# Patient Record
Sex: Female | Born: 1965
Health system: Southern US, Community
[De-identification: ages and names within clinical notes are randomized; demographics above are authoritative.]

## PROBLEM LIST (undated history)

## (undated) DIAGNOSIS — N823 Fistula of vagina to large intestine: Secondary | ICD-10-CM

## (undated) DIAGNOSIS — M1990 Unspecified osteoarthritis, unspecified site: Secondary | ICD-10-CM

## (undated) DIAGNOSIS — N189 Chronic kidney disease, unspecified: Secondary | ICD-10-CM

## (undated) DIAGNOSIS — D649 Anemia, unspecified: Secondary | ICD-10-CM

## (undated) DIAGNOSIS — R569 Unspecified convulsions: Secondary | ICD-10-CM

## (undated) DIAGNOSIS — J45909 Unspecified asthma, uncomplicated: Secondary | ICD-10-CM

## (undated) DIAGNOSIS — F419 Anxiety disorder, unspecified: Secondary | ICD-10-CM

## (undated) DIAGNOSIS — I639 Cerebral infarction, unspecified: Secondary | ICD-10-CM

## (undated) DIAGNOSIS — E119 Type 2 diabetes mellitus without complications: Secondary | ICD-10-CM

## (undated) DIAGNOSIS — R0600 Dyspnea, unspecified: Secondary | ICD-10-CM

## (undated) DIAGNOSIS — R519 Headache, unspecified: Secondary | ICD-10-CM

## (undated) DIAGNOSIS — C539 Malignant neoplasm of cervix uteri, unspecified: Secondary | ICD-10-CM

## (undated) DIAGNOSIS — I10 Essential (primary) hypertension: Secondary | ICD-10-CM

## (undated) DIAGNOSIS — R51 Headache: Secondary | ICD-10-CM

## (undated) DIAGNOSIS — F32A Depression, unspecified: Secondary | ICD-10-CM

## (undated) DIAGNOSIS — F329 Major depressive disorder, single episode, unspecified: Secondary | ICD-10-CM

## (undated) DIAGNOSIS — Z86010 Personal history of colonic polyps: Secondary | ICD-10-CM

## (undated) DIAGNOSIS — I2510 Atherosclerotic heart disease of native coronary artery without angina pectoris: Secondary | ICD-10-CM

## (undated) DIAGNOSIS — R011 Cardiac murmur, unspecified: Secondary | ICD-10-CM

## (undated) HISTORY — PX: TONSILLECTOMY: SUR1361

## (undated) HISTORY — PX: CARDIAC CATHETERIZATION: SHX172

---

## 2005-03-28 ENCOUNTER — Ambulatory Visit: Payer: Self-pay | Admitting: Psychiatry

## 2005-03-28 ENCOUNTER — Inpatient Hospital Stay (HOSPITAL_COMMUNITY): Admission: RE | Admit: 2005-03-28 | Discharge: 2005-03-31 | Payer: Self-pay | Admitting: Psychiatry

## 2012-01-11 ENCOUNTER — Encounter: Payer: Self-pay | Admitting: Internal Medicine

## 2016-12-13 DIAGNOSIS — J9601 Acute respiratory failure with hypoxia: Secondary | ICD-10-CM

## 2016-12-13 DIAGNOSIS — T50901A Poisoning by unspecified drugs, medicaments and biological substances, accidental (unintentional), initial encounter: Secondary | ICD-10-CM

## 2016-12-13 DIAGNOSIS — J690 Pneumonitis due to inhalation of food and vomit: Secondary | ICD-10-CM

## 2017-02-19 ENCOUNTER — Inpatient Hospital Stay
Admission: AD | Admit: 2017-02-19 | Discharge: 2017-02-25 | DRG: 881 | Disposition: A | Discharge status: Home / Self Care | Payer: Federal, State, Local not specified - Other | Source: Other Acute Inpatient Hospital | Attending: Psychiatry | Admitting: Psychiatry

## 2017-02-19 DIAGNOSIS — E1122 Type 2 diabetes mellitus with diabetic chronic kidney disease: Secondary | ICD-10-CM | POA: Diagnosis present

## 2017-02-19 DIAGNOSIS — I10 Essential (primary) hypertension: Secondary | ICD-10-CM

## 2017-02-19 DIAGNOSIS — X838XXA Intentional self-harm by other specified means, initial encounter: Secondary | ICD-10-CM | POA: Diagnosis not present

## 2017-02-19 DIAGNOSIS — F17200 Nicotine dependence, unspecified, uncomplicated: Secondary | ICD-10-CM

## 2017-02-19 DIAGNOSIS — Z811 Family history of alcohol abuse and dependence: Secondary | ICD-10-CM | POA: Diagnosis not present

## 2017-02-19 DIAGNOSIS — I679 Cerebrovascular disease, unspecified: Secondary | ICD-10-CM | POA: Diagnosis present

## 2017-02-19 DIAGNOSIS — F329 Major depressive disorder, single episode, unspecified: Secondary | ICD-10-CM | POA: Diagnosis present

## 2017-02-19 DIAGNOSIS — Z7982 Long term (current) use of aspirin: Secondary | ICD-10-CM | POA: Diagnosis not present

## 2017-02-19 DIAGNOSIS — Z818 Family history of other mental and behavioral disorders: Secondary | ICD-10-CM

## 2017-02-19 DIAGNOSIS — F419 Anxiety disorder, unspecified: Secondary | ICD-10-CM | POA: Diagnosis present

## 2017-02-19 DIAGNOSIS — F1120 Opioid dependence, uncomplicated: Secondary | ICD-10-CM | POA: Diagnosis present

## 2017-02-19 DIAGNOSIS — F1520 Other stimulant dependence, uncomplicated: Secondary | ICD-10-CM | POA: Diagnosis present

## 2017-02-19 DIAGNOSIS — I2510 Atherosclerotic heart disease of native coronary artery without angina pectoris: Secondary | ICD-10-CM | POA: Diagnosis present

## 2017-02-19 DIAGNOSIS — T1491XA Suicide attempt, initial encounter: Secondary | ICD-10-CM | POA: Diagnosis present

## 2017-02-19 DIAGNOSIS — N189 Chronic kidney disease, unspecified: Secondary | ICD-10-CM | POA: Diagnosis present

## 2017-02-19 DIAGNOSIS — Z915 Personal history of self-harm: Secondary | ICD-10-CM

## 2017-02-19 DIAGNOSIS — F1320 Sedative, hypnotic or anxiolytic dependence, uncomplicated: Secondary | ICD-10-CM

## 2017-02-19 DIAGNOSIS — M1990 Unspecified osteoarthritis, unspecified site: Secondary | ICD-10-CM | POA: Diagnosis present

## 2017-02-19 DIAGNOSIS — Z8673 Personal history of transient ischemic attack (TIA), and cerebral infarction without residual deficits: Secondary | ICD-10-CM | POA: Diagnosis not present

## 2017-02-19 DIAGNOSIS — F4310 Post-traumatic stress disorder, unspecified: Secondary | ICD-10-CM | POA: Diagnosis present

## 2017-02-19 DIAGNOSIS — G4700 Insomnia, unspecified: Secondary | ICD-10-CM | POA: Diagnosis present

## 2017-02-19 DIAGNOSIS — Z79899 Other long term (current) drug therapy: Secondary | ICD-10-CM

## 2017-02-19 DIAGNOSIS — J45909 Unspecified asthma, uncomplicated: Secondary | ICD-10-CM | POA: Diagnosis present

## 2017-02-19 DIAGNOSIS — F17210 Nicotine dependence, cigarettes, uncomplicated: Secondary | ICD-10-CM | POA: Diagnosis present

## 2017-02-19 DIAGNOSIS — F1020 Alcohol dependence, uncomplicated: Secondary | ICD-10-CM | POA: Diagnosis present

## 2017-02-19 DIAGNOSIS — F13239 Sedative, hypnotic or anxiolytic dependence with withdrawal, unspecified: Secondary | ICD-10-CM | POA: Diagnosis present

## 2017-02-19 DIAGNOSIS — F13939 Sedative, hypnotic or anxiolytic use, unspecified with withdrawal, unspecified: Secondary | ICD-10-CM

## 2017-02-19 DIAGNOSIS — Z8669 Personal history of other diseases of the nervous system and sense organs: Secondary | ICD-10-CM

## 2017-02-19 DIAGNOSIS — Z888 Allergy status to other drugs, medicaments and biological substances status: Secondary | ICD-10-CM

## 2017-02-19 DIAGNOSIS — I129 Hypertensive chronic kidney disease with stage 1 through stage 4 chronic kidney disease, or unspecified chronic kidney disease: Secondary | ICD-10-CM | POA: Diagnosis present

## 2017-02-19 DIAGNOSIS — R51 Headache: Secondary | ICD-10-CM | POA: Diagnosis present

## 2017-02-19 DIAGNOSIS — Z882 Allergy status to sulfonamides status: Secondary | ICD-10-CM

## 2017-02-19 DIAGNOSIS — E119 Type 2 diabetes mellitus without complications: Secondary | ICD-10-CM

## 2017-02-19 HISTORY — DX: Headache: R51

## 2017-02-19 HISTORY — DX: Essential (primary) hypertension: I10

## 2017-02-19 HISTORY — DX: Cardiac murmur, unspecified: R01.1

## 2017-02-19 HISTORY — DX: Headache, unspecified: R51.9

## 2017-02-19 HISTORY — DX: Atherosclerotic heart disease of native coronary artery without angina pectoris: I25.10

## 2017-02-19 HISTORY — DX: Unspecified convulsions: R56.9

## 2017-02-19 HISTORY — DX: Cerebral infarction, unspecified: I63.9

## 2017-02-19 HISTORY — DX: Unspecified osteoarthritis, unspecified site: M19.90

## 2017-02-19 HISTORY — DX: Dyspnea, unspecified: R06.00

## 2017-02-19 HISTORY — DX: Depression, unspecified: F32.A

## 2017-02-19 HISTORY — DX: Chronic kidney disease, unspecified: N18.9

## 2017-02-19 HISTORY — DX: Major depressive disorder, single episode, unspecified: F32.9

## 2017-02-19 HISTORY — DX: Anxiety disorder, unspecified: F41.9

## 2017-02-19 HISTORY — DX: Type 2 diabetes mellitus without complications: E11.9

## 2017-02-19 HISTORY — DX: Unspecified asthma, uncomplicated: J45.909

## 2017-02-19 HISTORY — DX: Anemia, unspecified: D64.9

## 2017-02-19 LAB — GLUCOSE, CAPILLARY: Glucose-Capillary: 121 mg/dL — ABNORMAL HIGH (ref 65–99)

## 2017-02-19 MED ORDER — AMLODIPINE BESYLATE 5 MG PO TABS
2.5000 mg | ORAL_TABLET | Freq: Every day | ORAL | Status: DC
  Administered 2017-02-20: 2.5 mg via ORAL
  Filled 2017-02-19: qty 1

## 2017-02-19 MED ORDER — ALUM & MAG HYDROXIDE-SIMETH 200-200-20 MG/5ML PO SUSP
30.0000 mL | ORAL | Status: DC | PRN
  Administered 2017-02-20 – 2017-02-24 (×4): 30 mL via ORAL
  Filled 2017-02-19 (×4): qty 30

## 2017-02-19 MED ORDER — CHLORDIAZEPOXIDE HCL 25 MG PO CAPS
50.0000 mg | ORAL_CAPSULE | Freq: Three times a day (TID) | ORAL | Status: DC
  Administered 2017-02-20 – 2017-02-21 (×6): 50 mg via ORAL
  Filled 2017-02-19 (×6): qty 2

## 2017-02-19 MED ORDER — LORAZEPAM 2 MG PO TABS: 2.0000 mg | ORAL_TABLET | Freq: Three times a day (TID) | ORAL | Status: DC | PRN

## 2017-02-19 MED ORDER — TRAZODONE HCL 100 MG PO TABS
100.0000 mg | ORAL_TABLET | Freq: Every evening | ORAL | Status: DC | PRN
  Administered 2017-02-19: 100 mg via ORAL
  Filled 2017-02-19: qty 1

## 2017-02-19 MED ORDER — ASPIRIN EC 81 MG PO TBEC
81.0000 mg | DELAYED_RELEASE_TABLET | Freq: Every day | ORAL | Status: DC
  Administered 2017-02-20 – 2017-02-25 (×6): 81 mg via ORAL
  Filled 2017-02-19 (×7): qty 1

## 2017-02-19 MED ORDER — MAGNESIUM HYDROXIDE 400 MG/5ML PO SUSP
30.0000 mL | Freq: Every day | ORAL | Status: DC | PRN
  Administered 2017-02-19: 30 mL via ORAL
  Filled 2017-02-19: qty 30

## 2017-02-19 MED ORDER — GABAPENTIN 300 MG PO CAPS
300.0000 mg | ORAL_CAPSULE | Freq: Three times a day (TID) | ORAL | Status: DC
  Administered 2017-02-20 – 2017-02-21 (×5): 300 mg via ORAL
  Filled 2017-02-19 (×5): qty 1

## 2017-02-19 MED ORDER — METOPROLOL TARTRATE 25 MG PO TABS
50.0000 mg | ORAL_TABLET | Freq: Two times a day (BID) | ORAL | Status: DC
  Administered 2017-02-19 – 2017-02-25 (×12): 50 mg via ORAL
  Filled 2017-02-19 (×12): qty 2

## 2017-02-19 MED ORDER — ACETAMINOPHEN 325 MG PO TABS
650.0000 mg | ORAL_TABLET | Freq: Four times a day (QID) | ORAL | Status: DC | PRN
  Administered 2017-02-19 – 2017-02-21 (×4): 650 mg via ORAL
  Filled 2017-02-19 (×4): qty 2

## 2017-02-19 NOTE — BH Assessment (Signed)
Per Dr. Dwyane Dee, patient meets inpatient criteria and will be transferred to [facility]. This was discussed during the morning Bridge Call. Patient's information was forwarded to [facility] Attending Physician (Dr. Jerilee Hoh) and [facility] Charge Nurse [place] F.).
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
## 2017-02-19 NOTE — BH Assessment (Signed)
Patient has been accepted to [facility].  Accepting physician is Dr. Dwyane Dee.  Attending Physician will be Dr. Jerilee Hoh.  Patient has been assigned to room 323, by [place].  Call report to [phone].  Representative/Transfer Coordinator is Virgina Organ Patient pre-admitted by [facility] Patient Access Larena Glassman)   [facility] Staff Romie Minus, TTS) made aware of acceptance.

## 2017-02-20 ENCOUNTER — Encounter: Payer: Self-pay | Admitting: Psychiatry

## 2017-02-20 DIAGNOSIS — F1320 Sedative, hypnotic or anxiolytic dependence, uncomplicated: Secondary | ICD-10-CM

## 2017-02-20 DIAGNOSIS — F13239 Sedative, hypnotic or anxiolytic dependence with withdrawal, unspecified: Secondary | ICD-10-CM

## 2017-02-20 DIAGNOSIS — E119 Type 2 diabetes mellitus without complications: Secondary | ICD-10-CM

## 2017-02-20 DIAGNOSIS — F1520 Other stimulant dependence, uncomplicated: Secondary | ICD-10-CM

## 2017-02-20 DIAGNOSIS — F332 Major depressive disorder, recurrent severe without psychotic features: Secondary | ICD-10-CM

## 2017-02-20 DIAGNOSIS — F17200 Nicotine dependence, unspecified, uncomplicated: Secondary | ICD-10-CM

## 2017-02-20 DIAGNOSIS — F1020 Alcohol dependence, uncomplicated: Secondary | ICD-10-CM

## 2017-02-20 DIAGNOSIS — I10 Essential (primary) hypertension: Secondary | ICD-10-CM

## 2017-02-20 DIAGNOSIS — I679 Cerebrovascular disease, unspecified: Secondary | ICD-10-CM

## 2017-02-20 DIAGNOSIS — F1120 Opioid dependence, uncomplicated: Secondary | ICD-10-CM

## 2017-02-20 DIAGNOSIS — F13939 Sedative, hypnotic or anxiolytic use, unspecified with withdrawal, unspecified: Secondary | ICD-10-CM

## 2017-02-20 LAB — URINALYSIS, COMPLETE (UACMP) WITH MICROSCOPIC
Bacteria, UA: NONE SEEN
Bilirubin Urine: NEGATIVE
GLUCOSE, UA: NEGATIVE mg/dL
HGB URINE DIPSTICK: NEGATIVE
KETONES UR: NEGATIVE mg/dL
LEUKOCYTES UA: NEGATIVE
Nitrite: NEGATIVE
PH: 7 (ref 5.0–8.0)
PROTEIN: NEGATIVE mg/dL
RBC / HPF: NONE SEEN RBC/hpf (ref 0–5)
Specific Gravity, Urine: 1.008 (ref 1.005–1.030)

## 2017-02-20 LAB — BASIC METABOLIC PANEL
Anion gap: 10 (ref 5–15)
BUN: 14 mg/dL (ref 6–20)
CHLORIDE: 103 mmol/L (ref 101–111)
CO2: 27 mmol/L (ref 22–32)
Calcium: 9.5 mg/dL (ref 8.9–10.3)
Creatinine, Ser: 0.79 mg/dL (ref 0.44–1.00)
Glucose, Bld: 163 mg/dL — ABNORMAL HIGH (ref 65–99)
POTASSIUM: 4.1 mmol/L (ref 3.5–5.1)
Sodium: 140 mmol/L (ref 135–145)

## 2017-02-20 LAB — CBC WITH DIFFERENTIAL/PLATELET
Basophils Absolute: 0.1 10*3/uL (ref 0–0.1)
Basophils Relative: 1 %
EOS ABS: 0.1 10*3/uL (ref 0–0.7)
EOS PCT: 1 %
HCT: 43.6 % (ref 35.0–47.0)
HEMOGLOBIN: 14.7 g/dL (ref 12.0–16.0)
Lymphocytes Relative: 26 %
Lymphs Abs: 2.5 10*3/uL (ref 1.0–3.6)
MCH: 30.7 pg (ref 26.0–34.0)
MCHC: 33.8 g/dL (ref 32.0–36.0)
MCV: 90.8 fL (ref 80.0–100.0)
Monocytes Absolute: 0.5 10*3/uL (ref 0.2–0.9)
Monocytes Relative: 6 %
NEUTROS PCT: 66 %
Neutro Abs: 6.3 10*3/uL (ref 1.4–6.5)
PLATELETS: 293 10*3/uL (ref 150–440)
RBC: 4.8 MIL/uL (ref 3.80–5.20)
RDW: 13.9 % (ref 11.5–14.5)
WBC: 9.5 10*3/uL (ref 3.6–11.0)

## 2017-02-20 LAB — TSH: TSH: 0.209 u[IU]/mL — AB (ref 0.350–4.500)

## 2017-02-20 MED ORDER — AMLODIPINE BESYLATE 5 MG PO TABS
10.0000 mg | ORAL_TABLET | Freq: Every day | ORAL | Status: DC
  Administered 2017-02-21 – 2017-02-25 (×5): 10 mg via ORAL
  Filled 2017-02-20 (×5): qty 2

## 2017-02-20 MED ORDER — AMLODIPINE BESYLATE 5 MG PO TABS
7.5000 mg | ORAL_TABLET | Freq: Once | ORAL | Status: AC
  Administered 2017-02-20: 7.5 mg via ORAL
  Filled 2017-02-20: qty 2

## 2017-02-20 MED ORDER — TRAZODONE HCL 50 MG PO TABS
150.0000 mg | ORAL_TABLET | Freq: Every evening | ORAL | Status: DC | PRN
  Administered 2017-02-20 – 2017-02-21 (×2): 150 mg via ORAL
  Filled 2017-02-20 (×2): qty 1

## 2017-02-20 MED ORDER — NICOTINE 21 MG/24HR TD PT24
21.0000 mg | MEDICATED_PATCH | Freq: Every day | TRANSDERMAL | Status: DC
  Administered 2017-02-20 – 2017-02-25 (×6): 21 mg via TRANSDERMAL
  Filled 2017-02-20 (×6): qty 1

## 2017-02-20 MED ORDER — CLONIDINE HCL 0.1 MG PO TABS
0.1000 mg | ORAL_TABLET | Freq: Three times a day (TID) | ORAL | Status: DC | PRN
  Administered 2017-02-20 – 2017-02-21 (×2): 0.1 mg via ORAL
  Filled 2017-02-20 (×2): qty 1

## 2017-02-20 MED ORDER — FLUOXETINE HCL 20 MG PO CAPS
20.0000 mg | ORAL_CAPSULE | Freq: Every day | ORAL | Status: DC
  Administered 2017-02-20 – 2017-02-25 (×6): 20 mg via ORAL
  Filled 2017-02-20 (×6): qty 1

## 2017-02-20 NOTE — Plan of Care (Signed)
Problem: Safety: Goal: Ability to remain free from injury will improve Outcome: Progressing Pt injury free during hospital stay.

## 2017-02-20 NOTE — Plan of Care (Signed)
Problem: Activity: Goal: Sleeping patterns will improve Outcome: Progressing Patient slept for Estimated Hours of 7.15; every 15 minutes safety round maintained, no injury or falls during this shift.
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
## 2017-02-20 NOTE — Progress Notes (Signed)
Urine cup handed to pt .Marland Kitchen Pt stated she will urinate in cup when she can. Stated she will bring sample up to nurses station when finished.

## 2017-02-20 NOTE — Tx Team (Signed)
Initial Treatment Plan 02/20/2017 1:23 AM Kristen Murphy ACZ:[id number]    PATIENT STRESSORS: Financial difficulties Health problems Medication change or noncompliance Substance abuse   PATIENT STRENGTHS: Ability for insight Average or above average intelligence Capable of independent living Motivation for treatment/growth Supportive family/friends   PATIENT IDENTIFIED PROBLEMS: Suicidal Thoughts  Mood Instability & Bereavements  PolySubstance & Etoh Abuse  Relationship Conflicts               DISCHARGE CRITERIA:  Adequate post-discharge living arrangements Improved stabilization in mood, thinking, and/or behavior Reduction of life-threatening or endangering symptoms to within safe limits Verbal commitment to aftercare and medication compliance  PRELIMINARY DISCHARGE PLAN: Attend aftercare/continuing care group Outpatient therapy Return to previous living arrangement  PATIENT/FAMILY INVOLVEMENT: This treatment plan has been presented to and reviewed with the patient, Kristen Murphy.  The patient and family have been given the opportunity to ask questions and make suggestions.  Electa Sniff, RN 02/20/2017, 1:23 AM

## 2017-02-20 NOTE — BHH Group Notes (Signed)
[place] LCSW Group Therapy   02/20/2017  9:30 am   Type of Therapy: Group Therapy   Participation Level: Patient invited but did not attend.    Glorious Peach, MSW, LCSWA 02/20/2017, 2:13PM
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
## 2017-02-20 NOTE — Progress Notes (Signed)
Patient ID: Kristen Murphy, female   DOB: 04/18/1966, 51 y.o.   MRN: [id number] Admitted from [facility] IVC for increasing suicidal thoughts, heavy polysubstance abuse, OD on opiates recently requiring resuscitation, patient abuses Benzo and other  medications and stealing from others; still going through grief &  bereavements over the dead of 3 close relatives in the last 6 months. Skin assessment and Contraband Search completed, gross skin intact, no contrabands found on patient and in his belongings. Receptive, pleasant, no impairment in thoughts and processes, some moments of crying spells telling her story, denied SI now but have reasons, means, plans to carry out suicidal plans; patient contracted not to kill herself during this hospitalization. Unit orientation and room completed after snacks and medications.

## 2017-02-20 NOTE — Tx Team (Signed)
Interdisciplinary Treatment and Diagnostic Plan Update  02/20/2017 Time of Session: 1140 Kristen Murphy MRN: [id number]  Principal Diagnosis: MDD (major depressive disorder)  Secondary Diagnoses: Principal Problem:   MDD (major depressive disorder) Active Problems:   HTN (hypertension)   DM (diabetes mellitus) ([place])   Cerebrovascular disease   Tobacco use disorder   Opioid use disorder, severe, dependence (HCC)   Sedative, hypnotic or anxiolytic use disorder, severe, dependence (HCC)   Sedative hypnotic withdrawal ([place])   Methamphetamine use disorder, moderate (HCC)   Alcohol use disorder, moderate, dependence ([place])   Current Medications:  Current Facility-Administered Medications  Medication Dose Route Frequency Provider Last Rate Last Dose  . acetaminophen (TYLENOL) tablet 650 mg  650 mg Oral Q6H PRN Hildred Priest, MD   650 mg at 02/19/17 2126  . alum & mag hydroxide-simeth (MAALOX/MYLANTA) 200-200-20 MG/5ML suspension 30 mL  30 mL Oral Q4H PRN Hildred Priest, MD   30 mL at 02/20/17 1511  . [START ON 02/21/2017] amLODipine (NORVASC) tablet 10 mg  10 mg Oral Daily Hildred Priest, MD      . aspirin EC tablet 81 mg  81 mg Oral Daily Hildred Priest, MD   81 mg at 02/20/17 0829  . chlordiazePOXIDE (LIBRIUM) capsule 50 mg  50 mg Oral TID Hildred Priest, MD   50 mg at 02/20/17 1314  . cloNIDine (CATAPRES) tablet 0.1 mg  0.1 mg Oral TID PRN Hildred Priest, MD   0.1 mg at 02/20/17 1511  . FLUoxetine (PROZAC) capsule 20 mg  20 mg Oral Daily Hildred Priest, MD   20 mg at 02/20/17 1317  . gabapentin (NEURONTIN) capsule 300 mg  300 mg Oral TID Hildred Priest, MD   300 mg at 02/20/17 1314  . magnesium hydroxide (MILK OF MAGNESIA) suspension 30 mL  30 mL Oral Daily PRN Hildred Priest, MD   30 mL at 02/19/17 2125  . metoprolol tartrate (LOPRESSOR) tablet 50 mg  50 mg Oral BID  Hildred Priest, MD   50 mg at 02/20/17 0830  . nicotine (NICODERM CQ - dosed in mg/24 hours) patch 21 mg  21 mg Transdermal Daily Hildred Priest, MD   21 mg at 02/20/17 0907  . traZODone (DESYREL) tablet 150 mg  150 mg Oral QHS PRN Hildred Priest, MD       PTA Medications: Prescriptions Prior to Admission  Medication Sig Dispense Refill Last Dose  . amLODipine (NORVASC) 10 MG tablet Take 10 mg by mouth daily.   02/19/2017 at 1000  . aspirin EC 81 MG tablet Take 81 mg by mouth daily.   02/19/2017 at 1000  . gabapentin (NEURONTIN) 300 MG capsule Take 900 mg by mouth 3 (three) times daily.   02/19/2017 at 1200  . metoprolol tartrate (LOPRESSOR) 100 MG tablet Take 100 mg by mouth 2 (two) times daily.   02/19/2017 at 1000    Patient Stressors: Financial difficulties Health problems Medication change or noncompliance Substance abuse  Patient Strengths: Ability for insight Average or above average intelligence Capable of independent living Motivation for treatment/growth Supportive family/friends  Treatment Modalities: Medication Management, Group therapy, Case management,  1 to 1 session with clinician, Psychoeducation, Recreational therapy.   Physician Treatment Plan for Primary Diagnosis: MDD (major depressive disorder) Long Term Goal(s): Improvement in symptoms so as ready for discharge Improvement in symptoms so as ready for discharge   Short Term Goals: Ability to identify changes in lifestyle to reduce recurrence of condition will improve Ability to verbalize feelings will improve Ability  to demonstrate self-control will improve Ability to identify and develop effective coping behaviors will improve Ability to identify triggers associated with substance abuse/mental health issues will improve Compliance with prescribed medications will improve  Medication Management: Evaluate patient's response, side effects, and tolerance of medication  regimen.  Therapeutic Interventions: 1 to 1 sessions, Unit Group sessions and Medication administration.  Evaluation of Outcomes: Not Met  Physician Treatment Plan for Secondary Diagnosis: Principal Problem:   MDD (major depressive disorder) Active Problems:   HTN (hypertension)   DM (diabetes mellitus) ([place])   Cerebrovascular disease   Tobacco use disorder   Opioid use disorder, severe, dependence (HCC)   Sedative, hypnotic or anxiolytic use disorder, severe, dependence ([place])   Sedative hypnotic withdrawal ([place])   Methamphetamine use disorder, moderate (HCC)   Alcohol use disorder, moderate, dependence ([place])  Long Term Goal(s): Improvement in symptoms so as ready for discharge Improvement in symptoms so as ready for discharge   Short Term Goals: Ability to identify changes in lifestyle to reduce recurrence of condition will improve Ability to verbalize feelings will improve Ability to demonstrate self-control will improve Ability to identify and develop effective coping behaviors will improve Ability to identify triggers associated with substance abuse/mental health issues will improve Compliance with prescribed medications will improve     Medication Management: Evaluate patient's response, side effects, and tolerance of medication regimen.  Therapeutic Interventions: 1 to 1 sessions, Unit Group sessions and Medication administration.  Evaluation of Outcomes: Not Met   RN Treatment Plan for Primary Diagnosis: MDD (major depressive disorder) Long Term Goal(s): Knowledge of disease and therapeutic regimen to maintain health will improve  Short Term Goals: Ability to participate in decision making will improve, Ability to identify and develop effective coping behaviors will improve and Compliance with prescribed medications will improve  Medication Management: RN will administer medications as ordered by provider, will assess and evaluate patient's response and provide  education to patient for prescribed medication. RN will report any adverse and/or side effects to prescribing provider.  Therapeutic Interventions: 1 on 1 counseling sessions, Psychoeducation, Medication administration, Evaluate responses to treatment, Monitor vital signs and CBGs as ordered, Perform/monitor CIWA, COWS, AIMS and Fall Risk screenings as ordered, Perform wound care treatments as ordered.  Evaluation of Outcomes: Not Met   LCSW Treatment Plan for Primary Diagnosis: MDD (major depressive disorder) Long Term Goal(s): Safe transition to appropriate next level of care at discharge, Engage patient in therapeutic group addressing interpersonal concerns.  Short Term Goals: Engage patient in aftercare planning with referrals and resources and Increase skills for wellness and recovery  Therapeutic Interventions: Assess for all discharge needs, 1 to 1 time with Social worker, Explore available resources and support systems, Assess for adequacy in community support network, Educate family and significant other(s) on suicide prevention, Complete Psychosocial Assessment, Interpersonal group therapy.  Evaluation of Outcomes: Not Met   Progress in Treatment: Attending groups: No. Participating in groups: No. Taking medication as prescribed: Yes. Toleration medication: Yes. Family/Significant other contact made: No, will contact:  sister Patient understands diagnosis: Yes. Discussing patient identified problems/goals with staff: Yes. Medical problems stabilized or resolved: Yes. Denies suicidal/homicidal ideation: Yes. Issues/concerns per patient self-inventory: No. Other: none  New problem(s) identified: No, Describe:  none  New Short Term/Long Term Goal(s): Pt goal is "to feel better about myself"  Discharge Plan or Barriers: Pt will be referred to outpatient services at [facility].  Reason for Continuation of Hospitalization: Depression Medication  stabilization  Estimated Length of Stay:  3-5 days.  Attendees: Patient:Kristen Murphy 02/20/2017   Physician: Dr. Jerilee Hoh, MD 02/20/2017   Nursing: Polly Cobia, RN 02/20/2017   RN Care Manager: 02/20/2017   Social Worker: Lurline Idol, LCSW 02/20/2017   Recreational Therapist:  02/20/2017   Other:  02/20/2017   Other:  02/20/2017   Other: 02/20/2017        Scribe for Treatment Team: Joanne Chars, [place] 02/20/2017 3:31 PM

## 2017-02-20 NOTE — Progress Notes (Signed)
This morning pt was tearful pt stated, " I was just thinking about my child's father who recently passed away. After he left this world I lost it, I keep asking God why he didn't take me too".Pt reports having depression feeling hopeless because her loved ones has passed away. She reports that made her relapse on drugs. Pt denies current SI, HI, a/v hallucinations. Commits to safety on unit. Pt is being monitored for withdrawal symptoms and hypertension. Pt compliant with all scheduled meds given prn clonidine BP 165/74 75 pulse. Will continue to monitor.

## 2017-02-20 NOTE — BHH Suicide Risk Assessment (Signed)
[facility] Admission Suicide Risk Assessment   Nursing information obtained from:  Patient, Review of record Demographic factors:  Divorced or widowed, Caucasian, Low socioeconomic status, Unemployed Current Mental Status:  Suicide plan, Intention to act on suicide plan, Belief that plan would result in death Loss Factors:  Loss of significant relationship, Financial problems / change in socioeconomic status Historical Factors:  Prior suicide attempts, Family history of mental illness or substance abuse Risk Reduction Factors:  Positive therapeutic relationship  Total Time spent with patient: 1 hour Principal Problem: MDD (major depressive disorder) Diagnosis:   Patient Active Problem List   Diagnosis Date Noted  . HTN (hypertension) [I10] 02/20/2017  . DM (diabetes mellitus) ([place]) [E11.9] 02/20/2017  . Cerebrovascular disease [I67.9] 02/20/2017  . Tobacco use disorder [F17.200] 02/20/2017  . Opioid use disorder, severe, dependence ([place]) [F11.20] 02/20/2017  . Sedative, hypnotic or anxiolytic use disorder, severe, dependence ([place]) [F13.20] 02/20/2017  . Sedative hypnotic withdrawal ([place]) [F13.239] 02/20/2017  . Methamphetamine use disorder, moderate ([place]) [F15.20] 02/20/2017  . Alcohol use disorder, moderate, dependence ([place]) [F10.20] 02/20/2017  . MDD (major depressive disorder) [F32.9] 02/19/2017   Subjective Data:   Continued Clinical Symptoms:  Alcohol Use Disorder Identification Test Final Score (AUDIT): 23 The "Alcohol Use Disorders Identification Test", Guidelines for Use in Primary Care, Second Edition.  World Pharmacologist [facility]). Score between 0-7:  no or low risk or alcohol related problems. Score between 8-15:  moderate risk of alcohol related problems. Score between 16-19:  high risk of alcohol related problems. Score 20 or above:  warrants further diagnostic evaluation for alcohol dependence and treatment.   CLINICAL FACTORS:   Severe Anxiety and/or  Agitation Alcohol/Substance Abuse/Dependencies More than one psychiatric diagnosis Previous Psychiatric Diagnoses and Treatments      Psychiatric Specialty Exam: Physical Exam  ROS  Blood pressure (!) 173/94, pulse 71, temperature 97.9 F (36.6 C), temperature source Oral, resp. rate 16, height 5\' 2"  (1.575 m), weight 82.6 kg (182 lb), SpO2 98 %.Body mass index is 33.29 kg/m.                                                    Sleep:  Number of Hours: 7.15      COGNITIVE FEATURES THAT CONTRIBUTE TO RISK:  Closed-mindedness    SUICIDE RISK:   Moderate:  Frequent suicidal ideation with limited intensity, and duration, some specificity in terms of plans, no associated intent, good self-control, limited dysphoria/symptomatology, some risk factors present, and identifiable protective factors, including available and accessible social support.  PLAN OF CARE: admit to [facility]  I certify that inpatient services furnished can reasonably be expected to improve the patient's condition.   Hildred Priest, MD 02/20/2017, 12:38 PM

## 2017-02-21 MED ORDER — GABAPENTIN 400 MG PO CAPS
400.0000 mg | ORAL_CAPSULE | Freq: Three times a day (TID) | ORAL | Status: DC
  Administered 2017-02-21 – 2017-02-25 (×11): 400 mg via ORAL
  Filled 2017-02-21 (×11): qty 1

## 2017-02-21 MED ORDER — CLONIDINE HCL 0.1 MG PO TABS
0.1000 mg | ORAL_TABLET | Freq: Two times a day (BID) | ORAL | Status: DC
  Administered 2017-02-21 – 2017-02-25 (×8): 0.1 mg via ORAL
  Filled 2017-02-21 (×8): qty 1

## 2017-02-21 NOTE — BHH Suicide Risk Assessment (Addendum)
[place] INPATIENT:  Family/Significant Other Suicide Prevention Education  Suicide Prevention Education:  Contact Attempts: Johny Blamer, daughter, [phone], has been identified by the patient as the family member/significant other with whom the patient will be residing, and identified as the person(s) who will aid the patient in the event of a mental health crisis.  With written consent from the patient, two attempts were made to provide suicide prevention education, prior to and/or following the patient's discharge.  We were unsuccessful in providing suicide prevention education.  A suicide education pamphlet was given to the patient to share with family/significant other.  Date and time of first attempt:02/21/17, 59 Date and time of second attempt:02/22/17, 1052  Joanne Chars, LCSW 02/21/2017, 2:33 PM

## 2017-02-21 NOTE — Plan of Care (Signed)
Problem: Coping: Goal: Ability to verbalize frustrations and anger appropriately will improve Outcome: Progressing Patient is able to verbalize feelings to staff.

## 2017-02-21 NOTE — Progress Notes (Signed)
Pt appears bright, happy this morning. Voices optimism regarding the day stating that she feels much better, less depressed today. Reports good sleep last night with the help of sleep medication, good appetite, normal energy, good concentration. Rates depression 2/10, hopelessness 1/10, anxiety 7.5/10 (low 0-10 high). Denies SI/HI/AVH. Complained of headache with relief from taking tylenol and clonidine PRN as ordered. Goal today is "to have an even better day today than yesterday, focus on coping skills" by "go to groups, talk with counselor and doctor." Attends group. Appropriately interacts with staff/peers. Medication compliant. Support and encouragement provided. Medications administered as ordered with education. Safety maintained with every 15 minute checks. Will continue to monitor.

## 2017-02-21 NOTE — Progress Notes (Signed)
D: Pt denies SI/HI/AVH. Pt is pleasant and cooperative with treatment plan, affect is bright, thoughts are organized no distress noted. Patient  is interacting with peers and staff appropriately.  A: Pt was offered support and encouragement. Pt was given scheduled medications. Pt was encouraged to attend groups. Q 15 minute checks were done for safety.  R:Pt attends groups and interacts well with peers and staff. Pt is taking medication. Pt has no complaints.Pt receptive to treatment and safety maintained on unit.

## 2017-02-21 NOTE — BHH Group Notes (Signed)
[place] Group Notes:  (Nursing/MHT/Case Management/Adjunct)  Date:  02/21/2017  Time:  4:01 AM  Type of Therapy:  Psychoeducational Skills  Participation Level:  Active  Participation Quality:  Appropriate, Attentive, Sharing and Supportive  Affect:  Anxious and Excited  Cognitive:  Alert, Appropriate and Oriented  Insight:  Good  Engagement in Group:  Engaged and Supportive  Modes of Intervention:  Discussion and Exploration  Summary of Progress/Problems:  Kathi Ludwig 02/21/2017, 4:01 AM

## 2017-02-21 NOTE — BHH Group Notes (Signed)
[place] Group Notes:  (Nursing/MHT/Case Management/Adjunct)  Date:  02/21/2017  Time:  10:40 PM  Type of Therapy:  Group Therapy  Participation Level:  Active  Participation Quality:  Appropriate  Affect:  Appropriate  Cognitive:  Appropriate  Insight:  Appropriate  Engagement in Group:  Engaged  Modes of Intervention:  Discussion  Summary of Progress/Problems:  Kandis Fantasia 02/21/2017, 10:40 PM

## 2017-02-21 NOTE — Plan of Care (Signed)
Problem: Physical Regulation: Goal: Complications related to the disease process, condition or treatment will be avoided or minimized Outcome: Progressing No withdrawal symptoms reported or observed today. Librium given as ordered. Education provided.

## 2017-02-21 NOTE — BHH Group Notes (Signed)
[facility] [place] LCSW Group Therapy  02/21/2017 2:51 PM  Type of Therapy:  Group Therapy  Participation Level:  Active  Participation Quality:  Appropriate and Sharing  Affect:  Appropriate  Cognitive:  Appropriate  Insight:  Developing/Improving  Engagement in Therapy:  Developing/Improving  Modes of Intervention:  Activity, Discussion, Education, Problem-solving, Reality Testing, Socialization and Support  Summary of Progress/Problems: Balance in life: Patients will discuss the concept of balance and how it looks and feels to be unbalanced. Pt will identify areas in their life that is unbalanced and ways to become more balanced. They discussed what aspects in their lives has influenced their self care. Patients also discussed self care in the areas of self regulation/control, hygiene/appearance, sleep/relaxation, healthy leisure, healthy eating habits, exercise, inner peace/spirituality, self improvement, sobriety, and health management. They were challenged to identify changes that are needed in order to improve self care.  Kemba Hoppes G. [place], [place] 02/21/2017, 2:51 PM

## 2017-02-21 NOTE — Progress Notes (Addendum)
[facility] MD Progress Note  02/21/2017 12:58 PM Bronda LOELLA HICKLE  MRN:  [id number] Subjective:   Patient is a 51 year old Caucasian female with history of major depressive disorder and substance abuse. She presented voluntarily to [facility] emergency department last week due to suicidality.  Patient reports that she has severe addiction to benzodiazepines, opiates alcohol and methamphetamines for more than 30 years. Over the last 4 years she managed to stay sober. She unfortunately relapsed in November 2017. At that time she broke up with her boyfriend because her boyfriend was smoking methamphetamines. They have been together for 6 years and he asked her to move out of the house. The patient had to move in with her aunt who apparently is prescribed with alprazolam. The patient said that she's been through a lot since last year. She had multiple deaths in her family and with friends who overdosed on opiates. She says her best friend die in February of an overdose. The father of her older children passed away recently as well. She says that he was the love of her life. Her mother had multiple strokes in February. She says that since then she's been feeling more depressed, she relapsed on drugs and now she is feeling very guilty about the relapse, she feels hopeless and helpless, she does not eat much or sleep well. She said the last week she started having thoughts about suicide and was planning on overdosing on benzodiazepines, opiates and alcohol. She says she consumed large amounts of for this drugs but nothing happened to her.  5/31 patient says she feels much better. She likes taking clonidine for anxiety. She is requesting to have the clonidine as an scheduled medication for her blood pressure and to manage her anxiety. She says she has been is sleeping well, her mood is much improved, she denies suicidality, homicidality. She denies having major problems with sleep, appetite, energy or concentration. She  feels that the withdrawals are well-controlled with Librium.  Per nursing: D: Pt denies SI/HI/AVH. Pt is pleasant and cooperative with treatment plan, affect is bright, thoughts are organized no distress noted. Patient  is interacting with peers and staff appropriately.  A: Pt was offered support and encouragement. Pt was given scheduled medications. Pt was encouraged to attend groups. Q 15 minute checks were done for safety.  R:Pt attends groups and interacts well with peers and staff. Pt is taking medication. Pt has no complaints.Pt receptive to treatment and safety maintained on unit.  Principal Problem: MDD (major depressive disorder) Diagnosis:   Patient Active Problem List   Diagnosis Date Noted  . HTN (hypertension) [I10] 02/20/2017  . DM (diabetes mellitus) ([place]) [E11.9] 02/20/2017  . Cerebrovascular disease [I67.9] 02/20/2017  . Tobacco use disorder [F17.200] 02/20/2017  . Opioid use disorder, severe, dependence ([place]) [F11.20] 02/20/2017  . Sedative, hypnotic or anxiolytic use disorder, severe, dependence ([place]) [F13.20] 02/20/2017  . Sedative hypnotic withdrawal ([place]) [F13.239] 02/20/2017  . Methamphetamine use disorder, moderate ([place]) [F15.20] 02/20/2017  . Alcohol use disorder, moderate, dependence ([place]) [F10.20] 02/20/2017  . MDD (major depressive disorder) [F32.9] 02/19/2017   Total Time spent with patient: 30 minutes  Past Psychiatric History: She has been hospitalized twice before. One time in the 90s at behavioral health in [place] and in 2014 at [facility] she denies any suicidal attempts  Past Medical History:  Past Medical History:  Diagnosis Date  . Anemia   . Anxiety   . Arthritis   . Asthma   . Chronic  kidney disease   . Coronary artery disease   . Depression   . Diabetes mellitus without complication ([place])   . Dyspnea   . Headache   . Heart murmur   . Hypertension   . Seizures ([place])   . Stroke [facility])     Past Surgical History:  Procedure  Laterality Date  . TONSILLECTOMY     Family History: History reviewed. No pertinent family history.  Family Psychiatric  History: Reports that her father mother were alcoholics. Says that her father had manic depression. Apparently her maternal grandfather committed suicide  Social History: Patient has 3 adult children and 2 grandchildren. She is close to her children. She used to work as a Engineer, structural. She is now applying for disability. Denies having any legal trouble in the past. She is single never married History  Alcohol use Not on file     History  Drug Use  . Types: Cocaine, Benzodiazepines, Amphetamines, "Crack" cocaine, Marijuana, Opium, Methylphenidate, Heroin    Social History   Social History  . Marital status: Married    Spouse name: N/A  . Number of children: N/A  . Years of education: N/A   Social History Main Topics  . Smoking status: Current Every Day Smoker    Packs/day: 3.00    Years: 38.00  . Smokeless tobacco: Never Used  . Alcohol use None  . Drug use: Yes    Types: Cocaine, Benzodiazepines, Amphetamines, "Crack" cocaine, Marijuana, Opium, Methylphenidate, Heroin  . Sexual activity: No   Other Topics Concern  . None   Social History Narrative  . None   Additional Social History:        Current Medications: Current Facility-Administered Medications  Medication Dose Route Frequency Provider Last Rate Last Dose  . acetaminophen (TYLENOL) tablet 650 mg  650 mg Oral Q6H PRN Hildred Priest, MD   650 mg at 02/21/17 0957  . alum & mag hydroxide-simeth (MAALOX/MYLANTA) 200-200-20 MG/5ML suspension 30 mL  30 mL Oral Q4H PRN Hildred Priest, MD   30 mL at 02/20/17 1511  . amLODipine (NORVASC) tablet 10 mg  10 mg Oral Daily Hildred Priest, MD   10 mg at 02/21/17 2202  . aspirin EC tablet 81 mg  81 mg Oral Daily Hildred Priest, MD   81 mg at 02/21/17 5427  . chlordiazePOXIDE (LIBRIUM) capsule 50 mg  50 mg  Oral TID Hildred Priest, MD   50 mg at 02/21/17 1130  . cloNIDine (CATAPRES) tablet 0.1 mg  0.1 mg Oral BID Hildred Priest, MD      . FLUoxetine (PROZAC) capsule 20 mg  20 mg Oral Daily Hildred Priest, MD   20 mg at 02/21/17 0623  . gabapentin (NEURONTIN) capsule 400 mg  400 mg Oral TID Hildred Priest, MD      . magnesium hydroxide (MILK OF MAGNESIA) suspension 30 mL  30 mL Oral Daily PRN Hildred Priest, MD   30 mL at 02/19/17 2125  . metoprolol tartrate (LOPRESSOR) tablet 50 mg  50 mg Oral BID Hildred Priest, MD   50 mg at 02/21/17 7628  . nicotine (NICODERM CQ - dosed in mg/24 hours) patch 21 mg  21 mg Transdermal Daily Hildred Priest, MD   21 mg at 02/21/17 0814  . traZODone (DESYREL) tablet 150 mg  150 mg Oral QHS PRN Hildred Priest, MD   150 mg at 02/20/17 2209    Lab Results:  Results for orders placed or performed during the hospital encounter of 02/19/17 (from  the past 48 hour(s))  Glucose, capillary     Status: Abnormal   Collection Time: 02/19/17  8:42 PM  Result Value Ref Range   Glucose-Capillary 121 (H) 65 - 99 mg/dL  CBC with Differential/Platelet     Status: None   Collection Time: 02/20/17 12:44 PM  Result Value Ref Range   WBC 9.5 3.6 - 11.0 K/uL   RBC 4.80 3.80 - 5.20 MIL/uL   Hemoglobin 14.7 12.0 - 16.0 g/dL   HCT 43.6 35.0 - 47.0 %   MCV 90.8 80.0 - 100.0 fL   MCH 30.7 26.0 - 34.0 pg   MCHC 33.8 32.0 - 36.0 g/dL   RDW 13.9 11.5 - 14.5 %   Platelets 293 150 - 440 K/uL   Neutrophils Relative % 66 %   Neutro Abs 6.3 1.4 - 6.5 K/uL   Lymphocytes Relative 26 %   Lymphs Abs 2.5 1.0 - 3.6 K/uL   Monocytes Relative 6 %   Monocytes Absolute 0.5 0.2 - 0.9 K/uL   Eosinophils Relative 1 %   Eosinophils Absolute 0.1 0 - 0.7 K/uL   Basophils Relative 1 %   Basophils Absolute 0.1 0 - 0.1 K/uL  Basic metabolic panel     Status: Abnormal   Collection Time: 02/20/17 12:44 PM   Result Value Ref Range   Sodium 140 135 - 145 mmol/L   Potassium 4.1 3.5 - 5.1 mmol/L   Chloride 103 101 - 111 mmol/L   CO2 27 22 - 32 mmol/L   Glucose, Bld 163 (H) 65 - 99 mg/dL   BUN 14 6 - 20 mg/dL   Creatinine, Ser 0.79 0.44 - 1.00 mg/dL   Calcium 9.5 8.9 - 10.3 mg/dL   GFR calc non Af Amer >60 >60 mL/min   GFR calc Af Amer >60 >60 mL/min    Comment: (NOTE) The eGFR has been calculated using the CKD EPI equation. This calculation has not been validated in all clinical situations. eGFR's persistently <60 mL/min signify possible Chronic Kidney Disease.    Anion gap 10 5 - 15  TSH     Status: Abnormal   Collection Time: 02/20/17 12:44 PM  Result Value Ref Range   TSH 0.209 (L) 0.350 - 4.500 uIU/mL    Comment: Performed by a 3rd Generation assay with a functional sensitivity of <=0.01 uIU/mL.  Urinalysis, Complete w Microscopic     Status: Abnormal   Collection Time: 02/20/17  9:10 PM  Result Value Ref Range   Color, Urine STRAW (A) YELLOW   APPearance CLEAR (A) CLEAR   Specific Gravity, Urine 1.008 1.005 - 1.030   pH 7.0 5.0 - 8.0   Glucose, UA NEGATIVE NEGATIVE mg/dL   Hgb urine dipstick NEGATIVE NEGATIVE   Bilirubin Urine NEGATIVE NEGATIVE   Ketones, ur NEGATIVE NEGATIVE mg/dL   Protein, ur NEGATIVE NEGATIVE mg/dL   Nitrite NEGATIVE NEGATIVE   Leukocytes, UA NEGATIVE NEGATIVE   RBC / HPF NONE SEEN 0 - 5 RBC/hpf   WBC, UA 0-5 0 - 5 WBC/hpf   Bacteria, UA NONE SEEN NONE SEEN   Squamous Epithelial / LPF 0-5 (A) NONE SEEN    Blood Alcohol level:  No results found for: [facility]  Metabolic Disorder Labs: No results found for: HGBA1C, MPG No results found for: PROLACTIN No results found for: CHOL, TRIG, HDL, CHOLHDL, VLDL, LDLCALC  Physical Findings: AIMS:  , ,  ,  ,    CIWA:  CIWA-Ar Total: 0 COWS:  Musculoskeletal: Strength & Muscle Tone: within normal limits Gait & Station: normal Patient leans: N/A  Psychiatric Specialty Exam: Physical Exam   Constitutional: She is oriented to person, place, and time. She appears well-developed and well-nourished.  HENT:  Head: Normocephalic and atraumatic.  Eyes: EOM are normal.  Neck: Normal range of motion.  Respiratory: Effort normal.  Musculoskeletal: Normal range of motion.  Neurological: She is alert and oriented to person, place, and time.    Review of Systems  Constitutional: Negative.   HENT: Negative.   Eyes: Negative.   Respiratory: Negative.   Cardiovascular: Negative.   Gastrointestinal: Negative.   Genitourinary: Negative.   Musculoskeletal: Negative.   Skin: Negative.   Neurological: Negative.   Endo/Heme/Allergies: Negative.   Psychiatric/Behavioral: Positive for depression and substance abuse.    Blood pressure (!) 148/71, pulse 66, temperature 97.6 F (36.4 C), temperature source Oral, resp. rate 18, height _0  (1.575 m), weight 82.6 kg (182 lb), SpO2 98 %.Body mass index is 33.29 kg/m.  General Appearance: Well Groomed  Eye Contact:  Good  Speech:  Clear and Coherent  Volume:  Normal  Mood:  Euthymic  Affect:  Appropriate and Congruent  Thought Process:  Linear and Descriptions of Associations: Intact  Orientation:  Full (Time, Place, and Person)  Thought Content:  Hallucinations: None  Suicidal Thoughts:  No  Homicidal Thoughts:  No  Memory:  Immediate;   Good Recent;   Good Remote;   Good  Judgement:  Fair  Insight:  Fair  Psychomotor Activity:  Normal  Concentration:  Concentration: Good and Attention Span: Good  Recall:  Good  Fund of Knowledge:  Good  Language:  Good  Akathisia:  No  Handed:    AIMS (if indicated):     Assets:  Housing Physical Health Social Support  ADL's:  Intact  Cognition:  WNL  Sleep:  Number of Hours: 6.5     Treatment Plan Summary:  Patient is a 51 year old Caucasian female with major depressive disorder, PTSD and addiction. She presents to [place] after a suicidal attempt in the setting of relapsing have multiple  severe psychosocial stressors in her life  Major depressive disorder: continue  fluoxetine 20 mg a day  Anxiety patient is on gabapentin which will be increase to 429m 3 times a day  For insomnia: continue trazodone 150 mg by mouth daily at bedtime  Benzodiazepine withdrawal patient is currently on a Librium taper  Benzodiazepine, alcohol, methamphetamines, opiates use disorder severe: Patient states that she is not able to go to inpatient substance abuse treatment at this time as she needs to take care of her mother who suffer multiple strokes in February. She was to be discharged back to her mother's house and plans to [facility]  Hypertension the patient will be continued on Norvasc and metoprolol. Will add clonidine 0.1 mg po bid  Diabetes will check hemoglobin A1c  Cerebrovascular disease: continue managing BP  Tobacco use disorder: continue  nicotine patch 21 mg a day  Labs: will recheck TSH, T3 and T4 as TSH was low. Pending HbA1c  Disposition back to [facility]  Follow-up [facility]  Will likely d/c early next week  HHildred Priest MD 02/21/2017, 12:58 PM

## 2017-02-21 NOTE — BHH Group Notes (Signed)
[place] LCSW Group Therapy Note  Type of Therapy and Topic:  Group Therapy:  Goals Group: SMART Goals  Participation Level:  Patient attended group on this date. Patient participated in goal setting and was able to share openly with the group.   Description of Group:   The purpose of a daily goals group is to assist and guide patients in setting recovery/wellness-related goals.  The objective is to set goals as they relate to the crisis in which they were admitted. Patients will be using SMART goal modalities to set measurable goals.  Characteristics of realistic goals will be discussed and patients will be assisted in setting and processing how one will reach their goal. Facilitator will also assist patients in applying interventions and coping skills learned in psycho-education groups to the SMART goal and process how one will achieve defined goal.  Therapeutic Goals: -Patients will develop and document one goal related to or their crisis in which brought them into treatment. -Patients will be guided by LCSW using SMART goal setting modality in how to set a measurable, attainable, realistic and time sensitive goal.  -Patients will process barriers in reaching goal. -Patients will process interventions in how to overcome and successful in reaching goal.   Summary of Patient Progress:  Patient Goal: "I want to develop to new coping skills by the end of the day".    Therapeutic Modalities:   Motivational Interviewing Public relations account executive Therapy Crisis Intervention Model SMART goals setting  Jerman Tinnon G. [place], [facility] 02/21/2017 10:32 AM

## 2017-02-22 LAB — HEMOGLOBIN A1C
Hgb A1c MFr Bld: 6.8 % — ABNORMAL HIGH (ref 4.8–5.6)
MEAN PLASMA GLUCOSE: 148 mg/dL

## 2017-02-22 MED ORDER — AMLODIPINE BESYLATE 10 MG PO TABS: 10.0000 mg | ORAL_TABLET | Freq: Every day | ORAL | 0 refills | Status: DC

## 2017-02-22 MED ORDER — FLUOXETINE HCL 20 MG PO CAPS: 20.0000 mg | ORAL_CAPSULE | Freq: Every day | ORAL | 0 refills | Status: DC

## 2017-02-22 MED ORDER — CLONIDINE HCL 0.1 MG PO TABS: 0.1000 mg | ORAL_TABLET | Freq: Two times a day (BID) | ORAL | 0 refills | Status: DC

## 2017-02-22 MED ORDER — METFORMIN HCL 500 MG PO TABS
500.0000 mg | ORAL_TABLET | Freq: Two times a day (BID) | ORAL | Status: DC
  Administered 2017-02-22 – 2017-02-25 (×7): 500 mg via ORAL
  Filled 2017-02-22 (×7): qty 1

## 2017-02-22 MED ORDER — ASPIRIN 81 MG PO TBEC: 81.0000 mg | DELAYED_RELEASE_TABLET | Freq: Every day | ORAL | 0 refills | Status: DC

## 2017-02-22 MED ORDER — METFORMIN HCL 500 MG PO TABS: 500.0000 mg | ORAL_TABLET | Freq: Two times a day (BID) | ORAL | 0 refills | Status: DC

## 2017-02-22 MED ORDER — ACETAMINOPHEN 500 MG PO TABS: 1000.0000 mg | ORAL_TABLET | Freq: Four times a day (QID) | ORAL | Status: DC | PRN

## 2017-02-22 MED ORDER — GABAPENTIN 400 MG PO CAPS: 800.0000 mg | ORAL_CAPSULE | Freq: Two times a day (BID) | ORAL | 0 refills | Status: DC

## 2017-02-22 MED ORDER — METOPROLOL TARTRATE 50 MG PO TABS: 50.0000 mg | ORAL_TABLET | Freq: Two times a day (BID) | ORAL | 0 refills | Status: DC

## 2017-02-22 MED ORDER — IBUPROFEN 800 MG PO TABS
800.0000 mg | ORAL_TABLET | Freq: Three times a day (TID) | ORAL | Status: DC | PRN
Start: 2017-02-22 — End: 2017-02-25
  Administered 2017-02-22 – 2017-02-25 (×4): 800 mg via ORAL
  Filled 2017-02-22 (×4): qty 1

## 2017-02-22 MED ORDER — CHLORDIAZEPOXIDE HCL 25 MG PO CAPS
25.0000 mg | ORAL_CAPSULE | Freq: Four times a day (QID) | ORAL | Status: DC
  Administered 2017-02-22 – 2017-02-25 (×12): 25 mg via ORAL
  Filled 2017-02-22 (×13): qty 1

## 2017-02-22 MED ORDER — TRAZODONE HCL 150 MG PO TABS: 150.0000 mg | ORAL_TABLET | Freq: Every day | ORAL | 0 refills | Status: DC

## 2017-02-22 MED ORDER — TRAZODONE HCL 50 MG PO TABS
150.0000 mg | ORAL_TABLET | Freq: Every day | ORAL | Status: DC
  Administered 2017-02-22 – 2017-02-24 (×3): 150 mg via ORAL
  Filled 2017-02-22 (×3): qty 1

## 2017-02-22 NOTE — Progress Notes (Signed)
Recreation Therapy Notes  Date: 06.01.18 Time: 1:00 pm Location: Craft Room  Group Topic: Social Skills  Goal Area(s) Addresses:  Patient will effectively work with peers towards shared goal. Patient will identify skill used to make activity successful. Patient will identify how skills used during activity can be used to reach post d/c. goals.  Behavioral Response: Attentive, Interactive  Intervention: Life Boat  Activity: Patients were given a scenario that we were in [place] and we decided to get on a boat and explore. Patients were given a list of 16 people Jerline Pain, nurse, bus driver, etc.) who were on the boat exploring with [place]. While we were exploring, the boat sprung a leak and the patients were put in charge of where to put people. There are two life boats. One is bigger and faster that holds everyone in the room plus 8 people on the list and there is a raft that holds 8 people.  Education: LRT educated patients on healthy support systems.  Education Outcome: Acknowledges education/In group clarification offered  Clinical Observations/Feedback: Patient worked with peer towards shared goal. Patient used effective communication, problem solving, and Scientist, clinical (histocompatibility and immunogenetics). Patient contributed to group discussion by stating what skills she used in group, and why these skills are important.  Leonette Monarch, LRT/CTRS 02/22/2017 2:03 PM

## 2017-02-22 NOTE — Progress Notes (Signed)
[facility] MD Progress Note  02/22/2017 8:08 AM Kristen Murphy  MRN:  [id number] Subjective:   Patient is a 51 year old Caucasian female with history of major depressive disorder and substance abuse. She presented voluntarily to [facility] emergency department last week due to suicidality.  Patient reports that she has severe addiction to benzodiazepines, opiates alcohol and methamphetamines for more than 30 years. Over the last 4 years she managed to stay sober. She unfortunately relapsed in November 2017. At that time she broke up with her boyfriend because her boyfriend was smoking methamphetamines. They have been together for 6 years and he asked her to move out of the house. The patient had to move in with her aunt who apparently is prescribed with alprazolam. The patient said that she's been through a lot since last year. She had multiple deaths in her family and with friends who overdosed on opiates. She says her best friend die in February of an overdose. The father of her older children passed away recently as well. She says that he was the love of her life. Her mother had multiple strokes in February. She says that since then she's been feeling more depressed, she relapsed on drugs and now she is feeling very guilty about the relapse, she feels hopeless and helpless, she does not eat much or sleep well. She said the last week she started having thoughts about suicide and was planning on overdosing on benzodiazepines, opiates and alcohol. She says she consumed large amounts of for this drugs but nothing happened to her.  5/31 patient says she feels much better. She likes taking clonidine for anxiety. She is requesting to have the clonidine as an scheduled medication for her blood pressure and to manage her anxiety. She says she has been is sleeping well, her mood is much improved, she denies suicidality, homicidality. She denies having major problems with sleep, appetite, energy or concentration. She feels  that the withdrawals are well-controlled with Librium.  6/1 patient says she feels much better. She is very grateful for the treatment received. She is attending groups. She is interacting appropriately with peers. She is very tearful because she says she is going to miss some of the patients are going to be discharged today. She denies suicidality, homicidality or having auditory or visual hallucinations. She feels that the medications prescribed are very helpful. She denies any side effects. Her major concern is anxiety.  Per nursing: Pt appears bright, happy this morning. Voices optimism regarding the day stating that she feels much better, less depressed today. Reports good sleep last night with the help of sleep medication, good appetite, normal energy, good concentration. Rates depression 2/10, hopelessness 1/10, anxiety 7.5/10 (low 0-10 high). Denies SI/HI/AVH. Complained of headache with relief from taking tylenol and clonidine PRN as ordered. Goal today is "to have an even better day today than yesterday, focus on coping skills" by "go to groups, talk with counselor and doctor." Attends group. Appropriately interacts with staff/peers. Medication compliant. Support and encouragement provided. Medications administered as ordered with education. Safety maintained with every 15 minute checks. Will continue to monitor   Principal Problem: MDD (major depressive disorder) Diagnosis:   Patient Active Problem List   Diagnosis Date Noted  . HTN (hypertension) [I10] 02/20/2017  . DM (diabetes mellitus) ([place]) [E11.9] 02/20/2017  . Cerebrovascular disease [I67.9] 02/20/2017  . Tobacco use disorder [F17.200] 02/20/2017  . Opioid use disorder, severe, dependence ([place]) [F11.20] 02/20/2017  . Sedative, hypnotic or anxiolytic use disorder, severe, dependence (  [place]) [F13.20] 02/20/2017  . Sedative hypnotic withdrawal ([place]) [F13.239] 02/20/2017  . Methamphetamine use disorder, moderate ([place]) [F15.20]  02/20/2017  . Alcohol use disorder, moderate, dependence ([place]) [F10.20] 02/20/2017  . MDD (major depressive disorder) [F32.9] 02/19/2017   Total Time spent with patient: 30 minutes  Past Psychiatric History: She has been hospitalized twice before. One time in the 90s at behavioral health in [place] and in 2014 at [facility] she denies any suicidal attempts  Past Medical History:  Past Medical History:  Diagnosis Date  . Anemia   . Anxiety   . Arthritis   . Asthma   . Chronic kidney disease   . Coronary artery disease   . Depression   . Diabetes mellitus without complication ([place])   . Dyspnea   . Headache   . Heart murmur   . Hypertension   . Seizures ([place])   . Stroke [facility])     Past Surgical History:  Procedure Laterality Date  . TONSILLECTOMY     Family History: History reviewed. No pertinent family history.  Family Psychiatric  History: Reports that her father mother were alcoholics. Says that her father had manic depression. Apparently her maternal grandfather committed suicide  Social History: Patient has 3 adult children and 2 grandchildren. She is close to her children. She used to work as a Engineer, structural. She is now applying for disability. Denies having any legal trouble in the past. She is single never married History  Alcohol use Not on file     History  Drug Use  . Types: Cocaine, Benzodiazepines, Amphetamines, "Crack" cocaine, Marijuana, Opium, Methylphenidate, Heroin    Social History   Social History  . Marital status: Married    Spouse name: N/A  . Number of children: N/A  . Years of education: N/A   Social History Main Topics  . Smoking status: Current Every Day Smoker    Packs/day: 3.00    Years: 38.00  . Smokeless tobacco: Never Used  . Alcohol use None  . Drug use: Yes    Types: Cocaine, Benzodiazepines, Amphetamines, "Crack" cocaine, Marijuana, Opium, Methylphenidate, Heroin  . Sexual activity: No   Other Topics Concern  .  None   Social History Narrative  . None   Additional Social History:        Current Medications: Current Facility-Administered Medications  Medication Dose Route Frequency Provider Last Rate Last Dose  . acetaminophen (TYLENOL) tablet 650 mg  650 mg Oral Q6H PRN Hildred Priest, MD   650 mg at 02/21/17 1818  . alum & mag hydroxide-simeth (MAALOX/MYLANTA) 200-200-20 MG/5ML suspension 30 mL  30 mL Oral Q4H PRN Hildred Priest, MD   30 mL at 02/20/17 1511  . amLODipine (NORVASC) tablet 10 mg  10 mg Oral Daily Hildred Priest, MD   10 mg at 02/21/17 3710  . aspirin EC tablet 81 mg  81 mg Oral Daily Hildred Priest, MD   81 mg at 02/21/17 6269  . chlordiazePOXIDE (LIBRIUM) capsule 25 mg  25 mg Oral QID Hildred Priest, MD      . cloNIDine (CATAPRES) tablet 0.1 mg  0.1 mg Oral BID Hildred Priest, MD   0.1 mg at 02/21/17 1723  . FLUoxetine (PROZAC) capsule 20 mg  20 mg Oral Daily Hildred Priest, MD   20 mg at 02/21/17 4854  . gabapentin (NEURONTIN) capsule 400 mg  400 mg Oral TID Hildred Priest, MD   400 mg at 02/21/17 1723  . magnesium hydroxide (MILK OF  MAGNESIA) suspension 30 mL  30 mL Oral Daily PRN Hildred Priest, MD   30 mL at 02/19/17 2125  . metoprolol tartrate (LOPRESSOR) tablet 50 mg  50 mg Oral BID Hildred Priest, MD   50 mg at 02/21/17 1723  . nicotine (NICODERM CQ - dosed in mg/24 hours) patch 21 mg  21 mg Transdermal Daily Hildred Priest, MD   21 mg at 02/21/17 8756  . traZODone (DESYREL) tablet 150 mg  150 mg Oral QHS Hildred Priest, MD        Lab Results:  Results for orders placed or performed during the hospital encounter of 02/19/17 (from the past 48 hour(s))  CBC with Differential/Platelet     Status: None   Collection Time: 02/20/17 12:44 PM  Result Value Ref Range   WBC 9.5 3.6 - 11.0 K/uL   RBC 4.80 3.80 - 5.20 MIL/uL   Hemoglobin  14.7 12.0 - 16.0 g/dL   HCT 43.6 35.0 - 47.0 %   MCV 90.8 80.0 - 100.0 fL   MCH 30.7 26.0 - 34.0 pg   MCHC 33.8 32.0 - 36.0 g/dL   RDW 13.9 11.5 - 14.5 %   Platelets 293 150 - 440 K/uL   Neutrophils Relative % 66 %   Neutro Abs 6.3 1.4 - 6.5 K/uL   Lymphocytes Relative 26 %   Lymphs Abs 2.5 1.0 - 3.6 K/uL   Monocytes Relative 6 %   Monocytes Absolute 0.5 0.2 - 0.9 K/uL   Eosinophils Relative 1 %   Eosinophils Absolute 0.1 0 - 0.7 K/uL   Basophils Relative 1 %   Basophils Absolute 0.1 0 - 0.1 K/uL  Basic metabolic panel     Status: Abnormal   Collection Time: 02/20/17 12:44 PM  Result Value Ref Range   Sodium 140 135 - 145 mmol/L   Potassium 4.1 3.5 - 5.1 mmol/L   Chloride 103 101 - 111 mmol/L   CO2 27 22 - 32 mmol/L   Glucose, Bld 163 (H) 65 - 99 mg/dL   BUN 14 6 - 20 mg/dL   Creatinine, Ser 0.79 0.44 - 1.00 mg/dL   Calcium 9.5 8.9 - 10.3 mg/dL   GFR calc non Af Amer >60 >60 mL/min   GFR calc Af Amer >60 >60 mL/min    Comment: (NOTE) The eGFR has been calculated using the CKD EPI equation. This calculation has not been validated in all clinical situations. eGFR's persistently <60 mL/min signify possible Chronic Kidney Disease.    Anion gap 10 5 - 15  TSH     Status: Abnormal   Collection Time: 02/20/17 12:44 PM  Result Value Ref Range   TSH 0.209 (L) 0.350 - 4.500 uIU/mL    Comment: Performed by a 3rd Generation assay with a functional sensitivity of <=0.01 uIU/mL.  Hemoglobin A1c     Status: Abnormal   Collection Time: 02/20/17 12:44 PM  Result Value Ref Range   Hgb A1c MFr Bld 6.8 (H) 4.8 - 5.6 %    Comment: (NOTE)         Pre-diabetes: 5.7 - 6.4         Diabetes: >6.4         Glycemic control for adults with diabetes: <7.0    Mean Plasma Glucose 148 mg/dL    Comment: (NOTE) Performed At: [facility] [address] [place], [place] [id number] Lindon Romp MD CZ:[id number]   Urinalysis, Complete w Microscopic     Status: Abnormal   Collection  Time: 02/20/17  9:10 PM  Result Value Ref Range   Color, Urine STRAW (A) YELLOW   APPearance CLEAR (A) CLEAR   Specific Gravity, Urine 1.008 1.005 - 1.030   pH 7.0 5.0 - 8.0   Glucose, UA NEGATIVE NEGATIVE mg/dL   Hgb urine dipstick NEGATIVE NEGATIVE   Bilirubin Urine NEGATIVE NEGATIVE   Ketones, ur NEGATIVE NEGATIVE mg/dL   Protein, ur NEGATIVE NEGATIVE mg/dL   Nitrite NEGATIVE NEGATIVE   Leukocytes, UA NEGATIVE NEGATIVE   RBC / HPF NONE SEEN 0 - 5 RBC/hpf   WBC, UA 0-5 0 - 5 WBC/hpf   Bacteria, UA NONE SEEN NONE SEEN   Squamous Epithelial / LPF 0-5 (A) NONE SEEN    Blood Alcohol level:  No results found for: [facility]  Metabolic Disorder Labs: Lab Results  Component Value Date   HGBA1C 6.8 (H) 02/20/2017   MPG 148 02/20/2017   No results found for: PROLACTIN No results found for: CHOL, TRIG, HDL, CHOLHDL, VLDL, LDLCALC  Physical Findings: AIMS:  , ,  ,  ,    CIWA:  CIWA-Ar Total: 0 COWS:     Musculoskeletal: Strength & Muscle Tone: within normal limits Gait & Station: normal Patient leans: N/A  Psychiatric Specialty Exam: Physical Exam  Constitutional: She is oriented to person, place, and time. She appears well-developed and well-nourished.  HENT:  Head: Normocephalic and atraumatic.  Eyes: EOM are normal.  Neck: Normal range of motion.  Respiratory: Effort normal.  Musculoskeletal: Normal range of motion.  Neurological: She is alert and oriented to person, place, and time.    Review of Systems  Constitutional: Negative.   HENT: Negative.   Eyes: Negative.   Respiratory: Negative.   Cardiovascular: Negative.   Gastrointestinal: Negative.   Genitourinary: Negative.   Musculoskeletal: Negative.   Skin: Negative.   Neurological: Negative.   Endo/Heme/Allergies: Negative.   Psychiatric/Behavioral: Positive for depression and substance abuse.    Blood pressure 140/68, pulse 64, temperature 97.6 F (36.4 C), temperature source Oral, resp. rate 18, height 5'  2" (1.575 m), weight 82.6 kg (182 lb), SpO2 100 %.Body mass index is 33.29 kg/m.  General Appearance: Well Groomed  Eye Contact:  Good  Speech:  Clear and Coherent  Volume:  Normal  Mood:  Euthymic  Affect:  Appropriate and Congruent  Thought Process:  Linear and Descriptions of Associations: Intact  Orientation:  Full (Time, Place, and Person)  Thought Content:  Hallucinations: None  Suicidal Thoughts:  No  Homicidal Thoughts:  No  Memory:  Immediate;   Good Recent;   Good Remote;   Good  Judgement:  Fair  Insight:  Fair  Psychomotor Activity:  Normal  Concentration:  Concentration: Good and Attention Span: Good  Recall:  Good  Fund of Knowledge:  Good  Language:  Good  Akathisia:  No  Handed:    AIMS (if indicated):     Assets:  Housing Physical Health Social Support  ADL's:  Intact  Cognition:  WNL  Sleep:  Number of Hours: 7     Treatment Plan Summary:  Patient is a 51 year old Caucasian female with major depressive disorder, PTSD and addiction. She presents to [place] after a suicidal attempt in the setting of relapsing have multiple severe psychosocial stressors in her life  Major depressive disorder: continue  fluoxetine 20 mg a day  Anxiety: continue gabapentin 476m 3 times a day--will try to discharge on 600 mg 3 times a day as patient says she is very anxious  and has been taking Xanax and she was 51 years old  For insomnia: continue trazodone 150 mg by mouth daily at bedtime  Benzodiazepine withdrawal patient is currently on a Librium taper---Librium 25 mg qid  Benzodiazepine, alcohol, methamphetamines, opiates use disorder severe: Patient states that she is not able to go to inpatient substance abuse treatment at this time as she needs to take care of her mother who suffer multiple strokes in February. She was to be discharged back to her mother's house and plans to [facility]  Hypertension the patient will be continued on  Norvasc, metoprolol and clonidine 0.1 mg po bid  Diabetes: elevated HbA1c will start metformin 500 mg po bid  Cerebrovascular disease: continue managing BP  Tobacco use disorder: continue  nicotine patch 21 mg a day  Labs: will recheck TSH, T3 and T4 as TSH was low. Pending HbA1c  Disposition back to [place]  [facility]-up [facility]  Will likely d/c early next week  Hildred Priest, MD 02/22/2017, 8:08 AM

## 2017-02-22 NOTE — Progress Notes (Signed)
Presents with bright affect.  Denies SI/HI/AVH.  Rates depression as 1/10 and anxiety as 2/10.  Verbalizes that coming here is the best thing that she did.  Patient talked about all the stressors that she had going on in her life that caused her to relapse.    Medication and group compliant.  Good appetite.  Maintaining personal care chores.  Support and encouragement offered.  Safety maintained.

## 2017-02-22 NOTE — [facility] Group Notes (Signed)
[place] LCSW Group Therapy  02/22/2017 11:45 AM  Type of Therapy:  Group Therapy  Participation Level:  Active  Participation Quality:  Appropriate and Sharing  Affect:  Appropriate  Cognitive:  Alert  Insight:  Developing/Improving  Engagement in Therapy:  Developing/Improving  Modes of Intervention:  Activity, Discussion, Education, Problem-solving, Reality Testing, Socialization and Support  Summary of Progress/Problems: Stress management: Patients defined and discussed the topic of stress and the related symptoms and triggers for stress. Patients identified healthy coping skills they would like to try during hospitalization and after discharge to manage stress in a healthy way. CSW offered insight to varying stress management techniques.   Tzivia Oneil G. Hitchcock, [place] 02/22/2017, 11:45 AM
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
## 2017-02-23 LAB — THYROID PANEL WITH TSH
Free Thyroxine Index: 1.4 (ref 1.2–4.9)
T3 Uptake Ratio: 25 % (ref 24–39)
T4, Total: 5.4 ug/dL (ref 4.5–12.0)
TSH: 1.22 u[IU]/mL (ref 0.450–4.500)

## 2017-02-23 NOTE — Progress Notes (Signed)
[facility] MD Progress Note  02/23/2017 2:26 PM Kristen Murphy  MRN:  [id number] Subjective:   Patient is a 51 year old Caucasian female with history of major depressive disorder and substance abuse. She presented voluntarily to [facility] emergency department last week due to suicidality.  Patient reports that she has severe addiction to benzodiazepines, opiates alcohol and methamphetamines for more than 30 years. Over the last 4 years she managed to stay sober.   6/2 patient says she feels much better.   states she " snapped"  before hospitalization , denies  Suicidal intent, states she is a Engineer, manufacturing. She feels that the medications prescribed are very helpful. She denies any side effects. Anxiety better. BP low, pt asymptomatic, will monitor.  Per nursing: Presents with bright affect.  Denies SI/HI/AVH.  Rates depression as 1/10 and anxiety as 2/10.  Verbalizes that coming here is the best thing that she did.  Patient talked about all the stressors that she had going on in her life that caused her to relapse.    Medication and group compliant.  Good appetite.  Maintaining personal care chores.  Principal Problem: MDD (major depressive disorder) Diagnosis:   Patient Active Problem List   Diagnosis Date Noted  . HTN (hypertension) [I10] 02/20/2017  . DM (diabetes mellitus) ([place]) [E11.9] 02/20/2017  . Cerebrovascular disease [I67.9] 02/20/2017  . Tobacco use disorder [F17.200] 02/20/2017  . Opioid use disorder, severe, dependence ([place]) [F11.20] 02/20/2017  . Sedative, hypnotic or anxiolytic use disorder, severe, dependence ([place]) [F13.20] 02/20/2017  . Sedative hypnotic withdrawal ([place]) [F13.239] 02/20/2017  . Methamphetamine use disorder, moderate ([place]) [F15.20] 02/20/2017  . Alcohol use disorder, moderate, dependence ([place]) [F10.20] 02/20/2017  . MDD (major depressive disorder) [F32.9] 02/19/2017   Total Time spent with patient: 30 minutes  Past Psychiatric History: She has been hospitalized  twice before. One time in the 90s at behavioral health in [place] and in 2014 at [facility] use she denies any suicidal attempts  Past Medical History:  Past Medical History:  Diagnosis Date  . Anemia   . Anxiety   . Arthritis   . Asthma   . Chronic kidney disease   . Coronary artery disease   . Depression   . Diabetes mellitus without complication ([place])   . Dyspnea   . Headache   . Heart murmur   . Hypertension   . Seizures ([place])   . Stroke [facility])     Past Surgical History:  Procedure Laterality Date  . TONSILLECTOMY     Family History: History reviewed. No pertinent family history.  Family Psychiatric  History: Reports that her father mother were alcoholics. Says that her father had manic depression. Apparently her maternal grandfather committed suicide  Social History: Patient has 3 adult children and 2 grandchildren. She is close to her children. She used to work as a Engineer, structural. She is now applying for disability. Denies having any legal trouble in the past. She is single never married History  Alcohol use Not on file     History  Drug Use  . Types: Cocaine, Benzodiazepines, Amphetamines, "Crack" cocaine, Marijuana, Opium, Methylphenidate, Heroin    Social History   Social History  . Marital status: Married    Spouse name: N/A  . Number of children: N/A  . Years of education: N/A   Social History Main Topics  . Smoking status: Current Every Day Smoker    Packs/day: 3.00    Years: 38.00  . Smokeless tobacco: Never Used  .  Alcohol use None  . Drug use: Yes    Types: Cocaine, Benzodiazepines, Amphetamines, "Crack" cocaine, Marijuana, Opium, Methylphenidate, Heroin  . Sexual activity: No   Other Topics Concern  . None   Social History Narrative  . None   Additional Social History:        Current Medications: Current Facility-Administered Medications  Medication Dose Route Frequency Provider Last Rate Last Dose  . acetaminophen (TYLENOL)  tablet 1,000 mg  1,000 mg Oral Q6H PRN Hildred Priest, MD      . alum & mag hydroxide-simeth (MAALOX/MYLANTA) 200-200-20 MG/5ML suspension 30 mL  30 mL Oral Q4H PRN Hildred Priest, MD   30 mL at 02/20/17 1511  . amLODipine (NORVASC) tablet 10 mg  10 mg Oral Daily Hildred Priest, MD   10 mg at 02/23/17 0825  . aspirin EC tablet 81 mg  81 mg Oral Daily Hildred Priest, MD   81 mg at 02/23/17 0829  . chlordiazePOXIDE (LIBRIUM) capsule 25 mg  25 mg Oral QID Hildred Priest, MD   25 mg at 02/23/17 1139  . cloNIDine (CATAPRES) tablet 0.1 mg  0.1 mg Oral BID Hildred Priest, MD   0.1 mg at 02/23/17 0824  . FLUoxetine (PROZAC) capsule 20 mg  20 mg Oral Daily Hildred Priest, MD   20 mg at 02/23/17 0825  . gabapentin (NEURONTIN) capsule 400 mg  400 mg Oral TID Hildred Priest, MD   400 mg at 02/23/17 1139  . ibuprofen (ADVIL,MOTRIN) tablet 800 mg  800 mg Oral TID PRN Hildred Priest, MD   800 mg at 02/22/17 1713  . magnesium hydroxide (MILK OF MAGNESIA) suspension 30 mL  30 mL Oral Daily PRN Hildred Priest, MD   30 mL at 02/19/17 2125  . metFORMIN (GLUCOPHAGE) tablet 500 mg  500 mg Oral BID WC Hildred Priest, MD   500 mg at 02/23/17 0829  . metoprolol tartrate (LOPRESSOR) tablet 50 mg  50 mg Oral BID Hildred Priest, MD   50 mg at 02/23/17 0825  . nicotine (NICODERM CQ - dosed in mg/24 hours) patch 21 mg  21 mg Transdermal Daily Hildred Priest, MD   21 mg at 02/23/17 2993  . traZODone (DESYREL) tablet 150 mg  150 mg Oral QHS Hildred Priest, MD   150 mg at 02/22/17 2104    Lab Results:  Results for orders placed or performed during the hospital encounter of 02/19/17 (from the past 48 hour(s))  Thyroid Panel With TSH     Status: None   Collection Time: 02/22/17  7:34 AM  Result Value Ref Range   TSH 1.220 0.450 - 4.500 uIU/mL   T4, Total 5.4 4.5 -  12.0 ug/dL   T3 Uptake Ratio 25 24 - 39 %   Free Thyroxine Index 1.4 1.2 - 4.9    Comment: (NOTE) Performed At: [facility] [place], [place] [id number] Lindon Romp MD [id number]     Blood Alcohol level:  No results found for: [facility]  Metabolic Disorder Labs: Lab Results  Component Value Date   HGBA1C 6.8 (H) 02/20/2017   MPG 148 02/20/2017   No results found for: PROLACTIN No results found for: CHOL, TRIG, HDL, CHOLHDL, VLDL, LDLCALC  Physical Findings: AIMS:  , ,  ,  ,    CIWA:  CIWA-Ar Total: 0 COWS:     Musculoskeletal: Strength & Muscle Tone: within normal limits Gait & Station: normal Patient leans: N/A  Psychiatric Specialty Exam: Physical Exam  Nursing note and  vitals reviewed. Constitutional: She is oriented to person, place, and time. She appears well-developed and well-nourished.  HENT:  Head: Normocephalic and atraumatic.  Eyes: EOM are normal.  Neck: Normal range of motion.  Respiratory: Effort normal.  Musculoskeletal: Normal range of motion.  Neurological: She is alert and oriented to person, place, and time.    Review of Systems  Constitutional: Negative.   HENT: Negative.   Eyes: Negative.   Respiratory: Negative.   Cardiovascular: Negative.   Gastrointestinal: Negative.   Genitourinary: Negative.   Musculoskeletal: Negative.   Skin: Negative.   Neurological: Negative.   Endo/Heme/Allergies: Negative.   Psychiatric/Behavioral: Positive for depression and substance abuse.    Blood pressure 140/76, pulse (!) 101, temperature 98 F (36.7 C), temperature source Oral, resp. rate 18, height 5\' 2"  (1.575 m), weight 82.6 kg (182 lb), SpO2 100 %.Body mass index is 33.29 kg/m.  General Appearance: Well Groomed  Eye Contact:  Good  Speech:  Clear and Coherent  Volume:  Normal  Mood:  Euthymic  Affect:  Appropriate and Congruent  Thought Process:  Linear and Descriptions of Associations: Intact  Orientation:  Full  (Time, Place, and Person)  Thought Content:  Hallucinations: None  Suicidal Thoughts:  No  Homicidal Thoughts:  No  Memory:  Immediate;   Good Recent;   Good Remote;   Good  Judgement:  Fair  Insight:  Fair  Psychomotor Activity:  Normal  Concentration:  Concentration: Good and Attention Span: Good  Recall:  Good  Fund of Knowledge:  Good  Language:  Good  Akathisia:  No  Handed:    AIMS (if indicated):     Assets:  Housing Physical Health Social Support  ADL's:  Intact  Cognition:  WNL  Sleep:  Number of Hours: 7     Treatment Plan Summary:  Patient is a 51 year old Caucasian female with major depressive disorder, PTSD and addiction. She presents to [place] after a suicidal attempt in the setting of relapsing have multiple severe psychosocial stressors in her life  Major depressive disorder: continue  fluoxetine 20 mg a day  Anxiety: continue gabapentin 400mg  3 times a day--will try to discharge on 600 mg 3 times a day as patient says she is very anxious and has been taking Xanax and she was 51 years old  For insomnia: continue trazodone 150 mg by mouth daily at bedtime  Benzodiazepine withdrawal patient is currently on a Librium taper---Librium 25 mg qid  Benzodiazepine, alcohol, methamphetamines, opiates use disorder severe: Patient states that she is not able to go to inpatient substance abuse treatment at this time as she needs to take care of her mother who suffer multiple strokes in February. She was to be discharged back to her mother's house and plans [facility]  Hypertension the patient will be continued on Norvasc, metoprolol and clonidine 0.1 mg po bid  Diabetes: elevated HbA1c will start metformin 500 mg po bid  Cerebrovascular disease: continue managing BP  Tobacco use disorder: continue  nicotine patch 21 mg a day  Labs: will recheck TSH, T3 and T4 as TSH was low. Pending HbA1c  Disposition back to  [place]  [facility]  Will likely d/c early next week  Lenward Chancellor, MD 02/23/2017, 2:26 PMPatient ID: [name], female   DOB: 07/06/1966, 51 y.o.   MRN: [id number]

## 2017-02-23 NOTE — Plan of Care (Signed)
Problem: Safety: Goal: Ability to remain free from injury will improve Outcome: Progressing Patient was friendly on approach and asked appropriate questions to Probation officer.  She easily engaged with peers and staff.  She denies having thoughts of self harm and contracts for safety.

## 2017-02-23 NOTE — Progress Notes (Signed)
Denies SI/HI/AVH.  Pleasant and cooperative.  Visible in the milieu.  Support and encouragement offered.  Safety maintained.

## 2017-02-23 NOTE — BHH Group Notes (Signed)
[facility] Group Notes:  (Nursing/MHT/Case Management/Adjunct)  Date:  02/23/2017  Time:  11:29 PM  Type of Therapy:  Psychoeducational Skills  Participation Level:  Active  Participation Quality:  Appropriate, Attentive, Sharing and Supportive  Affect:  Appropriate  Cognitive:  Appropriate  Insight:  Appropriate and Good  Engagement in Group:  Engaged  Modes of Intervention:  Discussion, Socialization and Support  Summary of Progress/Problems:  Reece Agar 02/23/2017, 11:29 PM

## 2017-02-23 NOTE — BHH Group Notes (Signed)
[place] LCSW Group Therapy  02/23/2017 3:10 PM  Type of Therapy:  Group Therapy  Participation Level:  Active  Participation Quality:  Appropriate, Monopolizing, Redirectable and Sharing  Affect:  Appropriate  Cognitive:  Alert  Insight:  Developing/Improving  Engagement in Therapy:  Developing/Improving  Modes of Intervention:  Activity, Discussion, Education, Problem-solving, Reality Testing, Socialization and Support  Summary of Progress/Problems:Boundaries: Patients defined boundaries and discussed the importance having clear boundaries within their relationships. Patients identified their own boundaries. Patients established limits and rules within relationships both personally and professionally. Patients discussed ways to create and/ or improve their personal boundaries and utilizing assertive communications skills.   Chandi Nicklin G. [place], [place] 02/23/2017, 3:10 PM

## 2017-02-24 NOTE — Progress Notes (Signed)
[facility] MD Progress Note  02/24/2017 2:15 PM Clemence PRESTON WEILL  MRN:  [id number] Subjective:   Patient is a 51 year old Caucasian female with history of major depressive disorder and substance abuse. She presented voluntarily to [facility] emergency department last week due to suicidality.  Patient reports that she has severe addiction to benzodiazepines, opiates alcohol and methamphetamines for more than 30 years. Over the last 4 years she managed to stay sober.   6/3 patient says she feels better, denies depression, denies SI/HI. Denies side effects of meds.   Anxiety better. BP better today. Per nursing: Denies SI/HI/AVH.  Pleasant and cooperative.  Visible in the milieu. Patient was friendly on approach and asked appropriate questions to Probation officer.  She easily engaged with peers and staff.  She denies having thoughts of self harm and contracts for safety.  Principal Problem: MDD (major depressive disorder) Diagnosis:   Patient Active Problem List   Diagnosis Date Noted  . HTN (hypertension) [I10] 02/20/2017  . DM (diabetes mellitus) ([place]) [E11.9] 02/20/2017  . Cerebrovascular disease [I67.9] 02/20/2017  . Tobacco use disorder [F17.200] 02/20/2017  . Opioid use disorder, severe, dependence ([place]) [F11.20] 02/20/2017  . Sedative, hypnotic or anxiolytic use disorder, severe, dependence ([place]) [F13.20] 02/20/2017  . Sedative hypnotic withdrawal ([place]) [F13.239] 02/20/2017  . Methamphetamine use disorder, moderate ([place]) [F15.20] 02/20/2017  . Alcohol use disorder, moderate, dependence ([place]) [F10.20] 02/20/2017  . MDD (major depressive disorder) [F32.9] 02/19/2017   Total Time spent with patient: 30 minutes  Past Psychiatric History: She has been hospitalized twice before. One time in the 90s at behavioral health in [place] and in 2014 at [place] [facility] she denies any suicidal attempts  Past Medical History:  Past Medical History:  Diagnosis Date  . Anemia   . Anxiety   . Arthritis   .  Asthma   . Chronic kidney disease   . Coronary artery disease   . Depression   . Diabetes mellitus without complication ([place])   . Dyspnea   . Headache   . Heart murmur   . Hypertension   . Seizures ([place])   . Stroke [facility])     Past Surgical History:  Procedure Laterality Date  . TONSILLECTOMY     Family History: History reviewed. No pertinent family history.  Family Psychiatric  History: Reports that her father mother were alcoholics. Says that her father had manic depression. Apparently her maternal grandfather committed suicide  Social History: Patient has 3 adult children and 2 grandchildren. She is close to her children. She used to work as a Engineer, structural. She is now applying for disability. Denies having any legal trouble in the past. She is single never married History  Alcohol use Not on file     History  Drug Use  . Types: Cocaine, Benzodiazepines, Amphetamines, "Crack" cocaine, Marijuana, Opium, Methylphenidate, Heroin    Social History   Social History  . Marital status: Married    Spouse name: N/A  . Number of children: N/A  . Years of education: N/A   Social History Main Topics  . Smoking status: Current Every Day Smoker    Packs/day: 3.00    Years: 38.00  . Smokeless tobacco: Never Used  . Alcohol use None  . Drug use: Yes    Types: Cocaine, Benzodiazepines, Amphetamines, "Crack" cocaine, Marijuana, Opium, Methylphenidate, Heroin  . Sexual activity: No   Other Topics Concern  . None   Social History Narrative  . None   Additional Social History:  Current Medications: Current Facility-Administered Medications  Medication Dose Route Frequency Provider Last Rate Last Dose  . acetaminophen (TYLENOL) tablet 1,000 mg  1,000 mg Oral Q6H PRN Hildred Priest, MD      . alum & mag hydroxide-simeth (MAALOX/MYLANTA) 200-200-20 MG/5ML suspension 30 mL  30 mL Oral Q4H PRN Hildred Priest, MD   30 mL at 02/24/17 1239  .  amLODipine (NORVASC) tablet 10 mg  10 mg Oral Daily Hildred Priest, MD   10 mg at 02/24/17 0804  . aspirin EC tablet 81 mg  81 mg Oral Daily Hildred Priest, MD   81 mg at 02/24/17 0804  . chlordiazePOXIDE (LIBRIUM) capsule 25 mg  25 mg Oral QID Hildred Priest, MD   25 mg at 02/24/17 1155  . cloNIDine (CATAPRES) tablet 0.1 mg  0.1 mg Oral BID Hildred Priest, MD   0.1 mg at 02/24/17 0805  . FLUoxetine (PROZAC) capsule 20 mg  20 mg Oral Daily Hildred Priest, MD   20 mg at 02/24/17 0805  . gabapentin (NEURONTIN) capsule 400 mg  400 mg Oral TID Hildred Priest, MD   400 mg at 02/24/17 1155  . ibuprofen (ADVIL,MOTRIN) tablet 800 mg  800 mg Oral TID PRN Hildred Priest, MD   800 mg at 02/24/17 0826  . magnesium hydroxide (MILK OF MAGNESIA) suspension 30 mL  30 mL Oral Daily PRN Hildred Priest, MD   30 mL at 02/19/17 2125  . metFORMIN (GLUCOPHAGE) tablet 500 mg  500 mg Oral BID WC Hildred Priest, MD   500 mg at 02/24/17 0804  . metoprolol tartrate (LOPRESSOR) tablet 50 mg  50 mg Oral BID Hildred Priest, MD   50 mg at 02/24/17 0805  . nicotine (NICODERM CQ - dosed in mg/24 hours) patch 21 mg  21 mg Transdermal Daily Hildred Priest, MD   21 mg at 02/24/17 0805  . traZODone (DESYREL) tablet 150 mg  150 mg Oral QHS Hildred Priest, MD   150 mg at 02/23/17 2105    Lab Results:  No results found for this or any previous visit (from the past 48 hour(s)).  Blood Alcohol level:  No results found for: [facility]  Metabolic Disorder Labs: Lab Results  Component Value Date   HGBA1C 6.8 (H) 02/20/2017   MPG 148 02/20/2017   No results found for: PROLACTIN No results found for: CHOL, TRIG, HDL, CHOLHDL, VLDL, LDLCALC  Physical Findings: AIMS:  , ,  ,  ,    CIWA:  CIWA-Ar Total: 0 COWS:     Musculoskeletal: Strength & Muscle Tone: within normal limits Gait & Station:  normal Patient leans: N/A  Psychiatric Specialty Exam: Physical Exam  Nursing note and vitals reviewed. Constitutional: She is oriented to person, place, and time. She appears well-developed and well-nourished.  HENT:  Head: Normocephalic and atraumatic.  Eyes: EOM are normal.  Neck: Normal range of motion.  Respiratory: Effort normal.  Musculoskeletal: Normal range of motion.  Neurological: She is alert and oriented to person, place, and time.    Review of Systems  Constitutional: Negative.   HENT: Negative.   Eyes: Negative.   Respiratory: Negative.   Cardiovascular: Negative.   Gastrointestinal: Negative.   Genitourinary: Negative.   Musculoskeletal: Negative.   Skin: Negative.   Neurological: Negative.   Endo/Heme/Allergies: Negative.   Psychiatric/Behavioral: Positive for depression and substance abuse.    Blood pressure 138/73, pulse 71, temperature 98.4 F (36.9 C), temperature source Oral, resp. rate 18, height 5\' 2"  (1.575 m), weight 82.6 kg (  182 lb), SpO2 98 %.Body mass index is 33.29 kg/m.  General Appearance: Well Groomed  Eye Contact:  Good  Speech:  Clear and Coherent  Volume:  Normal  Mood:  Euthymic  Affect:  Appropriate and Congruent  Thought Process:  Linear and Descriptions of Associations: Intact  Orientation:  Full (Time, Place, and Person)  Thought Content:  Hallucinations: None  Suicidal Thoughts:  No  Homicidal Thoughts:  No  Memory:  Immediate;   Good Recent;   Good Remote;   Good  Judgement:  Fair  Insight:  Fair  Psychomotor Activity:  Normal  Concentration:  Concentration: Good and Attention Span: Good  Recall:  Good  Fund of Knowledge:  Good  Language:  Good  Akathisia:  No  Handed:    AIMS (if indicated):     Assets:  Housing Physical Health Social Support  ADL's:  Intact  Cognition:  WNL  Sleep:  Number of Hours: 7.3     Treatment Plan Summary:  Patient is a 51 year old Caucasian female with major depressive disorder,  PTSD and addiction. She presents to [place] after a suicidal attempt in the setting of relapsing have multiple severe psychosocial stressors in her life  Major depressive disorder: continue  fluoxetine 20 mg a day  Anxiety: continue gabapentin 400mg  3 times a day--will try to discharge on 600 mg 3 times a day as patient says she is very anxious and has been taking Xanax and she was 51 years old  For insomnia: continue trazodone 150 mg by mouth daily at bedtime  Benzodiazepine withdrawal patient is currently on a Librium taper---Librium 25 mg qid  Benzodiazepine, alcohol, methamphetamines, opiates use disorder severe: Patient states that she is not able to go to inpatient substance abuse treatment at this time as she needs to take care of her mother who suffer multiple strokes in February. She was to be discharged back to her mother's house and plans to [facility]  Hypertension the patient will be continued on Norvasc, metoprolol and clonidine 0.1 mg po bid  Diabetes: elevated HbA1c will start metformin 500 mg po bid  Cerebrovascular disease: continue managing BP  Tobacco use disorder: continue  nicotine patch 21 mg a day  Labs: will recheck TSH, T3 and T4 as TSH was low. Pending HbA1c  Disposition back to [place]  [facility]up [facility]  Will likely d/c early next week  Lenward Chancellor, MD 02/24/2017, 2:15 PMPatient ID: [name], female   DOB: Jan 06, 1966, 51 y.o.   MRN: [id number] Patient ID: MIRAI GREENWOOD, female   DOB: 1965/09/25, 51 y.o.   MRN: [id number]

## 2017-02-24 NOTE — Plan of Care (Signed)
Problem: Education: Goal: Utilization of techniques to improve thought processes will improve Outcome: Progressing Patient spent time with peers and attended wrap up group.  She discussed feeling positive about discharge.  She reported to Probation officer that "noone can pick her up tomorrow from her family group and became anxious. She agrees to discuss this in the morning and did a good job of processing her feelings.

## 2017-02-24 NOTE — Progress Notes (Signed)
Patient pleasant, interactive with peers and staff and remained medication compliant. She stated that her mood was fine, stated that her goal for the day was to "Go Outside." She met her goal. Patient denied any thoughts of self harm and contracted for safety. Denied HI/AVH. Patient encouraged to verbalize any thoughts and/or concerns to nursing staff. She verbalized understanding.

## 2017-02-24 NOTE — BHH Group Notes (Signed)
[facility] LCSW Group Therapy  02/24/2017 2:45 PM  Type of Therapy:  Group Therapy  Participation Level:  Active  Participation Quality:  Attentive and Sharing  Affect:  Appropriate  Cognitive:  Alert  Insight:  Developing/Improving  Engagement in Therapy:  Developing/Improving  Modes of Intervention:  Activity, Discussion, Education, Problem-solving, Reality Testing, Socialization and Support  Summary of Progress/Problems: Developing Gratitude/Positive Thoughts- Group facilitator defined what gratitude is and how it relates to building healthy relationships. Patients were asked to write two journal entries on the following topics: "Five things I am grateful for" and "Having a positive attitude can change my life by". Patients shared their responses with the group and discussed how this can impact mental wellbeing. Patients developed understanding about the impact of thinking positively and how it can improve happiness and self-esteem.   Kristen Murphy G. [place], [place] 02/24/2017, 2:45 PM

## 2017-02-24 NOTE — Plan of Care (Signed)
Problem: Health Behavior/Discharge Planning: Goal: Ability to manage health-related needs will improve Outcome: Progressing Medication compliant.

## 2017-02-25 NOTE — Progress Notes (Signed)
  [facility] Adult Case Management Discharge Plan :  Will you be returning to the same living situation after discharge:  Yes,  returning home. At discharge, do you have transportation home?: Yes,  [facility] bus. Do you have the ability to pay for your medications: No.  Release of information consent forms completed and in the chart;  Patient's signature needed at discharge.  Patient to Follow up at: [facility] [place]. Go on 02/26/2017.   Why:  Please attend your hospital discharge appointment on Tuesday, 02/26/17, at 2:15pm.  Please bring photo ID, insurance card, and a copy of your hospital discharge paperwork. Contact information: [place] [address] [phone]        Marguerita Merles, MD. Go on 03/04/2017.   Specialty:  Family Medicine Why:  Your appointment is at 11:00. Please bring a list of your current medications to this appointment. Contact information: [place] [address] [phone]           Next level of care provider has access to [place] and Suicide Prevention discussed: Yes,  SPE completed with patient.  Have you used any form of tobacco in the last 30 days? (Cigarettes, Smokeless Tobacco, Cigars, and/or Pipes): Yes  Has patient been referred to the Quitline?: Yes, faxed on 02/25/2017  Patient has been referred for addiction treatment: Yes  Emilie Rutter, MSW, LCSW-A 02/25/2017, 12:03 PM

## 2017-02-25 NOTE — BHH Group Notes (Signed)
[facility] Group Notes:  (Nursing/MHT/Case Management/Adjunct)  Date:  02/25/2017  Time:  4:53 AM  Type of Therapy:  Group Therapy  Participation Level:  Active  Participation Quality:  Appropriate  Affect:  Appropriate  Cognitive:  Appropriate  Insight:  Appropriate  Engagement in Group:  Engaged  Modes of Intervention:  n/a  Summary of Progress/Problems:  Kristen Murphy 02/25/2017, 4:53 AM
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
## 2017-02-25 NOTE — Progress Notes (Signed)
Affect bright.  Denies SI/HI/AVH.   Discharge instructions given, verbalized understanding. Prescriptions and seven day supply of medications given.  Personal belongings returned.  Escorted off unit by this Probation officer to to doctors on call to meet courtesy car to be transported to bus stop so that can travel home.

## 2017-02-25 NOTE — BHH Group Notes (Signed)
[place] LCSW Group Therapy Note  Date/Time: 02/25/17, 0930  Type of Therapy and Topic:  Group Therapy:  Overcoming Obstacles  Participation Level:  active  Description of Group:    In this group patients will be encouraged to explore what they see as obstacles to their own wellness and recovery. They will be guided to discuss their thoughts, feelings, and behaviors related to these obstacles. The group will process together ways to cope with barriers, with attention given to specific choices patients can make. Each patient will be challenged to identify changes they are motivated to make in order to overcome their obstacles. This group will be process-oriented, with patients participating in exploration of their own experiences as well as giving and receiving support and challenge from other group members.  Therapeutic Goals: 1. Patient will identify personal and current obstacles as they relate to admission. 2. Patient will identify barriers that currently interfere with their wellness or overcoming obstacles.  3. Patient will identify feelings, thought process and behaviors related to these barriers. 4. Patient will identify two changes they are willing to make to overcome these obstacles:    Summary of Patient Progress: Pt identified short term obstacles include 3 recent deaths of friends/family and a recent relapse.  Pt identified a longer term obstacle of maintaining sobriety and recovery long term.  Pt was active in the group discussion regarding steps she can take to overcome these obstacles.  Good participation.      Therapeutic Modalities:   Cognitive Behavioral Therapy Solution Focused Therapy Motivational Interviewing Relapse Prevention Therapy  Lurline Idol, LCSW

## 2019-02-18 ENCOUNTER — Encounter (HOSPITAL_COMMUNITY): Payer: Self-pay | Admitting: General Practice

## 2019-02-18 ENCOUNTER — Inpatient Hospital Stay (HOSPITAL_COMMUNITY)
Admission: AD | Admit: 2019-02-18 | Discharge: 2019-02-20 | DRG: 247 | Disposition: A | Discharge status: Home / Self Care | Payer: Medicaid Other | Source: Other Acute Inpatient Hospital | Attending: Cardiology | Admitting: Cardiology

## 2019-02-18 ENCOUNTER — Other Ambulatory Visit: Payer: Self-pay

## 2019-02-18 DIAGNOSIS — J449 Chronic obstructive pulmonary disease, unspecified: Secondary | ICD-10-CM | POA: Diagnosis not present

## 2019-02-18 DIAGNOSIS — I679 Cerebrovascular disease, unspecified: Secondary | ICD-10-CM | POA: Diagnosis not present

## 2019-02-18 DIAGNOSIS — Z1159 Encounter for screening for other viral diseases: Secondary | ICD-10-CM

## 2019-02-18 DIAGNOSIS — Z8249 Family history of ischemic heart disease and other diseases of the circulatory system: Secondary | ICD-10-CM | POA: Diagnosis not present

## 2019-02-18 DIAGNOSIS — I10 Essential (primary) hypertension: Secondary | ICD-10-CM | POA: Diagnosis not present

## 2019-02-18 DIAGNOSIS — R011 Cardiac murmur, unspecified: Secondary | ICD-10-CM | POA: Diagnosis present

## 2019-02-18 DIAGNOSIS — F17200 Nicotine dependence, unspecified, uncomplicated: Secondary | ICD-10-CM

## 2019-02-18 DIAGNOSIS — Z882 Allergy status to sulfonamides status: Secondary | ICD-10-CM

## 2019-02-18 DIAGNOSIS — I6522 Occlusion and stenosis of left carotid artery: Secondary | ICD-10-CM | POA: Diagnosis not present

## 2019-02-18 DIAGNOSIS — Z888 Allergy status to other drugs, medicaments and biological substances status: Secondary | ICD-10-CM

## 2019-02-18 DIAGNOSIS — Z8673 Personal history of transient ischemic attack (TIA), and cerebral infarction without residual deficits: Secondary | ICD-10-CM

## 2019-02-18 DIAGNOSIS — E785 Hyperlipidemia, unspecified: Secondary | ICD-10-CM | POA: Diagnosis not present

## 2019-02-18 DIAGNOSIS — E669 Obesity, unspecified: Secondary | ICD-10-CM | POA: Diagnosis not present

## 2019-02-18 DIAGNOSIS — E1151 Type 2 diabetes mellitus with diabetic peripheral angiopathy without gangrene: Secondary | ICD-10-CM | POA: Diagnosis present

## 2019-02-18 DIAGNOSIS — F419 Anxiety disorder, unspecified: Secondary | ICD-10-CM | POA: Diagnosis not present

## 2019-02-18 DIAGNOSIS — Z87892 Personal history of anaphylaxis: Secondary | ICD-10-CM

## 2019-02-18 DIAGNOSIS — I214 Non-ST elevation (NSTEMI) myocardial infarction: Secondary | ICD-10-CM | POA: Diagnosis present

## 2019-02-18 DIAGNOSIS — F319 Bipolar disorder, unspecified: Secondary | ICD-10-CM | POA: Diagnosis present

## 2019-02-18 DIAGNOSIS — Z955 Presence of coronary angioplasty implant and graft: Secondary | ICD-10-CM

## 2019-02-18 DIAGNOSIS — E119 Type 2 diabetes mellitus without complications: Secondary | ICD-10-CM

## 2019-02-18 DIAGNOSIS — I2510 Atherosclerotic heart disease of native coronary artery without angina pectoris: Secondary | ICD-10-CM | POA: Diagnosis present

## 2019-02-18 DIAGNOSIS — F17210 Nicotine dependence, cigarettes, uncomplicated: Secondary | ICD-10-CM | POA: Diagnosis not present

## 2019-02-18 LAB — CBC WITH DIFFERENTIAL/PLATELET
Abs Immature Granulocytes: 0.04 10*3/uL (ref 0.00–0.07)
Basophils Absolute: 0.1 10*3/uL (ref 0.0–0.1)
Basophils Relative: 1 %
Eosinophils Absolute: 0.1 10*3/uL (ref 0.0–0.5)
Eosinophils Relative: 2 %
HCT: 44.9 % (ref 36.0–46.0)
Hemoglobin: 15.1 g/dL — ABNORMAL HIGH (ref 12.0–15.0)
Immature Granulocytes: 0 %
Lymphocytes Relative: 37 %
Lymphs Abs: 3.6 10*3/uL (ref 0.7–4.0)
MCH: 31.1 pg (ref 26.0–34.0)
MCHC: 33.6 g/dL (ref 30.0–36.0)
MCV: 92.6 fL (ref 80.0–100.0)
Monocytes Absolute: 0.6 10*3/uL (ref 0.1–1.0)
Monocytes Relative: 7 %
Neutro Abs: 5.2 10*3/uL (ref 1.7–7.7)
Neutrophils Relative %: 53 %
Platelets: 269 10*3/uL (ref 150–400)
RBC: 4.85 MIL/uL (ref 3.87–5.11)
RDW: 12.8 % (ref 11.5–15.5)
WBC: 9.6 10*3/uL (ref 4.0–10.5)
nRBC: 0 % (ref 0.0–0.2)

## 2019-02-18 LAB — COMPREHENSIVE METABOLIC PANEL
ALT: 27 U/L (ref 0–44)
AST: 31 U/L (ref 15–41)
Albumin: 3.9 g/dL (ref 3.5–5.0)
Alkaline Phosphatase: 78 U/L (ref 38–126)
Anion gap: 16 — ABNORMAL HIGH (ref 5–15)
BUN: 14 mg/dL (ref 6–20)
CO2: 28 mmol/L (ref 22–32)
Calcium: 10 mg/dL (ref 8.9–10.3)
Chloride: 92 mmol/L — ABNORMAL LOW (ref 98–111)
Creatinine, Ser: 0.89 mg/dL (ref 0.44–1.00)
GFR calc Af Amer: >60 mL/min (ref >60)
GFR calc non Af Amer: >60 mL/min (ref >60)
Glucose, Bld: 251 mg/dL — ABNORMAL HIGH (ref 70–99)
Potassium: 3.9 mmol/L (ref 3.5–5.1)
Sodium: 136 mmol/L (ref 135–145)
Total Bilirubin: 0.8 mg/dL (ref 0.3–1.2)
Total Protein: 6.6 g/dL (ref 6.5–8.1)

## 2019-02-18 LAB — TROPONIN I
Troponin I: 1.14 ng/mL (ref <0.03)
Troponin I: 0.81 ng/mL (ref <0.03)

## 2019-02-18 LAB — GLUCOSE, CAPILLARY: Glucose-Capillary: 256 mg/dL — ABNORMAL HIGH (ref 70–99)

## 2019-02-18 LAB — SARS CORONAVIRUS 2 BY RT PCR (HOSPITAL ORDER, PERFORMED IN ~~LOC~~ HOSPITAL LAB): SARS Coronavirus 2: NEGATIVE

## 2019-02-18 LAB — HCG, SERUM, QUALITATIVE: Preg, Serum: POSITIVE — AB

## 2019-02-18 MED ORDER — SODIUM CHLORIDE 0.9 % WEIGHT BASED INFUSION
3.0000 mL/kg/h | INTRAVENOUS | Status: DC
  Administered 2019-02-19: 3 mL/kg/h via INTRAVENOUS

## 2019-02-18 MED ORDER — INSULIN ASPART 100 UNIT/ML ~~LOC~~ SOLN
0.0000 [IU] | Freq: Three times a day (TID) | SUBCUTANEOUS | Status: DC
  Administered 2019-02-19: 12:00:00 5 [IU] via SUBCUTANEOUS
  Administered 2019-02-19: 3 [IU] via SUBCUTANEOUS
  Administered 2019-02-19: 5 [IU] via SUBCUTANEOUS
  Administered 2019-02-19: 07:00:00 3 [IU] via SUBCUTANEOUS
  Administered 2019-02-20: 12:00:00 5 [IU] via SUBCUTANEOUS
  Administered 2019-02-20: 3 [IU] via SUBCUTANEOUS

## 2019-02-18 MED ORDER — SODIUM CHLORIDE 0.9 % WEIGHT BASED INFUSION: 1.0000 mL/kg/h | INTRAVENOUS | Status: DC

## 2019-02-18 MED ORDER — NICOTINE 14 MG/24HR TD PT24
14.0000 mg | MEDICATED_PATCH | Freq: Every day | TRANSDERMAL | Status: DC
  Administered 2019-02-18 – 2019-02-20 (×3): 14 mg via TRANSDERMAL
  Filled 2019-02-18 (×3): qty 1

## 2019-02-18 MED ORDER — ONDANSETRON HCL 4 MG/2ML IJ SOLN: 4.0000 mg | Freq: Four times a day (QID) | INTRAMUSCULAR | Status: DC | PRN

## 2019-02-18 MED ORDER — SODIUM CHLORIDE 0.9% FLUSH: 3.0000 mL | INTRAVENOUS | Status: DC | PRN

## 2019-02-18 MED ORDER — ACETAMINOPHEN 325 MG PO TABS: 650.0000 mg | ORAL_TABLET | ORAL | Status: DC | PRN

## 2019-02-18 MED ORDER — SODIUM CHLORIDE 0.9% FLUSH
3.0000 mL | Freq: Two times a day (BID) | INTRAVENOUS | Status: DC
  Administered 2019-02-18: 22:00:00 3 mL via INTRAVENOUS

## 2019-02-18 MED ORDER — HEPARIN BOLUS VIA INFUSION
4000.0000 [IU] | Freq: Once | INTRAVENOUS | Status: AC
  Administered 2019-02-18: 4000 [IU] via INTRAVENOUS
  Filled 2019-02-18: qty 4000

## 2019-02-18 MED ORDER — SODIUM CHLORIDE 0.9 % IV SOLN: 250.0000 mL | INTRAVENOUS | Status: DC | PRN

## 2019-02-18 MED ORDER — GABAPENTIN 400 MG PO CAPS
400.0000 mg | ORAL_CAPSULE | Freq: Three times a day (TID) | ORAL | Status: DC
  Administered 2019-02-18 – 2019-02-20 (×5): 400 mg via ORAL
  Filled 2019-02-18 (×5): qty 1

## 2019-02-18 MED ORDER — ASPIRIN EC 81 MG PO TBEC
81.0000 mg | DELAYED_RELEASE_TABLET | Freq: Every day | ORAL | Status: DC
  Administered 2019-02-18 – 2019-02-19 (×2): 81 mg via ORAL
  Filled 2019-02-18 (×2): qty 1

## 2019-02-18 MED ORDER — NITROGLYCERIN 0.4 MG SL SUBL: 0.4000 mg | SUBLINGUAL_TABLET | SUBLINGUAL | Status: DC | PRN

## 2019-02-18 MED ORDER — ALPRAZOLAM 0.25 MG PO TABS
0.2500 mg | ORAL_TABLET | Freq: Three times a day (TID) | ORAL | Status: DC | PRN
  Administered 2019-02-18 – 2019-02-20 (×7): 0.25 mg via ORAL
  Filled 2019-02-18 (×7): qty 1

## 2019-02-18 MED ORDER — LISINOPRIL 40 MG PO TABS
40.0000 mg | ORAL_TABLET | Freq: Every day | ORAL | Status: DC
  Administered 2019-02-19: 40 mg via ORAL
  Filled 2019-02-18: qty 1

## 2019-02-18 MED ORDER — METOPROLOL TARTRATE 50 MG PO TABS
50.0000 mg | ORAL_TABLET | Freq: Two times a day (BID) | ORAL | Status: DC
  Administered 2019-02-18 – 2019-02-20 (×4): 50 mg via ORAL
  Filled 2019-02-18 (×4): qty 1

## 2019-02-18 MED ORDER — ATORVASTATIN CALCIUM 80 MG PO TABS
80.0000 mg | ORAL_TABLET | Freq: Every day | ORAL | Status: DC
  Administered 2019-02-18 – 2019-02-19 (×2): 80 mg via ORAL
  Filled 2019-02-18 (×2): qty 1

## 2019-02-18 MED ORDER — HEPARIN (PORCINE) 25000 UT/250ML-% IV SOLN
700.0000 [IU]/h | INTRAVENOUS | Status: DC
  Administered 2019-02-18: 850 [IU]/h via INTRAVENOUS
  Filled 2019-02-18: qty 250

## 2019-02-18 NOTE — H&P (Addendum)
Cardiology Admission History and Physical:   Patient ID: Kristen Murphy MRN: [id number]; DOB: [birth year]   Admission date: 02/18/2019  Primary Care Provider: System, Pcp Not In Primary Cardiologist: New to [facility] (Dr. Martinique) Primary Electrophysiologist:  None   Chief Complaint:  Chest Pain, Abdominal Pain, Nausea  Patient Profile:   Kristen Murphy is a 53 y.o. female with a history of stroke in 2014 and multiple TIA in 2016, left carotid artery occlusion, hypertension, hyperlipidemia, type 2 diabetes mellitus, COPD with continued tobacco use, asthma,  depression, anxiety, bipolar disorder, and prior opioid abuse, who was transferred [facility] ED for NSTEMI.  History of Present Illness:   Kristen Murphy is a 53 year old female with a history of history of stroke in 2014 and multiple TIA in 2016, left carotid artery occlusion, hypertension, hyperlipidemia, type 2 diabetes mellitus, COPD with continued tobacco use, asthma,  depression, anxiety, bipolar disorder, and prior opioid abuse. Patient does not see a Cardiologist and has never had any type of cardiac work-up. She has been told she has heart murmur before and was supposed to have an echocardiogram recently but this got canceled due to the current coronavirus epidemic.   Patient presented to the [facility] ED today for evaluation of chest pain, abdominal pain, and nausea. Patient reports intermittent chest pain, abdominal pain, nausea vomiting, and dizziness since the end of March. She states it all started with chest pain. She describes the chest pain as a pressure and reports radiation to bilateral elbows with associated shortness of breath, nausea, and diaphoresis at times. Chest pain initially only occurred with exertion but over the last week has started occurring at rest and has even woken her up from sleep. Patient also reports lower abdominal pain that she describes as a "dullness" and intermittent right  upper quadrant cramping that is not associated with meals. Patient has tried Tums, albuterol inhaler, and ibuprofen for the pain with no relief. Patient had a 2 episodes of vomiting about a week ago and another episode yesterday. Patient has significant 8-9/10 abdominal pain today as well as 5/10 chest pain so she decided to go to the ED for further evaluation. No orthopnea, PND, or edema. Patient reports a cough that is occasionally productive due to her COPD and smoking history but denies any fever, chills, body aches, or known exposure to the coronavirus.   In the ED, patient hypertensive with systolic BP as high as the 180's but vitals stable. EKG showed normal sinus rhythm with T wave inversion in anterolateral leads. Initial troponin elevated at 1.15. NT-proBNP elevated at 2,140. Chest x-ray showed no acute findings. Chest/Abdominal/Pelvic CTA showed no evidence of aortic dissection or aneurysm but left common carotid artery was noted to be narrowed and occluded just distal to the origin. WBC 11.6, Hgb 16.0, Plts 272. Na 133, K 4.3, Glucose 220, SCr 0.60. Lipase elevated at 396. AST 44, SLT 30, Alk Phos 93. Patient was given Aspirin, Zofran, and IV Morphine in the ED with almost completely resolution of pain.   Patient has a 40 year smoking history and currently smoke 1.5 pack per day. She reports rare alcohol use. She has a history of opioid abuse but states she has been clean for 2 years. She has a family history of heart with her mother having a history of CAD and stroke.  Past Medical History:  Diagnosis Date   Anemia    Anxiety    Arthritis  Asthma    Chronic kidney disease    Coronary artery disease    Depression    Diabetes mellitus without complication ([place])    Dyspnea    Headache    Heart murmur    Hypertension    Seizures ([place])    Stroke [facility])     Past Surgical History:  Procedure Laterality Date   TONSILLECTOMY       Medications Prior to  Admission: Prior to Admission medications   Medication Sig Start Date End Date Taking? Authorizing Provider  amLODipine (NORVASC) 10 MG tablet Take 1 tablet (10 mg total) by mouth daily. 02/23/17   Hildred Priest, MD  aspirin EC 81 MG EC tablet Take 1 tablet (81 mg total) by mouth daily. 02/23/17   Hildred Priest, MD  cloNIDine (CATAPRES) 0.1 MG tablet Take 1 tablet (0.1 mg total) by mouth 2 (two) times daily. 02/22/17   Hildred Priest, MD  FLUoxetine (PROZAC) 20 MG capsule Take 1 capsule (20 mg total) by mouth daily. 02/23/17   Hildred Priest, MD  gabapentin (NEURONTIN) 400 MG capsule Take 2 capsules (800 mg total) by mouth 2 (two) times daily. 02/22/17   Hildred Priest, MD  metFORMIN (GLUCOPHAGE) 500 MG tablet Take 1 tablet (500 mg total) by mouth 2 (two) times daily with a meal. 02/22/17   Hildred Priest, MD  metoprolol tartrate (LOPRESSOR) 50 MG tablet Take 1 tablet (50 mg total) by mouth 2 (two) times daily. 02/22/17   Hildred Priest, MD  traZODone (DESYREL) 150 MG tablet Take 1 tablet (150 mg total) by mouth at bedtime. 02/22/17   Hildred Priest, MD     Allergies:    Allergies  Allergen Reactions   Sulfa Antibiotics Anaphylaxis and Hives   Effexor [Venlafaxine] Hives    Social History:   Social History   Socioeconomic History   Marital status: Married    Spouse name: Not on file   Number of children: Not on file   Years of education: Not on file   Highest education level: Not on file  Occupational History   Not on file  Social Needs   Financial resource strain: Not on file   Food insecurity:    Worry: Not on file    Inability: Not on file   Transportation needs:    Medical: Not on file    Non-medical: Not on file  Tobacco Use   Smoking status: Current Every Day Smoker    Packs/day: 3.00    Years: 38.00    Pack years: 114.00   Smokeless tobacco: Never Used  Substance and  Sexual Activity   Alcohol use: Not on file   Drug use: Yes    Types: Cocaine, Benzodiazepines, Amphetamines, "Crack" cocaine, Marijuana, Opium, Methylphenidate, Heroin   Sexual activity: Never  Lifestyle   Physical activity:    Days per week: Not on file    Minutes per session: Not on file   Stress: Not on file  Relationships   Social connections:    Talks on phone: Not on file    Gets together: Not on file    Attends religious service: Not on file    Active member of club or organization: Not on file    Attends meetings of clubs or organizations: Not on file    Relationship status: Not on file   Intimate partner violence:    Fear of current or ex partner: Not on file    Emotionally abused: Not on file    Physically abused:  Not on file    Forced sexual activity: Not on file  Other Topics Concern   Not on file  Social History Narrative   Not on file    Family History:   The patient's family history is not on file.    ROS:  Please see the history of present illness.  Review of Systems  Constitutional: Positive for chills and fever.  HENT: Negative for congestion and sore throat.   Respiratory: Positive for cough, sputum production and shortness of breath.   Cardiovascular: Positive for chest pain. Negative for palpitations, orthopnea, leg swelling and PND.  Gastrointestinal: Positive for abdominal pain, nausea and vomiting. Negative for blood in stool.  Genitourinary: Negative for hematuria.  Musculoskeletal: Positive for joint pain. Negative for myalgias. Falls: due to arthritis.  Neurological: Positive for dizziness. Negative for loss of consciousness.  Endo/Heme/Allergies: Positive for environmental allergies. Does not bruise/bleed easily.  Psychiatric/Behavioral: Positive for substance abuse. The patient is nervous/anxious.        Physical Exam/Data:  There were no vitals filed for this visit. No intake or output data in the 24 hours ending 02/18/19 1711 No  flowsheet data found.   There is no height or weight on file to calculate BMI.  General:  Obese Caucasian female resting comfortably in no acute distress. HEENT: Head normocephalic and atraumatic. Sclera clear. Neck: Supple. No JVD appreciated. Vascular: Bilateral carotid bruits (left greater than right); Radial pulses 2+ and equal bilaterally. Cardiac: RRR. Distinct S1 and S2. Soft II/VI systolic murmur best heart at right upper sternal border. Lungs: No increased work of breathing. Decreased breath but clear to auscultation bilaterally. No  wheezing, rhonchi, or rales  Abd: Soft, obese, non-tender. Bowel sounds present. Ext: No lower extremity edema. Musculoskeletal:  No deformities, BUE and BLE strength normal and equal. Skin: Warm and dry. Neuro:  Alert and oriented x3. No focal deficits. Psych:  Normal affect. Responds appropriately.    EKG:  The ECG that was done  was personally reviewed and demonstrates normal sinus rhythm with T wave inversion in anterolateral leads.   Relevant CV Studies: None.  Laboratory Data:  ChemistryNo results for input(s): NA, K, CL, CO2, GLUCOSE, BUN, CREATININE, CALCIUM, GFRNONAA, GFRAA, ANIONGAP in the last 168 hours.  No results for input(s): PROT, ALBUMIN, AST, ALT, ALKPHOS, BILITOT in the last 168 hours. HematologyNo results for input(s): WBC, RBC, HGB, HCT, MCV, MCH, MCHC, RDW, PLT in the last 168 hours. Cardiac EnzymesNo results for input(s): TROPONINI in the last 168 hours. No results for input(s): TROPIPOC in the last 168 hours.  BNPNo results for input(s): BNP, PROBNP in the last 168 hours.  DDimer No results for input(s): DDIMER in the last 168 hours.  Radiology/Studies:  No results found.  Assessment and Plan:   NSTEMI - Patient transferred from [facility] ED with NSTEMI. Patient has multiple cardiovascular risk factor including hypertension, hyperlipidemia, diabetes, obesity, tobacco abuse. - EKG showed T wave inversions in  anterolateral leads. - Initial troponin elevated at 1.15. Will trend here. - Patient currently chest pain free after receiving IV Morphine at [facility]. - Will check Echo. - Will check lipid panel and hemoglobin A1c. - Patient has multiple cardiovascular risk factor including  - Will start Heparin. - Continue aspirin and beta-blocker. Will add high-intensity statin. - Will proceed with cardiac catheterization tomorrow. The patient understands that risks include but are not limited to stroke (1 in 1000), death (1 in 7), kidney failure [usually temporary] (1 in 500), bleeding (1  in 200), allergic reaction [possibly serious] (1 in 200), and agrees to proceed.   Carotid Disease - Patient reportedly previously told that she had 60% blockage in one carotid artery and 100% in another. CTA showed left common carotid artery was noted to be narrowed and occluded just distal to the origin. - Consider Caroid dopplers. May defer as outpatient. Will defer to MD.  Abdominal Pain - Patient reports intermittent abdominal pain for 2 months with severe pain today. - Lipase slightly elevated at [place]. - CTA showed no acute abdominal processes. - Will recheck CMP here.  Hypertension - BP currently well controlled. - Patient on Lisinopril 41m daily and Lopressor 573mtwice daily at home will continue here.   Hyperlipidemia - Will check lipid panel. - Currently on Lipitor 109maily at home. Will increase to 9m47mily.  Type 2 Diabetes Mellitus - Will check hemoglobin A1c. - Currently on Metformin 500mg51mce daily at home. Will hold prior to cardiac catheterization. Can restart 48 hours post cath. - Will start SSI here.  Anxiety, Depression, Bipolar Disorder - Patient has a history of anxiety, depression, and bipolar disorder but is not currently on any medication. - Patient requested something for anxiety. Will start Xanax 0.25mg 26me times daily per Dr. JordanMartiniqueacco Use - Patient has a  40 year smoking history and currently smokes 1.5 packs per day. - Patient requested Nicotine patch.  History of Opioid Abuse - Patient has a history of opioid abuse but reportedly has been clean for 2 years.  History of Stroke - Patient has a history of stroke in 2014 and reportedly multiple TIAs in 2016.    Severity of Illness: The appropriate patient status for this patient is OBSERVATION. Observation status is judged to be reasonable and necessary in order to provide the required intensity of service to ensure the patient's safety. The patient's presenting symptoms, physical exam findings, and initial radiographic and laboratory data in the context of their medical condition is felt to place them at decreased risk for further clinical deterioration. Furthermore, it is anticipated that the patient will be medically stable for discharge from the hospital within 2 midnights of admission. The following factors support the patient status of observation.   " The patient's presenting symptoms include chest pain, abdominal pain, nausea. " The physical exam findings as above. " The initial radiographic and laboratory data are elevated troponin.     For questions or updates, please contact [facility] consult [email or web address] for contact info under        Signed, CallieDarreld Mclean  02/18/2019 5:11 PM

## 2019-02-18 NOTE — Progress Notes (Signed)
CRITICAL VALUE ALERT  Critical Value:  Troponin 1.14  Date & Time Notied:  5/27 1950  Provider Notified: Aundra Dubin  Orders Received/Actions taken: none

## 2019-02-18 NOTE — Progress Notes (Signed)
ANTICOAGULATION CONSULT NOTE - Initial Consult  Pharmacy Consult for heparin Indication: ACS  Allergies  Allergen Reactions  . Sulfa Antibiotics Anaphylaxis and Hives  . Effexor [Venlafaxine] Hives    Patient Measurements: Height: 5\' 2"  (157.5 cm) Weight: 214 lb 11.7 oz (97.4 kg) IBW/kg (Calculated) : 50.1 Heparin Dosing Weight: 73kg  Vital Signs: Temp: 98.1 F (36.7 C) (05/27 1713) Temp Source: Oral (05/27 1713) BP: 134/67 (05/27 1713) Pulse Rate: 79 (05/27 1713)  Labs: No results for input(s): HGB, HCT, PLT, APTT, LABPROT, INR, HEPARINUNFRC, HEPRLOWMOCWT, CREATININE, CKTOTAL, CKMB, TROPONINI in the last 72 hours.  CrCl cannot be calculated (Patient's most recent lab result is older than the maximum 21 days allowed.).   Medical History: Past Medical History:  Diagnosis Date  . Anemia   . Anxiety   . Arthritis   . Asthma   . Chronic kidney disease   . Coronary artery disease   . Depression   . Diabetes mellitus without complication ([place])   . Dyspnea   . Headache   . Heart murmur   . Hypertension   . Seizures ([place])   . Stroke [facility])     Assessment: 53 yo female with NSTEMI/ACS to start heparin drip. No PTA anticoagulation noted.   Goal of Therapy:  Heparin level 0.3-0.7 units/ml Monitor platelets by anticoagulation protocol: Yes   Plan:  Heparin 4000 units bolus Heparin drip at 850 units/hr Heparin level in 8 hours Daily Heparin level, CBC Monitor for bleeding  Domingue Coltrain A Kayler Rise 02/18/2019,7:36 PM

## 2019-02-19 ENCOUNTER — Encounter (HOSPITAL_COMMUNITY)
Admission: AD | Disposition: A | Discharge status: Home / Self Care | Payer: Self-pay | Source: Other Acute Inpatient Hospital | Attending: Cardiology

## 2019-02-19 ENCOUNTER — Observation Stay (HOSPITAL_BASED_OUTPATIENT_CLINIC_OR_DEPARTMENT_OTHER): Payer: Medicaid Other

## 2019-02-19 ENCOUNTER — Encounter (HOSPITAL_COMMUNITY): Payer: Self-pay | Admitting: Cardiovascular Disease

## 2019-02-19 DIAGNOSIS — R011 Cardiac murmur, unspecified: Secondary | ICD-10-CM | POA: Diagnosis present

## 2019-02-19 DIAGNOSIS — I2510 Atherosclerotic heart disease of native coronary artery without angina pectoris: Secondary | ICD-10-CM | POA: Diagnosis present

## 2019-02-19 DIAGNOSIS — J449 Chronic obstructive pulmonary disease, unspecified: Secondary | ICD-10-CM | POA: Diagnosis present

## 2019-02-19 DIAGNOSIS — F17200 Nicotine dependence, unspecified, uncomplicated: Secondary | ICD-10-CM | POA: Diagnosis not present

## 2019-02-19 DIAGNOSIS — I10 Essential (primary) hypertension: Secondary | ICD-10-CM | POA: Diagnosis present

## 2019-02-19 DIAGNOSIS — E669 Obesity, unspecified: Secondary | ICD-10-CM | POA: Diagnosis present

## 2019-02-19 DIAGNOSIS — I214 Non-ST elevation (NSTEMI) myocardial infarction: Secondary | ICD-10-CM | POA: Diagnosis present

## 2019-02-19 DIAGNOSIS — Z882 Allergy status to sulfonamides status: Secondary | ICD-10-CM | POA: Diagnosis not present

## 2019-02-19 DIAGNOSIS — Z8673 Personal history of transient ischemic attack (TIA), and cerebral infarction without residual deficits: Secondary | ICD-10-CM | POA: Diagnosis not present

## 2019-02-19 DIAGNOSIS — E1151 Type 2 diabetes mellitus with diabetic peripheral angiopathy without gangrene: Secondary | ICD-10-CM | POA: Diagnosis present

## 2019-02-19 DIAGNOSIS — F319 Bipolar disorder, unspecified: Secondary | ICD-10-CM | POA: Diagnosis present

## 2019-02-19 DIAGNOSIS — Z1159 Encounter for screening for other viral diseases: Secondary | ICD-10-CM | POA: Diagnosis not present

## 2019-02-19 DIAGNOSIS — F419 Anxiety disorder, unspecified: Secondary | ICD-10-CM | POA: Diagnosis present

## 2019-02-19 DIAGNOSIS — E785 Hyperlipidemia, unspecified: Secondary | ICD-10-CM | POA: Diagnosis present

## 2019-02-19 DIAGNOSIS — F17210 Nicotine dependence, cigarettes, uncomplicated: Secondary | ICD-10-CM | POA: Diagnosis present

## 2019-02-19 DIAGNOSIS — Z888 Allergy status to other drugs, medicaments and biological substances status: Secondary | ICD-10-CM | POA: Diagnosis not present

## 2019-02-19 DIAGNOSIS — I679 Cerebrovascular disease, unspecified: Secondary | ICD-10-CM | POA: Diagnosis present

## 2019-02-19 DIAGNOSIS — I6522 Occlusion and stenosis of left carotid artery: Secondary | ICD-10-CM | POA: Diagnosis present

## 2019-02-19 DIAGNOSIS — Z87892 Personal history of anaphylaxis: Secondary | ICD-10-CM | POA: Diagnosis not present

## 2019-02-19 DIAGNOSIS — Z8249 Family history of ischemic heart disease and other diseases of the circulatory system: Secondary | ICD-10-CM | POA: Diagnosis not present

## 2019-02-19 HISTORY — PX: CORONARY STENT INTERVENTION: CATH118234

## 2019-02-19 HISTORY — PX: LEFT HEART CATH AND CORONARY ANGIOGRAPHY: CATH118249

## 2019-02-19 LAB — BASIC METABOLIC PANEL
Anion gap: 14 (ref 5–15)
BUN: 15 mg/dL (ref 6–20)
CO2: 25 mmol/L (ref 22–32)
Calcium: 9.3 mg/dL (ref 8.9–10.3)
Chloride: 96 mmol/L — ABNORMAL LOW (ref 98–111)
Creatinine, Ser: 0.81 mg/dL (ref 0.44–1.00)
GFR calc Af Amer: >60 mL/min (ref >60)
GFR calc non Af Amer: >60 mL/min (ref >60)
Glucose, Bld: 182 mg/dL — ABNORMAL HIGH (ref 70–99)
Potassium: 3.8 mmol/L (ref 3.5–5.1)
Sodium: 135 mmol/L (ref 135–145)

## 2019-02-19 LAB — GLUCOSE, CAPILLARY
Glucose-Capillary: 200 mg/dL — ABNORMAL HIGH (ref 70–99)
Glucose-Capillary: 191 mg/dL — ABNORMAL HIGH (ref 70–99)
Glucose-Capillary: 209 mg/dL — ABNORMAL HIGH (ref 70–99)
Glucose-Capillary: 201 mg/dL — ABNORMAL HIGH (ref 70–99)
Glucose-Capillary: 185 mg/dL — ABNORMAL HIGH (ref 70–99)

## 2019-02-19 LAB — ECHOCARDIOGRAM COMPLETE
Height: 62 in
Weight: 3283.97 oz

## 2019-02-19 LAB — LIPID PANEL
Cholesterol: 269 mg/dL — ABNORMAL HIGH (ref 0–200)
HDL: 32 mg/dL — ABNORMAL LOW (ref >40)
LDL Cholesterol: UNDETERMINED mg/dL (ref 0–99)
Total CHOL/HDL Ratio: 8.4 RATIO
Triglycerides: 670 mg/dL — ABNORMAL HIGH (ref <150)
VLDL: UNDETERMINED mg/dL (ref 0–40)

## 2019-02-19 LAB — HEMOGLOBIN A1C
Hgb A1c MFr Bld: 9.2 % — ABNORMAL HIGH (ref 4.8–5.6)
Mean Plasma Glucose: 217.34 mg/dL

## 2019-02-19 LAB — POCT ACTIVATED CLOTTING TIME: Activated Clotting Time: 290 seconds

## 2019-02-19 LAB — HIV ANTIBODY (ROUTINE TESTING W REFLEX): HIV Screen 4th Generation wRfx: NONREACTIVE

## 2019-02-19 LAB — LDL CHOLESTEROL, DIRECT: Direct LDL: 158.3 mg/dL — ABNORMAL HIGH (ref 0–99)

## 2019-02-19 LAB — TROPONIN I: Troponin I: 0.7 ng/mL (ref <0.03)

## 2019-02-19 LAB — HEPARIN LEVEL (UNFRACTIONATED): Heparin Unfractionated: 1 IU/mL — ABNORMAL HIGH (ref 0.30–0.70)

## 2019-02-19 SURGERY — LEFT HEART CATH AND CORONARY ANGIOGRAPHY
Anesthesia: LOCAL

## 2019-02-19 MED ORDER — SODIUM CHLORIDE 0.9 % IV SOLN
INTRAVENOUS | Status: AC
  Administered 2019-02-19: 10:00:00 via INTRAVENOUS

## 2019-02-19 MED ORDER — SODIUM CHLORIDE 0.9 % IV SOLN: 250.0000 mL | INTRAVENOUS | Status: DC | PRN

## 2019-02-19 MED ORDER — MORPHINE SULFATE (PF) 2 MG/ML IV SOLN: 2.0000 mg | INTRAVENOUS | Status: DC | PRN

## 2019-02-19 MED ORDER — TICAGRELOR 90 MG PO TABS
ORAL_TABLET | ORAL | Status: DC | PRN
  Administered 2019-02-19: 180 mg via ORAL

## 2019-02-19 MED ORDER — ATORVASTATIN CALCIUM 10 MG PO TABS: 10.0000 mg | ORAL_TABLET | Freq: Every day | ORAL | Status: DC

## 2019-02-19 MED ORDER — FENTANYL CITRATE (PF) 100 MCG/2ML IJ SOLN
INTRAMUSCULAR | Status: AC
  Filled 2019-02-19: qty 2

## 2019-02-19 MED ORDER — HEPARIN SODIUM (PORCINE) 1000 UNIT/ML IJ SOLN
INTRAMUSCULAR | Status: AC
  Filled 2019-02-19: qty 1

## 2019-02-19 MED ORDER — VERAPAMIL HCL 2.5 MG/ML IV SOLN
INTRAVENOUS | Status: DC | PRN
  Administered 2019-02-19: 10 mL via INTRA_ARTERIAL

## 2019-02-19 MED ORDER — HEPARIN (PORCINE) IN NACL 1000-0.9 UT/500ML-% IV SOLN
INTRAVENOUS | Status: AC
  Filled 2019-02-19: qty 1000

## 2019-02-19 MED ORDER — LIDOCAINE HCL (PF) 1 % IJ SOLN
INTRAMUSCULAR | Status: AC
  Filled 2019-02-19: qty 30

## 2019-02-19 MED ORDER — HYDRALAZINE HCL 20 MG/ML IJ SOLN: 10.0000 mg | INTRAMUSCULAR | Status: AC | PRN

## 2019-02-19 MED ORDER — SODIUM CHLORIDE 0.9% FLUSH
3.0000 mL | Freq: Two times a day (BID) | INTRAVENOUS | Status: DC
  Administered 2019-02-19 (×2): 3 mL via INTRAVENOUS

## 2019-02-19 MED ORDER — ACETAMINOPHEN 325 MG PO TABS
650.0000 mg | ORAL_TABLET | ORAL | Status: DC | PRN
  Administered 2019-02-19: 20:00:00 650 mg via ORAL
  Filled 2019-02-19: qty 2

## 2019-02-19 MED ORDER — IOHEXOL 350 MG/ML SOLN
INTRAVENOUS | Status: DC | PRN
  Administered 2019-02-19: 115 mL via INTRACARDIAC

## 2019-02-19 MED ORDER — ASPIRIN 81 MG PO CHEW
81.0000 mg | CHEWABLE_TABLET | Freq: Every day | ORAL | Status: DC
  Administered 2019-02-20: 81 mg via ORAL
  Filled 2019-02-19: qty 1

## 2019-02-19 MED ORDER — HEPARIN (PORCINE) IN NACL 1000-0.9 UT/500ML-% IV SOLN
INTRAVENOUS | Status: DC | PRN
  Administered 2019-02-19 (×2): 500 mL

## 2019-02-19 MED ORDER — MIDAZOLAM HCL 2 MG/2ML IJ SOLN
INTRAMUSCULAR | Status: DC | PRN
  Administered 2019-02-19 (×2): 1 mg via INTRAVENOUS

## 2019-02-19 MED ORDER — LIDOCAINE HCL (PF) 1 % IJ SOLN
INTRAMUSCULAR | Status: DC | PRN
  Administered 2019-02-19: 2 mL via INTRADERMAL

## 2019-02-19 MED ORDER — SODIUM CHLORIDE 0.9% FLUSH: 3.0000 mL | INTRAVENOUS | Status: DC | PRN

## 2019-02-19 MED ORDER — NITROGLYCERIN 1 MG/10 ML FOR IR/CATH LAB
INTRA_ARTERIAL | Status: AC
  Filled 2019-02-19: qty 10

## 2019-02-19 MED ORDER — MIDAZOLAM HCL 2 MG/2ML IJ SOLN
INTRAMUSCULAR | Status: AC
  Filled 2019-02-19: qty 2

## 2019-02-19 MED ORDER — LISINOPRIL 40 MG PO TABS
40.0000 mg | ORAL_TABLET | Freq: Every day | ORAL | Status: DC
  Administered 2019-02-19 – 2019-02-20 (×2): 40 mg via ORAL
  Filled 2019-02-19: qty 1

## 2019-02-19 MED ORDER — TICAGRELOR 90 MG PO TABS
90.0000 mg | ORAL_TABLET | Freq: Two times a day (BID) | ORAL | Status: DC
  Administered 2019-02-19 – 2019-02-20 (×2): 90 mg via ORAL
  Filled 2019-02-19 (×2): qty 1

## 2019-02-19 MED ORDER — TICAGRELOR 90 MG PO TABS
ORAL_TABLET | ORAL | Status: AC
  Filled 2019-02-19: qty 1

## 2019-02-19 MED ORDER — LABETALOL HCL 5 MG/ML IV SOLN: 10.0000 mg | INTRAVENOUS | Status: AC | PRN

## 2019-02-19 MED ORDER — HEPARIN SODIUM (PORCINE) 1000 UNIT/ML IJ SOLN
INTRAMUSCULAR | Status: DC | PRN
  Administered 2019-02-19: 6500 [IU] via INTRAVENOUS
  Administered 2019-02-19: 4500 [IU] via INTRAVENOUS

## 2019-02-19 MED ORDER — ONDANSETRON HCL 4 MG/2ML IJ SOLN: 4.0000 mg | Freq: Four times a day (QID) | INTRAMUSCULAR | Status: DC | PRN

## 2019-02-19 MED ORDER — FENTANYL CITRATE (PF) 100 MCG/2ML IJ SOLN
INTRAMUSCULAR | Status: DC | PRN
  Administered 2019-02-19 (×2): 25 ug via INTRAVENOUS

## 2019-02-19 SURGICAL SUPPLY — 20 items
BALLN SAPPHIRE 2.0X12 (BALLOONS) ×2
BALLN SAPPHIRE ~~LOC~~ 2.75X12 (BALLOONS) ×1 IMPLANT
BALLOON SAPPHIRE 2.0X12 (BALLOONS) IMPLANT
CATH INFINITI 5FR ANG PIGTAIL (CATHETERS) ×1 IMPLANT
CATH OPTITORQUE TIG 4.0 5F (CATHETERS) ×1 IMPLANT
CATH VISTA GUIDE 6FR XBLAD3.5 (CATHETERS) ×1 IMPLANT
COVER DOME SNAP 22 D (MISCELLANEOUS) ×1 IMPLANT
DEVICE RAD COMP TR BAND LRG (VASCULAR PRODUCTS) ×1 IMPLANT
GLIDESHEATH SLEND A-KIT 6F 22G (SHEATH) ×1 IMPLANT
GUIDEWIRE INQWIRE 1.5J.035X260 (WIRE) IMPLANT
INQWIRE 1.5J .035X260CM (WIRE) ×2
KIT ENCORE 26 ADVANTAGE (KITS) ×1 IMPLANT
KIT HEART LEFT (KITS) ×2 IMPLANT
PACK CARDIAC CATHETERIZATION (CUSTOM PROCEDURE TRAY) ×2 IMPLANT
STENT SYNERGY DES 2.5X20 (Permanent Stent) ×1 IMPLANT
SYR MEDRAD MARK 7 150ML (SYRINGE) ×2 IMPLANT
TRANSDUCER W/STOPCOCK (MISCELLANEOUS) ×2 IMPLANT
TUBING CIL FLEX 10 FLL-RA (TUBING) ×2 IMPLANT
WIRE ASAHI PROWATER 180CM (WIRE) ×1 IMPLANT
WIRE HI TORQ VERSACORE-J 145CM (WIRE) ×1 IMPLANT

## 2019-02-19 NOTE — Interval H&P Note (Signed)
Cath Lab Visit (complete for each Cath Lab visit)  Clinical Evaluation Leading to the Procedure:   ACS: Yes.    Non-ACS:    Anginal Classification: CCS III  Anti-ischemic medical therapy: No Therapy  Non-Invasive Test Results: No non-invasive testing performed  Prior CABG: No previous CABG      History and Physical Interval Note:  02/19/2019 7:35 AM  Kristen Murphy  has presented today for surgery, with the diagnosis of chest pain.  The various methods of treatment have been discussed with the patient and family. After consideration of risks, benefits and other options for treatment, the patient has consented to  Procedure(s): LEFT HEART CATH AND CORONARY ANGIOGRAPHY (N/A) as a surgical intervention.  The patient's history has been reviewed, patient examined, no change in status, stable for surgery.  I have reviewed the patient's chart and labs.  Questions were answered to the patient's satisfaction.     Quay Burow

## 2019-02-19 NOTE — Plan of Care (Signed)
  Problem: Education: Goal: Knowledge of General Education information will improve Description Including pain rating scale, medication(s)/side effects and non-pharmacologic comfort measures Outcome: Progressing   Problem: Clinical Measurements: Goal: Will remain free from infection Outcome: Progressing Goal: Respiratory complications will improve Outcome: Progressing   Problem: Health Behavior/Discharge Planning: Goal: Ability to manage health-related needs will improve Outcome: Not Progressing   Problem: Clinical Measurements: Goal: Cardiovascular complication will be avoided Outcome: Not Progressing

## 2019-02-19 NOTE — Progress Notes (Signed)
  Echocardiogram 2D Echocardiogram has been performed.  Kristen Murphy 02/19/2019, 1:25 PM

## 2019-02-19 NOTE — Progress Notes (Signed)
ANTICOAGULATION CONSULT NOTE - Follow Up Consult  Pharmacy Consult for heparin Indication: NSTEMI  Labs: Recent Labs    02/18/19 1839 02/18/19 2119 02/18/19 2123 02/19/19 0332 02/19/19 0333  HGB  --   --  15.1*  --   --   HCT  --   --  44.9  --   --   PLT  --   --  269  --   --   HEPARINUNFRC  --   --   --  1.00*  --   CREATININE  --   --  0.89  --  0.81  TROPONINI 1.14* 0.81*  --   --  0.70*    Assessment: 53yo female supratherapeutic on heparin with initial dosing for NSTEMI; no gtt issues or signs of bleeding per RN.  Goal of Therapy:  Heparin level 0.3-0.7 units/ml   Plan:  Will decrease heparin gtt by 2 units/kg/hr to 700 units/hr and check level in 6 hours.    Wynona Neat, PharmD, BCPS  02/19/2019,5:56 AM

## 2019-02-20 DIAGNOSIS — E1151 Type 2 diabetes mellitus with diabetic peripheral angiopathy without gangrene: Secondary | ICD-10-CM

## 2019-02-20 LAB — CBC
HCT: 41 % (ref 36.0–46.0)
Hemoglobin: 13.4 g/dL (ref 12.0–15.0)
MCH: 31.1 pg (ref 26.0–34.0)
MCHC: 32.7 g/dL (ref 30.0–36.0)
MCV: 95.1 fL (ref 80.0–100.0)
Platelets: 213 10*3/uL (ref 150–400)
RBC: 4.31 MIL/uL (ref 3.87–5.11)
RDW: 12.8 % (ref 11.5–15.5)
WBC: 7.5 10*3/uL (ref 4.0–10.5)
nRBC: 0 % (ref 0.0–0.2)

## 2019-02-20 LAB — BASIC METABOLIC PANEL
Anion gap: 8 (ref 5–15)
BUN: 16 mg/dL (ref 6–20)
CO2: 27 mmol/L (ref 22–32)
Calcium: 8.8 mg/dL — ABNORMAL LOW (ref 8.9–10.3)
Chloride: 104 mmol/L (ref 98–111)
Creatinine, Ser: 0.82 mg/dL (ref 0.44–1.00)
GFR calc Af Amer: >60 mL/min (ref >60)
GFR calc non Af Amer: >60 mL/min (ref >60)
Glucose, Bld: 198 mg/dL — ABNORMAL HIGH (ref 70–99)
Potassium: 4.4 mmol/L (ref 3.5–5.1)
Sodium: 139 mmol/L (ref 135–145)

## 2019-02-20 LAB — GLUCOSE, CAPILLARY
Glucose-Capillary: 186 mg/dL — ABNORMAL HIGH (ref 70–99)
Glucose-Capillary: 212 mg/dL — ABNORMAL HIGH (ref 70–99)

## 2019-02-20 MED ORDER — TICAGRELOR 90 MG PO TABS: 90.0000 mg | ORAL_TABLET | Freq: Two times a day (BID) | ORAL | 2 refills | Status: DC

## 2019-02-20 MED ORDER — NITROGLYCERIN 0.4 MG SL SUBL: 0.4000 mg | SUBLINGUAL_TABLET | SUBLINGUAL | 2 refills | Status: DC | PRN

## 2019-02-20 MED ORDER — ATORVASTATIN CALCIUM 80 MG PO TABS: 80.0000 mg | ORAL_TABLET | Freq: Every day | ORAL | 2 refills | Status: DC

## 2019-02-20 MED ORDER — METOPROLOL TARTRATE 50 MG PO TABS: 50.0000 mg | ORAL_TABLET | Freq: Two times a day (BID) | ORAL | 6 refills | Status: DC

## 2019-02-20 MED ORDER — METFORMIN HCL 1000 MG PO TABS: 1000.0000 mg | ORAL_TABLET | Freq: Two times a day (BID) | ORAL | 2 refills | Status: DC

## 2019-02-20 MED ORDER — ALPRAZOLAM 0.25 MG PO TABS: 0.2500 mg | ORAL_TABLET | Freq: Two times a day (BID) | ORAL | 0 refills | Status: DC | PRN

## 2019-02-20 MED ORDER — NICOTINE 14 MG/24HR TD PT24: 14.0000 mg | MEDICATED_PATCH | Freq: Every day | TRANSDERMAL | 0 refills | Status: DC

## 2019-02-20 MED ORDER — NICOTINE 21 MG/24HR TD PT24: 21.0000 mg | MEDICATED_PATCH | Freq: Every day | TRANSDERMAL | 0 refills | Status: DC

## 2019-02-20 MED ORDER — ASPIRIN 81 MG PO TBEC: 81.0000 mg | DELAYED_RELEASE_TABLET | Freq: Every day | ORAL | 6 refills | Status: DC

## 2019-02-20 MED FILL — METOPROLOL TARTRATE 50 MG T: 50 | 30 days supply | Qty: 60 | Fill #0

## 2019-02-20 MED FILL — ASPIRIN LOW DOSE 81 MG TBEC: 81 | 30 days supply | Qty: 30 | Fill #0

## 2019-02-20 MED FILL — NICOTINE 21 MG/24HR PATCH: 21 | 28 days supply | Qty: 28 | Fill #0

## 2019-02-20 MED FILL — BRILINTA 90 MG TABLET: 90 | 30 days supply | Qty: 60 | Fill #0

## 2019-02-20 MED FILL — metFORMIN HCL 1000 MG TABS: 1000 | 30 days supply | Qty: 60 | Fill #0

## 2019-02-20 MED FILL — ATORVASTATIN CALCIUM 80 MG: 80 | 30 days supply | Qty: 30 | Fill #0

## 2019-02-20 MED FILL — NITROGLYCERIN 0.4 MG TAB SL: 0.4 | 7 days supply | Qty: 25 | Fill #0

## 2019-02-20 MED FILL — Nitroglycerin IV Soln 100 MCG/ML in D5W: INTRA_ARTERIAL | Qty: 10 | Status: AC

## 2019-02-20 NOTE — Progress Notes (Signed)
CARDIAC REHAB PHASE I   PRE:  Rate/Rhythm: 70 SR    BP: sitting 185/85    SaO2:   MODE:  Ambulation: 430 ft   POST:  Rate/Rhythm: 90 SR    BP: sitting 188/81     SaO2:   Tolerated well, feeling good. Very receptive to education, wants change. Discussed MI, stent, Brilinta, smoking cessation, diet, exercise at home, NTG, and CRPII. Will refer to [place]. She is planning to quit smoking but nervous, in depth conversation of benefits.  [place], ACSM 02/20/2019 10:26 AM

## 2019-02-20 NOTE — Plan of Care (Signed)
  Problem: Education: Goal: Knowledge of General Education information will improve Description Including pain rating scale, medication(s)/side effects and non-pharmacologic comfort measures Outcome: Completed/Met   Problem: Health Behavior/Discharge Planning: Goal: Ability to manage health-related needs will improve Outcome: Completed/Met   Problem: Clinical Measurements: Goal: Ability to maintain clinical measurements within normal limits will improve Outcome: Completed/Met Goal: Will remain free from infection Outcome: Completed/Met Goal: Diagnostic test results will improve Outcome: Completed/Met Goal: Respiratory complications will improve Outcome: Completed/Met Goal: Cardiovascular complication will be avoided Outcome: Completed/Met   Problem: Coping: Goal: Level of anxiety will decrease Outcome: Completed/Met   Problem: Elimination: Goal: Will not experience complications related to bowel motility Outcome: Completed/Met Goal: Will not experience complications related to urinary retention Outcome: Completed/Met   Problem: Pain Managment: Goal: General experience of comfort will improve Outcome: Completed/Met   Problem: Safety: Goal: Ability to remain free from injury will improve Outcome: Completed/Met   Problem: Skin Integrity: Goal: Risk for impaired skin integrity will decrease Outcome: Completed/Met

## 2019-02-20 NOTE — Plan of Care (Signed)
  Problem: Coping: Goal: Level of anxiety will decrease Outcome: Progressing Note:  PRN xanax effective.   Problem: Pain Managment: Goal: General experience of comfort will improve Outcome: Progressing Note:  Tylenol effective.

## 2019-02-20 NOTE — Progress Notes (Addendum)
Inpatient Diabetes Program Recommendations  AACE/ADA: New Consensus Statement on Inpatient Glycemic Control (2015)  Target Ranges:  Prepandial:   less than 140 mg/dL      Peak postprandial:   less than 180 mg/dL (1-2 hours)      Critically ill patients:  140 - 180 mg/dL   Lab Results  Component Value Date   GLUCAP 212 (H) 02/20/2019   HGBA1C 9.2 (H) 02/19/2019   Met with patient today prior to her discharge home. DM2 taking 557m BID of Metformin. MD has increased this dose to 10041mBID now at discharge.  Spoke with patient about current A1c of 9.2% (average blood glucose of 21772ml). Explained what an A1c is and what it measures. Reminded patient that goal A1c is 7% or less per ADA standards to prevent both acute and long-term complications. Last documented Hgb A1c was in 01/2017 was 6.8%.   Explained to patient the extreme importance of good glucose control at home. She does not have a meter therefore has not checked it at home. [facility] would [facility] one and use it. Encouraged patient to check CBGs at least bid at home (fasting and another check within the day) and to record all CBGs in a logbook for PCP to review. She states she does have a PCP and I explained the importance of keeping track of her CBG values to share with her MD at f/u appointments. She stated she is going to make dietary changes also and is motivated now to lower her Hgb A1c.   -- Will follow during hospitalization.--  SusJonna Clark, MSN Diabetes Coordinator Inpatient Glycemic Control Team Team Pager: [phone])

## 2019-02-20 NOTE — Discharge Summary (Addendum)
Discharge Summary    Patient ID: Kristen Murphy,  MRN: [id number], DOB/AGE: 1966-03-04 53 y.o.  Admit date: 02/18/2019 Discharge date: 02/20/2019  Primary Care Provider: System, Pcp Not In Primary Cardiologist: Requests Doctor Phillips-Dr. Bettina Gavia   Discharge Diagnoses    Principal Problem:   Non-ST elevation (NSTEMI) myocardial infarction [facility]) Active Problems:   HTN (hypertension)   DM (diabetes mellitus) ([place])   Cerebrovascular disease   Tobacco use disorder   NSTEMI (non-ST elevated myocardial infarction) (HCC)   Allergies Allergies  Allergen Reactions   Sulfa Antibiotics Anaphylaxis and Hives   Effexor [Venlafaxine] Hives    Diagnostic Studies/Procedures    Cath: 02/19/19   Prox LAD to Mid LAD lesion is 95% stenosed.  Mid Cx to Dist Cx lesion is 60% stenosed.  Prox RCA to Mid RCA lesion is 90% stenosed.  A drug-eluting stent was successfully placed using a STENT SYNERGY DES 2.5X20.  Post intervention, there is a 0% residual stenosis.  The left ventricular systolic function is normal.  LV end diastolic pressure is normal.  The left ventricular ejection fraction is 55-65% by visual estimate.  IMPRESSION: Successful mid LAD PCI and drug-eluting stenting in the setting of non-STEMI with anterolateral T wave inversion.  Her EF is normal.  She does have moderate disease in her left PDA and diffusely diseased small nondominant RCA.  She will need DAPT with aspirin and Brilinta for 12 months uninterrupted, high-dose statin therapy, beta blockade and cardiac risk factor modification.  Quay Burow. MD, [facility]  Diagnostic  Dominance: Left    Intervention      TTE: 02/19/19  IMPRESSIONS    1. The left ventricle has hyperdynamic systolic function, with an ejection fraction of >65%. The cavity size was normal. Left ventricular diastolic Doppler parameters are consistent with impaired relaxation. No evidence of left ventricular regional wall  motion  abnormalities.  2. The right ventricle has normal systolic function. The cavity was normal. There is no increase in right ventricular wall thickness.  3. The aortic root is normal in size and structure. _____________   History of Present Illness     Kristen Murphy is a 53 year old female with a history of history of stroke in 2014 and multiple TIA in 2016, left carotid artery occlusion, hypertension, hyperlipidemia, type 2 diabetes mellitus, COPD with continued tobacco use, asthma,  depression, anxiety, bipolar disorder, and prior opioid abuse. Patient has not seen a Cardiologist and had never had any type of cardiac work-up. She had been told she has heart murmur before and was supposed to have an echocardiogram recently but this was canceled due to the current coronavirus epidemic.   Patient presented to the [facility] ED 02/18/19 for evaluation of chest pain, abdominal pain, and nausea. Patient reported intermittent chest pain, abdominal pain, nausea vomiting, and dizziness since the end of March. She stated it all started with chest pain. She described the chest pain as a pressure and reported radiation to bilateral elbows with associated shortness of breath, nausea, and diaphoresis at times. Chest pain initially only occurred with exertion but over the last week had started occurring at rest and had even woken her up from sleep. Patient also reported lower abdominal pain that she described as a "dullness" and intermittent right upper quadrant cramping that was not associated with meals. Patient had tried Tums, albuterol inhaler, and ibuprofen for the pain with no relief. Patient had a 2 episodes of vomiting about a week prior. Patient had significant  8-9/10 abdominal pain as well as 5/10 chest pain so she decided to go to the ED for further evaluation. No orthopnea, PND, or edema. Patient reported a cough that was occasionally productive due to her COPD and smoking history but denies any fever,  chills, body aches, or known exposure to the coronavirus.   In the ED, patient was hypertensive with systolic BP as high as the 180's but vitals stable. EKG showed normal sinus rhythm with T wave inversion in anterolateral leads. Initial troponin elevated at 1.15. NT-proBNP elevated at 2,140. Chest x-ray showed no acute findings. Chest/Abdominal/Pelvic CTA showed no evidence of aortic dissection or aneurysm but left common carotid artery was noted to be narrowed and occluded just distal to the origin. WBC 11.6, Hgb 16.0, Plts 272. Na 133, K 4.3, Glucose 220, SCr 0.60. Lipase elevated at 396. AST 44, SLT 30, Alk Phos 93. Patient was given Aspirin, Zofran, and IV Morphine in the ED with almost completely resolution of pain.   Patient has a 40 year smoking history and currently smoke 1.5 pack per day. She reported rare alcohol use. She had a history of opioid abuse but stated she has been clean for 2 years. She has a family history of heart with her mother having a history of CAD and stroke.  She was admitted and planned for cardiac cath.   Hospital Course     Her troponin peaked at 1.14. Underwent cardiac cath noted above with successful PCI/DESx1 to the mLAD. Also noted to have 90% in the pRCA but a small non-dominate vessel. She was placed on DAPT with ASA/Brilinta for at least one year. LDL was unable to be calculated, Trig 670, cholesterol 269. Placed on high dose statin. Also tolerated the continuation of BB and ACEi. Hgb A1c 9.2 with plans to increase her metformin to 1085m BID at the time of discharge. Discussed the need for tobacco cessation, and she plans to use nicotine patches that the time of discharge. Echo showed EF of 65% with no rWMA. She was seen by cardiac rehab and ambulated without complications. Seen by Dr. JMartiniqueat discharge. She requested xanax to help with anxiety and her attempts to stop smoking until she was able to establish with PCP. Review of hx showed she has struggled with  benzo, and opioids in the past. Discussed with patient that she would need to establish with a PCP for this medication moving forward and obtain Rx from them. She voiced understanding. Consider adding Jardiance at out patient follow up if cost is not an issue.    General: Well developed, well nourished, female appearing in no acute distress. Head: Normocephalic, atraumatic.  Neck: Supple without bruits, JVD. Lungs:  Resp regular and unlabored, CTA. Heart: RRR, S1, S2, no S3, S4, or murmur; no rub. Abdomen: Soft, non-tender, non-distended with normoactive bowel sounds. No hepatomegaly. No rebound/guarding. No obvious abdominal masses. Extremities: No clubbing, cyanosis, edema. Distal pedal pulses are 2+ bilaterally. Right radial cath site stable without bruising or hematoma Neuro: Alert and oriented X 3. Moves all extremities spontaneously. Psych: Normal affect.  Shakita SEMILEY DIGIACOMOwas seen by Dr. JMartiniqueand determined stable for discharge home. Follow up in the office has been arranged. Medications are listed below.   _____________  Discharge Vitals Blood pressure (!) 161/96, pulse 78, temperature 97.9 F (36.6 C), temperature source Oral, resp. rate 14, height '5\' 2"'  (1.575 m), weight 93.8 kg, SpO2 95 %.  FMesickWeights   02/18/19 1713 02/19/19 0439 02/20/19 07619  Weight: 97.4 kg 93.1 kg 93.8 kg    Labs & Radiologic Studies    CBC Recent Labs    02/18/19 2123 02/20/19 0343  WBC 9.6 7.5  NEUTROABS 5.2  --   HGB 15.1* 13.4  HCT 44.9 41.0  MCV 92.6 95.1  PLT 269 053   Basic Metabolic Panel Recent Labs    02/19/19 0333 02/20/19 0343  NA 135 139  K 3.8 4.4  CL 96* 104  CO2 25 27  GLUCOSE 182* 198*  BUN 15 16  CREATININE 0.81 0.82  CALCIUM 9.3 8.8*   Liver Function Tests Recent Labs    02/18/19 2123  AST 31  ALT 27  ALKPHOS 78  BILITOT 0.8  PROT 6.6  ALBUMIN 3.9   No results for input(s): LIPASE, AMYLASE in the last 72 hours. Cardiac Enzymes Recent Labs     02/18/19 1839 02/18/19 2119 02/19/19 0333  TROPONINI 1.14* 0.81* 0.70*   BNP Invalid input(s): POCBNP D-Dimer No results for input(s): DDIMER in the last 72 hours. Hemoglobin A1C Recent Labs    02/19/19 0332  HGBA1C 9.2*   Fasting Lipid Panel Recent Labs    02/19/19 0333  CHOL 269*  HDL 32*  LDLCALC UNABLE TO CALCULATE IF TRIGLYCERIDE OVER 400 mg/dL  TRIG 670*  CHOLHDL 8.4  LDLDIRECT 158.3*   Thyroid Function Tests No results for input(s): TSH, T4TOTAL, T3FREE, THYROIDAB in the last 72 hours.  Invalid input(s): FREET3 _____________  No results found. Disposition   Pt is being discharged home today in good condition.  Follow-up Plans & Appointments    Follow-up Information    Richardo Priest, MD Follow up on 02/27/2019.   Specialty:  Cardiology Why:  at 8:30am for your follow up appt. This will be scheduled as a virtual visit through your mobile phone.  Contact information: [place] [address] [phone]          Discharge Instructions    Amb Referral to Cardiac Rehabilitation   Complete by:  As directed    Diagnosis:   Coronary Stents NSTEMI PTCA     After initial evaluation and assessments completed: Virtual Based Care may be provided alone or in conjunction with Phase 2 Cardiac Rehab based on patient barriers.:  Yes   Call MD for:  redness, tenderness, or signs of infection (pain, swelling, redness, odor or green/yellow discharge around incision site)   Complete by:  As directed    Diet - low sodium heart healthy   Complete by:  As directed    Discharge instructions   Complete by:  As directed    Radial Site Care Refer to this sheet in the next few weeks. These instructions provide you with information on caring for yourself after your procedure. Your caregiver may also give you more specific instructions. Your treatment has been planned according to current medical practices, but problems sometimes occur. Call  your caregiver if you have any problems or questions after your procedure. HOME CARE INSTRUCTIONS You may shower the day after the procedure.Remove the bandage (dressing) and gently wash the site with plain soap and water.Gently pat the site dry.  Do not apply powder or lotion to the site.  Do not submerge the affected site in water for 3 to 5 days.  Inspect the site at least twice daily.  Do not flex or bend the affected arm for 24 hours.  No lifting over 5 pounds (2.3 kg) for 5 days after  your procedure.  Do not drive home if you are discharged the same day of the procedure. Have someone else drive you.  You may drive 24 hours after the procedure unless otherwise instructed by your caregiver.  What to expect: Any bruising will usually fade within 1 to 2 weeks.  Blood that collects in the tissue (hematoma) may be painful to the touch. It should usually decrease in size and tenderness within 1 to 2 weeks.  SEEK IMMEDIATE MEDICAL CARE IF: You have unusual pain at the radial site.  You have redness, warmth, swelling, or pain at the radial site.  You have drainage (other than a small amount of blood on the dressing).  You have chills.  You have a fever or persistent symptoms for more than 72 hours.  You have a fever and your symptoms suddenly get worse.  Your arm becomes pale, cool, tingly, or numb.  You have heavy bleeding from the site. Hold pressure on the site.   PLEASE DO NOT MISS ANY DOSES OF YOUR BRILINTA!!!!! Also keep a log of you blood pressures and bring back to your follow up appt. Please call the office with any questions.   Patients taking blood thinners should generally stay away from medicines like ibuprofen, Advil, Motrin, naproxen, and Aleve due to risk of stomach bleeding. You may take Tylenol as directed or talk to your primary doctor about alternatives.   Increase activity slowly   Complete by:  As directed        Discharge Medications     Medication List      TAKE these medications   albuterol 108 (90 Base) MCG/ACT inhaler Commonly known as:  VENTOLIN HFA Inhale 2 puffs into the lungs every 6 (six) hours as needed for wheezing or shortness of breath.   aspirin 81 MG EC tablet Take 1 tablet (81 mg total) by mouth daily. What changed:  Another medication with the same name was removed. Continue taking this medication, and follow the directions you see here.   atorvastatin 80 MG tablet Commonly known as:  LIPITOR Take 1 tablet (80 mg total) by mouth daily at 6 PM. What changed:    medication strength  how much to take  when to take this   gabapentin 400 MG capsule Commonly known as:  NEURONTIN Take 2 capsules (800 mg total) by mouth 2 (two) times daily. What changed:    how much to take  when to take this   lisinopril 40 MG tablet Commonly known as:  ZESTRIL Take 40 mg by mouth daily.   metFORMIN 1000 MG tablet Commonly known as:  GLUCOPHAGE Take 1 tablet (1,000 mg total) by mouth 2 (two) times daily with a meal. What changed:    medication strength  how much to take   metoprolol tartrate 50 MG tablet Commonly known as:  LOPRESSOR Take 1 tablet (50 mg total) by mouth 2 (two) times daily.   nicotine 21 mg/24hr patch Commonly known as:  NICODERM CQ - dosed in mg/24 hours Place 1 patch (21 mg total) onto the skin daily. Start taking on:  Feb 21, 2019   nitroGLYCERIN 0.4 MG SL tablet Commonly known as:  NITROSTAT Place 1 tablet (0.4 mg total) under the tongue every 5 (five) minutes x 3 doses as needed for chest pain.   ticagrelor 90 MG Tabs tablet Commonly known as:  BRILINTA Take 1 tablet (90 mg total) by mouth 2 (two) times daily.       Acute  coronary syndrome (MI, NSTEMI, STEMI, etc) this admission?: Yes.     AHA/ACC Clinical Performance & Quality Measures: 1. Aspirin prescribed? - Yes 2. ADP Receptor Inhibitor (Plavix/Clopidogrel, Brilinta/Ticagrelor or Effient/Prasugrel) prescribed (includes medically  managed patients)? - Yes 3. Beta Blocker prescribed? - Yes 4. High Intensity Statin (Lipitor 40-88m or Crestor 20-475m prescribed? - Yes 5. EF assessed during THIS hospitalization? - Yes 6. For EF <40%, was ACEI/ARB prescribed? - Not Applicable (EF >/= 4027%7. For EF <40%, Aldosterone Antagonist (Spironolactone or Eplerenone) prescribed? - Not Applicable (EF >/= 4051%8. Cardiac Rehab Phase II ordered (Included Medically managed Patients)? - Yes      Outstanding Labs/Studies   BMET in 2-3 weeks. Hgb A1c in 3 months as her metformin was increased.   FLP/LFTs in 6 weeks if tolerating statin.  Duration of Discharge Encounter   Greater than 30 minutes including physician time.  Signed, LiReino BellisP-C 02/20/2019, 10:55 AM   Patient seen and examined and history reviewed. Agree with above findings and plan. Patient is doing very well today. Ambulating in the halls without angina. She is motivated to quit smoking.  Exam reveals no hematoma at cath site. Lungs are clear. VSS. No gallop.   She is stable for DC today with plans noted above.  Cleon Thoma JoMartiniqueMDMoscow/29/2020 11:29 AM

## 2019-02-26 NOTE — Progress Notes (Deleted)
Cardiology Office Note:    Date:  02/27/2019   ID:  Kristen Murphy, DOB 08/17/1966, MRN [id number]  PCP:  No primary care provider on file.  Cardiologist:  Shirlee More, MD    Referring MD: No ref. provider found    ASSESSMENT:    1. Essential hypertension   2. CAD in native artery   3. Dyslipidemia (high LDL; low HDL)   4. Non-ST elevation (NSTEMI) myocardial infarction ([place])   5. Type 2 diabetes mellitus with diabetic peripheral angiopathy without gangrene, without long-term current use of insulin (HCC)    PLAN:    In order of problems listed above:  1. ***   Next appointment: ***   Medication Adjustments/Labs and Tests Ordered: Current medicines are reviewed at length with the patient today.  Concerns regarding medicines are outlined above.  No orders of the defined types were placed in this encounter.  No orders of the defined types were placed in this encounter.   No chief complaint on file.   History of Present Illness:    Kristen Murphy is a 53 y.o. female with a hx of non-ST segment elevation MI in 02/18/2018 with PCI and stent to the right coronary artery last seen at discharge by Dr. Martinique [facility].  She was discharged to [facility] by Dr. Peter Martinique 02/20/2019 after presentation with non-ST segment elevation MI.  She underwent left heart catheterization 02/19/2019 with PCI and stent of the proximal left anterior descending coronary artery and mid RCA with a normal ejection fraction she had residual disease moderate in the left-sided PDA in the right coronary artery small nondominant was diffusely diseased recommendation was for continuous dual antiplatelet therapy aspirin and Brilinta for 12 months beta-blocker high-dose statin therapy and risk factor modification.  Echocardiogram performed showed an ejection fraction greater than 65% there is no segmental left ventricular dysfunction and the thoracic aorta was normal.  Her risk factors  included hypertension hyperlipidemia type 2 diabetes mellitus COPD with continued tobacco use asthma and vascular disease with multiple TIAs and a left carotid artery occlusion.  Her peak troponin was 1.14 and EKG 02/20/2019 showed sinus rhythm and profound ischemic precordial T wave inversion and QT prolongation.  Compliance with diet, lifestyle and medications: *** Past Medical History:  Diagnosis Date  . Anemia   . Anxiety   . Arthritis   . Asthma   . Chronic kidney disease   . Coronary artery disease   . Depression   . Diabetes mellitus without complication ([place])   . Dyspnea   . Headache   . Heart murmur   . Hypertension   . Seizures ([place])   . Stroke [facility])     Past Surgical History:  Procedure Laterality Date  . CORONARY STENT INTERVENTION N/A 02/19/2019   Procedure: CORONARY STENT INTERVENTION;  Surgeon: Lorretta Harp, MD;  Location: [place] [facility];  Service: Cardiovascular;  Laterality: N/A;  . LEFT HEART CATH AND CORONARY ANGIOGRAPHY N/A 02/19/2019   Procedure: LEFT HEART CATH AND CORONARY ANGIOGRAPHY;  Surgeon: Lorretta Harp, MD;  Location: [place] [facility];  Service: Cardiovascular;  Laterality: N/A;  . TONSILLECTOMY      Current Medications: No outpatient medications have been marked as taking for the 02/27/19 encounter (Appointment) with Richardo Priest, MD.     Allergies:   Sulfa antibiotics and Effexor [venlafaxine]   Social History   Socioeconomic History  . Marital status: Married    Spouse name: Not  on file  . Number of children: Not on file  . Years of education: Not on file  . Highest education level: Not on file  Occupational History  . Not on file  Social Needs  . Financial resource strain: Not on file  . Food insecurity:    Worry: Not on file    Inability: Not on file  . Transportation needs:    Medical: Not on file    Non-medical: Not on file  Tobacco Use  . Smoking status: Former Smoker    Packs/day: 3.00    Years: 38.00     Pack years: 114.00    Last attempt to quit: 02/18/2019    Years since quitting: 0.0  . Smokeless tobacco: Never Used  Substance and Sexual Activity  . Alcohol use: Not on file  . Drug use: Yes    Types: Cocaine, Benzodiazepines, Amphetamines, "Crack" cocaine, Marijuana, Opium, Methylphenidate, Heroin  . Sexual activity: Not Currently  Lifestyle  . Physical activity:    Days per week: Not on file    Minutes per session: Not on file  . Stress: Not on file  Relationships  . Social connections:    Talks on phone: Not on file    Gets together: Not on file    Attends religious service: Not on file    Active member of club or organization: Not on file    Attends meetings of clubs or organizations: Not on file    Relationship status: Not on file  Other Topics Concern  . Not on file  Social History Narrative  . Not on file     Family History: The patient's ***family history is not on file. ROS:   Please see the history of present illness.    All other systems reviewed and are negative.  EKGs/Labs/Other Studies Reviewed:    The following studies were reviewed today:  EKG:  EKG ordered today and personally reviewed.  The ekg ordered today demonstrates ***  Recent Labs: 02/18/2019: ALT 27 02/20/2019: BUN 16; Creatinine, Ser 0.82; Hemoglobin 13.4; Platelets 213; Potassium 4.4; Sodium 139  Recent Lipid Panel    Component Value Date/Time   CHOL 269 (H) 02/19/2019 0333   TRIG 670 (H) 02/19/2019 0333   HDL 32 (L) 02/19/2019 0333   CHOLHDL 8.4 02/19/2019 0333   VLDL UNABLE TO CALCULATE IF TRIGLYCERIDE OVER 400 mg/dL 02/19/2019 0333   LDLCALC UNABLE TO CALCULATE IF TRIGLYCERIDE OVER 400 mg/dL 02/19/2019 0333   LDLDIRECT 158.3 (H) 02/19/2019 0333    Physical Exam:    VS:  There were no vitals taken for this visit.    Wt Readings from Last 3 Encounters:  02/20/19 206 lb 11.2 oz (93.8 kg)     GEN: *** Well nourished, well developed in no acute distress HEENT: Normal NECK: No  JVD; No carotid bruits LYMPHATICS: No lymphadenopathy CARDIAC: ***RRR, no murmurs, rubs, gallops RESPIRATORY:  Clear to auscultation without rales, wheezing or rhonchi  ABDOMEN: Soft, non-tender, non-distended MUSCULOSKELETAL:  No edema; No deformity  SKIN: Warm and dry NEUROLOGIC:  Alert and oriented x 3 PSYCHIATRIC:  Normal affect    Signed, Shirlee More, MD  02/27/2019 9:06 AM    [place]

## 2019-02-27 ENCOUNTER — Telehealth: Payer: Medicaid Other | Admitting: Cardiology

## 2019-02-27 DIAGNOSIS — I2510 Atherosclerotic heart disease of native coronary artery without angina pectoris: Secondary | ICD-10-CM | POA: Insufficient documentation

## 2019-02-27 DIAGNOSIS — E785 Hyperlipidemia, unspecified: Secondary | ICD-10-CM | POA: Insufficient documentation

## 2019-06-10 ENCOUNTER — Encounter: Payer: Self-pay | Admitting: Cardiology

## 2019-06-10 ENCOUNTER — Other Ambulatory Visit: Payer: Self-pay

## 2019-06-10 ENCOUNTER — Ambulatory Visit (INDEPENDENT_AMBULATORY_CARE_PROVIDER_SITE_OTHER): Payer: Medicaid Other | Admitting: Cardiology

## 2019-06-10 VITALS — BP 206/120 | HR 63 | Ht 62.0 in | Wt 199.8 lb

## 2019-06-10 DIAGNOSIS — I2510 Atherosclerotic heart disease of native coronary artery without angina pectoris: Secondary | ICD-10-CM | POA: Diagnosis not present

## 2019-06-10 DIAGNOSIS — I160 Hypertensive urgency: Secondary | ICD-10-CM

## 2019-06-10 DIAGNOSIS — I10 Essential (primary) hypertension: Secondary | ICD-10-CM

## 2019-06-10 DIAGNOSIS — F17200 Nicotine dependence, unspecified, uncomplicated: Secondary | ICD-10-CM

## 2019-06-10 DIAGNOSIS — N189 Chronic kidney disease, unspecified: Secondary | ICD-10-CM

## 2019-06-10 DIAGNOSIS — E1122 Type 2 diabetes mellitus with diabetic chronic kidney disease: Secondary | ICD-10-CM

## 2019-06-10 NOTE — Progress Notes (Signed)
Cardiology Office Note:    Date:  06/10/2019   ID:  Kristen Murphy, DOB [birth year], MRN [id number]  PCP:  Maylon Cos, NP  Cardiologist:  No primary care provider on file.  Electrophysiologist:  None   Referring MD: Maylon Cos, NP   No chief complaint on file.   History of Present Illness:    Kristen Murphy is a 53 y.o. female with a hx of coronary artery disease status post stent to the LAD in June 2020, hypertension, diabetes mellitus type 2, anxiety, current smoker who presents today for initial evaluation.  The patient reports that she has been under a lot of stress.  However notes that she has been taking her medication.  Past Medical History:  Diagnosis Date   Anemia    Anxiety    Arthritis    Asthma    Chronic kidney disease    Coronary artery disease    Depression    Diabetes mellitus without complication ([place])    Dyspnea    Headache    Heart murmur    Hypertension    Seizures ([place])    Stroke [facility])     Past Surgical History:  Procedure Laterality Date   CARDIAC CATHETERIZATION     CORONARY STENT INTERVENTION N/A 02/19/2019   Procedure: CORONARY STENT INTERVENTION;  Surgeon: Lorretta Harp, MD;  Location: [place] [facility];  Service: Cardiovascular;  Laterality: N/A;   LEFT HEART CATH AND CORONARY ANGIOGRAPHY N/A 02/19/2019   Procedure: LEFT HEART CATH AND CORONARY ANGIOGRAPHY;  Surgeon: Lorretta Harp, MD;  Location: [place] [facility];  Service: Cardiovascular;  Laterality: N/A;   TONSILLECTOMY      Current Medications: Current Meds  Medication Sig   albuterol (VENTOLIN HFA) 108 (90 Base) MCG/ACT inhaler Inhale 2 puffs into the lungs every 6 (six) hours as needed for wheezing or shortness of breath.   aspirin 81 MG EC tablet Take 1 tablet (81 mg total) by mouth daily.   atorvastatin (LIPITOR) 80 MG tablet Take 1 tablet (80 mg total) by mouth daily at 6 PM.   dapagliflozin propanediol (FARXIGA) 10 MG TABS tablet  Take 10 mg by mouth daily.   gabapentin (NEURONTIN) 400 MG capsule Take 400 mg by mouth 3 (three) times daily.   lisinopril (ZESTRIL) 40 MG tablet Take 40 mg by mouth daily.   metFORMIN (GLUCOPHAGE) 1000 MG tablet Take 1 tablet (1,000 mg total) by mouth 2 (two) times daily with a meal.   metoprolol tartrate (LOPRESSOR) 50 MG tablet Take 1 tablet (50 mg total) by mouth 2 (two) times daily.   nitroGLYCERIN (NITROSTAT) 0.4 MG SL tablet Place 1 tablet (0.4 mg total) under the tongue every 5 (five) minutes x 3 doses as needed for chest pain.   ticagrelor (BRILINTA) 90 MG TABS tablet Take 1 tablet (90 mg total) by mouth 2 (two) times daily.     Allergies:   Sulfa antibiotics and Effexor [venlafaxine]   Social History   Socioeconomic History   Marital status: Married    Spouse name: Not on file   Number of children: Not on file   Years of education: Not on file   Highest education level: Not on file  Occupational History   Not on file  Social Needs   Financial resource strain: Not on file   Food insecurity    Worry: Not on file    Inability: Not on file   Transportation needs    Medical: Not  on file    Non-medical: Not on file  Tobacco Use   Smoking status: Current Every Day Smoker    Packs/day: 3.00    Years: 38.00    Pack years: 114.00    Types: Cigarettes   Smokeless tobacco: Never Used  Substance and Sexual Activity   Alcohol use: Not Currently   Drug use: Not Currently    Types: Cocaine, Benzodiazepines, Amphetamines, "Crack" cocaine, Marijuana, Opium, Methylphenidate, Heroin    Comment: recovering addict   Sexual activity: Not Currently  Lifestyle   Physical activity    Days per week: Not on file    Minutes per session: Not on file   Stress: Not on file  Relationships   Social connections    Talks on phone: Not on file    Gets together: Not on file    Attends religious service: Not on file    Active member of club or organization: Not on file      Attends meetings of clubs or organizations: Not on file    Relationship status: Not on file  Other Topics Concern   Not on file  Social History Narrative   Not on file     Family History: The patient's family history includes Diabetes in her maternal uncle; Hyperlipidemia in her mother; Hypertension in her brother and mother.  ROS:     Review of Systems  Constitution: Negative for decreased appetite, fever, night sweats and weight loss.  HENT: Negative for congestion, hoarse voice and sore throat.   Eyes: Negative for blurred vision, double vision and visual disturbance.  Cardiovascular: Negative for chest pain, near-syncope and palpitations.  Respiratory: Negative for cough, shortness of breath and sputum production.   Endocrine: Negative for heat intolerance, polyphagia and polyuria.  Skin: Negative for itching, nail changes and skin cancer.  Musculoskeletal: Negative for back pain and joint pain.  Gastrointestinal: Negative for bloating, change in bowel habit and dysphagia.  Genitourinary: Negative for bladder incontinence, dysuria, genital sores and hesitancy.  Neurological: Positive for light-headedness. Negative for aphonia, focal weakness, paresthesias, seizures and weakness.  Psychiatric/Behavioral: Positive for depression. Negative for thoughts of violence. The patient is nervous/anxious. The patient does not have insomnia.   Allergic/Immunologic: Positive for HIV exposure. Negative for persistent infections.     EKGs/Labs/Other Studies Reviewed:    The following studies were reviewed today:   EKG:  The ekg ordered today demonstrates sinus rhythm, heart rate 63 bpm, evidence of or septal infarction, nonspecific ST changes.  Recent Labs: 02/18/2019: ALT 27 02/20/2019: BUN 16; Creatinine, Ser 0.82; Hemoglobin 13.4; Platelets 213; Potassium 4.4; Sodium 139  Recent Lipid Panel    Component Value Date/Time   CHOL 269 (H) 02/19/2019 0333   TRIG 670 (H) 02/19/2019  0333   HDL 32 (L) 02/19/2019 0333   CHOLHDL 8.4 02/19/2019 0333   VLDL UNABLE TO CALCULATE IF TRIGLYCERIDE OVER 400 mg/dL 02/19/2019 0333   LDLCALC UNABLE TO CALCULATE IF TRIGLYCERIDE OVER 400 mg/dL 02/19/2019 0333   LDLDIRECT 158.3 (H) 02/19/2019 0333    Physical Exam:    VS:  BP (!) 206/120 (BP Location: Left Arm, Patient Position: Sitting, Cuff Size: Large)    Pulse 63    Ht 5\' 2"  (1.575 m)    Wt 199 lb 12.8 oz (90.6 kg)    SpO2 98%    BMI 36.54 kg/m     Wt Readings from Last 3 Encounters:  06/10/19 199 lb 12.8 oz (90.6 kg)  02/20/19 206 lb 11.2 oz (93.8  kg)     GEN: obese, well nourished, well developed in no acute distress HEENT: Normal NECK: No JVD; No carotid bruits LYMPHATICS: No lymphadenopathy CARDIAC: S1-S2 noted RRR, soft II/VI holosystolic murmurs, rubs, gallops RESPIRATORY:  Clear to auscultation without rales, wheezing or rhonchi  ABDOMEN: Soft, non-tender, non-distended EXTREMITIES: No edema, no cyanosis, no clubbing MUSCULOSKELETAL:  No edema; No deformity  SKIN: Warm and dry NEUROLOGIC:  Alert and oriented x 3, nonfocal PSYCHIATRIC:  Normal affect, good insight  ASSESSMENT:    1. Essential hypertension   2. Hypertensive urgency   3. Chronic kidney disease, unspecified CKD stage   4. Type 2 diabetes mellitus with chronic kidney disease, without long-term current use of insulin, unspecified CKD stage ([place])   5. Tobacco use disorder   6. CAD in native artery    PLAN:    Today the patient is hypertensive in the office. BP manually taken by me RUE: 212/110 mmHG and 210/108 mmHG. She admits to compliance with her medication.  At this time it will be of great benefit to send the patient to the ED for IV antihypertensive and observation. I was able to discuss the case with the ED doctor. I will see her in 1 week.  She is started smoking and I counseled her against this. She is under a lot of stress at home and is hoping to be able to see a PCP to start her on  medication for her depression/anxiety.   The patient is in agreement with the above plan. FU in 1 week.    Medication Adjustments/Labs and Tests Ordered: Current medicines are reviewed at length with the patient today.  Concerns regarding medicines are outlined above.  Orders Placed This Encounter  Procedures   EKG 12-Lead   No orders of the defined types were placed in this encounter.   Patient Instructions  Medication Instructions:  Your physician recommends that you continue on your current medications as directed. Please refer to the Current Medication list given to you today.  If you need a refill on your cardiac medications before your next appointment, please call your pharmacy.   Lab work: None If you have labs (blood work) drawn today and your tests are completely normal, you will receive your results only by:  [place] (if you have MyChart) OR  A paper copy in the mail If you have any lab test that is abnormal or we need to change your treatment, we will call you to review the results.  Testing/Procedures: NOne  Follow-Up: At [facility], you and your health needs are our priority.  As part of our continuing mission to provide you with exceptional heart care, we have created designated Provider Care Teams.  These Care Teams include your primary Cardiologist (physician) and Advanced Practice Providers (APPs -  Physician Assistants and Nurse Practitioners) who all work together to provide you with the care you need, when you need it. You will need a follow up appointment in 1 weeks with Dr Harriet Masson Any Other Special Instructions Will Be Listed Below (If Applicable).   You are being sent to [facility] for Hypertensive crisis.      Rolly Pancake, DO  06/10/2019 9:07 AM    [place]

## 2019-06-10 NOTE — Patient Instructions (Signed)
Medication Instructions:  Your physician recommends that you continue on your current medications as directed. Please refer to the Current Medication list given to you today.  If you need a refill on your cardiac medications before your next appointment, please call your pharmacy.   Lab work: None If you have labs (blood work) drawn today and your tests are completely normal, you will receive your results only by: Marland Kitchen MyChart Message (if you have MyChart) OR . A paper copy in the mail If you have any lab test that is abnormal or we need to change your treatment, we will call you to review the results.  Testing/Procedures: NOne  Follow-Up: At [facility], you and your health needs are our priority.  As part of our continuing mission to provide you with exceptional heart care, we have created designated Provider Care Teams.  These Care Teams include your primary Cardiologist (physician) and Advanced Practice Providers (APPs -  Physician Assistants and Nurse Practitioners) who all work together to provide you with the care you need, when you need it. You will need a follow up appointment in 1 weeks with Dr Harriet Masson Any Other Special Instructions Will Be Listed Below (If Applicable).   You are being sent to [facility] for Hypertensive crisis.

## 2019-06-17 ENCOUNTER — Ambulatory Visit (INDEPENDENT_AMBULATORY_CARE_PROVIDER_SITE_OTHER): Payer: Medicaid Other | Admitting: Cardiology

## 2019-06-17 ENCOUNTER — Encounter: Payer: Self-pay | Admitting: Cardiology

## 2019-06-17 ENCOUNTER — Other Ambulatory Visit: Payer: Self-pay

## 2019-06-17 VITALS — BP 134/76 | HR 77 | Ht 62.0 in | Wt 199.2 lb

## 2019-06-17 DIAGNOSIS — I2510 Atherosclerotic heart disease of native coronary artery without angina pectoris: Secondary | ICD-10-CM | POA: Diagnosis not present

## 2019-06-17 DIAGNOSIS — E785 Hyperlipidemia, unspecified: Secondary | ICD-10-CM

## 2019-06-17 DIAGNOSIS — F17200 Nicotine dependence, unspecified, uncomplicated: Secondary | ICD-10-CM

## 2019-06-17 DIAGNOSIS — I10 Essential (primary) hypertension: Secondary | ICD-10-CM | POA: Diagnosis not present

## 2019-06-17 DIAGNOSIS — E1151 Type 2 diabetes mellitus with diabetic peripheral angiopathy without gangrene: Secondary | ICD-10-CM | POA: Diagnosis not present

## 2019-06-17 NOTE — Progress Notes (Signed)
Cardiology Office Note:    Date:  06/17/2019   ID:  Kristen Murphy, DOB 11/26/1965, MRN [id number]  PCP:  Maylon Cos, NP  Cardiologist:  No primary care provider on file.  Electrophysiologist:  None   Referring MD: Maylon Cos, NP   Follow-up visit  History of Present Illness:    Kristen Murphy is a 53 y.o. female with a hx of coronary artery disease status post stent to the LAD in June 2020, hypertension, diabetes mellitus type 2, anxiety, current smoker presents for follow-up visit.   I last saw patient on June 10, 2019 at which time her blood pressure was significantly elevated therefore sent her to the ED for hypertensive emergency.  She in the ED she was treated with IV antihypertensive.  On discharge amlodipine 5 mg was added to her medication regimen.   Today she is here for a visit she offers no complaints.  Past Medical History:  Diagnosis Date  . Anemia   . Anxiety   . Arthritis   . Asthma   . Chronic kidney disease   . Coronary artery disease   . Depression   . Diabetes mellitus without complication ([place])   . Dyspnea   . Headache   . Heart murmur   . Hypertension   . Seizures ([place])   . Stroke [facility])     Past Surgical History:  Procedure Laterality Date  . CARDIAC CATHETERIZATION    . CORONARY STENT INTERVENTION N/A 02/19/2019   Procedure: CORONARY STENT INTERVENTION;  Surgeon: Lorretta Harp, MD;  Location: [place] [facility];  Service: Cardiovascular;  Laterality: N/A;  . LEFT HEART CATH AND CORONARY ANGIOGRAPHY N/A 02/19/2019   Procedure: LEFT HEART CATH AND CORONARY ANGIOGRAPHY;  Surgeon: Lorretta Harp, MD;  Location: [place] [facility];  Service: Cardiovascular;  Laterality: N/A;  . TONSILLECTOMY      Current Medications: Current Meds  Medication Sig  . albuterol (VENTOLIN HFA) 108 (90 Base) MCG/ACT inhaler Inhale 2 puffs into the lungs every 6 (six) hours as needed for wheezing or shortness of breath.  Marland Kitchen amLODipine  (NORVASC) 5 MG tablet Take 5 mg by mouth daily.  Marland Kitchen aspirin 81 MG EC tablet Take 1 tablet (81 mg total) by mouth daily.  Marland Kitchen atorvastatin (LIPITOR) 80 MG tablet Take 1 tablet (80 mg total) by mouth daily at 6 PM.  . dapagliflozin propanediol (FARXIGA) 10 MG TABS tablet Take 10 mg by mouth daily.  Marland Kitchen gabapentin (NEURONTIN) 400 MG capsule Take 400 mg by mouth 3 (three) times daily.  Marland Kitchen lisinopril (ZESTRIL) 40 MG tablet Take 40 mg by mouth daily.  . metFORMIN (GLUCOPHAGE) 1000 MG tablet Take 1 tablet (1,000 mg total) by mouth 2 (two) times daily with a meal.  . metoprolol tartrate (LOPRESSOR) 50 MG tablet Take 1 tablet (50 mg total) by mouth 2 (two) times daily.  . nitroGLYCERIN (NITROSTAT) 0.4 MG SL tablet Place 1 tablet (0.4 mg total) under the tongue every 5 (five) minutes x 3 doses as needed for chest pain.  . ticagrelor (BRILINTA) 90 MG TABS tablet Take 1 tablet (90 mg total) by mouth 2 (two) times daily.     Allergies:   Sulfa antibiotics and Effexor [venlafaxine]   Social History   Socioeconomic History  . Marital status: Married    Spouse name: Not on file  . Number of children: Not on file  . Years of education: Not on file  . Highest education level: Not  on file  Occupational History  . Not on file  Social Needs  . Financial resource strain: Not on file  . Food insecurity    Worry: Not on file    Inability: Not on file  . Transportation needs    Medical: Not on file    Non-medical: Not on file  Tobacco Use  . Smoking status: Current Every Day Smoker    Packs/day: 3.00    Years: 38.00    Pack years: 114.00    Types: Cigarettes  . Smokeless tobacco: Never Used  Substance and Sexual Activity  . Alcohol use: Not Currently  . Drug use: Not Currently    Types: Cocaine, Benzodiazepines, Amphetamines, "Crack" cocaine, Marijuana, Opium, Methylphenidate, Heroin    Comment: recovering addict  . Sexual activity: Not Currently  Lifestyle  . Physical activity    Days per week: Not  on file    Minutes per session: Not on file  . Stress: Not on file  Relationships  . Social Herbalist on phone: Not on file    Gets together: Not on file    Attends religious service: Not on file    Active member of club or organization: Not on file    Attends meetings of clubs or organizations: Not on file    Relationship status: Not on file  Other Topics Concern  . Not on file  Social History Narrative  . Not on file     Family History: The patient's family history includes Diabetes in her maternal uncle; Hyperlipidemia in her mother; Hypertension in her brother and mother.  ROS:   Review of Systems  Constitution: Negative for decreased appetite, fever and weight gain.  HENT: Negative for congestion, ear discharge, hoarse voice and sore throat.   Eyes: Negative for discharge, redness, vision loss in right eye and visual halos.  Cardiovascular: Negative for chest pain, dyspnea on exertion, leg swelling, orthopnea and palpitations.  Respiratory: Negative for cough, hemoptysis, shortness of breath and snoring.   Endocrine: Negative for heat intolerance and polyphagia.  Hematologic/Lymphatic: Negative for bleeding problem. Does not bruise/bleed easily.  Skin: Negative for flushing, nail changes, rash and suspicious lesions.  Musculoskeletal: Negative for arthritis, joint pain, muscle cramps, myalgias, neck pain and stiffness.  Gastrointestinal: Negative for abdominal pain, bowel incontinence, diarrhea and excessive appetite.  Genitourinary: Negative for decreased libido, genital sores and incomplete emptying.  Neurological: Negative for brief paralysis, focal weakness, headaches and loss of balance.  Psychiatric/Behavioral: Negative for altered mental status, depression and suicidal ideas.  Allergic/Immunologic: Negative for HIV exposure and persistent infections.    EKGs/Labs/Other Studies Reviewed:    The following studies were reviewed today:   EKG: None  performed today.  Transthoracic echocardiogram performed Feb 19, 2019 IMPRESSIONS  1. The left ventricle has hyperdynamic systolic function, with an ejection fraction of >65%. The cavity size was normal. Left ventricular diastolic Doppler parameters are consistent with impaired relaxation. No evidence of left ventricular regional wall  motion abnormalities.  2. The right ventricle has normal systolic function. The cavity was normal. There is no increase in right ventricular wall thickness.  3. The aortic root is normal in size and structure.  Recent Labs: 02/18/2019: ALT 27 02/20/2019: BUN 16; Creatinine, Ser 0.82; Hemoglobin 13.4; Platelets 213; Potassium 4.4; Sodium 139  Recent Lipid Panel    Component Value Date/Time   CHOL 269 (H) 02/19/2019 0333   TRIG 670 (H) 02/19/2019 0333   HDL 32 (L) 02/19/2019 [id number]  CHOLHDL 8.4 02/19/2019 0333   VLDL UNABLE TO CALCULATE IF TRIGLYCERIDE OVER 400 mg/dL 02/19/2019 0333   LDLCALC UNABLE TO CALCULATE IF TRIGLYCERIDE OVER 400 mg/dL 02/19/2019 0333   LDLDIRECT 158.3 (H) 02/19/2019 0333    Physical Exam:    VS:  BP 134/76 (BP Location: Left Arm, Patient Position: Sitting, Cuff Size: Large)   Pulse 77   Ht 5\' 2"  (1.575 m)   Wt 199 lb 3.2 oz (90.4 kg)   SpO2 96%   BMI 36.43 kg/m     Wt Readings from Last 3 Encounters:  06/17/19 199 lb 3.2 oz (90.4 kg)  06/10/19 199 lb 12.8 oz (90.6 kg)  02/20/19 206 lb 11.2 oz (93.8 kg)     GEN: Well nourished, well developed in no acute distress HEENT: Normal NECK: No JVD; No carotid bruits LYMPHATICS: No lymphadenopathy CARDIAC: S1S2 noted,RRR, no murmurs, rubs, gallops RESPIRATORY:  Clear to auscultation without rales, wheezing or rhonchi  ABDOMEN: Soft, non-tender, non-distended, +bowel sounds, no guarding. EXTREMITIES: No edema, No cyanosis, no clubbing MUSCULOSKELETAL:  No edema; No deformity  SKIN: Warm and dry NEUROLOGIC:  Alert and oriented x 3, non-focal PSYCHIATRIC:  Normal affect, good  insight  ASSESSMENT:    1. Essential hypertension   2. Type 2 diabetes mellitus with diabetic peripheral angiopathy without gangrene, without long-term current use of insulin ([place])   3. Tobacco use disorder   4. CAD in native artery   5. Dyslipidemia (high LDL; low HDL)    PLAN:    In order of problems listed above:  1.  Her blood pressure has improved significantly.  She tells me that she is exercising more, she has cut back on smoking.  Therefore now going to keep her on the lisinopril 40 mg daily, metoprolol 50 mg every 12 hours, amlodipine 5 mg daily.   2.  She is on atorvastatin will repeat her lipid profile at her next visit.  If there seems to be no improvement Zetia will be added to her regimen.  She was again educated to continue her dual antiplatelet therapy with plans of continuing this up until May 2021.   3.  She notes that she has cut back on her smoking however the patient was counseled on tobacco cessation today for 5 minutes.  Counseling included reviewing the risks of smoking tobacco products, how it impacts the patient's current medical diagnoses and different strategies for quitting.  Pharmacotherapy to aid in tobacco cessation was not prescribed today. The patient coordinate with  primary care provider.  The patient was also advised to call  1-800-QUIT-NOW ([phone] [phone]) for additional help with quitting smoking.  The patient is in agreement with the above plan. The patient left the office in stable condition.  The patient will follow up in 3 months.   Medication Adjustments/Labs and Tests Ordered: Current medicines are reviewed at length with the patient today.  Concerns regarding medicines are outlined above.  No orders of the defined types were placed in this encounter.  No orders of the defined types were placed in this encounter.     Adopting a Healthy Lifestyle.  Know what a healthy weight is for you (roughly BMI <25) and aim to maintain this   Aim for  7+ servings of fruits and vegetables daily   65-80+ fluid ounces of water or unsweet tea for healthy kidneys   Limit to max 1 drink of alcohol per day; avoid smoking/tobacco   Limit animal fats in diet for cholesterol and heart  health - choose grass fed whenever available   Avoid highly processed foods, and foods high in saturated/trans fats   Aim for low stress - take time to unwind and care for your mental health   Aim for 150 min of moderate intensity exercise weekly for heart health, and weights twice weekly for bone health   Aim for 7-9 hours of sleep daily   When it comes to diets, agreement about the perfect plan isnt easy to find, even among the experts. Experts at the [employer] developed an idea known as the Healthy Eating Plate. Just imagine a plate divided into logical, healthy portions.   The emphasis is on diet quality:   Load up on vegetables and fruits - one-half of your plate: Aim for color and variety, and remember that potatoes dont count.   Go for whole grains - one-quarter of your plate: Whole wheat, barley, wheat berries, quinoa, oats, brown rice, and foods made with them. If you want pasta, go with whole wheat pasta.   Protein power - one-quarter of your plate: Fish, chicken, beans, and nuts are all healthy, versatile protein sources. Limit red meat.   The diet, however, does go beyond the plate, offering a few other suggestions.   Use healthy plant oils, such as olive, canola, soy, corn, sunflower and peanut. Check the labels, and avoid partially hydrogenated oil, which have unhealthy trans fats.   If youre thirsty, drink water. Coffee and tea are good in moderation, but skip sugary drinks and limit milk and dairy products to one or two daily servings.   The type of carbohydrate in the diet is more important than the amount. Some sources of carbohydrates, such as vegetables, fruits, whole grains, and beans-are healthier than others.    Finally, stay active  Signed, Berniece Salines, DO  06/17/2019 11:32 AM    [place]

## 2019-06-17 NOTE — Patient Instructions (Signed)

## 2019-07-07 ENCOUNTER — Other Ambulatory Visit: Payer: Self-pay | Admitting: Cardiology

## 2019-07-07 MED ORDER — AMLODIPINE BESYLATE 5 MG PO TABS: 5.0000 mg | ORAL_TABLET | Freq: Every day | ORAL | 3 refills | Status: DC

## 2019-07-07 NOTE — Addendum Note (Signed)
Addended by: Stevan Born on: 07/07/2019 04:06 PM   Modules accepted: Orders

## 2019-07-07 NOTE — Telephone Encounter (Signed)
Norvasc (amlodipine) 5mg  one tablet daily was sent to [facility] as requested.

## 2019-07-07 NOTE — Telephone Encounter (Signed)
Call norvasc to [place]

## 2019-09-21 ENCOUNTER — Ambulatory Visit: Payer: Medicaid Other | Admitting: Cardiology

## 2019-10-07 ENCOUNTER — Other Ambulatory Visit: Payer: Self-pay

## 2019-10-07 ENCOUNTER — Encounter: Payer: Self-pay | Admitting: Cardiology

## 2019-10-07 ENCOUNTER — Ambulatory Visit (INDEPENDENT_AMBULATORY_CARE_PROVIDER_SITE_OTHER): Payer: Medicaid Other | Admitting: Cardiology

## 2019-10-07 VITALS — BP 138/78 | HR 84 | Ht 62.0 in | Wt 195.8 lb

## 2019-10-07 DIAGNOSIS — E1151 Type 2 diabetes mellitus with diabetic peripheral angiopathy without gangrene: Secondary | ICD-10-CM

## 2019-10-07 DIAGNOSIS — E785 Hyperlipidemia, unspecified: Secondary | ICD-10-CM

## 2019-10-07 DIAGNOSIS — I10 Essential (primary) hypertension: Secondary | ICD-10-CM

## 2019-10-07 DIAGNOSIS — F17200 Nicotine dependence, unspecified, uncomplicated: Secondary | ICD-10-CM | POA: Diagnosis not present

## 2019-10-07 DIAGNOSIS — I2510 Atherosclerotic heart disease of native coronary artery without angina pectoris: Secondary | ICD-10-CM

## 2019-10-07 MED ORDER — AMLODIPINE BESYLATE 10 MG PO TABS: 10.0000 mg | ORAL_TABLET | Freq: Every day | ORAL | 3 refills | Status: DC

## 2019-10-07 MED ORDER — AMLODIPINE BESYLATE 10 MG PO TABS: 10.0000 mg | ORAL_TABLET | Freq: Every day | ORAL | 1 refills | Status: DC

## 2019-10-07 NOTE — Progress Notes (Signed)
Cardiology Office Note:    Date:  10/07/2019   ID:  Kristen Murphy, DOB 02-Dec-1965, MRN [id number]  PCP:  Maylon Cos, NP  Cardiologist:  Berniece Salines, DO  Electrophysiologist:  None   Referring MD: Maylon Cos, NP   Follow-up visit for hypertension  History of Present Illness:    Kristen Murphy is a 54 y.o. female with a hx of coronary artery disease status post stent to the LAD in June 2020, hypertension, diabetes mellitus type 2, anxiety,current smoker presents for follow-up visit.   I saw patient on June 10, 2019 at which time her blood pressure was significantly elevated therefore sent her to the ED for hypertensive emergency.  She in the ED she was treated with IV antihypertensive.  On discharge amlodipine 5 mg was added to her medication regimen.  She presented for a follow up visit on  06/17/2019, at that time her blood pressure was improved on lisinopril 40 mg daily, metoprolol 50 mg every 12 hours and amlodipine 5 mg daily.  Discussed smoking cessation the patient tells me that she was trying to quit smoking.  In the interim she did get the Covid 19 infection and has recovered well.  Past Medical History:  Diagnosis Date  . Anemia   . Anxiety   . Arthritis   . Asthma   . Chronic kidney disease   . Coronary artery disease   . Depression   . Diabetes mellitus without complication ([place])   . Dyspnea   . Headache   . Heart murmur   . Hypertension   . Seizures ([place])   . Stroke [facility])     Past Surgical History:  Procedure Laterality Date  . CARDIAC CATHETERIZATION    . CORONARY STENT INTERVENTION N/A 02/19/2019   Procedure: CORONARY STENT INTERVENTION;  Surgeon: Lorretta Harp, MD;  Location: [place] [facility];  Service: Cardiovascular;  Laterality: N/A;  . LEFT HEART CATH AND CORONARY ANGIOGRAPHY N/A 02/19/2019   Procedure: LEFT HEART CATH AND CORONARY ANGIOGRAPHY;  Surgeon: Lorretta Harp, MD;  Location: [place] [facility];  Service:  Cardiovascular;  Laterality: N/A;  . TONSILLECTOMY      Current Medications: Current Meds  Medication Sig  . albuterol (VENTOLIN HFA) 108 (90 Base) MCG/ACT inhaler Inhale 2 puffs into the lungs every 6 (six) hours as needed for wheezing or shortness of breath.  Marland Kitchen aspirin 81 MG EC tablet Take 1 tablet (81 mg total) by mouth daily.  Marland Kitchen atorvastatin (LIPITOR) 80 MG tablet Take 1 tablet (80 mg total) by mouth daily at 6 PM.  . dapagliflozin propanediol (FARXIGA) 10 MG TABS tablet Take 10 mg by mouth daily.  Marland Kitchen gabapentin (NEURONTIN) 400 MG capsule Take 400 mg by mouth 3 (three) times daily.  Marland Kitchen lisinopril (ZESTRIL) 40 MG tablet Take 40 mg by mouth daily.  . metFORMIN (GLUCOPHAGE) 1000 MG tablet Take 1 tablet (1,000 mg total) by mouth 2 (two) times daily with a meal.  . metoprolol tartrate (LOPRESSOR) 50 MG tablet Take 1 tablet (50 mg total) by mouth 2 (two) times daily.  . nitroGLYCERIN (NITROSTAT) 0.4 MG SL tablet Place 1 tablet (0.4 mg total) under the tongue every 5 (five) minutes x 3 doses as needed for chest pain.  . ticagrelor (BRILINTA) 90 MG TABS tablet Take 1 tablet (90 mg total) by mouth 2 (two) times daily.  . [DISCONTINUED] amLODipine (NORVASC) 5 MG tablet Take 1 tablet (5 mg total) by mouth daily.  Allergies:   Sulfa antibiotics and Effexor [venlafaxine]   Social History   Socioeconomic History  . Marital status: Married    Spouse name: Not on file  . Number of children: Not on file  . Years of education: Not on file  . Highest education level: Not on file  Occupational History  . Not on file  Tobacco Use  . Smoking status: Current Every Day Smoker    Packs/day: 3.00    Years: 38.00    Pack years: 114.00    Types: Cigarettes  . Smokeless tobacco: Never Used  Substance and Sexual Activity  . Alcohol use: Not Currently  . Drug use: Not Currently    Types: Cocaine, Benzodiazepines, Amphetamines, "Crack" cocaine, Marijuana, Opium, Methylphenidate, Heroin    Comment:  recovering addict  . Sexual activity: Not Currently  Other Topics Concern  . Not on file  Social History Narrative  . Not on file   Social Determinants of Health   Financial Resource Strain:   . Difficulty of Paying Living Expenses: Not on file  Food Insecurity:   . Worried About Charity fundraiser in the Last Year: Not on file  . Ran Out of Food in the Last Year: Not on file  Transportation Needs:   . Lack of Transportation (Medical): Not on file  . Lack of Transportation (Non-Medical): Not on file  Physical Activity:   . Days of Exercise per Week: Not on file  . Minutes of Exercise per Session: Not on file  Stress:   . Feeling of Stress : Not on file  Social Connections:   . Frequency of Communication with Friends and Family: Not on file  . Frequency of Social Gatherings with Friends and Family: Not on file  . Attends Religious Services: Not on file  . Active Member of Clubs or Organizations: Not on file  . Attends Archivist Meetings: Not on file  . Marital Status: Not on file     Family History: The patient's family history includes Diabetes in her maternal uncle; Hyperlipidemia in her mother; Hypertension in her brother and mother.  ROS:   Review of Systems  Constitution: Negative for decreased appetite, fever and weight gain.  HENT: Negative for congestion, ear discharge, hoarse voice and sore throat.   Eyes: Negative for discharge, redness, vision loss in right eye and visual halos.  Cardiovascular: Negative for chest pain, dyspnea on exertion, leg swelling, orthopnea and palpitations.  Respiratory: Negative for cough, hemoptysis, shortness of breath and snoring.   Endocrine: Negative for heat intolerance and polyphagia.  Hematologic/Lymphatic: Negative for bleeding problem. Does not bruise/bleed easily.  Skin: Negative for flushing, nail changes, rash and suspicious lesions.  Musculoskeletal: Negative for arthritis, joint pain, muscle cramps, myalgias,  neck pain and stiffness.  Gastrointestinal: Negative for abdominal pain, bowel incontinence, diarrhea and excessive appetite.  Genitourinary: Negative for decreased libido, genital sores and incomplete emptying.  Neurological: Negative for brief paralysis, focal weakness, headaches and loss of balance.  Psychiatric/Behavioral: Negative for altered mental status, depression and suicidal ideas.  Allergic/Immunologic: Negative for HIV exposure and persistent infections.    EKGs/Labs/Other Studies Reviewed:    The following studies were reviewed today:   EKG: None today.  Recent Labs: 02/18/2019: ALT 27 02/20/2019: BUN 16; Creatinine, Ser 0.82; Hemoglobin 13.4; Platelets 213; Potassium 4.4; Sodium 139  Recent Lipid Panel    Component Value Date/Time   CHOL 269 (H) 02/19/2019 0333   TRIG 670 (H) 02/19/2019 0333   HDL  32 (L) 02/19/2019 0333   CHOLHDL 8.4 02/19/2019 0333   VLDL UNABLE TO CALCULATE IF TRIGLYCERIDE OVER 400 mg/dL 02/19/2019 0333   LDLCALC UNABLE TO CALCULATE IF TRIGLYCERIDE OVER 400 mg/dL 02/19/2019 0333   LDLDIRECT 158.3 (H) 02/19/2019 0333    Physical Exam:    VS:  BP 138/78 (BP Location: Right Arm)   Pulse 84   Ht 5\' 2"  (1.575 m)   Wt 195 lb 12.8 oz (88.8 kg)   BMI 35.81 kg/m     Wt Readings from Last 3 Encounters:  10/07/19 195 lb 12.8 oz (88.8 kg)  06/17/19 199 lb 3.2 oz (90.4 kg)  06/10/19 199 lb 12.8 oz (90.6 kg)     GEN: Well nourished, well developed in no acute distress HEENT: Normal NECK: No JVD; No carotid bruits LYMPHATICS: No lymphadenopathy CARDIAC: S1S2 noted,RRR, no murmurs, rubs, gallops RESPIRATORY:  Clear to auscultation without rales, wheezing or rhonchi  ABDOMEN: Soft, non-tender, non-distended, +bowel sounds, no guarding. EXTREMITIES: No edema, No cyanosis, no clubbing MUSCULOSKELETAL:  No edema; No deformity  SKIN: Warm and dry NEUROLOGIC:  Alert and oriented x 3, non-focal PSYCHIATRIC:  Normal affect, good insight  ASSESSMENT:      1. Essential hypertension    PLAN:     1.  Her blood pressure is improved but not at target.  Therefore I am going to increase her amlodipine from 5 mg daily to 10 mg daily.  She is planning to see her gynecologist for possible hysterectomy.  2.  Her Rosanne Gutting artery disease continue patient on her aspirin 81 mg daily, Brilinta 90 mg twice a day, atorvastatin 80 mg daily.  3.  Hyperlipidemia continue patient on atorvastatin.  4.  Diabetes mellitus continue her current medication regimen.  5.  Smoking cessation advised.  The patient is in agreement with the above plan. The patient left the office in stable condition.  The patient will follow up in 1 month blood pressure check.   Medication Adjustments/Labs and Tests Ordered: Current medicines are reviewed at length with the patient today.  Concerns regarding medicines are outlined above.  No orders of the defined types were placed in this encounter.  Meds ordered this encounter  Medications  . DISCONTD: amLODipine (NORVASC) 10 MG tablet    Sig: Take 1 tablet (10 mg total) by mouth daily.    Dispense:  180 tablet    Refill:  3  . amLODipine (NORVASC) 10 MG tablet    Sig: Take 1 tablet (10 mg total) by mouth daily.    Dispense:  90 tablet    Refill:  1    Patient Instructions  Medication Instructions:  Your physician has recommended you make the following change in your medication:   INCREASE: Amlodipine to 10 mg Take 1 daily (may take 2 tabs of 5 mg until you run out)  *If you need a refill on your cardiac medications before your next appointment, please call your pharmacy*  Lab Work: None If you have labs (blood work) drawn today and your tests are completely normal, you will receive your results only by: Marland Kitchen MyChart Message (if you have MyChart) OR . A paper copy in the mail If you have any lab test that is abnormal or we need to change your treatment, we will call you to review the  results.  Testing/Procedures: None  Follow-Up: At [facility], you and your health needs are our priority.  As part of our continuing mission to provide you with exceptional heart  care, we have created designated Provider Care Teams.  These Care Teams include your primary Cardiologist (physician) and Advanced Practice Providers (APPs -  Physician Assistants and Nurse Practitioners) who all work together to provide you with the care you need, when you need it.  Your next appointment:   1 month(s)  The format for your next appointment:   In Person  Provider:   Berniece Salines, DO  Other Instructions      Adopting a Healthy Lifestyle.  Know what a healthy weight is for you (roughly BMI <25) and aim to maintain this   Aim for 7+ servings of fruits and vegetables daily   65-80+ fluid ounces of water or unsweet tea for healthy kidneys   Limit to max 1 drink of alcohol per day; avoid smoking/tobacco   Limit animal fats in diet for cholesterol and heart health - choose grass fed whenever available   Avoid highly processed foods, and foods high in saturated/trans fats   Aim for low stress - take time to unwind and care for your mental health   Aim for 150 min of moderate intensity exercise weekly for heart health, and weights twice weekly for bone health   Aim for 7-9 hours of sleep daily   When it comes to diets, agreement about the perfect plan isnt easy to find, even among the experts. Experts at the [employer] developed an idea known as the Healthy Eating Plate. Just imagine a plate divided into logical, healthy portions.   The emphasis is on diet quality:   Load up on vegetables and fruits - one-half of your plate: Aim for color and variety, and remember that potatoes dont count.   Go for whole grains - one-quarter of your plate: Whole wheat, barley, wheat berries, quinoa, oats, brown rice, and foods made with them. If you want pasta, go with whole  wheat pasta.   Protein power - one-quarter of your plate: Fish, chicken, beans, and nuts are all healthy, versatile protein sources. Limit red meat.   The diet, however, does go beyond the plate, offering a few other suggestions.   Use healthy plant oils, such as olive, canola, soy, corn, sunflower and peanut. Check the labels, and avoid partially hydrogenated oil, which have unhealthy trans fats.   If youre thirsty, drink water. Coffee and tea are good in moderation, but skip sugary drinks and limit milk and dairy products to one or two daily servings.   The type of carbohydrate in the diet is more important than the amount. Some sources of carbohydrates, such as vegetables, fruits, whole grains, and beans-are healthier than others.   Finally, stay active  Signed, Berniece Salines, DO  10/07/2019 3:22 PM    [place]

## 2019-10-07 NOTE — Patient Instructions (Signed)
Medication Instructions:  Your physician has recommended you make the following change in your medication:   INCREASE: Amlodipine to 10 mg Take 1 daily (may take 2 tabs of 5 mg until you run out)  *If you need a refill on your cardiac medications before your next appointment, please call your pharmacy*  Lab Work: None If you have labs (blood work) drawn today and your tests are completely normal, you will receive your results only by: Marland Kitchen MyChart Message (if you have MyChart) OR . A paper copy in the mail If you have any lab test that is abnormal or we need to change your treatment, we will call you to review the results.  Testing/Procedures: None  Follow-Up: At [facility], you and your health needs are our priority.  As part of our continuing mission to provide you with exceptional heart care, we have created designated Provider Care Teams.  These Care Teams include your primary Cardiologist (physician) and Advanced Practice Providers (APPs -  Physician Assistants and Nurse Practitioners) who all work together to provide you with the care you need, when you need it.  Your next appointment:   1 month(s)  The format for your next appointment:   In Person  Provider:   Berniece Salines, DO  Other Instructions

## 2019-11-09 ENCOUNTER — Ambulatory Visit: Payer: Medicaid Other | Admitting: Cardiology

## 2019-12-07 ENCOUNTER — Ambulatory Visit: Payer: Medicaid Other | Admitting: Cardiology

## 2019-12-07 ENCOUNTER — Other Ambulatory Visit: Payer: Self-pay

## 2019-12-07 ENCOUNTER — Encounter: Payer: Self-pay | Admitting: Cardiology

## 2019-12-07 VITALS — BP 134/78 | HR 68 | Ht 62.0 in | Wt 199.0 lb

## 2019-12-07 DIAGNOSIS — F17200 Nicotine dependence, unspecified, uncomplicated: Secondary | ICD-10-CM

## 2019-12-07 DIAGNOSIS — I2510 Atherosclerotic heart disease of native coronary artery without angina pectoris: Secondary | ICD-10-CM

## 2019-12-07 DIAGNOSIS — E1151 Type 2 diabetes mellitus with diabetic peripheral angiopathy without gangrene: Secondary | ICD-10-CM

## 2019-12-07 DIAGNOSIS — I679 Cerebrovascular disease, unspecified: Secondary | ICD-10-CM

## 2019-12-07 DIAGNOSIS — I10 Essential (primary) hypertension: Secondary | ICD-10-CM

## 2019-12-07 DIAGNOSIS — E785 Hyperlipidemia, unspecified: Secondary | ICD-10-CM

## 2019-12-07 NOTE — Patient Instructions (Signed)
Medication Instructions:  Your physician recommends that you continue on your current medications as directed. Please refer to the Current Medication list given to you today.  *If you need a refill on your cardiac medications before your next appointment, please call your pharmacy*   Lab Work: None.  If you have labs (blood work) drawn today and your tests are completely normal, you will receive your results only by: Marland Kitchen MyChart Message (if you have MyChart) OR . A paper copy in the mail If you have any lab test that is abnormal or we need to change your treatment, we will call you to review the results.   Testing/Procedures: None.    Follow-Up: At [facility], you and your health needs are our priority.  As part of our continuing mission to provide you with exceptional heart care, we have created designated Provider Care Teams.  These Care Teams include your primary Cardiologist (physician) and Advanced Practice Providers (APPs -  Physician Assistants and Nurse Practitioners) who all work together to provide you with the care you need, when you need it.  We recommend signing up for the patient portal called "MyChart".  Sign up information is provided on this After Visit Summary.  MyChart is used to connect with patients for Virtual Visits (Telemedicine).  Patients are able to view lab/test results, encounter notes, upcoming appointments, etc.  Non-urgent messages can be sent to your provider as well.   To learn more about what you can do with MyChart, go to [email or web address].    Your next appointment:   3 month(s)  The format for your next appointment:   In Person  Provider:   You will see Berniece Salines, DO.  Or, you can be scheduled with the following Advanced Practice Provider on your designated Care Team (at our [facility]):  Laurann Montana, FNP     Other Instructions

## 2019-12-07 NOTE — Progress Notes (Signed)
Cardiology Office Note:    Date:  12/07/2019   ID:  Kristen Murphy, DOB 09-09-1966, MRN [id number]  PCP:  Maylon Cos, NP  Cardiologist:  Berniece Salines, DO  Electrophysiologist:  None   Referring MD: Maylon Cos, NP   Chief Complaint  Patient presents with  . Follow-up  follow for CAD, hypertension, Hyperlipidemia  History of Present Illness:    Kristen S Bradyis a 54y.o.femalewith a hx of coronary artery disease status post stent to the LAD in June 2020, hypertension, diabetes mellitus type 2, anxiety,current smokerpresents for follow-up visit.  The patient tells me that since her last visit she has been under work up for cervical cancer - she will see the oncologist soon for likely biopsy. Unfortunately her GYN could not get samples.   In terms of her heart she has been taking her medications and her blood pressure has been staying at target. No other complaints at this time.   Past Medical History:  Diagnosis Date  . Anemia   . Anxiety   . Arthritis   . Asthma   . Chronic kidney disease   . Coronary artery disease   . Depression   . Diabetes mellitus without complication ([place])   . Dyspnea   . Headache   . Heart murmur   . Hypertension   . Seizures ([place])   . Stroke [facility])     Past Surgical History:  Procedure Laterality Date  . CARDIAC CATHETERIZATION    . CORONARY STENT INTERVENTION N/A 02/19/2019   Procedure: CORONARY STENT INTERVENTION;  Surgeon: Lorretta Harp, MD;  Location: [place] [facility];  Service: Cardiovascular;  Laterality: N/A;  . LEFT HEART CATH AND CORONARY ANGIOGRAPHY N/A 02/19/2019   Procedure: LEFT HEART CATH AND CORONARY ANGIOGRAPHY;  Surgeon: Lorretta Harp, MD;  Location: [place] [facility];  Service: Cardiovascular;  Laterality: N/A;  . TONSILLECTOMY      Current Medications: Current Meds  Medication Sig  . albuterol (VENTOLIN HFA) 108 (90 Base) MCG/ACT inhaler Inhale 2 puffs into the lungs every 6 (six) hours as  needed for wheezing or shortness of breath.  Marland Kitchen amLODipine (NORVASC) 10 MG tablet Take 1 tablet (10 mg total) by mouth daily.  Marland Kitchen aspirin 81 MG EC tablet Take 1 tablet (81 mg total) by mouth daily.  Marland Kitchen atorvastatin (LIPITOR) 80 MG tablet Take 1 tablet (80 mg total) by mouth daily at 6 PM.  . dapagliflozin propanediol (FARXIGA) 10 MG TABS tablet Take 10 mg by mouth daily.  Marland Kitchen gabapentin (NEURONTIN) 400 MG capsule Take 400 mg by mouth 3 (three) times daily.  Marland Kitchen lisinopril (ZESTRIL) 40 MG tablet Take 40 mg by mouth daily.  . metFORMIN (GLUCOPHAGE) 1000 MG tablet Take 1 tablet (1,000 mg total) by mouth 2 (two) times daily with a meal.  . metoprolol tartrate (LOPRESSOR) 50 MG tablet Take 1 tablet (50 mg total) by mouth 2 (two) times daily.  . nitroGLYCERIN (NITROSTAT) 0.4 MG SL tablet Place 1 tablet (0.4 mg total) under the tongue every 5 (five) minutes x 3 doses as needed for chest pain.  . ticagrelor (BRILINTA) 90 MG TABS tablet Take 1 tablet (90 mg total) by mouth 2 (two) times daily.     Allergies:   Sulfa antibiotics and Effexor [venlafaxine]   Social History   Socioeconomic History  . Marital status: Married    Spouse name: Not on file  . Number of children: Not on file  . Years of education: Not  on file  . Highest education level: Not on file  Occupational History  . Not on file  Tobacco Use  . Smoking status: Current Every Day Smoker    Packs/day: 3.00    Years: 38.00    Pack years: 114.00    Types: Cigarettes  . Smokeless tobacco: Never Used  Substance and Sexual Activity  . Alcohol use: Not Currently  . Drug use: Not Currently    Types: Cocaine, Benzodiazepines, Amphetamines, "Crack" cocaine, Marijuana, Opium, Methylphenidate, Heroin    Comment: recovering addict  . Sexual activity: Not Currently  Other Topics Concern  . Not on file  Social History Narrative  . Not on file   Social Determinants of Health   Financial Resource Strain:   . Difficulty of Paying Living  Expenses:   Food Insecurity:   . Worried About Charity fundraiser in the Last Year:   . Arboriculturist in the Last Year:   Transportation Needs:   . Film/video editor (Medical):   Marland Kitchen Lack of Transportation (Non-Medical):   Physical Activity:   . Days of Exercise per Week:   . Minutes of Exercise per Session:   Stress:   . Feeling of Stress :   Social Connections:   . Frequency of Communication with Friends and Family:   . Frequency of Social Gatherings with Friends and Family:   . Attends Religious Services:   . Active Member of Clubs or Organizations:   . Attends Archivist Meetings:   Marland Kitchen Marital Status:      Family History: The patient's family history includes Diabetes in her maternal uncle; Hyperlipidemia in her mother; Hypertension in her brother and mother.  ROS:   Review of Systems  Constitution: Negative for decreased appetite, fever and weight gain.  HENT: Negative for congestion, ear discharge, hoarse voice and sore throat.   Eyes: Negative for discharge, redness, vision loss in right eye and visual halos.  Cardiovascular: Negative for chest pain, dyspnea on exertion, leg swelling, orthopnea and palpitations.  Respiratory: Negative for cough, hemoptysis, shortness of breath and snoring.   Endocrine: Negative for heat intolerance and polyphagia.  Hematologic/Lymphatic: Negative for bleeding problem. Does not bruise/bleed easily.  Skin: Negative for flushing, nail changes, rash and suspicious lesions.  Musculoskeletal: Negative for arthritis, joint pain, muscle cramps, myalgias, neck pain and stiffness.  Gastrointestinal: Negative for abdominal pain, bowel incontinence, diarrhea and excessive appetite.  Genitourinary: Negative for decreased libido, genital sores and incomplete emptying.  Neurological: Negative for brief paralysis, focal weakness, headaches and loss of balance.  Psychiatric/Behavioral: Negative for altered mental status, depression and  suicidal ideas.  Allergic/Immunologic: Negative for HIV exposure and persistent infections.    EKGs/Labs/Other Studies Reviewed:    The following studies were reviewed today:   EKG:  None today.  Recent Labs: 02/18/2019: ALT 27 02/20/2019: BUN 16; Creatinine, Ser 0.82; Hemoglobin 13.4; Platelets 213; Potassium 4.4; Sodium 139  Recent Lipid Panel    Component Value Date/Time   CHOL 269 (H) 02/19/2019 0333   TRIG 670 (H) 02/19/2019 0333   HDL 32 (L) 02/19/2019 0333   CHOLHDL 8.4 02/19/2019 0333   VLDL UNABLE TO CALCULATE IF TRIGLYCERIDE OVER 400 mg/dL 02/19/2019 0333   LDLCALC UNABLE TO CALCULATE IF TRIGLYCERIDE OVER 400 mg/dL 02/19/2019 0333   LDLDIRECT 158.3 (H) 02/19/2019 0333    Physical Exam:    VS:  BP 134/78 (BP Location: Left Arm, Patient Position: Sitting, Cuff Size: Large)   Pulse 68  Ht 5\' 2"  (1.575 m)   Wt 199 lb (90.3 kg)   SpO2 91%   BMI 36.40 kg/m     Wt Readings from Last 3 Encounters:  12/07/19 199 lb (90.3 kg)  10/07/19 195 lb 12.8 oz (88.8 kg)  06/17/19 199 lb 3.2 oz (90.4 kg)     GEN: Well nourished, well developed in no acute distress HEENT: Normal NECK: No JVD; No carotid bruits LYMPHATICS: No lymphadenopathy CARDIAC: S1S2 noted,RRR, no murmurs, rubs, gallops RESPIRATORY:  Clear to auscultation without rales, wheezing or rhonchi  ABDOMEN: Soft, non-tender, non-distended, +bowel sounds, no guarding. EXTREMITIES: No edema, No cyanosis, no clubbing MUSCULOSKELETAL:  No deformity  SKIN: Warm and dry NEUROLOGIC:  Alert and oriented x 3, non-focal PSYCHIATRIC:  Normal affect, good insight  ASSESSMENT:    1. Essential hypertension   2. Cerebrovascular disease   3. CAD in native artery   4. Type 2 diabetes mellitus with diabetic peripheral angiopathy without gangrene, without long-term current use of insulin ([place])   5. Tobacco use disorder   6. Dyslipidemia (high LDL; low HDL)    PLAN:     1.  Her blood pressure is acceptable. At this time  no changes in her medication needs to be made.   2. CAD- stable no signs of angina. Continue on DAPT with Aspirin and Brillinta, lipitor 80mg  daily  3. Dyslipidemia -continue lipitor. Needs repeat lipid profile  4. Concern for cervical cancer - per gyn and oncology   5. Smoking cessation advised.    The patient is in agreement with the above plan. The patient left the office in stable condition.  The patient will follow up in 3 months ( as she prefer to see me sooner than 6 months as I would rather schedule) incase any procedures is being planned.   Medication Adjustments/Labs and Tests Ordered: Current medicines are reviewed at length with the patient today.  Concerns regarding medicines are outlined above.  No orders of the defined types were placed in this encounter.  No orders of the defined types were placed in this encounter.   Patient Instructions  Medication Instructions:  Your physician recommends that you continue on your current medications as directed. Please refer to the Current Medication list given to you today.  *If you need a refill on your cardiac medications before your next appointment, please call your pharmacy*   Lab Work: None.  If you have labs (blood work) drawn today and your tests are completely normal, you will receive your results only by: Marland Kitchen MyChart Message (if you have MyChart) OR . A paper copy in the mail If you have any lab test that is abnormal or we need to change your treatment, we will call you to review the results.   Testing/Procedures: None.    Follow-Up: At [facility], you and your health needs are our priority.  As part of our continuing mission to provide you with exceptional heart care, we have created designated Provider Care Teams.  These Care Teams include your primary Cardiologist (physician) and Advanced Practice Providers (APPs -  Physician Assistants and Nurse Practitioners) who all work together to provide you with  the care you need, when you need it.  We recommend signing up for the patient portal called "MyChart".  Sign up information is provided on this After Visit Summary.  MyChart is used to connect with patients for Virtual Visits (Telemedicine).  Patients are able to view lab/test results, encounter notes, upcoming appointments, etc.  Non-urgent messages can be sent to your provider as well.   To learn more about what you can do with MyChart, go to [email or web address].    Your next appointment:   3 month(s)  The format for your next appointment:   In Person  Provider:   You will see Berniece Salines, DO.  Or, you can be scheduled with the following Advanced Practice Provider on your designated Care Team (at our [facility]):  Laurann Montana, FNP     Other Instructions      Adopting a Healthy Lifestyle.  Know what a healthy weight is for you (roughly BMI <25) and aim to maintain this   Aim for 7+ servings of fruits and vegetables daily   65-80+ fluid ounces of water or unsweet tea for healthy kidneys   Limit to max 1 drink of alcohol per day; avoid smoking/tobacco   Limit animal fats in diet for cholesterol and heart health - choose grass fed whenever available   Avoid highly processed foods, and foods high in saturated/trans fats   Aim for low stress - take time to unwind and care for your mental health   Aim for 150 min of moderate intensity exercise weekly for heart health, and weights twice weekly for bone health   Aim for 7-9 hours of sleep daily   When it comes to diets, agreement about the perfect plan isnt easy to find, even among the experts. Experts at the [place] developed an idea known as the Healthy Eating Plate. Just imagine a plate divided into logical, healthy portions.   The emphasis is on diet quality:   Load up on vegetables and fruits - one-half of your plate: Aim for color and variety, and remember that potatoes dont  count.   Go for whole grains - one-quarter of your plate: Whole wheat, barley, wheat berries, quinoa, oats, brown rice, and foods made with them. If you want pasta, go with whole wheat pasta.   Protein power - one-quarter of your plate: Fish, chicken, beans, and nuts are all healthy, versatile protein sources. Limit red meat.   The diet, however, does go beyond the plate, offering a few other suggestions.   Use healthy plant oils, such as olive, canola, soy, corn, sunflower and peanut. Check the labels, and avoid partially hydrogenated oil, which have unhealthy trans fats.   If youre thirsty, drink water. Coffee and tea are good in moderation, but skip sugary drinks and limit milk and dairy products to one or two daily servings.   The type of carbohydrate in the diet is more important than the amount. Some sources of carbohydrates, such as vegetables, fruits, whole grains, and beans-are healthier than others.   Finally, stay active  Signed, Berniece Salines, DO  12/07/2019 11:26 AM    [place]

## 2020-01-18 ENCOUNTER — Telehealth: Payer: Self-pay | Admitting: Cardiology

## 2020-01-18 NOTE — Telephone Encounter (Signed)
Kristen Murphy, please let the patient know that I will call her before the end of the day.  Thank you

## 2020-01-18 NOTE — Telephone Encounter (Signed)
I was able to speak with the patient- she wanted to notify me that she has been diagnosed with stage 3 cervical cancer, uterine cancer and pelvic cancer.  She tells me that she was told that she is not a surgical candidate as she may have some metastasis but the plan is chemotherapy and radiation. For now nothing for me to do from a cardiovascular standpoint this call was just for her to notify me of her new diagnosis.

## 2020-01-18 NOTE — Telephone Encounter (Signed)
Pt is aware that Dr. Harriet Masson will call her at the end of the day.

## 2020-01-18 NOTE — Telephone Encounter (Signed)
Patient was calling to make Dr. Harriet Masson aware that she was recently diagnosed with cervical, uterine and pelvic cancer.  She would like to speak to Dr. Harriet Masson.

## 2020-01-26 DIAGNOSIS — C539 Malignant neoplasm of cervix uteri, unspecified: Secondary | ICD-10-CM | POA: Insufficient documentation

## 2020-03-01 ENCOUNTER — Other Ambulatory Visit: Payer: Self-pay

## 2020-03-04 ENCOUNTER — Ambulatory Visit: Payer: Medicaid Other | Admitting: Cardiology

## 2020-03-07 ENCOUNTER — Ambulatory Visit: Payer: Medicaid Other | Admitting: Cardiology

## 2020-03-29 ENCOUNTER — Ambulatory Visit: Payer: Medicaid Other | Admitting: Cardiology

## 2020-04-12 ENCOUNTER — Other Ambulatory Visit: Payer: Self-pay | Admitting: Cardiology

## 2020-04-12 NOTE — Telephone Encounter (Signed)
Rx refill sent to pharmacy. 

## 2020-11-25 ENCOUNTER — Telehealth: Payer: Self-pay

## 2020-11-25 NOTE — Telephone Encounter (Signed)
Left a message for patient to call us to set up and appointment with Dr. Harriet Masson. Patient needs surgery clearance.

## 2020-12-23 DIAGNOSIS — K626 Ulcer of anus and rectum: Secondary | ICD-10-CM

## 2020-12-23 HISTORY — PX: COLONOSCOPY: SHX174

## 2020-12-23 HISTORY — DX: Ulcer of anus and rectum: K62.6

## 2020-12-23 HISTORY — PX: ESOPHAGOGASTRODUODENOSCOPY: SHX1529

## 2020-12-26 DIAGNOSIS — I639 Cerebral infarction, unspecified: Secondary | ICD-10-CM | POA: Insufficient documentation

## 2020-12-26 DIAGNOSIS — M1990 Unspecified osteoarthritis, unspecified site: Secondary | ICD-10-CM | POA: Insufficient documentation

## 2020-12-26 DIAGNOSIS — R569 Unspecified convulsions: Secondary | ICD-10-CM | POA: Insufficient documentation

## 2020-12-26 DIAGNOSIS — I2510 Atherosclerotic heart disease of native coronary artery without angina pectoris: Secondary | ICD-10-CM | POA: Insufficient documentation

## 2020-12-26 DIAGNOSIS — N189 Chronic kidney disease, unspecified: Secondary | ICD-10-CM | POA: Insufficient documentation

## 2020-12-26 DIAGNOSIS — F419 Anxiety disorder, unspecified: Secondary | ICD-10-CM | POA: Insufficient documentation

## 2020-12-26 DIAGNOSIS — I10 Essential (primary) hypertension: Secondary | ICD-10-CM | POA: Insufficient documentation

## 2020-12-26 DIAGNOSIS — R0600 Dyspnea, unspecified: Secondary | ICD-10-CM | POA: Insufficient documentation

## 2020-12-26 DIAGNOSIS — R011 Cardiac murmur, unspecified: Secondary | ICD-10-CM | POA: Insufficient documentation

## 2020-12-26 DIAGNOSIS — F32A Depression, unspecified: Secondary | ICD-10-CM | POA: Insufficient documentation

## 2020-12-26 DIAGNOSIS — R519 Headache, unspecified: Secondary | ICD-10-CM | POA: Insufficient documentation

## 2020-12-26 DIAGNOSIS — J45909 Unspecified asthma, uncomplicated: Secondary | ICD-10-CM | POA: Insufficient documentation

## 2020-12-26 DIAGNOSIS — D649 Anemia, unspecified: Secondary | ICD-10-CM | POA: Insufficient documentation

## 2020-12-26 DIAGNOSIS — E119 Type 2 diabetes mellitus without complications: Secondary | ICD-10-CM | POA: Insufficient documentation

## 2020-12-28 ENCOUNTER — Ambulatory Visit: Payer: Medicaid Other | Admitting: Cardiology

## 2021-01-04 ENCOUNTER — Ambulatory Visit: Payer: Medicaid Other | Admitting: Cardiology

## 2021-01-06 ENCOUNTER — Ambulatory Visit (INDEPENDENT_AMBULATORY_CARE_PROVIDER_SITE_OTHER): Payer: Medicaid Other | Admitting: Cardiology

## 2021-01-06 ENCOUNTER — Encounter: Payer: Self-pay | Admitting: Cardiology

## 2021-01-06 ENCOUNTER — Other Ambulatory Visit: Payer: Self-pay

## 2021-01-06 ENCOUNTER — Telehealth: Payer: Self-pay

## 2021-01-06 VITALS — BP 146/84 | HR 70 | Ht 62.0 in | Wt 180.8 lb

## 2021-01-06 DIAGNOSIS — I10 Essential (primary) hypertension: Secondary | ICD-10-CM | POA: Diagnosis not present

## 2021-01-06 DIAGNOSIS — I2510 Atherosclerotic heart disease of native coronary artery without angina pectoris: Secondary | ICD-10-CM

## 2021-01-06 DIAGNOSIS — E785 Hyperlipidemia, unspecified: Secondary | ICD-10-CM

## 2021-01-06 DIAGNOSIS — E669 Obesity, unspecified: Secondary | ICD-10-CM

## 2021-01-06 NOTE — Patient Instructions (Signed)
Medication Instructions:  Your physician has recommended you make the following change in your medication:  STOP: Brilinta *If you need a refill on your cardiac medications before your next appointment, please call your pharmacy*   Lab Work: None If you have labs (blood work) drawn today and your tests are completely normal, you will receive your results only by: Marland Kitchen MyChart Message (if you have MyChart) OR . A paper copy in the mail If you have any lab test that is abnormal or we need to change your treatment, we will call you to review the results.   Testing/Procedures: Non   Follow-Up: At [facility], you and your health needs are our priority.  As part of our continuing mission to provide you with exceptional heart care, we have created designated Provider Care Teams.  These Care Teams include your primary Cardiologist (physician) and Advanced Practice Providers (APPs -  Physician Assistants and Nurse Practitioners) who all work together to provide you with the care you need, when you need it.  We recommend signing up for the patient portal called "MyChart".  Sign up information is provided on this After Visit Summary.  MyChart is used to connect with patients for Virtual Visits (Telemedicine).  Patients are able to view lab/test results, encounter notes, upcoming appointments, etc.  Non-urgent messages can be sent to your provider as well.   To learn more about what you can do with MyChart, go to [email or web address].    Your next appointment:   1 year(s)  The format for your next appointment:   In Person  Provider:   Berniece Salines, DO   Other Instructions

## 2021-01-06 NOTE — Telephone Encounter (Signed)
Faxed paperwork for Brilinta clearance.

## 2021-01-06 NOTE — Progress Notes (Signed)
Cardiology Office Note:    Date:  01/06/2021   ID:  Kristen Murphy, DOB 1966-09-16, MRN [id number]  PCP:  Gweneth Fritter, FNP  Cardiologist:  Berniece Salines, DO  Electrophysiologist:  None   Referring MD: Maylon Cos, NP   I am doing a lot better  History of Present Illness:    Kristen Murphy is a 55 y.o. female with a hx of coronary artery disease status post stent to the LAD in May 2020, hypertension, diabetes mellitus type 2, anxiety,current smokerpresents for follow-up visit.     I last saw the patient in March 2021 at that time she appeared to be doing well from a cardiovascular standpoint.  She has an upcoming procedure for her cervical cancer as well as endoscopy.  She has no complaints at this time.  She denies any chest pain, shortness of breath, lightheadedness or dizziness.  Past Medical History:  Diagnosis Date  . Anemia   . Anxiety   . Arthritis   . Asthma   . Chronic kidney disease   . Coronary artery disease   . Depression   . Diabetes mellitus without complication ([place])   . Dyspnea   . Headache   . Heart murmur   . Hypertension   . Seizures ([place])   . Stroke [facility])     Past Surgical History:  Procedure Laterality Date  . CARDIAC CATHETERIZATION    . CORONARY STENT INTERVENTION N/A 02/19/2019   Procedure: CORONARY STENT INTERVENTION;  Surgeon: Lorretta Harp, MD;  Location: [place] [facility];  Service: Cardiovascular;  Laterality: N/A;  . LEFT HEART CATH AND CORONARY ANGIOGRAPHY N/A 02/19/2019   Procedure: LEFT HEART CATH AND CORONARY ANGIOGRAPHY;  Surgeon: Lorretta Harp, MD;  Location: [place] [facility];  Service: Cardiovascular;  Laterality: N/A;  . TONSILLECTOMY      Current Medications: Current Meds  Medication Sig  . albuterol (VENTOLIN HFA) 108 (90 Base) MCG/ACT inhaler Inhale 2 puffs into the lungs every 6 (six) hours as needed for wheezing or shortness of breath.  Marland Kitchen amLODipine (NORVASC) 10 MG tablet TAKE 1 TABLET BY MOUTH ONCE  DAILY  . aspirin 81 MG EC tablet Take 1 tablet (81 mg total) by mouth daily.  Marland Kitchen atorvastatin (LIPITOR) 80 MG tablet Take 1 tablet (80 mg total) by mouth daily at 6 PM.  . dapagliflozin propanediol (FARXIGA) 10 MG TABS tablet Take 10 mg by mouth daily.  Marland Kitchen gabapentin (NEURONTIN) 400 MG capsule Take 400 mg by mouth 3 (three) times daily.  Marland Kitchen ibuprofen (ADVIL) 800 MG tablet Take 1 tablet by mouth every 6 (six) hours as needed for fever, headache, mild pain, moderate pain or cramping.  Marland Kitchen lisinopril (ZESTRIL) 40 MG tablet Take 40 mg by mouth daily.  . metFORMIN (GLUCOPHAGE) 1000 MG tablet Take 1 tablet (1,000 mg total) by mouth 2 (two) times daily with a meal.  . metoprolol tartrate (LOPRESSOR) 50 MG tablet Take 1 tablet (50 mg total) by mouth 2 (two) times daily.  . nitroGLYCERIN (NITROSTAT) 0.4 MG SL tablet Place 1 tablet (0.4 mg total) under the tongue every 5 (five) minutes x 3 doses as needed for chest pain.  Marland Kitchen ondansetron (ZOFRAN-ODT) 4 MG disintegrating tablet Take 1 tablet by mouth every 8 (eight) hours as needed for nausea, vomiting or refractory nausea / vomiting.  . [DISCONTINUED] Melatonin 3 MG CAPS Take 9 mg by mouth as needed (sleep).  . [DISCONTINUED] metoprolol succinate (TOPROL-XL) 50 MG 24 hr tablet  Take 1 tablet by mouth in the morning and at bedtime.  . [DISCONTINUED] ticagrelor (BRILINTA) 90 MG TABS tablet Take 1 tablet (90 mg total) by mouth 2 (two) times daily.     Allergies:   Glimepiride, Sulfa antibiotics, and Effexor [venlafaxine]   Social History   Socioeconomic History  . Marital status: Married    Spouse name: Not on file  . Number of children: Not on file  . Years of education: Not on file  . Highest education level: Not on file  Occupational History  . Not on file  Tobacco Use  . Smoking status: Current Every Day Smoker    Packs/day: 3.00    Years: 38.00    Pack years: 114.00    Types: Cigarettes  . Smokeless tobacco: Never Used  Vaping Use  . Vaping Use:  Some days  Substance and Sexual Activity  . Alcohol use: Not Currently  . Drug use: Not Currently    Types: Cocaine, Benzodiazepines, Amphetamines, "Crack" cocaine, Marijuana, Opium, Methylphenidate, Heroin    Comment: recovering addict  . Sexual activity: Not Currently  Other Topics Concern  . Not on file  Social History Narrative  . Not on file   Social Determinants of Health   Financial Resource Strain: Not on file  Food Insecurity: Not on file  Transportation Needs: Not on file  Physical Activity: Not on file  Stress: Not on file  Social Connections: Not on file     Family History: The patient's family history includes Diabetes in her maternal uncle; Hyperlipidemia in her mother; Hypertension in her brother and mother.  ROS:   Review of Systems  Constitution: Negative for decreased appetite, fever and weight gain.  HENT: Negative for congestion, ear discharge, hoarse voice and sore throat.   Eyes: Negative for discharge, redness, vision loss in right eye and visual halos.  Cardiovascular: Negative for chest pain, dyspnea on exertion, leg swelling, orthopnea and palpitations.  Respiratory: Negative for cough, hemoptysis, shortness of breath and snoring.   Endocrine: Negative for heat intolerance and polyphagia.  Hematologic/Lymphatic: Negative for bleeding problem. Does not bruise/bleed easily.  Skin: Negative for flushing, nail changes, rash and suspicious lesions.  Musculoskeletal: Negative for arthritis, joint pain, muscle cramps, myalgias, neck pain and stiffness.  Gastrointestinal: Negative for abdominal pain, bowel incontinence, diarrhea and excessive appetite.  Genitourinary: Negative for decreased libido, genital sores and incomplete emptying.  Neurological: Negative for brief paralysis, focal weakness, headaches and loss of balance.  Psychiatric/Behavioral: Negative for altered mental status, depression and suicidal ideas.  Allergic/Immunologic: Negative for HIV  exposure and persistent infections.    EKGs/Labs/Other Studies Reviewed:    The following studies were reviewed today:   EKG:  The ekg ordered today demonstrates sinus rhythm, heart rate 70 bpm, poor R wave progression suggesting old septal infarction.  Compared to EKG done in September 2020 no significant change  Transthoracic echocardiogram Feb 19, 2019 IMPRESSIONS   1. The left ventricle has hyperdynamic systolic function, with an  ejection fraction of >65%. The cavity size was normal. Left ventricular  diastolic Doppler parameters are consistent with impaired relaxation. No  evidence of left ventricular regional wall  motion abnormalities.  2. The right ventricle has normal systolic function. The cavity was  normal. There is no increase in right ventricular wall thickness.  3. The aortic root is normal in size and structure.   FINDINGS  Left Ventricle: The left ventricle has hyperdynamic systolic function,  with an ejection fraction of >65%. The  cavity size was normal. There is no  increase in left ventricular wall thickness. Left ventricular diastolic  Doppler parameters are consistent  with impaired relaxation. No evidence of left ventricular regional wall  motion abnormalities..   Right Ventricle: The right ventricle has normal systolic function. The  cavity was normal. There is no increase in right ventricular wall  thickness.   Left Atrium: Left atrial size was normal in size.   Right Atrium: Right atrial size was normal in size. Right atrial pressure  is estimated at 3 mmHg.   Interatrial Septum: No atrial level shunt detected by color flow Doppler.   Pericardium: There is no evidence of pericardial effusion.   Mitral Valve: The mitral valve is normal in structure. Mitral valve  regurgitation is not visualized by color flow Doppler.   Tricuspid Valve: The tricuspid valve is normal in structure. Tricuspid  valve regurgitation was not visualized by color  flow Doppler.   Aortic Valve: The aortic valve is normal in structure. Aortic valve  regurgitation was not visualized by color flow Doppler.   Pulmonic Valve: The pulmonic valve was not assessed. Pulmonic valve  regurgitation is not visualized by color flow Doppler.   Aorta: The aortic root is normal in size and structure.   Venous: The inferior vena cava is normal in size with greater than 50%  respiratory variability.   Recent Labs: No results found for requested labs within last 8760 hours.  Recent Lipid Panel    Component Value Date/Time   CHOL 269 (H) 02/19/2019 0333   TRIG 670 (H) 02/19/2019 0333   HDL 32 (L) 02/19/2019 0333   CHOLHDL 8.4 02/19/2019 0333   VLDL UNABLE TO CALCULATE IF TRIGLYCERIDE OVER 400 mg/dL 02/19/2019 0333   LDLCALC UNABLE TO CALCULATE IF TRIGLYCERIDE OVER 400 mg/dL 02/19/2019 0333   LDLDIRECT 158.3 (H) 02/19/2019 0333    Physical Exam:    VS:  BP (!) 146/84   Pulse 70   Ht 5\' 2"  (1.575 m)   Wt 180 lb 12.8 oz (82 kg)   SpO2 98%   BMI 33.07 kg/m     Wt Readings from Last 3 Encounters:  01/06/21 180 lb 12.8 oz (82 kg)  12/07/19 199 lb (90.3 kg)  10/07/19 195 lb 12.8 oz (88.8 kg)     GEN: Well nourished, well developed in no acute distress HEENT: Normal NECK: No JVD; No carotid bruits LYMPHATICS: No lymphadenopathy CARDIAC: S1S2 noted,RRR, 2/6 systolic murmurs, rubs, gallops RESPIRATORY:  Clear to auscultation without rales, wheezing or rhonchi  ABDOMEN: Soft, non-tender, non-distended, +bowel sounds, no guarding. EXTREMITIES: No edema, No cyanosis, no clubbing MUSCULOSKELETAL:  No deformity  SKIN: Warm and dry NEUROLOGIC:  Alert and oriented x 3, non-focal PSYCHIATRIC:  Normal affect, good insight  ASSESSMENT:    1. Coronary artery disease involving native coronary artery of native heart, unspecified whether angina present   2. Hypertension, unspecified type   3. Hyperlipidemia, unspecified hyperlipidemia type   4. Obesity (BMI  30-39.9)    PLAN:     She appeared to be stable from a cardiovascular standpoint.  She had been on dual antiplatelet therapy over a year now.  We will stop the Brilinta at this time.  She will remain on her aspirin as well as her statin.  Like to get blood work today for lipid profile however the patient tells me she has scheduled blood work coming up with her PCP office.  I am going to request these results for my review.  The patient does not have any unstable cardiac conditions.  Upon evaluation today, she can achieve 4 METs or greater without anginal symptoms.  According to [facility] and AHA guidelines, she requires no further cardiac workup prior to her noncardiac surgery and should be at acceptable risk.  Our service is available as necessary in the perioperative period.  This is being managed by his primary care doctor.  No adjustments for antidiabetic medications were made today.   Hyperlipidemia - continue with current statin medication.  The patient is in agreement with the above plan. The patient left the office in stable condition.  The patient will follow up in 1 year or sooner if needed.   Medication Adjustments/Labs and Tests Ordered: Current medicines are reviewed at length with the patient today.  Concerns regarding medicines are outlined above.  Orders Placed This Encounter  Procedures  . EKG 12-Lead   No orders of the defined types were placed in this encounter.   Patient Instructions  Medication Instructions:  Your physician has recommended you make the following change in your medication:  STOP: Brilinta *If you need a refill on your cardiac medications before your next appointment, please call your pharmacy*   Lab Work: None If you have labs (blood work) drawn today and your tests are completely normal, you will receive your results only by: Marland Kitchen MyChart Message (if you have MyChart) OR . A paper copy in the mail If you have any lab test that is abnormal or we  need to change your treatment, we will call you to review the results.   Testing/Procedures: Non   Follow-Up: At [facility], you and your health needs are our priority.  As part of our continuing mission to provide you with exceptional heart care, we have created designated Provider Care Teams.  These Care Teams include your primary Cardiologist (physician) and Advanced Practice Providers (APPs -  Physician Assistants and Nurse Practitioners) who all work together to provide you with the care you need, when you need it.  We recommend signing up for the patient portal called "MyChart".  Sign up information is provided on this After Visit Summary.  MyChart is used to connect with patients for Virtual Visits (Telemedicine).  Patients are able to view lab/test results, encounter notes, upcoming appointments, etc.  Non-urgent messages can be sent to your provider as well.   To learn more about what you can do with MyChart, go to [email or web address].    Your next appointment:   1 year(s)  The format for your next appointment:   In Person  Provider:   Berniece Salines, DO   Other Instructions      Adopting a Healthy Lifestyle.  Know what a healthy weight is for you (roughly BMI <25) and aim to maintain this   Aim for 7+ servings of fruits and vegetables daily   65-80+ fluid ounces of water or unsweet tea for healthy kidneys   Limit to max 1 drink of alcohol per day; avoid smoking/tobacco   Limit animal fats in diet for cholesterol and heart health - choose grass fed whenever available   Avoid highly processed foods, and foods high in saturated/trans fats   Aim for low stress - take time to unwind and care for your mental health   Aim for 150 min of moderate intensity exercise weekly for heart health, and weights twice weekly for bone health   Aim for 7-9 hours of sleep daily   When it comes to diets, agreement  about the perfect plan isnt easy to find, even among the  experts. Experts at the [employer] developed an idea known as the Healthy Eating Plate. Just imagine a plate divided into logical, healthy portions.   The emphasis is on diet quality:   Load up on vegetables and fruits - one-half of your plate: Aim for color and variety, and remember that potatoes dont count.   Go for whole grains - one-quarter of your plate: Whole wheat, barley, wheat berries, quinoa, oats, brown rice, and foods made with them. If you want pasta, go with whole wheat pasta.   Protein power - one-quarter of your plate: Fish, chicken, beans, and nuts are all healthy, versatile protein sources. Limit red meat.   The diet, however, does go beyond the plate, offering a few other suggestions.   Use healthy plant oils, such as olive, canola, soy, corn, sunflower and peanut. Check the labels, and avoid partially hydrogenated oil, which have unhealthy trans fats.   If youre thirsty, drink water. Coffee and tea are good in moderation, but skip sugary drinks and limit milk and dairy products to one or two daily servings.   The type of carbohydrate in the diet is more important than the amount. Some sources of carbohydrates, such as vegetables, fruits, whole grains, and beans-are healthier than others.   Finally, stay active  Signed, Berniece Salines, DO  01/06/2021 9:49 AM    [place]

## 2021-08-24 HISTORY — PX: NEPHRECTOMY: SHX65

## 2021-08-24 HISTORY — PX: COLOSTOMY: SHX63

## 2021-11-12 ENCOUNTER — Encounter (HOSPITAL_COMMUNITY)
Admission: EM | Disposition: A | Discharge status: Hospice | Payer: Self-pay | Source: Home / Self Care | Attending: Internal Medicine

## 2021-11-12 ENCOUNTER — Encounter (HOSPITAL_COMMUNITY): Payer: Self-pay

## 2021-11-12 ENCOUNTER — Emergency Department (HOSPITAL_COMMUNITY): Payer: Medicaid Other

## 2021-11-12 ENCOUNTER — Inpatient Hospital Stay (HOSPITAL_COMMUNITY): Payer: Medicaid Other

## 2021-11-12 ENCOUNTER — Inpatient Hospital Stay (HOSPITAL_COMMUNITY)
Admission: EM | Admit: 2021-11-12 | Discharge: 2021-12-10 | DRG: 004 | Disposition: A | Discharge status: Hospice | Payer: Medicaid Other | Attending: Internal Medicine | Admitting: Internal Medicine

## 2021-11-12 ENCOUNTER — Other Ambulatory Visit: Payer: Self-pay

## 2021-11-12 DIAGNOSIS — R6521 Severe sepsis with septic shock: Secondary | ICD-10-CM | POA: Diagnosis not present

## 2021-11-12 DIAGNOSIS — I6522 Occlusion and stenosis of left carotid artery: Secondary | ICD-10-CM | POA: Diagnosis present

## 2021-11-12 DIAGNOSIS — Z6830 Body mass index (BMI) 30.0-30.9, adult: Secondary | ICD-10-CM

## 2021-11-12 DIAGNOSIS — M1990 Unspecified osteoarthritis, unspecified site: Secondary | ICD-10-CM | POA: Diagnosis present

## 2021-11-12 DIAGNOSIS — G936 Cerebral edema: Secondary | ICD-10-CM | POA: Diagnosis present

## 2021-11-12 DIAGNOSIS — Z515 Encounter for palliative care: Secondary | ICD-10-CM

## 2021-11-12 DIAGNOSIS — Z905 Acquired absence of kidney: Secondary | ICD-10-CM

## 2021-11-12 DIAGNOSIS — Z8673 Personal history of transient ischemic attack (TIA), and cerebral infarction without residual deficits: Secondary | ICD-10-CM

## 2021-11-12 DIAGNOSIS — Z882 Allergy status to sulfonamides status: Secondary | ICD-10-CM

## 2021-11-12 DIAGNOSIS — E8809 Other disorders of plasma-protein metabolism, not elsewhere classified: Secondary | ICD-10-CM | POA: Diagnosis present

## 2021-11-12 DIAGNOSIS — Z20822 Contact with and (suspected) exposure to covid-19: Secondary | ICD-10-CM | POA: Diagnosis present

## 2021-11-12 DIAGNOSIS — I76 Septic arterial embolism: Secondary | ICD-10-CM | POA: Diagnosis not present

## 2021-11-12 DIAGNOSIS — E1165 Type 2 diabetes mellitus with hyperglycemia: Secondary | ICD-10-CM | POA: Diagnosis present

## 2021-11-12 DIAGNOSIS — G40909 Epilepsy, unspecified, not intractable, without status epilepticus: Secondary | ICD-10-CM | POA: Diagnosis present

## 2021-11-12 DIAGNOSIS — B9562 Methicillin resistant Staphylococcus aureus infection as the cause of diseases classified elsewhere: Secondary | ICD-10-CM | POA: Diagnosis present

## 2021-11-12 DIAGNOSIS — C539 Malignant neoplasm of cervix uteri, unspecified: Secondary | ICD-10-CM | POA: Diagnosis not present

## 2021-11-12 DIAGNOSIS — Z833 Family history of diabetes mellitus: Secondary | ICD-10-CM

## 2021-11-12 DIAGNOSIS — Z66 Do not resuscitate: Secondary | ICD-10-CM | POA: Diagnosis not present

## 2021-11-12 DIAGNOSIS — T82868A Thrombosis of vascular prosthetic devices, implants and grafts, initial encounter: Secondary | ICD-10-CM | POA: Diagnosis not present

## 2021-11-12 DIAGNOSIS — Z7984 Long term (current) use of oral hypoglycemic drugs: Secondary | ICD-10-CM

## 2021-11-12 DIAGNOSIS — Z930 Tracheostomy status: Secondary | ICD-10-CM | POA: Diagnosis not present

## 2021-11-12 DIAGNOSIS — D509 Iron deficiency anemia, unspecified: Secondary | ICD-10-CM | POA: Diagnosis present

## 2021-11-12 DIAGNOSIS — G0490 Encephalitis and encephalomyelitis, unspecified: Secondary | ICD-10-CM | POA: Diagnosis present

## 2021-11-12 DIAGNOSIS — E1122 Type 2 diabetes mellitus with diabetic chronic kidney disease: Secondary | ICD-10-CM | POA: Diagnosis present

## 2021-11-12 DIAGNOSIS — A4102 Sepsis due to Methicillin resistant Staphylococcus aureus: Principal | ICD-10-CM | POA: Diagnosis present

## 2021-11-12 DIAGNOSIS — I82621 Acute embolism and thrombosis of deep veins of right upper extremity: Secondary | ICD-10-CM | POA: Diagnosis not present

## 2021-11-12 DIAGNOSIS — G9341 Metabolic encephalopathy: Secondary | ICD-10-CM | POA: Diagnosis present

## 2021-11-12 DIAGNOSIS — N390 Urinary tract infection, site not specified: Secondary | ICD-10-CM | POA: Diagnosis present

## 2021-11-12 DIAGNOSIS — I82612 Acute embolism and thrombosis of superficial veins of left upper extremity: Secondary | ICD-10-CM | POA: Diagnosis not present

## 2021-11-12 DIAGNOSIS — Z452 Encounter for adjustment and management of vascular access device: Secondary | ICD-10-CM | POA: Diagnosis not present

## 2021-11-12 DIAGNOSIS — R0600 Dyspnea, unspecified: Secondary | ICD-10-CM | POA: Diagnosis not present

## 2021-11-12 DIAGNOSIS — I469 Cardiac arrest, cause unspecified: Secondary | ICD-10-CM | POA: Diagnosis not present

## 2021-11-12 DIAGNOSIS — I480 Paroxysmal atrial fibrillation: Secondary | ICD-10-CM | POA: Diagnosis not present

## 2021-11-12 DIAGNOSIS — Z7189 Other specified counseling: Secondary | ICD-10-CM | POA: Diagnosis not present

## 2021-11-12 DIAGNOSIS — B9689 Other specified bacterial agents as the cause of diseases classified elsewhere: Secondary | ICD-10-CM | POA: Diagnosis present

## 2021-11-12 DIAGNOSIS — R569 Unspecified convulsions: Secondary | ICD-10-CM | POA: Diagnosis not present

## 2021-11-12 DIAGNOSIS — K920 Hematemesis: Secondary | ICD-10-CM | POA: Diagnosis not present

## 2021-11-12 DIAGNOSIS — G253 Myoclonus: Secondary | ICD-10-CM | POA: Diagnosis present

## 2021-11-12 DIAGNOSIS — E119 Type 2 diabetes mellitus without complications: Secondary | ICD-10-CM

## 2021-11-12 DIAGNOSIS — Z4659 Encounter for fitting and adjustment of other gastrointestinal appliance and device: Secondary | ICD-10-CM

## 2021-11-12 DIAGNOSIS — Z933 Colostomy status: Secondary | ICD-10-CM

## 2021-11-12 DIAGNOSIS — E876 Hypokalemia: Secondary | ICD-10-CM | POA: Diagnosis present

## 2021-11-12 DIAGNOSIS — J45909 Unspecified asthma, uncomplicated: Secondary | ICD-10-CM | POA: Diagnosis present

## 2021-11-12 DIAGNOSIS — I129 Hypertensive chronic kidney disease with stage 1 through stage 4 chronic kidney disease, or unspecified chronic kidney disease: Secondary | ICD-10-CM | POA: Diagnosis present

## 2021-11-12 DIAGNOSIS — E11649 Type 2 diabetes mellitus with hypoglycemia without coma: Secondary | ICD-10-CM | POA: Diagnosis not present

## 2021-11-12 DIAGNOSIS — G003 Staphylococcal meningitis: Secondary | ICD-10-CM | POA: Diagnosis present

## 2021-11-12 DIAGNOSIS — I2510 Atherosclerotic heart disease of native coronary artery without angina pectoris: Secondary | ICD-10-CM | POA: Diagnosis present

## 2021-11-12 DIAGNOSIS — Z888 Allergy status to other drugs, medicaments and biological substances status: Secondary | ICD-10-CM

## 2021-11-12 DIAGNOSIS — Z9911 Dependence on respirator [ventilator] status: Secondary | ICD-10-CM

## 2021-11-12 DIAGNOSIS — J850 Gangrene and necrosis of lung: Secondary | ICD-10-CM | POA: Diagnosis present

## 2021-11-12 DIAGNOSIS — Z955 Presence of coronary angioplasty implant and graft: Secondary | ICD-10-CM

## 2021-11-12 DIAGNOSIS — J9690 Respiratory failure, unspecified, unspecified whether with hypoxia or hypercapnia: Secondary | ICD-10-CM | POA: Diagnosis not present

## 2021-11-12 DIAGNOSIS — K922 Gastrointestinal hemorrhage, unspecified: Secondary | ICD-10-CM

## 2021-11-12 DIAGNOSIS — Z79899 Other long term (current) drug therapy: Secondary | ICD-10-CM

## 2021-11-12 DIAGNOSIS — J690 Pneumonitis due to inhalation of food and vomit: Secondary | ICD-10-CM | POA: Diagnosis present

## 2021-11-12 DIAGNOSIS — Z789 Other specified health status: Secondary | ICD-10-CM | POA: Diagnosis not present

## 2021-11-12 DIAGNOSIS — Z23 Encounter for immunization: Secondary | ICD-10-CM

## 2021-11-12 DIAGNOSIS — S01512A Laceration without foreign body of oral cavity, initial encounter: Secondary | ICD-10-CM | POA: Diagnosis not present

## 2021-11-12 DIAGNOSIS — Z7982 Long term (current) use of aspirin: Secondary | ICD-10-CM

## 2021-11-12 DIAGNOSIS — R0902 Hypoxemia: Secondary | ICD-10-CM | POA: Diagnosis not present

## 2021-11-12 DIAGNOSIS — G9340 Encephalopathy, unspecified: Secondary | ICD-10-CM | POA: Diagnosis not present

## 2021-11-12 DIAGNOSIS — N189 Chronic kidney disease, unspecified: Secondary | ICD-10-CM | POA: Diagnosis present

## 2021-11-12 DIAGNOSIS — Z83438 Family history of other disorder of lipoprotein metabolism and other lipidemia: Secondary | ICD-10-CM

## 2021-11-12 DIAGNOSIS — R32 Unspecified urinary incontinence: Secondary | ICD-10-CM | POA: Diagnosis not present

## 2021-11-12 DIAGNOSIS — Y848 Other medical procedures as the cause of abnormal reaction of the patient, or of later complication, without mention of misadventure at the time of the procedure: Secondary | ICD-10-CM | POA: Diagnosis not present

## 2021-11-12 DIAGNOSIS — I340 Nonrheumatic mitral (valve) insufficiency: Secondary | ICD-10-CM | POA: Diagnosis not present

## 2021-11-12 DIAGNOSIS — F17210 Nicotine dependence, cigarettes, uncomplicated: Secondary | ICD-10-CM | POA: Diagnosis present

## 2021-11-12 DIAGNOSIS — Z8541 Personal history of malignant neoplasm of cervix uteri: Secondary | ICD-10-CM

## 2021-11-12 DIAGNOSIS — J9601 Acute respiratory failure with hypoxia: Secondary | ICD-10-CM | POA: Diagnosis present

## 2021-11-12 DIAGNOSIS — B965 Pseudomonas (aeruginosa) (mallei) (pseudomallei) as the cause of diseases classified elsewhere: Secondary | ICD-10-CM | POA: Diagnosis present

## 2021-11-12 DIAGNOSIS — R4182 Altered mental status, unspecified: Secondary | ICD-10-CM | POA: Diagnosis not present

## 2021-11-12 DIAGNOSIS — K319 Disease of stomach and duodenum, unspecified: Secondary | ICD-10-CM | POA: Diagnosis present

## 2021-11-12 DIAGNOSIS — R609 Edema, unspecified: Secondary | ICD-10-CM | POA: Diagnosis not present

## 2021-11-12 DIAGNOSIS — R932 Abnormal findings on diagnostic imaging of liver and biliary tract: Secondary | ICD-10-CM

## 2021-11-12 DIAGNOSIS — E669 Obesity, unspecified: Secondary | ICD-10-CM | POA: Diagnosis present

## 2021-11-12 DIAGNOSIS — E871 Hypo-osmolality and hyponatremia: Secondary | ICD-10-CM | POA: Diagnosis present

## 2021-11-12 DIAGNOSIS — Z87440 Personal history of urinary (tract) infections: Secondary | ICD-10-CM

## 2021-11-12 DIAGNOSIS — J95851 Ventilator associated pneumonia: Secondary | ICD-10-CM | POA: Diagnosis not present

## 2021-11-12 DIAGNOSIS — G9349 Other encephalopathy: Secondary | ICD-10-CM | POA: Diagnosis present

## 2021-11-12 DIAGNOSIS — J90 Pleural effusion, not elsewhere classified: Secondary | ICD-10-CM

## 2021-11-12 DIAGNOSIS — C530 Malignant neoplasm of endocervix: Secondary | ICD-10-CM | POA: Diagnosis not present

## 2021-11-12 DIAGNOSIS — G060 Intracranial abscess and granuloma: Secondary | ICD-10-CM | POA: Diagnosis not present

## 2021-11-12 DIAGNOSIS — Z8249 Family history of ischemic heart disease and other diseases of the circulatory system: Secondary | ICD-10-CM

## 2021-11-12 DIAGNOSIS — A419 Sepsis, unspecified organism: Secondary | ICD-10-CM

## 2021-11-12 DIAGNOSIS — J15212 Pneumonia due to Methicillin resistant Staphylococcus aureus: Secondary | ICD-10-CM

## 2021-11-12 DIAGNOSIS — E781 Pure hyperglyceridemia: Secondary | ICD-10-CM | POA: Diagnosis present

## 2021-11-12 DIAGNOSIS — Z87892 Personal history of anaphylaxis: Secondary | ICD-10-CM

## 2021-11-12 DIAGNOSIS — D62 Acute posthemorrhagic anemia: Secondary | ICD-10-CM | POA: Diagnosis present

## 2021-11-12 HISTORY — PX: ESOPHAGOGASTRODUODENOSCOPY: SHX5428

## 2021-11-12 HISTORY — DX: Fistula of vagina to large intestine: N82.3

## 2021-11-12 HISTORY — DX: Malignant neoplasm of cervix uteri, unspecified: C53.9

## 2021-11-12 HISTORY — DX: Personal history of colonic polyps: Z86.010

## 2021-11-12 LAB — URINALYSIS, MICROSCOPIC (REFLEX)

## 2021-11-12 LAB — URINALYSIS, ROUTINE W REFLEX MICROSCOPIC
Bilirubin Urine: NEGATIVE
Glucose, UA: 250 mg/dL — AB
Ketones, ur: NEGATIVE mg/dL
Nitrite: NEGATIVE
Protein, ur: NEGATIVE mg/dL
Specific Gravity, Urine: <1.005 — ABNORMAL LOW (ref 1.005–1.030)
pH: 6 (ref 5.0–8.0)

## 2021-11-12 LAB — I-STAT CHEM 8, ED
BUN: 15 mg/dL (ref 6–20)
Calcium, Ion: 1.02 mmol/L — ABNORMAL LOW (ref 1.15–1.40)
Chloride: 97 mmol/L — ABNORMAL LOW (ref 98–111)
Creatinine, Ser: 1.1 mg/dL — ABNORMAL HIGH (ref 0.44–1.00)
Glucose, Bld: 412 mg/dL — ABNORMAL HIGH (ref 70–99)
HCT: 29 % — ABNORMAL LOW (ref 36.0–46.0)
Hemoglobin: 9.9 g/dL — ABNORMAL LOW (ref 12.0–15.0)
Potassium: 3.1 mmol/L — ABNORMAL LOW (ref 3.5–5.1)
Sodium: 129 mmol/L — ABNORMAL LOW (ref 135–145)
TCO2: 22 mmol/L (ref 22–32)

## 2021-11-12 LAB — RAPID URINE DRUG SCREEN, HOSP PERFORMED
Amphetamines: NOT DETECTED
Barbiturates: NOT DETECTED
Benzodiazepines: POSITIVE — AB
Cocaine: NOT DETECTED
Opiates: NOT DETECTED
Tetrahydrocannabinol: NOT DETECTED

## 2021-11-12 LAB — I-STAT VENOUS BLOOD GAS, ED
Acid-base deficit: 3 mmol/L — ABNORMAL HIGH (ref 0.0–2.0)
Bicarbonate: 23.7 mmol/L (ref 20.0–28.0)
Calcium, Ion: 1.05 mmol/L — ABNORMAL LOW (ref 1.15–1.40)
HCT: 29 % — ABNORMAL LOW (ref 36.0–46.0)
Hemoglobin: 9.9 g/dL — ABNORMAL LOW (ref 12.0–15.0)
O2 Saturation: 67 %
Potassium: 3.3 mmol/L — ABNORMAL LOW (ref 3.5–5.1)
Sodium: 134 mmol/L — ABNORMAL LOW (ref 135–145)
TCO2: 25 mmol/L (ref 22–32)
pCO2, Ven: 48.9 mmHg (ref 44–60)
pH, Ven: 7.294 (ref 7.25–7.43)
pO2, Ven: 39 mmHg (ref 32–45)

## 2021-11-12 LAB — CBC
HCT: 28.9 % — ABNORMAL LOW (ref 36.0–46.0)
Hemoglobin: 8.9 g/dL — ABNORMAL LOW (ref 12.0–15.0)
MCH: 26.8 pg (ref 26.0–34.0)
MCHC: 30.8 g/dL (ref 30.0–36.0)
MCV: 87 fL (ref 80.0–100.0)
Platelets: 251 10*3/uL (ref 150–400)
RBC: 3.32 MIL/uL — ABNORMAL LOW (ref 3.87–5.11)
RDW: 16 % — ABNORMAL HIGH (ref 11.5–15.5)
WBC: 18.6 10*3/uL — ABNORMAL HIGH (ref 4.0–10.5)
nRBC: 0.2 % (ref 0.0–0.2)

## 2021-11-12 LAB — DIFFERENTIAL
Abs Immature Granulocytes: 0.27 10*3/uL — ABNORMAL HIGH (ref 0.00–0.07)
Basophils Absolute: 0.1 10*3/uL (ref 0.0–0.1)
Basophils Relative: 0 %
Eosinophils Absolute: 0 10*3/uL (ref 0.0–0.5)
Eosinophils Relative: 0 %
Immature Granulocytes: 2 %
Lymphocytes Relative: 5 %
Lymphs Abs: 0.8 10*3/uL (ref 0.7–4.0)
Monocytes Absolute: 0.7 10*3/uL (ref 0.1–1.0)
Monocytes Relative: 4 %
Neutro Abs: 16.7 10*3/uL — ABNORMAL HIGH (ref 1.7–7.7)
Neutrophils Relative %: 89 %

## 2021-11-12 LAB — POCT I-STAT 7, (LYTES, BLD GAS, ICA,H+H)
Acid-Base Excess: 0 mmol/L (ref 0.0–2.0)
Bicarbonate: 29.1 mmol/L — ABNORMAL HIGH (ref 20.0–28.0)
Calcium, Ion: 1.1 mmol/L — ABNORMAL LOW (ref 1.15–1.40)
HCT: 36 % (ref 36.0–46.0)
Hemoglobin: 12.2 g/dL (ref 12.0–15.0)
O2 Saturation: 98 %
Patient temperature: 38.6
Potassium: 4 mmol/L (ref 3.5–5.1)
Sodium: 131 mmol/L — ABNORMAL LOW (ref 135–145)
TCO2: 31 mmol/L (ref 22–32)
pCO2 arterial: 74 mmHg (ref 32–48)
pH, Arterial: 7.212 — ABNORMAL LOW (ref 7.35–7.45)
pO2, Arterial: 140 mmHg — ABNORMAL HIGH (ref 83–108)

## 2021-11-12 LAB — APTT: aPTT: 27 seconds (ref 24–36)

## 2021-11-12 LAB — ACETAMINOPHEN LEVEL: Acetaminophen (Tylenol), Serum: <10 ug/mL — ABNORMAL LOW (ref 10–30)

## 2021-11-12 LAB — RESP PANEL BY RT-PCR (FLU A&B, COVID) ARPGX2
Influenza A by PCR: NEGATIVE
Influenza B by PCR: NEGATIVE
SARS Coronavirus 2 by RT PCR: NEGATIVE

## 2021-11-12 LAB — COMPREHENSIVE METABOLIC PANEL
ALT: 12 U/L (ref 0–44)
AST: 12 U/L — ABNORMAL LOW (ref 15–41)
Albumin: 2.5 g/dL — ABNORMAL LOW (ref 3.5–5.0)
Alkaline Phosphatase: 58 U/L (ref 38–126)
Anion gap: 13 (ref 5–15)
BUN: 16 mg/dL (ref 6–20)
CO2: 20 mmol/L — ABNORMAL LOW (ref 22–32)
Calcium: 7.9 mg/dL — ABNORMAL LOW (ref 8.9–10.3)
Chloride: 93 mmol/L — ABNORMAL LOW (ref 98–111)
Creatinine, Ser: 1.26 mg/dL — ABNORMAL HIGH (ref 0.44–1.00)
GFR, Estimated: 50 mL/min — ABNORMAL LOW (ref >60)
Glucose, Bld: 417 mg/dL — ABNORMAL HIGH (ref 70–99)
Potassium: 3 mmol/L — ABNORMAL LOW (ref 3.5–5.1)
Sodium: 126 mmol/L — ABNORMAL LOW (ref 135–145)
Total Bilirubin: 0.3 mg/dL (ref 0.3–1.2)
Total Protein: 7 g/dL (ref 6.5–8.1)

## 2021-11-12 LAB — D-DIMER, QUANTITATIVE: D-Dimer, Quant: 2.36 ug/mL-FEU — ABNORMAL HIGH (ref 0.00–0.50)

## 2021-11-12 LAB — LACTIC ACID, PLASMA
Lactic Acid, Venous: 1.5 mmol/L (ref 0.5–1.9)
Lactic Acid, Venous: 2.5 mmol/L (ref 0.5–1.9)
Lactic Acid, Venous: 1.9 mmol/L (ref 0.5–1.9)

## 2021-11-12 LAB — HEMOGLOBIN AND HEMATOCRIT, BLOOD
HCT: 29.5 % — ABNORMAL LOW (ref 36.0–46.0)
HCT: 37.7 % (ref 36.0–46.0)
HCT: 31.3 % — ABNORMAL LOW (ref 36.0–46.0)
Hemoglobin: 8.7 g/dL — ABNORMAL LOW (ref 12.0–15.0)
Hemoglobin: 11.7 g/dL — ABNORMAL LOW (ref 12.0–15.0)
Hemoglobin: 9.3 g/dL — ABNORMAL LOW (ref 12.0–15.0)

## 2021-11-12 LAB — ETHANOL: Alcohol, Ethyl (B): <10 mg/dL (ref <10)

## 2021-11-12 LAB — PREPARE RBC (CROSSMATCH)

## 2021-11-12 LAB — PROTIME-INR
INR: 1.2 (ref 0.8–1.2)
Prothrombin Time: 15 seconds (ref 11.4–15.2)

## 2021-11-12 LAB — HEMOGLOBIN A1C
Hgb A1c MFr Bld: 7.2 % — ABNORMAL HIGH (ref 4.8–5.6)
Mean Plasma Glucose: 159.94 mg/dL

## 2021-11-12 LAB — BETA-HYDROXYBUTYRIC ACID: Beta-Hydroxybutyric Acid: 0.22 mmol/L (ref 0.05–0.27)

## 2021-11-12 LAB — SALICYLATE LEVEL: Salicylate Lvl: <7 mg/dL — ABNORMAL LOW (ref 7.0–30.0)

## 2021-11-12 LAB — TROPONIN I (HIGH SENSITIVITY)
Troponin I (High Sensitivity): 40 ng/L — ABNORMAL HIGH (ref <18)
Troponin I (High Sensitivity): 41 ng/L — ABNORMAL HIGH (ref <18)
Troponin I (High Sensitivity): 59 ng/L — ABNORMAL HIGH (ref <18)

## 2021-11-12 LAB — MRSA NEXT GEN BY PCR, NASAL: MRSA by PCR Next Gen: DETECTED — AB

## 2021-11-12 LAB — HIV ANTIBODY (ROUTINE TESTING W REFLEX): HIV Screen 4th Generation wRfx: NONREACTIVE

## 2021-11-12 LAB — PHOSPHORUS: Phosphorus: 5.7 mg/dL — ABNORMAL HIGH (ref 2.5–4.6)

## 2021-11-12 LAB — MAGNESIUM: Magnesium: 2 mg/dL (ref 1.7–2.4)

## 2021-11-12 LAB — I-STAT BETA HCG BLOOD, ED (MC, WL, AP ONLY): I-stat hCG, quantitative: <5 m[IU]/mL (ref <5)

## 2021-11-12 LAB — GLUCOSE, CAPILLARY: Glucose-Capillary: 323 mg/dL — ABNORMAL HIGH (ref 70–99)

## 2021-11-12 LAB — ABO/RH: ABO/RH(D): O POS

## 2021-11-12 SURGERY — EGD (ESOPHAGOGASTRODUODENOSCOPY)
Anesthesia: Moderate Sedation

## 2021-11-12 MED ORDER — FENTANYL CITRATE PF 50 MCG/ML IJ SOSY
50.0000 ug | PREFILLED_SYRINGE | Freq: Once | INTRAMUSCULAR | Status: AC
  Administered 2021-11-12: 50 ug via INTRAVENOUS

## 2021-11-12 MED ORDER — DIPHENHYDRAMINE HCL 50 MG/ML IJ SOLN
25.0000 mg | Freq: Once | INTRAMUSCULAR | Status: AC
  Administered 2021-11-12: 25 mg via INTRAVENOUS
  Filled 2021-11-12: qty 1

## 2021-11-12 MED ORDER — ALBUTEROL SULFATE (2.5 MG/3ML) 0.083% IN NEBU
2.5000 mg | INHALATION_SOLUTION | RESPIRATORY_TRACT | Status: DC | PRN
Start: 2021-11-12 — End: 2021-12-05
  Administered 2021-11-13 – 2021-11-28 (×3): 2.5 mg via RESPIRATORY_TRACT
  Filled 2021-11-12 (×3): qty 3

## 2021-11-12 MED ORDER — DOCUSATE SODIUM 50 MG/5ML PO LIQD: 100.0000 mg | Freq: Two times a day (BID) | ORAL | Status: DC | PRN

## 2021-11-12 MED ORDER — HALOPERIDOL LACTATE 5 MG/ML IJ SOLN
2.0000 mg | Freq: Once | INTRAMUSCULAR | Status: AC
  Administered 2021-11-12: 2 mg via INTRAVENOUS
  Filled 2021-11-12: qty 1

## 2021-11-12 MED ORDER — NOREPINEPHRINE 4 MG/250ML-% IV SOLN
0.0000 ug/min | INTRAVENOUS | Status: DC
  Administered 2021-11-12: 2 ug/min via INTRAVENOUS
  Administered 2021-11-13: 12 ug/min via INTRAVENOUS
  Administered 2021-11-13: 14 ug/min via INTRAVENOUS
  Administered 2021-11-13: 12 ug/min via INTRAVENOUS
  Administered 2021-11-14: 6 ug/min via INTRAVENOUS
  Administered 2021-11-15: 4 ug/min via INTRAVENOUS
  Administered 2021-11-16: 2 ug/min via INTRAVENOUS
  Administered 2021-11-17: 7 ug/min via INTRAVENOUS
  Administered 2021-11-18: 3 ug/min via INTRAVENOUS
  Administered 2021-11-19: 5 ug/min via INTRAVENOUS
  Filled 2021-11-12 (×9): qty 250

## 2021-11-12 MED ORDER — LEVETIRACETAM IN NACL 1500 MG/100ML IV SOLN
1500.0000 mg | Freq: Two times a day (BID) | INTRAVENOUS | Status: DC
Start: 2021-11-13 — End: 2021-12-10
  Administered 2021-11-13 – 2021-12-10 (×55): 1500 mg via INTRAVENOUS
  Filled 2021-11-12 (×59): qty 100

## 2021-11-12 MED ORDER — ACETAMINOPHEN 500 MG PO TABS: 1000.0000 mg | ORAL_TABLET | Freq: Once | ORAL | Status: DC

## 2021-11-12 MED ORDER — LORAZEPAM 2 MG/ML IJ SOLN
1.0000 mg | Freq: Once | INTRAMUSCULAR | Status: AC
  Administered 2021-11-12: 1 mg via INTRAVENOUS
  Filled 2021-11-12: qty 1

## 2021-11-12 MED ORDER — FENTANYL 2500MCG IN NS 250ML (10MCG/ML) PREMIX INFUSION: 0.0000 ug/h | INTRAVENOUS | Status: DC

## 2021-11-12 MED ORDER — MIDAZOLAM HCL 2 MG/2ML IJ SOLN: 4.0000 mg | Freq: Once | INTRAMUSCULAR | Status: AC

## 2021-11-12 MED ORDER — CHLORHEXIDINE GLUCONATE 0.12% ORAL RINSE (MEDLINE KIT)
15.0000 mL | Freq: Two times a day (BID) | OROMUCOSAL | Status: DC
  Administered 2021-11-12 – 2021-12-05 (×43): 15 mL via OROMUCOSAL

## 2021-11-12 MED ORDER — FENTANYL CITRATE PF 50 MCG/ML IJ SOSY
100.0000 ug | PREFILLED_SYRINGE | Freq: Once | INTRAMUSCULAR | Status: AC
  Administered 2021-11-12: 100 ug via INTRAVENOUS

## 2021-11-12 MED ORDER — PANTOPRAZOLE SODIUM 40 MG IV SOLR
40.0000 mg | Freq: Two times a day (BID) | INTRAVENOUS | Status: DC
Start: 2021-11-16 — End: 2021-11-12

## 2021-11-12 MED ORDER — LACTATED RINGERS IV BOLUS (SEPSIS)
1000.0000 mL | Freq: Once | INTRAVENOUS | Status: AC
  Administered 2021-11-12: 1000 mL via INTRAVENOUS

## 2021-11-12 MED ORDER — SODIUM CHLORIDE 0.9 % IV SOLN
2.0000 g | Freq: Two times a day (BID) | INTRAVENOUS | Status: DC
Start: 2021-11-13 — End: 2021-11-14
  Administered 2021-11-13 – 2021-11-14 (×3): 2 g via INTRAVENOUS
  Filled 2021-11-12 (×5): qty 20

## 2021-11-12 MED ORDER — CHLORHEXIDINE GLUCONATE CLOTH 2 % EX PADS
6.0000 | MEDICATED_PAD | Freq: Every day | CUTANEOUS | Status: DC
  Administered 2021-11-12 – 2021-12-04 (×23): 6 via TOPICAL

## 2021-11-12 MED ORDER — ORAL CARE MOUTH RINSE
15.0000 mL | OROMUCOSAL | Status: DC
  Administered 2021-11-12 – 2021-12-09 (×233): 15 mL via OROMUCOSAL

## 2021-11-12 MED ORDER — MIDAZOLAM BOLUS VIA INFUSION
0.0000 mg | INTRAVENOUS | Status: DC | PRN
  Filled 2021-11-12: qty 5

## 2021-11-12 MED ORDER — MIDAZOLAM HCL 2 MG/2ML IJ SOLN
2.0000 mg | Freq: Once | INTRAMUSCULAR | Status: AC
  Administered 2021-11-12: 2 mg via INTRAVENOUS

## 2021-11-12 MED ORDER — CALCIUM GLUCONATE-NACL 1-0.675 GM/50ML-% IV SOLN
1.0000 g | Freq: Once | INTRAVENOUS | Status: AC
  Administered 2021-11-12: 1000 mg via INTRAVENOUS
  Filled 2021-11-12: qty 50

## 2021-11-12 MED ORDER — CHLORHEXIDINE GLUCONATE 0.12% ORAL RINSE (MEDLINE KIT): 15.0000 mL | Freq: Two times a day (BID) | OROMUCOSAL | Status: DC

## 2021-11-12 MED ORDER — VANCOMYCIN HCL 750 MG/150ML IV SOLN
750.0000 mg | Freq: Two times a day (BID) | INTRAVENOUS | Status: DC
  Administered 2021-11-13 – 2021-11-14 (×4): 750 mg via INTRAVENOUS
  Filled 2021-11-12 (×6): qty 150

## 2021-11-12 MED ORDER — FENTANYL CITRATE PF 50 MCG/ML IJ SOSY
PREFILLED_SYRINGE | INTRAMUSCULAR | Status: AC
  Filled 2021-11-12: qty 2

## 2021-11-12 MED ORDER — IOHEXOL 350 MG/ML SOLN
100.0000 mL | Freq: Once | INTRAVENOUS | Status: AC | PRN
  Administered 2021-11-12: 100 mL via INTRAVENOUS

## 2021-11-12 MED ORDER — FENTANYL 2500MCG IN NS 250ML (10MCG/ML) PREMIX INFUSION
50.0000 ug/h | INTRAVENOUS | Status: DC
  Administered 2021-11-13: 150 ug/h via INTRAVENOUS
  Administered 2021-11-14 – 2021-11-19 (×6): 100 ug/h via INTRAVENOUS
  Administered 2021-11-20 – 2021-11-21 (×3): 200 ug/h via INTRAVENOUS
  Administered 2021-11-21 – 2021-11-22 (×2): 180 ug/h via INTRAVENOUS
  Filled 2021-11-12 (×13): qty 250

## 2021-11-12 MED ORDER — MIDAZOLAM HCL 2 MG/2ML IJ SOLN
INTRAMUSCULAR | Status: AC
  Filled 2021-11-12: qty 2

## 2021-11-12 MED ORDER — VANCOMYCIN HCL 1500 MG/300ML IV SOLN
1500.0000 mg | Freq: Once | INTRAVENOUS | Status: AC
  Administered 2021-11-12: 1500 mg via INTRAVENOUS
  Filled 2021-11-12: qty 300

## 2021-11-12 MED ORDER — INSULIN ASPART 100 UNIT/ML IJ SOLN
0.0000 [IU] | INTRAMUSCULAR | Status: DC
  Administered 2021-11-12: 11 [IU] via SUBCUTANEOUS
  Administered 2021-11-13: 2 [IU] via SUBCUTANEOUS
  Administered 2021-11-13: 3 [IU] via SUBCUTANEOUS
  Administered 2021-11-14 (×2): 5 [IU] via SUBCUTANEOUS

## 2021-11-12 MED ORDER — PANTOPRAZOLE INFUSION (NEW) - SIMPLE MED
8.0000 mg/h | INTRAVENOUS | Status: DC
  Filled 2021-11-12: qty 100

## 2021-11-12 MED ORDER — MIDAZOLAM HCL 2 MG/2ML IJ SOLN
INTRAMUSCULAR | Status: AC
  Administered 2021-11-12: 4 mg via INTRAVENOUS
  Filled 2021-11-12: qty 4

## 2021-11-12 MED ORDER — SODIUM BICARBONATE 8.4 % IV SOLN
INTRAVENOUS | Status: AC | PRN
  Administered 2021-11-12: 50 meq via INTRAVENOUS

## 2021-11-12 MED ORDER — LACTATED RINGERS IV BOLUS
1000.0000 mL | Freq: Once | INTRAVENOUS | Status: AC
  Administered 2021-11-12: 1000 mL via INTRAVENOUS

## 2021-11-12 MED ORDER — INSULIN ASPART 100 UNIT/ML IJ SOLN: 0.0000 [IU] | INTRAMUSCULAR | Status: DC

## 2021-11-12 MED ORDER — FENTANYL 2500MCG IN NS 250ML (10MCG/ML) PREMIX INFUSION
INTRAVENOUS | Status: AC
  Filled 2021-11-12: qty 250

## 2021-11-12 MED ORDER — FENTANYL CITRATE PF 50 MCG/ML IJ SOSY: 50.0000 ug | PREFILLED_SYRINGE | Freq: Once | INTRAMUSCULAR | Status: DC

## 2021-11-12 MED ORDER — PANTOPRAZOLE SODIUM 40 MG IV SOLR
40.0000 mg | Freq: Two times a day (BID) | INTRAVENOUS | Status: DC
  Administered 2021-11-16 – 2021-11-17 (×4): 40 mg via INTRAVENOUS
  Filled 2021-11-12 (×4): qty 10

## 2021-11-12 MED ORDER — DEXTROSE 5 % IV SOLN
10.0000 mg/kg | Freq: Three times a day (TID) | INTRAVENOUS | Status: DC
  Administered 2021-11-12 – 2021-11-13 (×3): 755 mg via INTRAVENOUS
  Filled 2021-11-12 (×6): qty 15.1

## 2021-11-12 MED ORDER — FENTANYL BOLUS VIA INFUSION
50.0000 ug | INTRAVENOUS | Status: DC | PRN
  Administered 2021-11-15: 75 ug via INTRAVENOUS
  Administered 2021-11-15: 50 ug via INTRAVENOUS
  Filled 2021-11-12: qty 100

## 2021-11-12 MED ORDER — POTASSIUM CHLORIDE 10 MEQ/100ML IV SOLN
10.0000 meq | INTRAVENOUS | Status: AC
  Administered 2021-11-12 – 2021-11-13 (×4): 10 meq via INTRAVENOUS
  Filled 2021-11-12 (×4): qty 100

## 2021-11-12 MED ORDER — ORAL CARE MOUTH RINSE: 15.0000 mL | OROMUCOSAL | Status: DC

## 2021-11-12 MED ORDER — EPINEPHRINE 1 MG/10ML IJ SOSY
PREFILLED_SYRINGE | INTRAMUSCULAR | Status: AC | PRN
Start: 2021-11-12 — End: 2021-11-12
  Administered 2021-11-12: 1 mg via INTRAVENOUS

## 2021-11-12 MED ORDER — POLYETHYLENE GLYCOL 3350 17 G PO PACK: 17.0000 g | PACK | Freq: Every day | ORAL | Status: DC | PRN

## 2021-11-12 MED ORDER — FENTANYL CITRATE PF 50 MCG/ML IJ SOSY
PREFILLED_SYRINGE | INTRAMUSCULAR | Status: AC
  Filled 2021-11-12: qty 1

## 2021-11-12 MED ORDER — SODIUM CHLORIDE 0.9 % IV SOLN: INTRAVENOUS | Status: DC

## 2021-11-12 MED ORDER — MIDAZOLAM-SODIUM CHLORIDE 100-0.9 MG/100ML-% IV SOLN
0.0000 mg/h | INTRAVENOUS | Status: DC
  Administered 2021-11-12: 10 mg/h via INTRAVENOUS
  Administered 2021-11-13: 9 mg/h via INTRAVENOUS
  Administered 2021-11-13: 16 mg/h via INTRAVENOUS
  Administered 2021-11-13: 14 mg/h via INTRAVENOUS
  Filled 2021-11-12 (×5): qty 100

## 2021-11-12 MED ORDER — SODIUM CHLORIDE 0.9% FLUSH: 3.0000 mL | Freq: Once | INTRAVENOUS | Status: DC

## 2021-11-12 MED ORDER — PROPOFOL 1000 MG/100ML IV EMUL
5.0000 ug/kg/min | INTRAVENOUS | Status: DC
  Administered 2021-11-12: 40 ug/kg/min via INTRAVENOUS
  Administered 2021-11-12 – 2021-11-13 (×2): 25 ug/kg/min via INTRAVENOUS
  Administered 2021-11-13: 30 ug/kg/min via INTRAVENOUS
  Administered 2021-11-14: 5 ug/kg/min via INTRAVENOUS
  Administered 2021-11-14: 25 ug/kg/min via INTRAVENOUS
  Administered 2021-11-15: 20 ug/kg/min via INTRAVENOUS
  Administered 2021-11-15: 25 ug/kg/min via INTRAVENOUS
  Administered 2021-11-16: 40 ug/kg/min via INTRAVENOUS
  Administered 2021-11-16: 25 ug/kg/min via INTRAVENOUS
  Administered 2021-11-16: 30 ug/kg/min via INTRAVENOUS
  Administered 2021-11-17: 35 ug/kg/min via INTRAVENOUS
  Administered 2021-11-17 (×4): 30 ug/kg/min via INTRAVENOUS
  Administered 2021-11-18: 40 ug/kg/min via INTRAVENOUS
  Administered 2021-11-19 (×2): 30 ug/kg/min via INTRAVENOUS
  Filled 2021-11-12: qty 100
  Filled 2021-11-12: qty 200
  Filled 2021-11-12 (×18): qty 100

## 2021-11-12 MED ORDER — PANTOPRAZOLE 80MG IVPB - SIMPLE MED
80.0000 mg | Freq: Once | INTRAVENOUS | Status: AC
Start: 2021-11-12 — End: 2021-11-12
  Administered 2021-11-12: 80 mg via INTRAVENOUS
  Filled 2021-11-12: qty 80

## 2021-11-12 MED ORDER — SODIUM CHLORIDE 0.9 % IV SOLN
4500.0000 mg | INTRAVENOUS | Status: AC
  Administered 2021-11-12: 4500 mg via INTRAVENOUS
  Filled 2021-11-12: qty 45

## 2021-11-12 MED ORDER — FENTANYL 2500MCG IN NS 250ML (10MCG/ML) PREMIX INFUSION
0.0000 ug/h | INTRAVENOUS | Status: DC
  Administered 2021-11-12: 25 ug/h via INTRAVENOUS

## 2021-11-12 MED ORDER — SODIUM CHLORIDE 0.9 % IV SOLN
2.0000 g | Freq: Once | INTRAVENOUS | Status: AC
  Administered 2021-11-12: 2 g via INTRAVENOUS
  Filled 2021-11-12: qty 20

## 2021-11-12 MED ORDER — MIDAZOLAM HCL 2 MG/2ML IJ SOLN
2.0000 mg | Freq: Once | INTRAMUSCULAR | Status: AC
  Administered 2021-11-12: 2 mg via INTRAVENOUS
  Filled 2021-11-12: qty 2

## 2021-11-12 NOTE — ED Notes (Signed)
Called staffing for safety sitter and per staffing, no sitters available at this time.

## 2021-11-12 NOTE — Consult Note (Signed)
Consultation  Referring Provider:     Dr. Lamonte Sakai pulmonary critical care medicine Primary Care Physician:  Gweneth Fritter, FNP Primary Gastroenterologist:      Althia Forts Reason for Consultation:     GI bleed, "hematemesis"     Impression / Plan:   1) Upper GI bleed - copious red blood output via OG tube after PEA and intubation hemoglobin has risen to 11.7 and then 12 for unclear reasons.  She did get 1 unit of packed red cells.  There is clearly significant blood out the OG tube.  2) Hemoptysis likely also question relationship to OG tube blood  3) Pneumonia/Spesis suspected  4) Suspected excessive NSAID (ibuprofen) and salicylate (Goody's) use  5) S/p Hartmann's procedure w/ colostomy for Rectovaginal fistula after XRT and other Tx for cervical cancer  6) possible left liver lobe lesion, there is a geographic distribution the radiologist raises the possibility of mass.  I will perform EGD tonight - I think given uncertainty as to what caused PEA and continued bloody OG output this is most prudent course as may be able to treat bleeding (lesion(s). I have explained risks benefits and indications to son, Allene Pyo, and he has provided informed consent. This was witnessed by Larkin Ina, Therapist, sports.  Further plans pending the EGD  Depending upon clinical course at some point I suppose an MRI would help sort out what the liver issue is statistically I would bet most likely this is focal fatty sparing issues but will need follow-up at some point when stable and appropriate.  I appreciate the opportunity to care for her.  Gatha Mayer, MD, [facility] 11/12/2021 8:56 PM        HPI:   Kristen Murphy is a 56 y.o. female with a past medical history that includes diabetes mellitus type 2, seizure disorder, stroke, substance abuse, cervical cancer, chronic kidney disease, coronary artery disease and anemia who was admitted this evening after about a week long history of  fever and respiratory symptoms, she had some altered mental status and actually had a pulseless electrical activity arrest in the ER for about 2 minutes and was resuscitated.  She was intubated.  Prior to that there was a question of slight hemoptysis or blood from the oral cavity.  After the intubation she had an OG tube placed and copious amounts of red blood were produced.  Per discussions with the family it sounds like she had been using a lot of ibuprofen and or Goody powder.  She did have an EGD last April at [facility], with a small GE junction ulcer.  She also had a colonoscopy with random biopsies because of diarrhea issues there was a rectal ulcer as well.  Tubular adenoma removed, normal random biopsies, and benign rectal ulcer.  The colonoscopy was otherwise unremarkable except for a diverticulum.  The ulcer was thought to be a stercoral ulcer.  She has undergone a Hartman's procedure with sigmoid colostomy because of a rectovaginal fistula problem that developed after radiation treatment for cervical cancer.  Apparently she was cured of the cervical cancer.  Since she was admitted she has had some myoclonic jerking but vital signs have been stable she remains febrile.  She did receive 1 unit of packed red cells.  Hemoglobin on September 14, 2021 was 9.2.  During and at the end of admission for her end colostomy.  She actually had pyelonephritis and required a right radical nephrectomy as well.  Past Medical History:  Diagnosis Date   Anemia    Anxiety    Arthritis    Asthma    Cervical cancer ([place])    Chronic kidney disease    Coronary artery disease    Depression    Diabetes mellitus without complication (HCC)    Dyspnea    Headache    Heart murmur    Hx of adenomatous polyp of colon    Hypertension    Rectovaginal fistula    Seizures ([place])    Stercoral ulcer of rectum 12/2020   [facility]     Past Surgical History:  Procedure Laterality Date   CARDIAC CATHETERIZATION      COLONOSCOPY  12/2020   Subcentimeter adenoma, stercoral ulcer   COLOSTOMY  08/2021   CORONARY STENT INTERVENTION N/A 02/19/2019   Procedure: CORONARY STENT INTERVENTION;  Surgeon: Lorretta Harp, MD;  Location: [place] [facility];  Service: Cardiovascular;  Laterality: N/A;   ESOPHAGOGASTRODUODENOSCOPY  12/2020   Small GE junction ulcer and small hiatal hernia   LEFT HEART CATH AND CORONARY ANGIOGRAPHY N/A 02/19/2019   Procedure: LEFT HEART CATH AND CORONARY ANGIOGRAPHY;  Surgeon: Lorretta Harp, MD;  Location: [place] [facility];  Service: Cardiovascular;  Laterality: N/A;   NEPHRECTOMY Right 08/2021   TONSILLECTOMY      Family History  Problem Relation Age of Onset   Hypertension Mother    Hyperlipidemia Mother    Hypertension Brother    Diabetes Maternal Uncle     Social History   Tobacco Use   Smoking status: Every Day    Packs/day: 3.00    Years: 38.00    Pack years: 114.00    Types: Cigarettes   Smokeless tobacco: Never  Vaping Use   Vaping Use: Some days  Substance Use Topics   Alcohol use: Not Currently   Drug use: Not Currently    Types: Cocaine, Benzodiazepines, Amphetamines, "Crack" cocaine, Marijuana, Opium, Methylphenidate, Heroin    Comment: recovering addict    Prior to Admission medications   Medication Sig Start Date End Date Taking? Authorizing Provider  albuterol (VENTOLIN HFA) 108 (90 Base) MCG/ACT inhaler Inhale 2 puffs into the lungs every 6 (six) hours as needed for wheezing or shortness of breath.    [provider]  amLODipine (NORVASC) 10 MG tablet TAKE 1 TABLET BY MOUTH ONCE DAILY 04/12/20   Tobb, Kardie, DO  aspirin 81 MG EC tablet Take 1 tablet (81 mg total) by mouth daily. 02/20/19   Cheryln Manly, NP  atorvastatin (LIPITOR) 80 MG tablet Take 1 tablet (80 mg total) by mouth daily at 6 PM. 02/20/19   Cheryln Manly, NP  dapagliflozin propanediol (FARXIGA) 10 MG TABS tablet Take 10 mg by mouth daily.    [provider]  gabapentin (NEURONTIN) 400 MG capsule Take 400 mg by mouth 3 (three) times daily.    [provider]  ibuprofen (ADVIL) 800 MG tablet Take 1 tablet by mouth every 6 (six) hours as needed for fever, headache, mild pain, moderate pain or cramping. 02/04/20   [provider]  lisinopril (ZESTRIL) 40 MG tablet Take 40 mg by mouth daily.    [provider]  metFORMIN (GLUCOPHAGE) 1000 MG tablet Take 1 tablet (1,000 mg total) by mouth 2 (two) times daily with a meal. 02/20/19   Cheryln Manly, NP  metoprolol tartrate (LOPRESSOR) 50 MG tablet Take 1 tablet (50 mg total) by mouth 2 (two) times daily. 02/20/19   Reino Bellis  B, NP  nitroGLYCERIN (NITROSTAT) 0.4 MG SL tablet Place 1 tablet (0.4 mg total) under the tongue every 5 (five) minutes x 3 doses as needed for chest pain. 02/20/19   Cheryln Manly, NP  ondansetron (ZOFRAN-ODT) 4 MG disintegrating tablet Take 1 tablet by mouth every 8 (eight) hours as needed for nausea, vomiting or refractory nausea / vomiting.    [provider]    Current Facility-Administered Medications  Medication Dose Route Frequency Provider Last Rate Last Admin   0.9 %  sodium chloride infusion   Intravenous Continuous Mancheril, Darnell Level, RPH       acyclovir (ZOVIRAX) 755 mg in dextrose 5 % 150 mL IVPB  10 mg/kg Intravenous Q8H Mancheril, Darnell Level, RPH       albuterol (PROVENTIL) (2.5 MG/3ML) 0.083% nebulizer solution 2.5 mg  2.5 mg Nebulization Q2H PRN Jennelle Human B, NP       calcium gluconate 1 g/ 50 mL sodium chloride IVPB  1 g Intravenous Once Jennelle Human B, NP 50 mL/hr at 11/12/21 2027 1,000 mg at 11/12/21 2027   [START ON 11/13/2021] cefTRIAXone (ROCEPHIN) 2 g in sodium chloride 0.9 % 100 mL IVPB  2 g Intravenous Q12H Collene Gobble, MD       chlorhexidine gluconate (MEDLINE KIT) (PERIDEX) 0.12 % solution 15 mL  15 mL Mouth Rinse BID Jennelle Human B, NP   15 mL at 11/12/21 1944   Chlorhexidine  Gluconate Cloth 2 % PADS 6 each  6 each Topical Daily Collene Gobble, MD   6 each at 11/12/21 1945   docusate (COLACE) 50 MG/5ML liquid 100 mg  100 mg Per Tube BID PRN Jennelle Human B, NP       fentaNYL (SUBLIMAZE) bolus via infusion 50-100 mcg  50-100 mcg Intravenous Q15 min PRN Arnell Asal, NP       fentaNYL (SUBLIMAZE) injection 50 mcg  50 mcg Intravenous Once Jennelle Human B, NP       fentaNYL 2540mcg in NS 217mL (32mcg/ml) infusion-PREMIX  50-200 mcg/hr Intravenous Continuous Jennelle Human B, NP 10 mL/hr at 11/12/21 1943 100 mcg/hr at 11/12/21 1943   insulin aspart (novoLOG) injection 0-15 Units  0-15 Units Subcutaneous Q4H Jennelle Human B, NP       levETIRAcetam (KEPPRA) 4,500 mg in sodium chloride 0.9 % 250 mL IVPB  4,500 mg Intravenous NOW Etheleen Nicks, MD       [START ON 11/13/2021] levETIRAcetam (KEPPRA) IVPB 1500 mg/ 100 mL premix  1,500 mg Intravenous Q12H Etheleen Nicks, MD       MEDLINE mouth rinse  15 mL Mouth Rinse 10 times per day Jennelle Human B, NP       midazolam (VERSED) 100 mg/100 mL (1 mg/mL) premix infusion  0-10 mg/hr Intravenous Continuous Estill Cotta, NP       midazolam (VERSED) bolus via infusion 0-5 mg  0-5 mg Intravenous Q1H PRN Estill Cotta, NP       [START ON 11/16/2021] pantoprazole (PROTONIX) injection 40 mg  40 mg Intravenous Q12H Wyvonnia Dusky, MD       pantoprozole (PROTONIX) 80 mg /NS 100 mL infusion  8 mg/hr Intravenous Continuous Trifan, Carola Rhine, MD       polyethylene glycol (MIRALAX / GLYCOLAX) packet 17 g  17 g Per Tube Daily PRN Jennelle Human B, NP       potassium chloride 10 mEq in 100 mL IVPB  10 mEq Intravenous Q1 Hr x 4  Jennelle Human B, NP 100 mL/hr at 11/12/21 2047 10 mEq at 11/12/21 2047   propofol (DIPRIVAN) 1000 MG/100ML infusion  5-50 mcg/kg/min Intravenous Titrated Jennelle Human B, NP 6.8 mL/hr at 11/12/21 1940 15 mcg/kg/min at 11/12/21 1940   sodium chloride flush (NS) 0.9 % injection 3 mL  3 mL Intravenous Once  Wyvonnia Dusky, MD       vancomycin Alcus Dad) IVPB 1500 mg/300 mL  1,500 mg Intravenous Once Lavenia Atlas, RPH 150 mL/hr at 11/12/21 1946 1,500 mg at 11/12/21 1946   [START ON 11/13/2021] vancomycin (VANCOREADY) IVPB 750 mg/150 mL  750 mg Intravenous Q12H MancherilDarnell Level, RPH        Allergies as of 11/12/2021 - Review Complete 11/12/2021  Allergen Reaction Noted   Glimepiride Anaphylaxis 04/18/2020   Sulfa antibiotics Anaphylaxis and Hives 02/20/2017   Effexor [venlafaxine] Hives 02/20/2017     Review of Systems:    This is positive for those things mentioned in the HPI and unable to be ascertained due to intubated and sedated status otherwise.      Physical Exam:  Vital signs in last 24 hours: Temp:  [99.8 F (37.7 C)-101.4 F (38.6 C)] 100.1 F (37.8 C) (02/19 1935) Pulse Rate:  [92-128] 92 (02/19 1935) Resp:  [8-79] 30 (02/19 1935) BP: (85-199)/(60-181) 118/105 (02/19 1935) SpO2:  [0 %-100 %] 100 % (02/19 2015) FiO2 (%):  [100 %] 100 % (02/19 1935) Weight:  [75.6 kg] 75.6 kg (02/19 1536)    General:  Acutely ill middle-aged white woman on the ventilator.  There is an orogastric tube in place draining bloody fluid.   ENT:   Endotracheal tube and orogastric tube in place Neck:    w/o thyromegaly or mass.  Lungs: Coarse breath sounds anteriorly with auscultation bilaterally. Heart:   Distant S1S2, no rubs, murmurs, gallops. Abdomen: Obese with midline scar, there is a left lower quadrant colostomy with brown stool.  Nontender bowel sounds present.   Lymph:  no cervical or supraclavicular adenopathy. Extremities:   no edema Skin   no rash. Neuro:  She is sedated and having myoclonic jerks    Data Reviewed:   LAB RESULTS: Recent Labs    11/12/21 1508 11/12/21 1514 11/12/21 1810 11/12/21 1931 11/12/21 1946  WBC 18.6*  --   --   --   --   HGB 8.9*   < > 8.7* 11.7* 12.2  HCT 28.9*   < > 29.5* 37.7 36.0  PLT 251  --   --   --   --    < > =  values in this interval not displayed.   BMET Recent Labs    11/12/21 1508 11/12/21 1514 11/12/21 1759 11/12/21 1946  NA 126* 129* 134* 131*  K 3.0* 3.1* 3.3* 4.0  CL 93* 97*  --   --   CO2 20*  --   --   --   GLUCOSE 417* 412*  --   --   BUN 16 15  --   --   CREATININE 1.26* 1.10*  --   --   CALCIUM 7.9*  --   --   --    LFT Recent Labs    11/12/21 1508  PROT 7.0  ALBUMIN 2.5*  AST 12*  ALT 12  ALKPHOS 58  BILITOT 0.3   PT/INR Recent Labs    11/12/21 1508  LABPROT 15.0  INR 1.2     STUDIES: CT HEAD WO CONTRAST (5MM)  Result Date:  11/12/2021 CLINICAL DATA:  Altered mental status following cardiac arrest, initial encounter EXAM: CT HEAD WITHOUT CONTRAST TECHNIQUE: Contiguous axial images were obtained from the base of the skull through the vertex without intravenous contrast. RADIATION DOSE REDUCTION: This exam was performed according to the departmental dose-optimization program which includes automated exposure control, adjustment of the mA and/or kV according to patient size and/or use of iterative reconstruction technique. COMPARISON:  CT from earlier in the same day. FINDINGS: Brain: No findings to suggest acute hemorrhage, acute infarction or space-occupying mass lesion are noted. 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 No new focal abnormality is seen. Vascular: No hyperdense vessel or unexpected calcification. Skull: Normal. Negative for fracture or focal lesion. Sinuses/Orbits: No acute finding. Other: None. IMPRESSION: No acute abnormality noted. No significant change from the prior exam. Electronically Signed   By: Inez Catalina M.D.   On: 11/12/2021 19:14   CT Angio Chest PE W and/or Wo Contrast  Result Date: 11/12/2021 CLINICAL DATA:  Recent stroke-like symptoms, possible sepsis EXAM: CT ANGIOGRAPHY CHEST CT ABDOMEN AND PELVIS WITH CONTRAST TECHNIQUE: Multidetector CT imaging of the chest was performed using the standard protocol during bolus administration of  intravenous contrast. Multiplanar CT image reconstructions and MIPs were obtained to evaluate the vascular anatomy. Multidetector CT imaging of the abdomen and pelvis was performed using the standard protocol during bolus administration of intravenous contrast. RADIATION DOSE REDUCTION: This exam was performed according to the departmental dose-optimization program which includes automated exposure control, adjustment of the mA and/or kV according to patient size and/or use of iterative reconstruction technique. CONTRAST:  144m OMNIPAQUE IOHEXOL 350 MG/ML SOLN COMPARISON:  Chest x-ray from earlier in the same day, CT from 09/08/2021 FINDINGS: CTA CHEST FINDINGS Cardiovascular: Atherosclerotic calcifications are noted. No aneurysmal dilatation or dissection of the thoracic aorta is seen. No cardiac enlargement is noted. Scattered coronary calcifications are noted. The pulmonary artery shows a normal branching pattern. No filling defect to suggest pulmonary embolism is seen. 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 Mediastinum/Nodes: Thoracic inlet is within normal limits. Endotracheal tube is noted in satisfactory position. Gastric catheter extends into the stomach. The esophagus is within normal limits. No sizable hilar or mediastinal adenopathy is noted Lungs/Pleura: Lungs are well aerated bilaterally. Diffuse consolidation is noted in the medial aspect of the right lower lobe consistent with acute infiltrate. Some associated cavitary changes are noted. No sizable effusion is noted. No other focal infiltrate is noted. Mild left basilar atelectasis is seen. Well-formed bleb is noted in the medial aspect of the left lung base which was not present on the prior exam. Air-fluid level is noted within. Previously noted effusions bilaterally have resolved in the interval. Musculoskeletal: Degenerative changes of the thoracic spine are noted. No acute rib abnormality is seen. Review of the  MIP images confirms the above findings. CT ABDOMEN and PELVIS FINDINGS Hepatobiliary: Gallbladder is decompressed. Liver demonstrates a normal enhancement pattern with the exception of a geographic area of decreased attenuation in the lateral segment of the left lobe of the liver. This has a somewhat masslike appearance with hepatic contour change best seen on image number 26 of series 5. This was not present on the prior examination. Given the somewhat masslike density the possibility of a neoplastic disease could not be totally excluded given the patient's clinical history. Pancreas: Unremarkable. No pancreatic ductal dilatation or surrounding inflammatory changes. Spleen: Normal in size without focal abnormality. Adrenals/Urinary Tract: Adrenal glands are within normal limits.  The left kidney demonstrates a normal enhancement pattern. No renal calculi or obstructive changes are noted. Right kidney has been surgically removed. Ureter is within normal limits. Bladder is incompletely distended. Stomach/Bowel: Hartmann's pouch is noted in the mid descending colon. Left upper quadrant colostomy is seen assistant with the patient's given clinical history. No obstructive or inflammatory changes are noted. The appendix is within normal limits. No small bowel or gastric abnormality is noted. Vascular/Lymphatic: Aortic atherosclerosis. No enlarged abdominal or pelvic lymph nodes. Reproductive: Uterus and bilateral adnexa are unremarkable. Other: No abdominal wall hernia or abnormality. No abdominopelvic ascites. Musculoskeletal: No acute or significant osseous findings. Review of the MIP images confirms the above findings. IMPRESSION: CTA of the chest: No evidence of pulmonary emboli. Large mildly cavitary infiltrate in superior segment of right lower lobe consistent with acute pneumonia. Some minimal infiltrate is noted in the left base as well. Small bleb with air-fluid level within is noted in the medial aspect of the  left base. These findings are all new from the prior exam. Resolution of previously seen effusions. 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 CT of the abdomen and pelvis: Postsurgical changes consistent with colostomy. Area of decreased attenuation and enhancement within the lateral segment of the left lobe of the liver with a somewhat mass like appearance along the margin of the liver. Possible metastatic disease would deserve consideration. Status post right nephrectomy. Electronically Signed   By: Inez Catalina M.D.   On: 11/12/2021 19:33   CT ABDOMEN PELVIS W CONTRAST  Result Date: 11/12/2021 CLINICAL DATA:  Recent stroke-like symptoms, possible sepsis EXAM: CT ANGIOGRAPHY CHEST CT ABDOMEN AND PELVIS WITH CONTRAST TECHNIQUE: Multidetector CT imaging of the chest was performed using the standard protocol during bolus administration of intravenous contrast. Multiplanar CT image reconstructions and MIPs were obtained to evaluate the vascular anatomy. Multidetector CT imaging of the abdomen and pelvis was performed using the standard protocol during bolus administration of intravenous contrast. RADIATION DOSE REDUCTION: This exam was performed according to the departmental dose-optimization program which includes automated exposure control, adjustment of the mA and/or kV according to patient size and/or use of iterative reconstruction technique. CONTRAST:  184mL OMNIPAQUE IOHEXOL 350 MG/ML SOLN COMPARISON:  Chest x-ray from earlier in the same day, CT from 09/08/2021 FINDINGS: CTA CHEST FINDINGS Cardiovascular: Atherosclerotic calcifications are noted. No aneurysmal dilatation or dissection of the thoracic aorta is seen. No cardiac enlargement is noted. Scattered coronary calcifications are noted. The pulmonary artery shows a normal branching pattern. No filling defect to suggest pulmonary embolism is seen. 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 Mediastinum/Nodes:  Thoracic inlet is within normal limits. Endotracheal tube is noted in satisfactory position. Gastric catheter extends into the stomach. The esophagus is within normal limits. No sizable hilar or mediastinal adenopathy is noted Lungs/Pleura: Lungs are well aerated bilaterally. Diffuse consolidation is noted in the medial aspect of the right lower lobe consistent with acute infiltrate. Some associated cavitary changes are noted. No sizable effusion is noted. No other focal infiltrate is noted. Mild left basilar atelectasis is seen. Well-formed bleb is noted in the medial aspect of the left lung base which was not present on the prior exam. Air-fluid level is noted within. Previously noted effusions bilaterally have resolved in the interval. Musculoskeletal: Degenerative changes of the thoracic spine are noted. No acute rib abnormality is seen. Review of the MIP images confirms the above findings. CT ABDOMEN and PELVIS FINDINGS Hepatobiliary: Gallbladder is decompressed.  Liver demonstrates a normal enhancement pattern with the exception of a geographic area of decreased attenuation in the lateral segment of the left lobe of the liver. This has a somewhat masslike appearance with hepatic contour change best seen on image number 26 of series 5. This was not present on the prior examination. Given the somewhat masslike density the possibility of a neoplastic disease could not be totally excluded given the patient's clinical history. Pancreas: Unremarkable. No pancreatic ductal dilatation or surrounding inflammatory changes. Spleen: Normal in size without focal abnormality. Adrenals/Urinary Tract: Adrenal glands are within normal limits. The left kidney demonstrates a normal enhancement pattern. No renal calculi or obstructive changes are noted. Right kidney has been surgically removed. Ureter is within normal limits. Bladder is incompletely distended. Stomach/Bowel: Hartmann's pouch is noted in the mid descending colon.  Left upper quadrant colostomy is seen assistant with the patient's given clinical history. No obstructive or inflammatory changes are noted. The appendix is within normal limits. No small bowel or gastric abnormality is noted. Vascular/Lymphatic: Aortic atherosclerosis. No enlarged abdominal or pelvic lymph nodes. Reproductive: Uterus and bilateral adnexa are unremarkable. Other: No abdominal wall hernia or abnormality. No abdominopelvic ascites. Musculoskeletal: No acute or significant osseous findings. Review of the MIP images confirms the above findings. IMPRESSION: CTA of the chest: No evidence of pulmonary emboli. Large mildly cavitary infiltrate in superior segment of right lower lobe consistent with acute pneumonia. Some minimal infiltrate is noted in the left base as well. Small bleb with air-fluid level within is noted in the medial aspect of the left base. These findings are all new from the prior exam. Resolution of previously seen effusions. 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 CT of the abdomen and pelvis: Postsurgical changes consistent with colostomy. Area of decreased attenuation and enhancement within the lateral segment of the left lobe of the liver with a somewhat mass like appearance along the margin of the liver. Possible metastatic disease would deserve consideration. Status post right nephrectomy. Electronically Signed   By: Inez Catalina M.D.   On: 11/12/2021 19:33   DG Chest Portable 1 View  Result Date: 11/12/2021 CLINICAL DATA:  ng/ett placement EXAM: PORTABLE CHEST 1 VIEW COMPARISON:  Same day chest radiograph. FINDINGS: Masslike prominence of the right hilum persists. Cardiac silhouette is mildly enlarged. No consolidation. No visible pleural effusions or pneumothorax on this semi upright AP radiograph. IMPRESSION: 1. Masslike prominence of the right hilum persists. Recommend CT chest (preferably with contrast) when able to exclude adenopathy and/or mass. 2.  Endotracheal tube tip approximately 2.3 cm above the carina. 3. Gastric tube courses below the diaphragm with the tip outside the field of view. Electronically Signed   By: Margaretha Sheffield M.D.   On: 11/12/2021 18:27   DG Chest Port 1 View  Result Date: 11/12/2021 CLINICAL DATA:  Questionable sepsis.  Right-sided weakness. EXAM: PORTABLE CHEST 1 VIEW COMPARISON:  04/17/2020.  Chest CT 06/16/2021 FINDINGS: Mild cardiomegaly. Aortic atherosclerosis. No evidence of consolidation, collapse or effusion. Mild hilar prominence right more than left felt most likely secondary to the portable AP technique and poor inspiration. Additional chest radiographic follow-up suggested when the patient is able to have a standard two view exam to exclude right hilar prominence. IMPRESSION: Cardiomegaly.  Aortic atherosclerosis. Technical limitations of poor inspiration and portable AP technique. Recommend follow-up two-view chest radiography when able to exclude right hilar mass. Electronically Signed   By: Nelson Chimes M.D.   On: 11/12/2021 16:08   CT  HEAD CODE STROKE WO CONTRAST  Result Date: 11/12/2021 CLINICAL DATA:  Code stroke. Neuro deficit, acute, stroke suspected. EXAM: CT HEAD WITHOUT CONTRAST TECHNIQUE: Contiguous axial images were obtained from the base of the skull through the vertex without intravenous contrast. RADIATION DOSE REDUCTION: This exam was performed according to the departmental dose-optimization program which includes automated exposure control, adjustment of the mA and/or kV according to patient size and/or use of iterative reconstruction technique. COMPARISON:  04/17/2020 FINDINGS: Brain: The study suffers from significant motion degradation. No focal abnormality is seen affecting the brainstem or cerebellum. Cerebral hemispheres show an old lacunar infarction in the left basal ganglia an old left frontal cortical and subcortical infarction. No sign of acute infarction, mass lesion, hemorrhage,  hydrocephalus or extra-axial collection. Vascular: There is atherosclerotic calcification of the major vessels at the base of the brain. Skull: Negative, allowing for the motion degradation. Sinuses/Orbits: Clear/normal Other: None ASPECTS ([place] Stroke Program Early CT Score) - Ganglionic level infarction (caudate, lentiform nuclei, internal capsule, insula, M1-M3 cortex): 7 - Supraganglionic infarction (M4-M6 cortex): 3 Total score (0-10 with 10 being normal): 10 IMPRESSION: 1. Motion degraded study. No acute CT finding. Old left basal ganglia lacunar infarction. Old left frontal cortical and subcortical infarction. 2. ASPECTS is 10, allowing for motion. 3. These results were called by telephone at the time of interpretation on 11/12/2021 at 3:22 pm to provider MATTHEW TRIFAN , who verbally acknowledged these results. Electronically Signed   By: Nelson Chimes M.D.   On: 11/12/2021 15:27      Note I have personally reviewed images of the CT chest abdomen and pelvis.  I have spoken to her son Boone Master as well.  Thanks   LOS: 0 days   _0  E. Carlean Purl, MD, [facility] @  11/12/2021, 8:47 PM

## 2021-11-12 NOTE — Progress Notes (Signed)
eLink Physician-Brief Progress Note Patient Name: Kristen Murphy DOB: [birth year] MRN: [id number]   Date of Service  11/12/2021  HPI/Events of Note  37 F history of tobacco and polysubstance abuse, cervical cancer, s/p nephrectomy and colostomy, CAD s/p LAD stent 2020, DM (A1C 7.2,) hypertension, remote CVA, depression. Brought in due to altered sensorium and speech with flu like symptoms. PEA arrest on arrival to the ED with ROSC after 2 minutes. Tox screen positive for benzos. OGT inserted with bloody output, placed on protonix infusion and transfused 1 unit PRBC.  Chest CT with cavitary consolidation superior segment right lower lobe and bleb with air fluid level on left lung base.  Chronic occlusion left common carotid artery  Mass like appearance lateral segment of the left lobe of the liver  BP 128/95  HR 93  O2 94% temp 39 Sedated on Fentanyl 100 and Propofol 15 Increased work of breathing  eICU Interventions  Cardiac arrest, sepsis from necrotizing pneumonia, febrile on vancomycin, ceftriaxone and acyclovir as treating also for possible meningitis. Consider adding additional anaerobic coverage GI to scope tonight. Serial H/H Bedside CCM team currently evaluating and managing. TTM initiated     Intervention Category Evaluation Type: New Patient Evaluation  Judd Lien 11/12/2021, 7:47 PM

## 2021-11-12 NOTE — Consult Note (Signed)
NEUROLOGY CONSULTATION NOTE   Date of service: November 12, 2021 Patient Name: Kristen Murphy MRN:  [id number] DOB:  11/04/1965 Reason for consult: stroke code Requesting physician: Dr. Octaviano Glow _ _ _   _ __   _ __ _ _  __ __   _ __   __ _  History of Present Illness   This is a 56 year old woman with a history of cervical cancer, status post nephrectomy 2 months ago, colostomy, CAD, diabetes, depression, hypertension, remote history of stroke without residual deficits who is brought in by EMS for altered mental status.  Code stroke was activated in route.  Per family she has been having flulike symptoms for about a week.  Around noon today she developed unsteady gait and worsening confusion (LKW 1200).  She was completely nonfocal on my examination although she did have difficulty answering orientation questions and also had some confusion when following commands.  She was noted to have a temperature of 103 F with EMS as well as a blood sugar of 508.  Head CT showed no acute intracranial process (personal review).  TNK was not administered due to nonfocal exam and low suspicion of stroke, suspicion instead for encephalopathy secondary to sepsis.  Exam was not consistent with LVO therefore CTA head and neck was not performed as part of the stroke code.   ROS   UTA 2/2 mental status  Past History   I have reviewed the following:  Past Medical History:  Diagnosis Date   Anemia    Anxiety    Arthritis    Asthma    Chronic kidney disease    Coronary artery disease    Depression    Diabetes mellitus without complication ([place])    Dyspnea    Headache    Heart murmur    Hypertension    Seizures ([place])    Stroke [facility])    Past Surgical History:  Procedure Laterality Date   CARDIAC CATHETERIZATION     CORONARY STENT INTERVENTION N/A 02/19/2019   Procedure: CORONARY STENT INTERVENTION;  Surgeon: Lorretta Harp, MD;  Location: [place] [facility];  Service: Cardiovascular;   Laterality: N/A;   LEFT HEART CATH AND CORONARY ANGIOGRAPHY N/A 02/19/2019   Procedure: LEFT HEART CATH AND CORONARY ANGIOGRAPHY;  Surgeon: Lorretta Harp, MD;  Location: [place] [facility];  Service: Cardiovascular;  Laterality: N/A;   TONSILLECTOMY     Family History  Problem Relation Age of Onset   Hypertension Mother    Hyperlipidemia Mother    Hypertension Brother    Diabetes Maternal Uncle    Social History   Socioeconomic History   Marital status: Married    Spouse name: Not on file   Number of children: Not on file   Years of education: Not on file   Highest education level: Not on file  Occupational History   Not on file  Tobacco Use   Smoking status: Every Day    Packs/day: 3.00    Years: 38.00    Pack years: 114.00    Types: Cigarettes   Smokeless tobacco: Never  Vaping Use   Vaping Use: Some days  Substance and Sexual Activity   Alcohol use: Not Currently   Drug use: Not Currently    Types: Cocaine, Benzodiazepines, Amphetamines, "Crack" cocaine, Marijuana, Opium, Methylphenidate, Heroin    Comment: recovering addict   Sexual activity: Not Currently  Other Topics Concern   Not on file  Social History Narrative   Not  on file   Social Determinants of Health   Financial Resource Strain: Not on file  Food Insecurity: Not on file  Transportation Needs: Not on file  Physical Activity: Not on file  Stress: Not on file  Social Connections: Not on file   Allergies  Allergen Reactions   Glimepiride Anaphylaxis    Has sulfa in it per pharmacy   Sulfa Antibiotics Anaphylaxis and Hives   Effexor [Venlafaxine] Hives    Medications   (Not in a hospital admission)     Current Facility-Administered Medications:    cefTRIAXone (ROCEPHIN) 2 g in sodium chloride 0.9 % 100 mL IVPB, 2 g, Intravenous, Once, Trifan, Carola Rhine, MD   sodium chloride flush (NS) 0.9 % injection 3 mL, 3 mL, Intravenous, Once, Trifan, Carola Rhine, MD  Current Outpatient Medications:     albuterol (VENTOLIN HFA) 108 (90 Base) MCG/ACT inhaler, Inhale 2 puffs into the lungs every 6 (six) hours as needed for wheezing or shortness of breath., Disp: , Rfl:    amLODipine (NORVASC) 10 MG tablet, TAKE 1 TABLET BY MOUTH ONCE DAILY, Disp: 90 tablet, Rfl: 0   aspirin 81 MG EC tablet, Take 1 tablet (81 mg total) by mouth daily., Disp: 30 tablet, Rfl: 6   atorvastatin (LIPITOR) 80 MG tablet, Take 1 tablet (80 mg total) by mouth daily at 6 PM., Disp: 90 tablet, Rfl: 2   dapagliflozin propanediol (FARXIGA) 10 MG TABS tablet, Take 10 mg by mouth daily., Disp: , Rfl:    gabapentin (NEURONTIN) 400 MG capsule, Take 400 mg by mouth 3 (three) times daily., Disp: , Rfl:    ibuprofen (ADVIL) 800 MG tablet, Take 1 tablet by mouth every 6 (six) hours as needed for fever, headache, mild pain, moderate pain or cramping., Disp: , Rfl:    lisinopril (ZESTRIL) 40 MG tablet, Take 40 mg by mouth daily., Disp: , Rfl:    metFORMIN (GLUCOPHAGE) 1000 MG tablet, Take 1 tablet (1,000 mg total) by mouth 2 (two) times daily with a meal., Disp: 60 tablet, Rfl: 2   metoprolol tartrate (LOPRESSOR) 50 MG tablet, Take 1 tablet (50 mg total) by mouth 2 (two) times daily., Disp: 60 tablet, Rfl: 6   nitroGLYCERIN (NITROSTAT) 0.4 MG SL tablet, Place 1 tablet (0.4 mg total) under the tongue every 5 (five) minutes x 3 doses as needed for chest pain., Disp: 25 tablet, Rfl: 2   ondansetron (ZOFRAN-ODT) 4 MG disintegrating tablet, Take 1 tablet by mouth every 8 (eight) hours as needed for nausea, vomiting or refractory nausea / vomiting., Disp: , Rfl:   Vitals   Vitals:   11/12/21 1625 11/12/21 1626 11/12/21 1630 11/12/21 1700  BP:  (!) 144/67 (!) 153/76 (!) 166/64  Pulse: (!) 112 (!) 113 (!) 112 (!) 117  Resp: 15 (!) 31 13 (!) 30  Temp:      TempSrc:      SpO2: 98% 98% 100% 100%  Weight:      Height:         Body mass index is 30.48 kg/m.  Physical Exam   Physical Exam Gen: alert, oriented to self only,  inconsistently follows simple commands HEENT: Atraumatic, normocephalic;mucous membranes moist; oropharynx clear, tongue without atrophy or fasciculations. Neck: Supple, trachea midline. Resp: CTAB, no w/r/r CV: RRR, no m/g/r; nml S1 and S2. 2+ symmetric peripheral pulses. Abd: soft/NT/ND; nabs x 4 quad; colostomy bag in place Extrem: Nml bulk; no cyanosis, clubbing, or edema.  Neuro: *MS: alert, oriented to self only,  inconsistently follows simple commands *Speech: mild dsayrthria *CN:    I: Deferred   II,III: PERRLA, blinks to threat bilat, optic discs unable to be visualized 2/2 pupillary constriction   III,IV,VI: EOMI w/o nystagmus, no ptosis   V: Sensation intact from V1 to V3 to LT   VII: Eyelid closure was full.  Smile symmetric.   VIII: Hearing intact to voice   IX,X: Voice normal, palate elevates symmetrically    XI: SCM/trap 5/5 bilat   XII: Tongue protrudes midline, no atrophy or fasciculations  *Motor:   Normal bulk.  No tremor, rigidity or bradykinesia. 5/5 strength in all extremities. *Sensory: SILT. No double-simultaneous extinction.  *Coordination:  UTA FNF 2/2  *Reflexes:  1+ and symmetric throughout without clonus; toes down-going bilat *Gait: deferred  NIHSS  1a Level of Conscious.: 0 1b LOC Questions: 2 1c LOC Commands: 1 2 Best Gaze: 0 3 Visual: 0 4 Facial Palsy: 0 5a Motor Arm - left: 0 5b Motor Arm - Right: 0 6a Motor Leg - Left: 0 6b Motor Leg - Right: 0 7 Limb Ataxia: 0 8 Sensory: 0 9 Best Language: 0 10 Dysarthria: 1 11 Extinct. and Inatten.: 0  TOTAL: 4   Premorbid mRS = 0   Labs   CBC:  Recent Labs  Lab 11/12/21 1508 11/12/21 1514  WBC 18.6*  --   NEUTROABS 16.7*  --   HGB 8.9* 9.9*  HCT 28.9* 29.0*  MCV 87.0  --   PLT 251  --     Basic Metabolic Panel:  Lab Results  Component Value Date   NA 129 (L) 11/12/2021   K 3.1 (L) 11/12/2021   CO2 20 (L) 11/12/2021   GLUCOSE 412 (H) 11/12/2021   BUN 15 11/12/2021    CREATININE 1.10 (H) 11/12/2021   CALCIUM 7.9 (L) 11/12/2021   GFRNONAA 50 (L) 11/12/2021   GFRAA >60 02/20/2019   Lipid Panel:  Lab Results  Component Value Date   LDLCALC UNABLE TO CALCULATE IF TRIGLYCERIDE OVER 400 mg/dL 02/19/2019   HgbA1c:  Lab Results  Component Value Date   HGBA1C 9.2 (H) 02/19/2019   Urine Drug Screen: No results found for: LABOPIA, COCAINSCRNUR, LABBENZ, AMPHETMU, THCU, LABBARB  Alcohol Level     Component Value Date/Time   ETH <10 11/12/2021 1521     Impression   This is a 56 year old woman with a history of cervical cancer, status post nephrectomy 2 months ago, colostomy, CAD, diabetes, depression, hypertension, remote history of stroke without residual deficits who is brought in by EMS for altered mental status.  CT head NAICP and her exam was completely nonfocal so my suspicion for stroke is low. Etiology of encephalopathy favored to be sepsis. If source not identified she will need LP in ED prior to admission.   Recommendations   - If source of infection not identified she will need LP in ED prior to admission - No further stroke workup indicated at this time - Otherwise neurology will be available prn for questions going forward ______________________________________________________________________   Thank you for the opportunity to take part in the care of this patient. If you have any further questions, please contact the neurology consultation attending.  Signed,  Su Monks, MD [facility] [phone]  If 7pm- 7am, please page neurology on call as listed in [place].  Addendum 1830: Patient developed bradycardia and began vomiting blood and subsequently went into cardiac arrest. ROSC achieved with 2 rounds compressions and epi x1. Will rescan head CT to  make sure she did not have anything causing intracranial pressure leading to bradycardia.  Su Monks, MD [facility] [phone]  If 7pm- 7am, please  page neurology on call as listed in [place].

## 2021-11-12 NOTE — Op Note (Signed)
[facility] Patient Name: Kristen Murphy Procedure Date : 11/12/2021 MRN: [id number] Attending MD: Gatha Mayer , MD Date of Birth: [birth year] CSN: [id number] Age: 56 Admit Type: Inpatient Procedure:                Upper GI endoscopy Indications:              Hematemesis, Suspected upper gastrointestinal                            bleeding Providers:                Gatha Mayer, MD, Elmer Ramp. Tilden Dome, RN, Benetta Spar, Technician Referring MD:              Medicines:                In ICU, intubated and sedated per CCM Complications:            No immediate complications. Estimated Blood Loss:     Estimated blood loss: none. Estimated blood loss:                            none. Procedure:                Pre-Anesthesia Assessment:                           - Prior to the procedure, a History and Physical                            was performed, and patient medications and                            allergies were reviewed. The patient's tolerance of                            previous anesthesia was also reviewed. The risks                            and benefits of the procedure and the sedation                            options and risks were discussed with the patient.                            All questions were answered, and informed consent                            was obtained. Prior Anticoagulants: The patient has                            taken no previous anticoagulant or antiplatelet                            agents. ASA  Grade Assessment: IV - A patient with                            severe systemic disease that is a constant threat                            to life. After reviewing the risks and benefits,                            the patient was deemed in satisfactory condition to                            undergo the procedure.                           After obtaining informed consent, the endoscope was                             passed under direct vision. Throughout the                            procedure, the patient's blood pressure, pulse, and                            oxygen saturations were monitored continuously. The                            [id number]-H190 ([id number]) Olympus endoscope was introduced                            through the mouth, and advanced to the second part                            of duodenum. The upper GI endoscopy was                            accomplished without difficulty. The patient                            tolerated the procedure well. Scope In: Scope Out: Findings:      Blood in clots were found in the gastric fundus and in the gastric body.      A few localized small erosions with no stigmata of recent bleeding were       found in the gastric body. OG tube trauma      The cardia and gastric fundus were normal on retroflexion.      Patchy mucosal changes characterized by Mottled muocsal features were       found in the gastric antrum.      The exam was otherwise without abnormality. Impression:               - Blood in clots in the gastric fundus and in the  gastric body. Several - easily removed - no                            bleeding lesions/source found                           - Erosive gastropathy with no stigmata of recent                            bleeding. OG TUBE TRAUMA IN BODY                           - Mottled muocsal features mucosa in the antrum.                           - The examination was otherwise normal.                           - No specimens collected. Recommendation:           - Remain ICU for ongoing care. pantoprazole 40 mg                            IV q 12 hrs - observe for more bleeding. Story is                            odd - one thing to keep in mind would be                            Dieaulafoy's lesion Procedure Code(s):        --- Professional ---                           [phone],  Esophagogastroduodenoscopy, flexible,                            transoral; diagnostic, including collection of                            specimen(s) by brushing or washing, when performed                            (separate procedure) Diagnosis Code(s):        --- Professional ---                           K92.2, Gastrointestinal hemorrhage, unspecified                           K31.89, Other diseases of stomach and duodenum                           K92.0, Hematemesis CPT copyright 2019 [employer]. All rights reserved. The codes documented in this report are preliminary and upon coder review may  be revised to meet current compliance requirements. Gatha Mayer, MD 11/12/2021  10:25:08 PM This report has been signed electronically. Number of Addenda: 0

## 2021-11-12 NOTE — ED Notes (Signed)
Will give bedside report to [place], heading directly there from CT

## 2021-11-12 NOTE — ED Notes (Addendum)
While obtaining trop, pts stats dropped and blood came out of mouth after suctioning. Pt placed on NRB, pulses present. MD immediately to bedside.

## 2021-11-12 NOTE — H&P (Signed)
NAME:  Kristen Murphy, MRN:  [id number], DOB:  05/21/1966, LOS: 0 ADMISSION DATE:  11/12/2021, CONSULTATION DATE: 11/12/2021 REFERRING MD: Dr. Langston Masker, CHIEF COMPLAINT: PEA arrest  History of Present Illness:   56 year old woman with a history of tobacco use, cervical cancer ([facility]), post nephrectomy and colostomy, CAD with LAD stent 01/2019, diabetes, depression, hypertension, remote CVA without known deficits.  Also with a history of substance abuse.  Brought in by her son to the ED 2/19 with fever, altered mental status and slurred speech. Apparently has had a flulike illness for a week, was found confused on 2/19.  Some suspicion by son that she may have been taking more prescription narcotics, also taking many NSAIDs per his report.  She was disoriented in the ED, febrile 103F, hyperglycemic. In the ED she apparently experienced acute hypoxemia, had some blood from her mouth, question emesis versus hemoptysis.  Devolved to PEA and required emergent ET intubation and CPR.  ROSC after 2 minutes.  OG tube placed with bright red blood obtained.  Chest x-ray with mild right hilar prominence, more notable on postintubation film.  No infiltrates or effusions She has received empiric antibiotics, is receiving 1 unit PRBC, is about to start Protonix infusion. Hemodynamically stabilized, mechanically ventilated.   Pertinent  Medical History   Past Medical History:  Diagnosis Date   Anemia    Anxiety    Arthritis    Asthma    Chronic kidney disease    Coronary artery disease    Depression    Diabetes mellitus without complication (HCC)    Dyspnea    Headache    Heart murmur    Hypertension    Seizures ([place])    Stroke ([place])     Significant Hospital Events: Including procedures, antibiotic start and stop dates in addition to other pertinent events   Head CT 2/19 > no acute CT findings, old left basal ganglia lacunar, old left frontal cortical and subcortical infarct   Interim History /  Subjective:   Stabilizing post-CPR BP > 200/100  Objective   Blood pressure (!) 158/80, pulse (!) 121, temperature (!) 101.4 F (38.6 C), temperature source Rectal, resp. rate (!) 22, height 5\' 2"  (1.575 m), weight 75.6 kg, SpO2 100 %.    FiO2 (%):  [100 %] 100 %   Intake/Output Summary (Last 24 hours) at 11/12/2021 1813 Last data filed at 11/12/2021 1716 Gross per 24 hour  Intake 1600 ml  Output --  Net 1600 ml   Filed Weights   11/12/21 1536  Weight: 75.6 kg    Examination: General: Critically ill-appearing woman, intubated and sedated HENT: ET tube in place, some bright red blood in the mouth and also being suctioned from OG tube.  Pupils pinpoint Lungs: Coarse bilaterally, distant without wheezing Cardiovascular: Regular, tachycardic, distant, no murmur Abdomen: Soft, nondistended with positive bowel sounds Extremities: No edema Neuro: Sedated, completely unresponsive to pain or voice.  No spontaneous movement.  Does not follow commands (immediately after sedation)  Resolved Hospital Problem list     Assessment & Plan:   Acute cardiopulmonary arrest, PEA arrest -Defer TTM, CPR time 2 minutes, initiated immediately -Follow for any evidence of endorgan injury  Acute respiratory failure with hypoxemia -PRVC 8 cc/kg -Post intubation ABG -Follow chest x-ray -VAP prevention orders -PAD protocol for sedation: Propofol, fentanyl  Acute GI bleed, hemoglobin 8.9 > 9.9.  History of NSAID abuse -Follow serial CBC -Pantoprazole infusion initiated -No history of alcohol, octreotide deferred -GI consultation  this evening  Encephalopathy, cause unclear.  Head CT reassuring.  Consider infection either UTI, meningitis, other.  Consider also substances as son concerned about possible narcotic use.  UDS only positive for benzo -PAD protocol for sedation -Appreciate neurology evaluation.  No evidence CVA but clinical picture concerning for possible meningitis especially given  high fever.  Favor treatment with ceftriaxone, vancomycin, ampicillin, acyclovir. -LP when stable to do so -Correct metabolic abnormalities and treat underlying contributors  Severe sepsis.  Again source unclear.  Consider meningitis, UTI with positive UA -Empiric antibiotics as above -Follow culture data -LP when feasible to do so  Hypertension History of CAD -Holding amlodipine, metoprolol, lisinopril until stabilizing -Hold aspirin in setting of GI blood loss  Prominent right hilum on chest x-ray -Plan for CT scan of the chest when stable to undergo  Diabetes with hyperglycemia -CBGs, SSI if > 180 -Home metformin on hold -Home Farxiga on hold  Hyponatremia, hypocalcemia, hypokalemia -Replace potassium, calcium -Follow BMP  Nutrition -N.p.o., no tube feeds for now pending GI evaluation  Best Practice (right click and "Reselect all SmartList Selections" daily)   Diet/type: NPO DVT prophylaxis: SCD GI prophylaxis: PPI Lines: N/A Foley:  N/A Code Status:  full code Last date of multidisciplinary goals of care discussion [Dr Trifan in ED calling to notify pt's son of acute events 2/19]  Labs   CBC: Recent Labs  Lab 11/12/21 1508 11/12/21 1514 11/12/21 1759  WBC 18.6*  --   --   NEUTROABS 16.7*  --   --   HGB 8.9* 9.9* 9.9*  HCT 28.9* 29.0* 29.0*  MCV 87.0  --   --   PLT 251  --   --     Basic abnormalities panel: Recent Labs  Lab 11/12/21 1508 11/12/21 1514 11/12/21 1759  NA 126* 129* 134*  K 3.0* 3.1* 3.3*  CL 93* 97*  --   CO2 20*  --   --   GLUCOSE 417* 412*  --   BUN 16 15  --   CREATININE 1.26* 1.10*  --   CALCIUM 7.9*  --   --    GFR: Estimated Creatinine Clearance: 54.4 mL/min (A) (by C-G formula based on SCr of 1.1 mg/dL (H)). Recent Labs  Lab 11/12/21 1508 11/12/21 1518  WBC 18.6*  --   LATICACIDVEN  --  1.5    Liver Function Tests: Recent Labs  Lab 11/12/21 1508  AST 12*  ALT 12  ALKPHOS 58  BILITOT 0.3  PROT 7.0  ALBUMIN  2.5*   No results for input(s): LIPASE, AMYLASE in the last 168 hours. No results for input(s): AMMONIA in the last 168 hours.  ABG    Component Value Date/Time   HCO3 23.7 11/12/2021 1759   TCO2 25 11/12/2021 1759   ACIDBASEDEF 3.0 (H) 11/12/2021 1759   O2SAT 67 11/12/2021 1759     Coagulation Profile: Recent Labs  Lab 11/12/21 1508  INR 1.2    Cardiac Enzymes: No results for input(s): CKTOTAL, CKMB, CKMBINDEX, TROPONINI in the last 168 hours.  HbA1C: Hgb A1c MFr Bld  Date/Time Value Ref Range Status  02/19/2019 03:32 AM 9.2 (H) 4.8 - 5.6 % Final    Comment:    (NOTE) Pre diabetes:          5.7%-6.4% Diabetes:              >6.4% Glycemic control for   <7.0% adults with diabetes   02/20/2017 12:44 PM 6.8 (H) 4.8 - 5.6 %  Final    Comment:    (NOTE)         Pre-diabetes: 5.7 - 6.4         Diabetes: >6.4         Glycemic control for adults with diabetes: <7.0     CBG: No results for input(s): GLUCAP in the last 168 hours.  Review of Systems:   Unobtainable from patient  Past Medical History:  She,  has a past medical history of Anemia, Anxiety, Arthritis, Asthma, Chronic kidney disease, Coronary artery disease, Depression, Diabetes mellitus without complication ([place]), Dyspnea, Headache, Heart murmur, Hypertension, Seizures ([place]), and Stroke ([place]).   Surgical History:   Past Surgical History:  Procedure Laterality Date   CARDIAC CATHETERIZATION     CORONARY STENT INTERVENTION N/A 02/19/2019   Procedure: CORONARY STENT INTERVENTION;  Surgeon: Lorretta Harp, MD;  Location: [facility];  Service: Cardiovascular;  Laterality: N/A;   LEFT HEART CATH AND CORONARY ANGIOGRAPHY N/A 02/19/2019   Procedure: LEFT HEART CATH AND CORONARY ANGIOGRAPHY;  Surgeon: Lorretta Harp, MD;  Location: [facility];  Service: Cardiovascular;  Laterality: N/A;   TONSILLECTOMY       Social History:   reports that she has been smoking cigarettes. She has a 114.00  pack-year smoking history. She has never used smokeless tobacco. She reports that she does not currently use alcohol. She reports that she does not currently use drugs after having used the following drugs: Cocaine, Benzodiazepines, Amphetamines, "Crack" cocaine, Marijuana, Opium, Methylphenidate, and Heroin.   Family History:  Her family history includes Diabetes in her maternal uncle; Hyperlipidemia in her mother; Hypertension in her brother and mother.   Allergies Allergies  Allergen Reactions   Glimepiride Anaphylaxis    Has sulfa in it per pharmacy   Sulfa Antibiotics Anaphylaxis and Hives   Effexor [Venlafaxine] Hives     Home Medications  Prior to Admission medications   Medication Sig Start Date End Date Taking? Authorizing Provider  albuterol (VENTOLIN HFA) 108 (90 Base) MCG/ACT inhaler Inhale 2 puffs into the lungs every 6 (six) hours as needed for wheezing or shortness of breath.    [provider]  amLODipine (NORVASC) 10 MG tablet TAKE 1 TABLET BY MOUTH ONCE DAILY 04/12/20   Tobb, Kardie, DO  aspirin 81 MG EC tablet Take 1 tablet (81 mg total) by mouth daily. 02/20/19   Cheryln Manly, NP  atorvastatin (LIPITOR) 80 MG tablet Take 1 tablet (80 mg total) by mouth daily at 6 PM. 02/20/19   Cheryln Manly, NP  dapagliflozin propanediol (FARXIGA) 10 MG TABS tablet Take 10 mg by mouth daily.    [provider]  gabapentin (NEURONTIN) 400 MG capsule Take 400 mg by mouth 3 (three) times daily.    [provider]  ibuprofen (ADVIL) 800 MG tablet Take 1 tablet by mouth every 6 (six) hours as needed for fever, headache, mild pain, moderate pain or cramping. 02/04/20   [provider]  lisinopril (ZESTRIL) 40 MG tablet Take 40 mg by mouth daily.    [provider]  metFORMIN (GLUCOPHAGE) 1000 MG tablet Take 1 tablet (1,000 mg total) by mouth 2 (two) times daily with a meal. 02/20/19   Cheryln Manly, NP  metoprolol tartrate (LOPRESSOR) 50  MG tablet Take 1 tablet (50 mg total) by mouth 2 (two) times daily. 02/20/19   Cheryln Manly, NP  nitroGLYCERIN (NITROSTAT) 0.4 MG SL tablet Place 1 tablet (0.4 mg total)  under the tongue every 5 (five) minutes x 3 doses as needed for chest pain. 02/20/19   Cheryln Manly, NP  ondansetron (ZOFRAN-ODT) 4 MG disintegrating tablet Take 1 tablet by mouth every 8 (eight) hours as needed for nausea, vomiting or refractory nausea / vomiting.    [provider]     Critical care time: 45 minutes     Baltazar Apo, MD, PhD 11/12/2021, 6:52 PM [facility] [phone] or if no answer before 7:00PM call [phone] For any issues after 7:00PM please call eLink [phone]

## 2021-11-12 NOTE — ED Triage Notes (Signed)
Pt BIBA from home. Pt was having right sided weakness, absent gaze, and generalized confusion. Code Stroke called en route by EMS. However, pt also had 102 fever with EMS and 505 FSBG.

## 2021-11-12 NOTE — Procedures (Signed)
Name:  Kristen Murphy MRN:  [id number] DOB:  26-Sep-1965  PROCEDURE NOTE  Procedure:  Central venous catheter placement.  Indications:  Need for intravenous access and hemodynamic monitoring.  Consent:  Consent was implied due to the emergency nature of the procedure.  Anesthesia:  A total of 10 mL of 1% Lidocaine was used for local infiltration anesthesia.  Procedure summary:  Appropriate equipment was assembled.  The patient was identified as [name] and safety timeout was performed. The patient was placed in Trendelenburg position.  Sterile technique was used. The patient's left anterior chest wall was prepped using chlorhexidine / alcohol scrub and the field was draped in usual sterile fashion with full body drape. After the adequate anesthesia was achieved, the left subclavian vein was cannulated with the introducer needle without difficulty. A guide wire was advanced through the introducer needle, which was then withdrawn. A small skin incision was made at the point of wire entry, the dilator was inserted over the guide wire and appropriate dilation was obtained. The dilator was removed and 7 French triple-lumen catheter was advanced over the guide wire, which was then removed.  All ports were aspirated and flushed with normal saline without difficulty. The catheter was secured into place at 18 cm. Antibiotic patch was placed and sterile dressing was applied. Post-procedure chest x-ray was ordered.  Complications:  No immediate complications were noted.  Hemodynamic parameters and oxygenation remained stable throughout the procedure.  Estimated blood loss:  Less then 5 mL.  Etheleen Nicks, MD

## 2021-11-12 NOTE — Progress Notes (Signed)
See H and P was done pre-procedure

## 2021-11-12 NOTE — ED Provider Notes (Signed)
[name] Provider Note   CSN: [id number] Arrival date & time: 11/12/21  1502     History  CC: Fever, confusion   Kristen Murphy is a 56 y.o. female presenting by EMS with concern for fever, AMS.  Family reported patient had slurred speech beginning at noon.  Her son Boone Master whom she lives with, reports the patient has had flu-like symptoms for a week.  This morning he found her hunched over the bed, confused, didn't know their names.  He reports patient has a history of a "drug problem" and spent lots of cash this week, he found text messages on her phone regarding discussion for trading pills, like oxycodone, suboxone with other people.  She is on gabapentin 800 mg TID.  He does not feel that she drinks etoh daily or has had drinking problems.  Pt hx of diabetes, CAD s/p LAD stent May 2020, HTN, anxiety, smoker Son reports she's not on insulin for diabetes  She finished treatment x 6 months for cervical cancer at [facility] Hx of nephrectomy  Hx of colostomy   HPI     Home Medications Prior to Admission medications   Medication Sig Start Date End Date Taking? Authorizing Provider  albuterol (VENTOLIN HFA) 108 (90 Base) MCG/ACT inhaler Inhale 2 puffs into the lungs every 6 (six) hours as needed for wheezing or shortness of breath.    [provider]  amLODipine (NORVASC) 10 MG tablet TAKE 1 TABLET BY MOUTH ONCE DAILY 04/12/20   Tobb, Kardie, DO  aspirin 81 MG EC tablet Take 1 tablet (81 mg total) by mouth daily. 02/20/19   Cheryln Manly, NP  atorvastatin (LIPITOR) 80 MG tablet Take 1 tablet (80 mg total) by mouth daily at 6 PM. 02/20/19   Cheryln Manly, NP  dapagliflozin propanediol (FARXIGA) 10 MG TABS tablet Take 10 mg by mouth daily.    [provider]  gabapentin (NEURONTIN) 400 MG capsule Take 400 mg by mouth 3 (three) times daily.    [provider]  ibuprofen (ADVIL) 800 MG tablet Take 1  tablet by mouth every 6 (six) hours as needed for fever, headache, mild pain, moderate pain or cramping. 02/04/20   [provider]  lisinopril (ZESTRIL) 40 MG tablet Take 40 mg by mouth daily.    [provider]  metFORMIN (GLUCOPHAGE) 1000 MG tablet Take 1 tablet (1,000 mg total) by mouth 2 (two) times daily with a meal. 02/20/19   Cheryln Manly, NP  metoprolol tartrate (LOPRESSOR) 50 MG tablet Take 1 tablet (50 mg total) by mouth 2 (two) times daily. 02/20/19   Cheryln Manly, NP  nitroGLYCERIN (NITROSTAT) 0.4 MG SL tablet Place 1 tablet (0.4 mg total) under the tongue every 5 (five) minutes x 3 doses as needed for chest pain. 02/20/19   Cheryln Manly, NP  ondansetron (ZOFRAN-ODT) 4 MG disintegrating tablet Take 1 tablet by mouth every 8 (eight) hours as needed for nausea, vomiting or refractory nausea / vomiting.    [provider]      Allergies    Glimepiride, Sulfa antibiotics, and Effexor [venlafaxine]    Review of Systems   Review of Systems  Physical Exam Updated Vital Signs There were no vitals taken for this visit. Physical Exam Constitutional:      General: She is not in acute distress. HENT:     Head: Normocephalic and atraumatic.  Eyes:     Conjunctiva/sclera: Conjunctivae normal.  Pupils: Pupils are equal, round, and reactive to light.  Cardiovascular:     Rate and Rhythm: Normal rate and regular rhythm.  Pulmonary:     Effort: Pulmonary effort is normal. No respiratory distress.  Abdominal:     General: There is no distension.     Tenderness: There is no abdominal tenderness.  Skin:    General: Skin is warm and dry.  Neurological:     General: No focal deficit present.     Mental Status: She is alert. Mental status is at baseline.     Comments: Slurred speech, intermittently follows commands  Psychiatric:        Mood and Affect: Mood normal.        Behavior: Behavior normal.    ED Results / Procedures / Treatments    Labs (all labs ordered are listed, but only abnormal results are displayed) Labs Reviewed  PROTIME-INR  APTT  CBC  DIFFERENTIAL  COMPREHENSIVE METABOLIC PANEL  I-STAT CHEM 8, ED  CBG MONITORING, ED  I-STAT BETA HCG BLOOD, ED ([facility], [facility], [facility] ONLY)    EKG None  Radiology No results found.  Procedures .Critical Care Performed by: Wyvonnia Dusky, MD Authorized by: Wyvonnia Dusky, MD   Critical care provider statement:    Critical care time (minutes):  68   Critical care time was exclusive of:  Separately billable procedures and treating other patients   Critical care was necessary to treat or prevent imminent or life-threatening deterioration of the following conditions:  Circulatory failure and shock   Critical care was time spent personally by me on the following activities:  Ordering and performing treatments and interventions, ordering and review of laboratory studies, ordering and review of radiographic studies, pulse oximetry, review of old charts, examination of patient and evaluation of patient's response to treatment   Care discussed with: admitting provider   Comments:     Emergent blood transfusion; CPR resuscitation; discussion with family and consultants; ventilator management; frequent bedside reassessment Procedure Name: Intubation Date/Time: 11/13/2021 1:57 PM Performed by: Wyvonnia Dusky, MD Pre-anesthesia Checklist: Patient identified, Patient being monitored, Emergency Drugs available, Timeout performed and Suction available Oxygen Delivery Method: Non-rebreather mask Preoxygenation: Pre-oxygenation with 100% oxygen Induction Type: Rapid sequence Ventilation: Mask ventilation without difficulty Laryngoscope Size: Glidescope and 3 Tube size: 7.5 mm Number of attempts: 1 Airway Equipment and Method: Video-laryngoscopy Placement Confirmation: ETT inserted through vocal cords under direct vision, CO2 detector and Breath sounds checked- equal and  bilateral Secured at: 25 cm Tube secured with: ETT holder Dental Injury: Bloody posterior oropharynx        Medications Ordered in ED Medications  sodium chloride flush (NS) 0.9 % injection 3 mL (has no administration in time range)    ED Course/ Medical Decision Making/ A&P Clinical Course as of 11/13/21 1356  Sun Nov 12, 2021  1553 WBC(!): 18.6 [MT]  1553 EKG per my interpretation shows sinus rhythm with prolonged QT. [MT]  1554 CT scan of the brain was somewhat limited by motion but otherwise no evident infarct.  I do agree with the neurologist more likely related to an infectious or metabolic encephalopathy at this time. [MT]  1557 Rectal temp 101.32F.  Son reports patient given 1000 mg tylenol immediately prior to coming to hospital - will hold here [MT]  1559 Glucose 417, no anion gap [MT]  1614 Verbal order for fentanyl does not clear whether the patient may be in pain.  She does take longstanding pain medication.  She would not verbalize for me, is continuously sitting up in bed and trying to climb over the railing [MT]  1615 Patient continues to be noncompliant, pulling at lines, not listening to commands, trying to crawl out of bed.  Have asked for 2 mg of IV Haldol to be given for sedation [MT]  1656 Additional sedation medications ordered, 25 mg benadryl and 2 mg versed, patient is quite encephalopathic [MT]  1656 Lactate wnl, awaiting UA and Covid  [MT]  1821 Pt stable, intubated - BP 341 systolic, HR 937'T [MT]  0240 Critical care consulted [MT]  1822 I was summoned to the room as the patient had become unresponsive, subsequently had a large episode of hematemesis or hemoptysis, blood in the oropharynx.  We immediately proceeded to prepare for intubation, and this time she became bradycardic with heart rate into the 40s, blood pressure began to soften, and then there was no palpable pulse.  CPR was performed for approximately 2 minutes, with 1 epinephrine given.  Sodium  bicarb was also given.  Immediately afterwards the patient had ROSC with a bounding pulse and elevated and stable blood pressure.  IV fentanyl was ordered for sedation infusion.  She had received etomidate only for RSI.   [MT]  1823 Initial suspicion was for possible GI bleed, and 2 units of emergent blood were ordered from the bank and began on the patient, along with IV fluids.  Subsequent i-STAT hemoglobin showed hemoglobin stable and in fact somewhat improved to 9.9.  There was a small amount of blood aspirated from the stomach with OG tube placement.  This time does not clear whether this may be GI bleed or hemoptysis, and differential would include PE.  I would not initiate heparin until we have a clear source for her bleeding.  She will need CT head repeat, as well as CT PE scan, and may benefit from CT abdomen as well.  Critical care team on their way. [MT]  9735 MCH: 26.8 [MT]  17 Son updated by phone regarding critical condition, plan for ICU admission.  Dr Lamonte Sakai ICU attending present to admit - requested I consult GI for upper bleed, which I will do.  OG tube now extracting large volume blood [MT]  1840 April 2022 [facility]GD at [facility] report shows: Findings  Single small, superficial ulcer in the GE junction  The stomach and duodenum appeared normal.  Small sliding hiatal hernia (type I hiatal hernia) without Lysbeth Galas lesions  present    Impression  Small 31mm ulcer at distal esophagus  Hiatal hernia   [MT]  1845 Dr Carlean Purl GI consulted, will see patient [MT]    Clinical Course User Index [MT] Kaymon Denomme, Carola Rhine, MD                           Medical Decision Making Amount and/or Complexity of Data Reviewed Labs: ordered. Decision-making details documented in ED Course. Radiology: ordered.  Risk OTC drugs. Prescription drug management. Decision regarding hospitalization.   This patient presents to the ED with concern for fever, flu-like symptoms, AMS. This involves an  extensive number of treatment options, and is a complaint that carries with it a high risk of complications and morbidity.  The differential diagnosis includes infection vs atypical ACS vs CVA vs polypharmacy vs metabolic encephalopathy vs other  Co-morbidities that complicate the patient evaluation: hx of polysubstance abuse reported by son, with concern for possible ingestion of illicit drugs or excessive  prescribed home drugs  Additional history obtained from EMS, patient's son by phone   I ordered and personally interpreted labs.  The pertinent results per my indepden interpretation include:  CT, DG chest - initial CTH without evidence of acute infarct; xray chest without PTX, limited view, questionable mass in right lung;  Postintubation xray showing ETT 2.3 cm above carina Repeat CTH, CT PE, and CT abdomen ordered at the time of ICU admission in discussion with specialists (ICU team, neurology) - results pending at the time of admssion   The patient was maintained on a cardiac monitor.  I personally viewed and interpreted the cardiac monitored which showed an underlying rhythm of: sinus rhythm / sinus bradycardia  Per my interpretation the patient's ECG shows sinus rhythm without acute ischemic findings  I ordered medication including IV fluids, IV vanco + rocephin for sepsis (UA shows possible urinary source; questionable PNA on xray imaging); 2 units PRBC emergent transfusion for initial concern for GI hemorrhage; epi + bicarb given during 2 minute PEA arrest  Test Considered:  - Consideration of LP for meningitis rule out initially; unable to perform immediate LP due to patient's encephalopathy and high-risk for injury; subsequent sources of fever/confusion (UA, PNA) and GI bleed lowered suspicion for meningitis, but will defer further w/u to inpatient team.  Patient was treated empirically with rocephin for primary UTI but  thiswill provide cross-coverage for CNS infection if needed.  I  requested consultation with the GI, neurology, ICU,  and discussed lab and imaging findings as well as pertinent plan - see ed course   Dispostion:  After consideration of the diagnostic results and the patients response to treatment, I feel that the patent would benefit from ICU admission.        Final Clinical Impression(s) / ED Diagnoses CC: Sepsis, acute encephalopathy, GI bleed, UTI   Rx / DC Orders ED Discharge Orders     None         Wyvonnia Dusky, MD 11/13/21 1408

## 2021-11-12 NOTE — Progress Notes (Signed)
Since patient has arrived on unit, the patient has alarmed low minute ventilation and tidal volumes have dropped but improves after suctioning. When pt is suctioned, moderate-copious amounts of red/brown secretions that are chunky representing plugs vs. Clots. Elink called and notified due to being the second time in an hour.

## 2021-11-12 NOTE — Procedures (Signed)
Procedure note bronchoscopy Consent is emergency due to discrepancy in the tidal volume and alarms on the vent with clots coming into the suction Portable bronchoscopy was used introduced through the ET tube which terminated above the carina there was a big clot with valve mechanism at the tip of the ET tube air was heart to suction through the ET tube and bronc so I had to use NTS suction through the ET tube to get it at the end there was a 10 to 15 cm clot cylindrical shape that was extracted from the patient's lung with improvement in the vent dynamics After that the bronc and complete airway inspection was done which to the right lower lobe and BAL was done with 30 cc with collection of 15 cc of bloody turbid material sent for cultures.  No complications to report.

## 2021-11-12 NOTE — Progress Notes (Signed)
Pharmacy Antibiotic Note  Kristen Murphy is a 56 y.o. female admitted on 11/12/2021 with r/o meningitis.  Pharmacy has been consulted for Vancomycin and acyclovir dosing.  Wbc elevated, SCr 1.1. CrCl ~ 54 mL/min  Plan: -Ceftriaxone 2 gm IV Q 12 hours per MD -Vancomycin 1500 mg IV load followed by vancomycin 750 mg IV Q 12 hours per traditional nomogram. Aim for goal trough of 15-20 mcg/mL -Acyclovir 10 mg/kg Q 8 hours  -Monitor CBC, renal fx, cultures and clinical progress -Vanc levels as indicated   Height: 5\' 2"  (157.5 cm) Weight: 75.6 kg (166 lb 10.7 oz) IBW/kg (Calculated) : 50.1  Temp (24hrs), Avg:100.3 F (37.9 C), Min:99.8 F (37.7 C), Max:101.4 F (38.6 C)  Recent Labs  Lab 11/12/21 1508 11/12/21 1514 11/12/21 1518  WBC 18.6*  --   --   CREATININE 1.26* 1.10*  --   LATICACIDVEN  --   --  1.5    Estimated Creatinine Clearance: 54.4 mL/min (A) (by C-G formula based on SCr of 1.1 mg/dL (H)).    Allergies  Allergen Reactions   Glimepiride Anaphylaxis    Has sulfa in it per pharmacy   Sulfa Antibiotics Anaphylaxis and Hives   Effexor [Venlafaxine] Hives    Antimicrobials this admission: Ceftriaxone 2/19 >>  Vancomycin 2/19 >>  Acyclovir 2/19 >>   Dose adjustments this admission:   Microbiology results: 2/19 BCx:    Thank you for allowing pharmacy to be a part of this patients care.  Albertina Parr, PharmD., BCCCP Clinical Pharmacist Please refer to [facility] for unit-specific pharmacist

## 2021-11-12 NOTE — ED Notes (Addendum)
Pt continues to be extremely confused and disoriented. Pt continuously pulling at lines and trying to get out of bed, even after medication. This RN has been unable to leave the bedside since returning from CT at 1515

## 2021-11-12 NOTE — ED Notes (Signed)
Pt to CT

## 2021-11-12 NOTE — ED Notes (Signed)
Pt being emergently intubated by Trifan.  20 etomidate by Malcolm Metro RN

## 2021-11-13 ENCOUNTER — Inpatient Hospital Stay (HOSPITAL_COMMUNITY): Payer: Medicaid Other

## 2021-11-13 ENCOUNTER — Encounter (HOSPITAL_COMMUNITY): Payer: Self-pay | Admitting: Internal Medicine

## 2021-11-13 DIAGNOSIS — R4182 Altered mental status, unspecified: Secondary | ICD-10-CM | POA: Diagnosis not present

## 2021-11-13 DIAGNOSIS — I469 Cardiac arrest, cause unspecified: Secondary | ICD-10-CM

## 2021-11-13 LAB — POCT I-STAT 7, (LYTES, BLD GAS, ICA,H+H)
Acid-Base Excess: 0 mmol/L (ref 0.0–2.0)
Acid-base deficit: 1 mmol/L (ref 0.0–2.0)
Acid-base deficit: 4 mmol/L — ABNORMAL HIGH (ref 0.0–2.0)
Acid-base deficit: 3 mmol/L — ABNORMAL HIGH (ref 0.0–2.0)
Acid-base deficit: 3 mmol/L — ABNORMAL HIGH (ref 0.0–2.0)
Bicarbonate: 28 mmol/L (ref 20.0–28.0)
Bicarbonate: 26.8 mmol/L (ref 20.0–28.0)
Bicarbonate: 23 mmol/L (ref 20.0–28.0)
Bicarbonate: 23.9 mmol/L (ref 20.0–28.0)
Bicarbonate: 24.7 mmol/L (ref 20.0–28.0)
Calcium, Ion: 1.14 mmol/L — ABNORMAL LOW (ref 1.15–1.40)
Calcium, Ion: 1.1 mmol/L — ABNORMAL LOW (ref 1.15–1.40)
Calcium, Ion: 1.08 mmol/L — ABNORMAL LOW (ref 1.15–1.40)
Calcium, Ion: 1.09 mmol/L — ABNORMAL LOW (ref 1.15–1.40)
Calcium, Ion: 1.11 mmol/L — ABNORMAL LOW (ref 1.15–1.40)
HCT: 34 % — ABNORMAL LOW (ref 36.0–46.0)
HCT: 36 % (ref 36.0–46.0)
HCT: 32 % — ABNORMAL LOW (ref 36.0–46.0)
HCT: 33 % — ABNORMAL LOW (ref 36.0–46.0)
HCT: 34 % — ABNORMAL LOW (ref 36.0–46.0)
Hemoglobin: 11.6 g/dL — ABNORMAL LOW (ref 12.0–15.0)
Hemoglobin: 12.2 g/dL (ref 12.0–15.0)
Hemoglobin: 10.9 g/dL — ABNORMAL LOW (ref 12.0–15.0)
Hemoglobin: 11.2 g/dL — ABNORMAL LOW (ref 12.0–15.0)
Hemoglobin: 11.6 g/dL — ABNORMAL LOW (ref 12.0–15.0)
O2 Saturation: 90 %
O2 Saturation: 94 %
O2 Saturation: 89 %
O2 Saturation: 92 %
O2 Saturation: 92 %
Patient temperature: 37
Patient temperature: 37.3
Patient temperature: 37.1
Patient temperature: 37
Patient temperature: 37.1
Potassium: 3.8 mmol/L (ref 3.5–5.1)
Potassium: 3.5 mmol/L (ref 3.5–5.1)
Potassium: 3.1 mmol/L — ABNORMAL LOW (ref 3.5–5.1)
Potassium: 3.4 mmol/L — ABNORMAL LOW (ref 3.5–5.1)
Potassium: 3.6 mmol/L (ref 3.5–5.1)
Sodium: 134 mmol/L — ABNORMAL LOW (ref 135–145)
Sodium: 132 mmol/L — ABNORMAL LOW (ref 135–145)
Sodium: 133 mmol/L — ABNORMAL LOW (ref 135–145)
Sodium: 130 mmol/L — ABNORMAL LOW (ref 135–145)
Sodium: 131 mmol/L — ABNORMAL LOW (ref 135–145)
TCO2: 30 mmol/L (ref 22–32)
TCO2: 29 mmol/L (ref 22–32)
TCO2: 25 mmol/L (ref 22–32)
TCO2: 26 mmol/L (ref 22–32)
TCO2: 26 mmol/L (ref 22–32)
pCO2 arterial: 63 mmHg — ABNORMAL HIGH (ref 32–48)
pCO2 arterial: 61.4 mmHg — ABNORMAL HIGH (ref 32–48)
pCO2 arterial: 52.5 mmHg — ABNORMAL HIGH (ref 32–48)
pCO2 arterial: 52.8 mmHg — ABNORMAL HIGH (ref 32–48)
pCO2 arterial: 53.5 mmHg — ABNORMAL HIGH (ref 32–48)
pH, Arterial: 7.255 — ABNORMAL LOW (ref 7.35–7.45)
pH, Arterial: 7.249 — ABNORMAL LOW (ref 7.35–7.45)
pH, Arterial: 7.25 — ABNORMAL LOW (ref 7.35–7.45)
pH, Arterial: 7.265 — ABNORMAL LOW (ref 7.35–7.45)
pH, Arterial: 7.273 — ABNORMAL LOW (ref 7.35–7.45)
pO2, Arterial: 69 mmHg — ABNORMAL LOW (ref 83–108)
pO2, Arterial: 85 mmHg (ref 83–108)
pO2, Arterial: 68 mmHg — ABNORMAL LOW (ref 83–108)
pO2, Arterial: 75 mmHg — ABNORMAL LOW (ref 83–108)
pO2, Arterial: 76 mmHg — ABNORMAL LOW (ref 83–108)

## 2021-11-13 LAB — GLUCOSE, CAPILLARY
Glucose-Capillary: 107 mg/dL — ABNORMAL HIGH (ref 70–99)
Glucose-Capillary: 110 mg/dL — ABNORMAL HIGH (ref 70–99)
Glucose-Capillary: 119 mg/dL — ABNORMAL HIGH (ref 70–99)
Glucose-Capillary: 150 mg/dL — ABNORMAL HIGH (ref 70–99)
Glucose-Capillary: 192 mg/dL — ABNORMAL HIGH (ref 70–99)
Glucose-Capillary: 224 mg/dL — ABNORMAL HIGH (ref 70–99)
Glucose-Capillary: 82 mg/dL (ref 70–99)

## 2021-11-13 LAB — BPAM RBC
Blood Product Expiration Date: 202303192359
Blood Product Expiration Date: 202303222359
ISSUE DATE / TIME: 202302191748
ISSUE DATE / TIME: 202302191748
Unit Type and Rh: 5100
Unit Type and Rh: 5100

## 2021-11-13 LAB — COMPREHENSIVE METABOLIC PANEL
ALT: 51 U/L — ABNORMAL HIGH (ref 0–44)
AST: 84 U/L — ABNORMAL HIGH (ref 15–41)
Albumin: 2.3 g/dL — ABNORMAL LOW (ref 3.5–5.0)
Alkaline Phosphatase: 65 U/L (ref 38–126)
Anion gap: 10 (ref 5–15)
BUN: 14 mg/dL (ref 6–20)
CO2: 26 mmol/L (ref 22–32)
Calcium: 7.8 mg/dL — ABNORMAL LOW (ref 8.9–10.3)
Chloride: 99 mmol/L (ref 98–111)
Creatinine, Ser: 1.1 mg/dL — ABNORMAL HIGH (ref 0.44–1.00)
GFR, Estimated: 59 mL/min — ABNORMAL LOW (ref >60)
Glucose, Bld: 125 mg/dL — ABNORMAL HIGH (ref 70–99)
Potassium: 3.8 mmol/L (ref 3.5–5.1)
Sodium: 135 mmol/L (ref 135–145)
Total Bilirubin: 0.2 mg/dL — ABNORMAL LOW (ref 0.3–1.2)
Total Protein: 6.4 g/dL — ABNORMAL LOW (ref 6.5–8.1)

## 2021-11-13 LAB — TYPE AND SCREEN
ABO/RH(D): O POS
Antibody Screen: NEGATIVE
Unit division: 0
Unit division: 0

## 2021-11-13 LAB — RESPIRATORY PANEL BY PCR

## 2021-11-13 LAB — ECHOCARDIOGRAM COMPLETE
AR max vel: 1.74 cm2
AV Area VTI: 1.62 cm2
AV Area mean vel: 1.63 cm2
AV Mean grad: 10 mmHg
AV Peak grad: 17.8 mmHg
Ao pk vel: 2.11 m/s
Area-P 1/2: 2.95 cm2
Height: 62 in
MV VTI: 1.13 cm2
S' Lateral: 2.4 cm
Single Plane A4C EF: 71.5 %
Weight: 2666.68 oz

## 2021-11-13 LAB — CBC
HCT: 35.1 % — ABNORMAL LOW (ref 36.0–46.0)
Hemoglobin: 10.7 g/dL — ABNORMAL LOW (ref 12.0–15.0)
MCH: 27.4 pg (ref 26.0–34.0)
MCHC: 30.5 g/dL (ref 30.0–36.0)
MCV: 90 fL (ref 80.0–100.0)
Platelets: 214 10*3/uL (ref 150–400)
RBC: 3.9 MIL/uL (ref 3.87–5.11)
RDW: 16.1 % — ABNORMAL HIGH (ref 11.5–15.5)
WBC: 30.2 10*3/uL — ABNORMAL HIGH (ref 4.0–10.5)
nRBC: 0.1 % (ref 0.0–0.2)

## 2021-11-13 LAB — TROPONIN I (HIGH SENSITIVITY): Troponin I (High Sensitivity): 63 ng/L — ABNORMAL HIGH (ref <18)

## 2021-11-13 LAB — TRIGLYCERIDES: Triglycerides: 314 mg/dL — ABNORMAL HIGH (ref <150)

## 2021-11-13 LAB — PROCALCITONIN: Procalcitonin: 2.04 ng/mL

## 2021-11-13 MED ORDER — SODIUM CHLORIDE 0.9 % IV SOLN
INTRAVENOUS | Status: DC | PRN
Start: 2021-11-13 — End: 2021-11-21

## 2021-11-13 MED ORDER — DEXTROSE 5 % IV SOLN
10.0000 mg/kg | Freq: Three times a day (TID) | INTRAVENOUS | Status: DC
  Administered 2021-11-13 – 2021-11-14 (×3): 605 mg via INTRAVENOUS
  Filled 2021-11-13 (×5): qty 12.1

## 2021-11-13 NOTE — Progress Notes (Signed)
[facility] Progress Note Patient Name: Kristen Murphy DOB: [birth year] MRN: [id number]   Date of Service  11/13/2021  HPI/Events of Note  ABG 7.25/63/69 On FiO2 100% PEEP 5 Still with vent desynchrony titrating Versed, Fentanyl and propofol  eICU Interventions  Increase PEEP to 10     Intervention Category Major Interventions: Hypoxemia - evaluation and management  Judd Lien 11/13/2021, 4:17 AM

## 2021-11-13 NOTE — Progress Notes (Addendum)
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 Brought from home to ed yesterday with right sided weakness, confusion. Code Stroke called in route but noted to febrile. Head CT negative. Neurology saw, low suspicion for stroke. She had a cardiopulmonary arrest, intubated. Blood clots removed during OGT suctioning, GI consult was called.    # GI bleed. Unclear source. EGD 2/10 > clots in stomach but no lesion found.  --Hgb is stable at 11.6 which is her baseline --Within the last hour RN has suctioned out ~ 50 ml of dark red drainage from mouth. Stool in ostomy is brown. Continue to monitor --Continue BID PPI for now.      #Hartmann's procedure w/ colostomy for Rectovaginal fistula after XRT and other Tx for cervical cancer   # Possible left liver lobe lesion, there is a geographic distribution the radiologist raises the possibility of mass.   OBJECTIVE   EGD 11/12/21 EGD --Blood in clots in the gastric fundus and in the gastric body. Several - easily removed - no bleeding lesions/source found - Erosive gastropathy with no stigmata of recent bleeding. OG TUBE TRAUMA IN BODY - Mottled muocsal features mucosa in the antrum.   Previous GI studies:  She did have an EGD last April at [facility], with a small GE junction ulcer.  She also had a colonoscopy with random biopsies because of diarrhea issues there was a rectal ulcer as well.  Tubular adenoma removed, normal random biopsies, and benign rectal ulcer.  The colonoscopy was otherwise unremarkable except for a diverticulum.  The ulcer was thought to be a stercoral ulcer    Scheduled inpatient medications:   chlorhexidine gluconate (MEDLINE KIT)  15 mL Mouth Rinse  BID   Chlorhexidine Gluconate Cloth  6 each Topical Daily   fentaNYL (SUBLIMAZE) injection  50 mcg Intravenous Once   insulin aspart  0-15 Units Subcutaneous Q4H   mouth rinse  15 mL Mouth Rinse 10 times per day   [START ON 11/16/2021] pantoprazole  40 mg Intravenous Q12H   sodium chloride flush  3 mL Intravenous Once   Continuous inpatient infusions:   sodium chloride     acyclovir 755 mg (11/13/21 1201)   cefTRIAXone (ROCEPHIN)  IV Stopped (11/13/21 0541)   fentaNYL infusion INTRAVENOUS 150 mcg/hr (11/13/21 1201)   levETIRAcetam Stopped (11/13/21 1020)   midazolam 14 mg/hr (11/13/21 1201)   norepinephrine (LEVOPHED) Adult infusion 14 mcg/min (11/13/21 1203)   propofol (DIPRIVAN) infusion 25 mcg/kg/min (11/13/21 1202)   vancomycin Stopped (11/13/21 0902)   PRN inpatient medications: albuterol, docusate, fentaNYL, midazolam, polyethylene glycol  Vital signs in last 24 hours: Temp:  [97.9 F (36.6 C)-102.9 F (39.4 C)] 98.1 F (36.7 C) (02/20 1200) Pulse Rate:  [82-128] 88 (02/20 1200) Resp:  [8-79] 14 (02/20 1200) BP: (85-199)/(46-181) 108/47 (02/20 1200) SpO2:  [0 %-100 %] 100 % (02/20 1200) FiO2 (%):  [100 %] 100 % (02/20 1126) Weight:  [75.6 kg] 75.6 kg (02/19 1536) Last BM Date : 11/12/21  Intake/Output Summary (Last 24 hours) at 11/13/2021 1325 Last data filed at 11/13/2021 1201 Gross per 24 hour  Intake 4671.77 ml  Output 1480 ml  Net 3191.77 ml     Physical Exam:  General: sedated female in NAD Heart:  Regular rate and rhythm. No lower extremity edema Pulmonary: Intubated on ventilator Abdomen: Soft, nondistended. Normal bowel sounds.  Neurologic: sedated.   Filed Weights   11/12/21 1536  Weight: 75.6 kg    ntake/Output from previous day: 02/19 0701 - 02/20 0700 In: 3870.6 [I.V.:1299.6; IV Piggyback:2571] Out: 1110 [Urine:1010; Stool:100] Intake/Output this shift: Total I/O In: 801.2 [I.V.:551.3; IV Piggyback:249.9] Out: 370  [Urine:370]   DIAGNOSTIC STUDIES THIS ADMISSION:  CT HEAD WO CONTRAST (5MM)  Result Date: 11/12/2021 CLINICAL DATA:  Altered mental status following cardiac arrest, initial encounter EXAM: CT HEAD WITHOUT CONTRAST TECHNIQUE: Contiguous axial images were obtained from the base of the skull through the vertex without intravenous contrast. RADIATION DOSE REDUCTION: This exam was performed according to the departmental dose-optimization program which includes automated exposure control, adjustment of the mA and/or kV according to patient size and/or use of iterative reconstruction technique. COMPARISON:  CT from earlier in the same day. FINDINGS: Brain: No findings to suggest acute hemorrhage, acute infarction or space-occupying mass lesion are noted. 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 No new focal abnormality is seen. Vascular: No hyperdense vessel or unexpected calcification. Skull: Normal. Negative for fracture or focal lesion. Sinuses/Orbits: No acute finding. Other: None. IMPRESSION: No acute abnormality noted. No significant change from the prior exam. Electronically Signed   By: Inez Catalina M.D.   On: 11/12/2021 19:14   CT Angio Chest PE W and/or Wo Contrast  Result Date: 11/12/2021 CLINICAL DATA:  Recent stroke-like symptoms, possible sepsis EXAM: CT ANGIOGRAPHY CHEST CT ABDOMEN AND PELVIS WITH CONTRAST TECHNIQUE: Multidetector CT imaging of the chest was performed using the standard protocol during bolus administration of intravenous contrast. Multiplanar CT image reconstructions and MIPs were obtained to evaluate the vascular anatomy. Multidetector CT imaging of the abdomen and pelvis was performed using the standard protocol during bolus administration of intravenous contrast. RADIATION DOSE REDUCTION: This exam was performed according to the departmental dose-optimization program which includes automated exposure control, adjustment of the mA and/or kV according to patient size and/or use of  iterative reconstruction technique. CONTRAST:  147m OMNIPAQUE IOHEXOL 350 MG/ML SOLN COMPARISON:  Chest x-ray from earlier in the same day, CT from 09/08/2021 FINDINGS: CTA CHEST FINDINGS Cardiovascular: Atherosclerotic calcifications are noted. No aneurysmal dilatation or dissection of the thoracic aorta is seen. No cardiac enlargement is noted. Scattered coronary calcifications are noted. The pulmonary artery shows a normal branching pattern. No filling defect to suggest pulmonary embolism is seen. 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 Mediastinum/Nodes: Thoracic inlet is within normal limits. Endotracheal tube is noted in satisfactory position. Gastric catheter extends into the stomach. The esophagus is within normal limits. No sizable hilar or mediastinal adenopathy is noted Lungs/Pleura: Lungs are well aerated bilaterally. Diffuse consolidation is noted in the medial aspect of the right lower lobe consistent with acute infiltrate. Some associated cavitary changes are noted. No sizable effusion is noted. No other focal infiltrate is noted. Mild left basilar atelectasis is seen. Well-formed bleb is noted in the medial aspect of the left lung base which was not present on the prior exam. Air-fluid level is noted within. Previously noted effusions bilaterally have resolved in the interval. Musculoskeletal: Degenerative changes of the thoracic spine are noted. No acute rib abnormality is seen. Review of the MIP images confirms the above findings. CT ABDOMEN and PELVIS FINDINGS Hepatobiliary: Gallbladder is decompressed. Liver demonstrates a normal enhancement pattern with the exception of a  geographic area of decreased attenuation in the lateral segment of the left lobe of the liver. This has a somewhat masslike appearance with hepatic contour change best seen on image number 26 of series 5. This was not present on the prior examination. Given the somewhat masslike density the  possibility of a neoplastic disease could not be totally excluded given the patient's clinical history. Pancreas: Unremarkable. No pancreatic ductal dilatation or surrounding inflammatory changes. Spleen: Normal in size without focal abnormality. Adrenals/Urinary Tract: Adrenal glands are within normal limits. The left kidney demonstrates a normal enhancement pattern. No renal calculi or obstructive changes are noted. Right kidney has been surgically removed. Ureter is within normal limits. Bladder is incompletely distended. Stomach/Bowel: Hartmann's pouch is noted in the mid descending colon. Left upper quadrant colostomy is seen assistant with the patient's given clinical history. No obstructive or inflammatory changes are noted. The appendix is within normal limits. No small bowel or gastric abnormality is noted. Vascular/Lymphatic: Aortic atherosclerosis. No enlarged abdominal or pelvic lymph nodes. Reproductive: Uterus and bilateral adnexa are unremarkable. Other: No abdominal wall hernia or abnormality. No abdominopelvic ascites. Musculoskeletal: No acute or significant osseous findings. Review of the MIP images confirms the above findings. IMPRESSION: CTA of the chest: No evidence of pulmonary emboli. Large mildly cavitary infiltrate in superior segment of right lower lobe consistent with acute pneumonia. Some minimal infiltrate is noted in the left base as well. Small bleb with air-fluid level within is noted in the medial aspect of the left base. These findings are all new from the prior exam. Resolution of previously seen effusions. 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 CT of the abdomen and pelvis: Postsurgical changes consistent with colostomy. Area of decreased attenuation and enhancement within the lateral segment of the left lobe of the liver with a somewhat mass like appearance along the margin of the liver. Possible metastatic disease would deserve consideration. Status post right  nephrectomy. Electronically Signed   By: Inez Catalina M.D.   On: 11/12/2021 19:33   CT ABDOMEN PELVIS W CONTRAST  Result Date: 11/12/2021 CLINICAL DATA:  Recent stroke-like symptoms, possible sepsis EXAM: CT ANGIOGRAPHY CHEST CT ABDOMEN AND PELVIS WITH CONTRAST TECHNIQUE: Multidetector CT imaging of the chest was performed using the standard protocol during bolus administration of intravenous contrast. Multiplanar CT image reconstructions and MIPs were obtained to evaluate the vascular anatomy. Multidetector CT imaging of the abdomen and pelvis was performed using the standard protocol during bolus administration of intravenous contrast. RADIATION DOSE REDUCTION: This exam was performed according to the departmental dose-optimization program which includes automated exposure control, adjustment of the mA and/or kV according to patient size and/or use of iterative reconstruction technique. CONTRAST:  136m OMNIPAQUE IOHEXOL 350 MG/ML SOLN COMPARISON:  Chest x-ray from earlier in the same day, CT from 09/08/2021 FINDINGS: CTA CHEST FINDINGS Cardiovascular: Atherosclerotic calcifications are noted. No aneurysmal dilatation or dissection of the thoracic aorta is seen. No cardiac enlargement is noted. Scattered coronary calcifications are noted. The pulmonary artery shows a normal branching pattern. No filling defect to suggest pulmonary embolism is seen. 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 Mediastinum/Nodes: Thoracic inlet is within normal limits. Endotracheal tube is noted in satisfactory position. Gastric catheter extends into the stomach. The esophagus is within normal limits. No sizable hilar or mediastinal adenopathy is noted Lungs/Pleura: Lungs are well aerated bilaterally. Diffuse consolidation is noted in the medial aspect of the right lower lobe consistent with acute infiltrate. Some associated  cavitary changes are noted. No sizable effusion is noted. No other focal  infiltrate is noted. Mild left basilar atelectasis is seen. Well-formed bleb is noted in the medial aspect of the left lung base which was not present on the prior exam. Air-fluid level is noted within. Previously noted effusions bilaterally have resolved in the interval. Musculoskeletal: Degenerative changes of the thoracic spine are noted. No acute rib abnormality is seen. Review of the MIP images confirms the above findings. CT ABDOMEN and PELVIS FINDINGS Hepatobiliary: Gallbladder is decompressed. Liver demonstrates a normal enhancement pattern with the exception of a geographic area of decreased attenuation in the lateral segment of the left lobe of the liver. This has a somewhat masslike appearance with hepatic contour change best seen on image number 26 of series 5. This was not present on the prior examination. Given the somewhat masslike density the possibility of a neoplastic disease could not be totally excluded given the patient's clinical history. Pancreas: Unremarkable. No pancreatic ductal dilatation or surrounding inflammatory changes. Spleen: Normal in size without focal abnormality. Adrenals/Urinary Tract: Adrenal glands are within normal limits. The left kidney demonstrates a normal enhancement pattern. No renal calculi or obstructive changes are noted. Right kidney has been surgically removed. Ureter is within normal limits. Bladder is incompletely distended. Stomach/Bowel: Hartmann's pouch is noted in the mid descending colon. Left upper quadrant colostomy is seen assistant with the patient's given clinical history. No obstructive or inflammatory changes are noted. The appendix is within normal limits. No small bowel or gastric abnormality is noted. Vascular/Lymphatic: Aortic atherosclerosis. No enlarged abdominal or pelvic lymph nodes. Reproductive: Uterus and bilateral adnexa are unremarkable. Other: No abdominal wall hernia or abnormality. No abdominopelvic ascites. Musculoskeletal: No acute  or significant osseous findings. Review of the MIP images confirms the above findings. IMPRESSION: CTA of the chest: No evidence of pulmonary emboli. Large mildly cavitary infiltrate in superior segment of right lower lobe consistent with acute pneumonia. Some minimal infiltrate is noted in the left base as well. Small bleb with air-fluid level within is noted in the medial aspect of the left base. These findings are all new from the prior exam. Resolution of previously seen effusions. 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 CT of the abdomen and pelvis: Postsurgical changes consistent with colostomy. Area of decreased attenuation and enhancement within the lateral segment of the left lobe of the liver with a somewhat mass like appearance along the margin of the liver. Possible metastatic disease would deserve consideration. Status post right nephrectomy. Electronically Signed   By: Inez Catalina M.D.   On: 11/12/2021 19:33   Portable Chest xray  Result Date: 11/13/2021 CLINICAL DATA:  A 56 year old female presents for evaluation of respiratory failure. EXAM: PORTABLE CHEST 1 VIEW COMPARISON:  Comparison is made with November 12, 2021. FINDINGS: Pacer defibrillator pad projects over the central chest. EKG leads project over the chest and upper abdomen. LEFT-sided central venous catheter terminates at the caval to atrial junction. Endotracheal tube approximately 3.9 cm from the carina. Cardiomediastinal contours and hilar structures are stable. Added density about the RIGHT hilum compatible with known RIGHT lower lobe perihilar masslike area seen on recent imaging without gross change. No additional areas of consolidation or masslike appearance. No pneumothorax. On limited assessment there is no acute skeletal process. IMPRESSION: 1. Endotracheal tube approximately 3.9 cm from the carina. 2. No substantial change since recent imaging. 3. RIGHT perihilar masslike area may represent necrotic pneumonia,  area without abnormality in September of  2022. Possibility of neoplasm is considered in this patient with history of cervical cancer. Short interval follow-up after therapy is suggested. Pulmonary consultation may be helpful if not yet obtained. Electronically Signed   By: Zetta Bills M.D.   On: 11/13/2021 08:04   DG CHEST PORT 1 VIEW  Result Date: 11/12/2021 CLINICAL DATA:  Central line placement. EXAM: PORTABLE CHEST 1 VIEW COMPARISON:  Radiograph earlier today. FINDINGS: New left subclavian central venous catheter tip overlies the lower SVC. No pneumothorax. Endotracheal tube and enteric tube remain in place. Stable heart size. Right perihilar masslike opacity characterized on CT earlier today. There is mild background bronchial thickening. No significant pleural effusion. IMPRESSION: 1. New left subclavian central venous catheter with tip overlying the lower SVC. No pneumothorax. 2. Right perihilar masslike opacity, characterized on CT earlier today. Electronically Signed   By: Keith Rake M.D.   On: 11/12/2021 21:02   DG Chest Portable 1 View  Result Date: 11/12/2021 CLINICAL DATA:  ng/ett placement EXAM: PORTABLE CHEST 1 VIEW COMPARISON:  Same day chest radiograph. FINDINGS: Masslike prominence of the right hilum persists. Cardiac silhouette is mildly enlarged. No consolidation. No visible pleural effusions or pneumothorax on this semi upright AP radiograph. IMPRESSION: 1. Masslike prominence of the right hilum persists. Recommend CT chest (preferably with contrast) when able to exclude adenopathy and/or mass. 2. Endotracheal tube tip approximately 2.3 cm above the carina. 3. Gastric tube courses below the diaphragm with the tip outside the field of view. Electronically Signed   By: Margaretha Sheffield M.D.   On: 11/12/2021 18:27   DG Chest Port 1 View  Result Date: 11/12/2021 CLINICAL DATA:  Questionable sepsis.  Right-sided weakness. EXAM: PORTABLE CHEST 1 VIEW COMPARISON:  04/17/2020.   Chest CT 06/16/2021 FINDINGS: Mild cardiomegaly. Aortic atherosclerosis. No evidence of consolidation, collapse or effusion. Mild hilar prominence right more than left felt most likely secondary to the portable AP technique and poor inspiration. Additional chest radiographic follow-up suggested when the patient is able to have a standard two view exam to exclude right hilar prominence. IMPRESSION: Cardiomegaly.  Aortic atherosclerosis. Technical limitations of poor inspiration and portable AP technique. Recommend follow-up two-view chest radiography when able to exclude right hilar mass. Electronically Signed   By: Nelson Chimes M.D.   On: 11/12/2021 16:08   EEG adult  Result Date: 11/13/2021 Lora Havens, MD     11/13/2021  1:18 PM Patient Name: Kristen Murphy MRN: [id number] Epilepsy Attending: Lora Havens Referring Physician/Provider: Kerney Elbe, MD Date: 11/13/2021 Duration: 22.22 mins Patient history: 56 year old woman with a history of cervical cancer, status post nephrectomy 2 months ago, colostomy, CAD, diabetes, depression, hypertension, remote history of stroke without residual deficits who was brought in by EMS for altered mental status.  EEG to evaluate for seizure. Level of alertness: obtunded AEDs during EEG study: versed, propofol, LEV Technical aspects: This EEG study was done with scalp electrodes positioned according to the 10-20 International system of electrode placement. Electrical activity was acquired at a sampling rate of _0  and reviewed with a high frequency filter of _1  and a low frequency filter of _2 . EEG data were recorded continuously and digitally stored. Description: EEG showed continuous generalized background attenuation.  Hyperventilation and photic stimulation were not performed.   Of note, study was technically difficult due to significant myogenic artifact ABNORMALITY -Background attenuation, generalized IMPRESSION: This technically difficult study is  suggestive of profound degree of encephalopathy, nonspecific etiology.  No seizures or epileptiform discharges were  seen throughout the recording. Lora Havens   ECHOCARDIOGRAM COMPLETE  Result Date: 11/13/2021    ECHOCARDIOGRAM REPORT   Patient Name:   LUDY MESSAMORE Date of Exam: 11/13/2021 Medical Rec #:  [id number]       Height:       62.0 in Accession #:    [id number]      Weight:       166.7 lb Date of Birth:  [birth year]       BSA:          1.769 m Patient Age:    18 years        BP:           103/60 mmHg Patient Gender: F               HR:           88 bpm. Exam Location:  Inpatient Procedure: 2D Echo, Cardiac Doppler and Color Doppler Indications:    Cardiac arrest  History:        Patient has prior history of Echocardiogram examinations, most                 recent 02/19/2019. CAD, Stroke, Signs/Symptoms:Murmur and                 Dyspnea; Risk Factors:Hypertension and Diabetes. 02/19/2019 cath.  Sonographer:    Luisa Hart RDCS Referring Phys: [place]  1. Left ventricular ejection fraction, by estimation, is 65 to 70%. The left ventricle has normal function. The left ventricle has no regional wall motion abnormalities. Left ventricular diastolic parameters are consistent with Grade I diastolic dysfunction (impaired relaxation).  2. Right ventricular systolic function is normal. The right ventricular size is normal.  3. The mitral valve is normal in structure. No evidence of mitral valve regurgitation. Mild to moderate mitral stenosis.  4. The aortic valve is normal in structure. Aortic valve regurgitation is not visualized. Mild aortic valve stenosis. FINDINGS  Left Ventricle: Left ventricular ejection fraction, by estimation, is 65 to 70%. The left ventricle has normal function. The left ventricle has no regional wall motion abnormalities. The left ventricular internal cavity size was small. There is no left ventricular hypertrophy. Left ventricular diastolic parameters are  consistent with Grade I diastolic dysfunction (impaired relaxation). Right Ventricle: The right ventricular size is normal. Right vetricular wall thickness was not well visualized. Right ventricular systolic function is normal. Left Atrium: Left atrial size was normal in size. Right Atrium: Right atrial size was normal in size. Pericardium: There is no evidence of pericardial effusion. Mitral Valve: The mitral valve is normal in structure. No evidence of mitral valve regurgitation. Mild to moderate mitral valve stenosis. MV peak gradient, 10.8 mmHg. The mean mitral valve gradient is 6.0 mmHg. Tricuspid Valve: The tricuspid valve is normal in structure. Tricuspid valve regurgitation is mild. Aortic Valve: The aortic valve is normal in structure. Aortic valve regurgitation is not visualized. Mild aortic stenosis is present. Aortic valve mean gradient measures 10.0 mmHg. Aortic valve peak gradient measures 17.8 mmHg. Aortic valve area, by VTI measures 1.62 cm. Pulmonic Valve: The pulmonic valve was normal in structure. Pulmonic valve regurgitation is not visualized. Aorta: The aortic root and ascending aorta are structurally normal, with no evidence of dilitation. IAS/Shunts: The atrial septum is grossly normal.  LEFT VENTRICLE PLAX 2D LVIDd:         3.70 cm     Diastology LVIDs:  2.40 cm     LV e' medial:    4.46 cm/s LV PW:         1.20 cm     LV E/e' medial:  19.3 LV IVS:        1.00 cm     LV e' lateral:   6.64 cm/s LVOT diam:     2.10 cm     LV E/e' lateral: 13.0 LV SV:         56 LV SV Index:   32 LVOT Area:     3.46 cm  LV Volumes (MOD) LV vol d, MOD A4C: 50.1 ml LV vol s, MOD A4C: 14.3 ml LV SV MOD A4C:     50.1 ml RIGHT VENTRICLE RV S prime:     19.60 cm/s TAPSE (M-mode): 2.4 cm LEFT ATRIUM             Index LA diam:        4.00 cm 2.26 cm/m LA Vol (A2C):   33.2 ml 18.77 ml/m LA Vol (A4C):   50.8 ml 28.71 ml/m LA Biplane Vol: 42.2 ml 23.85 ml/m  AORTIC VALVE                     PULMONIC VALVE AV  Area (Vmax):    1.74 cm      PV Vmax:       1.36 m/s AV Area (Vmean):   1.63 cm      PV Vmean:      87.750 cm/s AV Area (VTI):     1.62 cm      PV VTI:        0.215 m AV Vmax:           211.00 cm/s   PV Peak grad:  7.3 mmHg AV Vmean:          144.000 cm/s  PV Mean grad:  4.0 mmHg AV VTI:            0.344 m AV Peak Grad:      17.8 mmHg AV Mean Grad:      10.0 mmHg LVOT Vmax:         106.00 cm/s LVOT Vmean:        67.700 cm/s LVOT VTI:          0.161 m LVOT/AV VTI ratio: 0.47  AORTA Ao Asc diam: 3.10 cm MITRAL VALVE                TRICUSPID VALVE MV Area (PHT): 2.95 cm     TR Peak grad:   12.8 mmHg MV Area VTI:   1.13 cm     TR Vmax:        179.00 cm/s MV Peak grad:  10.8 mmHg MV Mean grad:  6.0 mmHg     SHUNTS MV Vmax:       1.64 m/s     Systemic VTI:  0.16 m MV Vmean:      112.0 cm/s   Systemic Diam: 2.10 cm MV Decel Time: 257 msec MV E velocity: 86.10 cm/s MV A velocity: 117.00 cm/s MV E/A ratio:  0.74 Mertie Moores MD Electronically signed by Mertie Moores MD Signature Date/Time: 11/13/2021/12:23:16 PM    Final    CT HEAD CODE STROKE WO CONTRAST  Result Date: 11/12/2021 CLINICAL DATA:  Code stroke. Neuro deficit, acute, stroke suspected. EXAM: CT HEAD WITHOUT CONTRAST TECHNIQUE: Contiguous axial images were obtained from the base of the skull through the  vertex without intravenous contrast. RADIATION DOSE REDUCTION: This exam was performed according to the departmental dose-optimization program which includes automated exposure control, adjustment of the mA and/or kV according to patient size and/or use of iterative reconstruction technique. COMPARISON:  04/17/2020 FINDINGS: Brain: The study suffers from significant motion degradation. No focal abnormality is seen affecting the brainstem or cerebellum. Cerebral hemispheres show an old lacunar infarction in the left basal ganglia an old left frontal cortical and subcortical infarction. No sign of acute infarction, mass lesion, hemorrhage, hydrocephalus or  extra-axial collection. Vascular: There is atherosclerotic calcification of the major vessels at the base of the brain. Skull: Negative, allowing for the motion degradation. Sinuses/Orbits: Clear/normal Other: None ASPECTS ([place] Stroke Program Early CT Score) - Ganglionic level infarction (caudate, lentiform nuclei, internal capsule, insula, M1-M3 cortex): 7 - Supraganglionic infarction (M4-M6 cortex): 3 Total score (0-10 with 10 being normal): 10 IMPRESSION: 1. Motion degraded study. No acute CT finding. Old left basal ganglia lacunar infarction. Old left frontal cortical and subcortical infarction. 2. ASPECTS is 10, allowing for motion. 3. These results were called by telephone at the time of interpretation on 11/12/2021 at 3:22 pm to provider MATTHEW TRIFAN , who verbally acknowledged these results. Electronically Signed   By: Nelson Chimes M.D.   On: 11/12/2021 15:27       Lab Results: Recent Labs    11/12/21 1508 11/12/21 1514 11/12/21 2233 11/13/21 0228 11/13/21 0353  WBC 18.6*  --   --  30.2*  --   HGB 8.9*   < > 9.3* 10.7* 11.6*  HCT 28.9*   < > 31.3* 35.1* 34.0*  PLT 251  --   --  214  --    < > = values in this interval not displayed.   BMET Recent Labs    11/12/21 1508 11/12/21 1514 11/12/21 1759 11/12/21 1946 11/13/21 0228 11/13/21 0353  NA 126* 129*   < > 131* 135 134*  K 3.0* 3.1*   < > 4.0 3.8 3.8  CL 93* 97*  --   --  99  --   CO2 20*  --   --   --  26  --   GLUCOSE 417* 412*  --   --  125*  --   BUN 16 15  --   --  14  --   CREATININE 1.26* 1.10*  --   --  1.10*  --   CALCIUM 7.9*  --   --   --  7.8*  --    < > = values in this interval not displayed.   LFT Recent Labs    11/13/21 0228  PROT 6.4*  ALBUMIN 2.3*  AST 84*  ALT 51*  ALKPHOS 65  BILITOT 0.2*   PT/INR Recent Labs    11/12/21 1508  LABPROT 15.0  INR 1.2   Hepatitis Panel No results for input(s): HEPBSAG, HCVAB, HEPAIGM, HEPBIGM in the last 72 hours.    Principal Problem:    Cardiac arrest [facility]) Active Problems:   Sepsis ([place])     LOS: 1 day   Tye Savoy ,NP 11/13/2021, 1:25 PM   Attending physician's note   I have reviewed the chart and examined the patient. I performed a substantive portion of this encounter, including complete performance of at least one of the key components, in conjunction with the APP. I agree with the Advanced Practitioner's note, impression and recommendations.  Discussed care with nursing staff.  No further bleeding. EGD last night  neg except for clots in the stomach.  No definite bleeding lesions noted.  No rpt EGD unless active bleeding. Trend CBC Continue PPIs for now. Please call us if with any GI problems.  Will standby   Carmell Austria, MD [facility] [phone]

## 2021-11-13 NOTE — Procedures (Signed)
Arterial Catheter Insertion Procedure Note  Kristen Murphy  [id number]  [birth year]  Date:11/13/21  Time:5:28 PM    Provider Performing: Clementeen Graham    Procedure: Insertion of Arterial Line [id number]) with US guidance ([id number])   Indication(s) Blood pressure monitoring and/or need for frequent ABGs  Consent Risks of the procedure as well as the alternatives and risks of each were explained to the patient and/or caregiver.  Consent for the procedure was obtained and is signed in the bedside chart  Anesthesia None   Time Out Verified patient identification, verified procedure, site/side was marked, verified correct patient position, special equipment/implants available, medications/allergies/relevant history reviewed, required imaging and test results available.   Sterile Technique Maximal sterile technique including full sterile barrier drape, hand hygiene, sterile gown, sterile gloves, mask, hair covering, sterile ultrasound probe cover (if used).   Procedure Description Area of catheter insertion was cleaned with chlorhexidine and draped in sterile fashion. With real-time ultrasound guidance an arterial catheter was placed into the right radial artery.  Appropriate arterial tracings confirmed on monitor.     Complications/Tolerance None; patient tolerated the procedure well.   EBL Minimal   Specimen(s) None  [name] ACNP-BC [place] Pager # [phone] OR # [phone] if no answer

## 2021-11-13 NOTE — Progress Notes (Signed)
EEG complete - results pending 

## 2021-11-13 NOTE — Progress Notes (Signed)
NAME:  Kristen Murphy, MRN:  [id number], DOB:  June 13, 1966, LOS: 1 ADMISSION DATE:  11/12/2021, CONSULTATION DATE: 11/12/2021 REFERRING MD: Dr. Langston Masker, CHIEF COMPLAINT: PEA arrest  History of Present Illness:   56 year old woman with a history of tobacco use, cervical cancer ([facility]), post nephrectomy and colostomy, CAD with LAD stent 01/2019, diabetes, depression, hypertension, remote CVA without known deficits.  Also with a history of substance abuse.  Brought in by her son to the ED 2/19 with fever, altered mental status and slurred speech. Apparently has had a flulike illness for a week, was found confused on 2/19.  Some suspicion by son that she may have been taking more prescription narcotics, also taking many NSAIDs per his report.  She was disoriented in the ED, febrile 103F, hyperglycemic. In the ED she apparently experienced acute hypoxemia, had some blood from her mouth, question emesis versus hemoptysis.  Devolved to PEA and required emergent ET intubation and CPR.  ROSC after 2 minutes.  OG tube placed with bright red blood obtained.  Chest x-ray with mild right hilar prominence, more notable on postintubation film.  No infiltrates or effusions She has received empiric antibiotics, is receiving 1 unit PRBC, is about to start Protonix infusion. Hemodynamically stabilized, mechanically ventilated.   Pertinent  Medical History   Past Medical History:  Diagnosis Date   Anemia    Anxiety    Arthritis    Asthma    Cervical cancer ([place])    Chronic kidney disease    Coronary artery disease    Depression    Diabetes mellitus without complication (HCC)    Dyspnea    Headache    Heart murmur    Hx of adenomatous polyp of colon    Hypertension    Rectovaginal fistula    Seizures ([place])    Stercoral ulcer of rectum 12/2020   Stroke [facility])     Significant Hospital Events: Including procedures, antibiotic start and stop dates in addition to other pertinent events   Head CT 2/19 > no  acute CT findings, old left basal ganglia lacunar, old left frontal cortical and subcortical infarct   Interim History / Subjective:   EGD yesterday. No source of bleed identified CT head with no acute findings   Objective   Blood pressure (!) 107/57, pulse 86, temperature 98.6 F (37 C), resp. rate 18, height 5\' 2"  (1.575 m), weight 75.6 kg, SpO2 99 %.    Vent Mode: PRVC FiO2 (%):  [100 %] 100 % Set Rate:  [18 bmp-24 bmp] 24 bmp Vt Set:  [400 mL] 400 mL PEEP:  [5 cmH20-10 cmH20] 10 cmH20 Plateau Pressure:  [21 cmH20-43 cmH20] 21 cmH20   Intake/Output Summary (Last 24 hours) at 11/13/2021 8502 Last data filed at 11/13/2021 0900 Gross per 24 hour  Intake 4283.86 ml  Output 1110 ml  Net 3173.86 ml   Filed Weights   11/12/21 1536  Weight: 75.6 kg   Physical Exam: General: Critically ill-appearing, no acute distress HENT: [place], AT, ETT in place Eyes: EOMI, no scleral icterus Respiratory: Coarse rhonchi bilaterally.  No crackles, wheezing or rales Cardiovascular: RRR, -M/R/G, no JVD GI: BS+, soft, nontender Extremities:-Edema,-tenderness Neuro: Sedated, PERRL, no withdrawal to pain  CTA 11/12/21 - RLL cavitary infiltrate CT A/P 11/12/21 - S/p colostomy, s/p right nephrectomy  Resolved Hospital Problem list     Assessment & Plan:   Acute cardiopulmonary arrest, PEA arrest -Defer TTM, CPR time 2 minutes, initiated immediately -Follow for any evidence of  endorgan injury  Acute respiratory failure with hypoxemia secondary to aspiration/necrotizing pneumonia -PRVC 8 cc/kg -VAP prevention orders -PAD protocol for sedation: Propofol, fentanyl -PRN ABG and CXR  Severe sepsis secondary aspiration pneumonia.  Again source unclear.  Consider meningitis, UTI with positive UA -Empiric antibiotics as above -Follow culture data  Acute blood loss anemia - stable History of NSAID abuse. EGD blood clots in gastric fundus/body, erosive gastropathy, no stigmata of bleeding but OG tube  trauma in body -GI following -PPI BID -No history of alcohol, octreotide deferred  Encephalopathy, cause unclear.   Repeat CT head 2/19 neg for acute findings.  Low suspicion for meningitis in setting of alternative infection however covered with antibiotics.  Consider also substances as son concerned about possible narcotic use.  UDS only positive for benzo -PAD protocol for sedation -Appreciate Neurology evaluation. F/u EEG -Consider LP with mental status not improving   Hypertension History of CAD -Holding amlodipine, metoprolol, lisinopril until stabilizing -Hold aspirin in setting of GI blood loss  Diabetes with hyperglycemia -CBGs, SSI if > 180 -Home metformin on hold -Home Farxiga on hold  Hyponatremia, hypocalcemia, hypokalemia -Replace potassium, calcium -Follow BMP  Nutrition -NPO  Best Practice (right click and "Reselect all SmartList Selections" daily)   Diet/type: NPO DVT prophylaxis: SCD GI prophylaxis: PPI Lines: N/A Foley:  N/A Code Status:  full code Last date of multidisciplinary goals of care discussion [Dr Trifan in ED calling to notify pt's son of acute events 2/19]  Labs   CBC: Recent Labs  Lab 11/12/21 1508 11/12/21 1514 11/12/21 1931 11/12/21 1946 11/12/21 2233 11/13/21 0228 11/13/21 0353  WBC 18.6*  --   --   --   --  30.2*  --   NEUTROABS 16.7*  --   --   --   --   --   --   HGB 8.9*   < > 11.7* 12.2 9.3* 10.7* 11.6*  HCT 28.9*   < > 37.7 36.0 31.3* 35.1* 34.0*  MCV 87.0  --   --   --   --  90.0  --   PLT 251  --   --   --   --  214  --    < > = values in this interval not displayed.    Basic abnormalities panel: Recent Labs  Lab 11/12/21 1508 11/12/21 1514 11/12/21 1759 11/12/21 1931 11/12/21 1946 11/13/21 0228 11/13/21 0353  NA 126* 129* 134*  --  131* 135 134*  K 3.0* 3.1* 3.3*  --  4.0 3.8 3.8  CL 93* 97*  --   --   --  99  --   CO2 20*  --   --   --   --  26  --   GLUCOSE 417* 412*  --   --   --  125*  --   BUN  16 15  --   --   --  14  --   CREATININE 1.26* 1.10*  --   --   --  1.10*  --   CALCIUM 7.9*  --   --   --   --  7.8*  --   MG  --   --   --  2.0  --   --   --   PHOS  --   --   --  5.7*  --   --   --    GFR: Estimated Creatinine Clearance: 54.4 mL/min (A) (by C-G formula based on SCr  of 1.1 mg/dL (H)). Recent Labs  Lab 11/12/21 1508 11/12/21 1518 11/12/21 1931 11/12/21 2233 11/13/21 0228  PROCALCITON  --   --  2.04  --   --   WBC 18.6*  --   --   --  30.2*  LATICACIDVEN  --  1.5 2.5* 1.9  --     Liver Function Tests: Recent Labs  Lab 11/12/21 1508 11/13/21 0228  AST 12* 84*  ALT 12 51*  ALKPHOS 58 65  BILITOT 0.3 0.2*  PROT 7.0 6.4*  ALBUMIN 2.5* 2.3*   No results for input(s): LIPASE, AMYLASE in the last 168 hours. No results for input(s): AMMONIA in the last 168 hours.  ABG    Component Value Date/Time   PHART 7.255 (L) 11/13/2021 0353   PCO2ART 63.0 (H) 11/13/2021 0353   PO2ART 69 (L) 11/13/2021 0353   HCO3 28.0 11/13/2021 0353   TCO2 30 11/13/2021 0353   ACIDBASEDEF 3.0 (H) 11/12/2021 1759   O2SAT 90 11/13/2021 0353     Coagulation Profile: Recent Labs  Lab 11/12/21 1508  INR 1.2    Cardiac Enzymes: No results for input(s): CKTOTAL, CKMB, CKMBINDEX, TROPONINI in the last 168 hours.  HbA1C: Hgb A1c MFr Bld  Date/Time Value Ref Range Status  11/12/2021 07:31 PM 7.2 (H) 4.8 - 5.6 % Final    Comment:    (NOTE) Pre diabetes:          5.7%-6.4%  Diabetes:              >6.4%  Glycemic control for   <7.0% adults with diabetes   02/19/2019 03:32 AM 9.2 (H) 4.8 - 5.6 % Final    Comment:    (NOTE) Pre diabetes:          5.7%-6.4% Diabetes:              >6.4% Glycemic control for   <7.0% adults with diabetes     CBG: Recent Labs  Lab 11/12/21 2102 11/13/21 0014 11/13/21 0343 11/13/21 0734  GLUCAP 323* 150* 107* 110*    Review of Systems:   Unobtainable from patient  Past Medical History:  She,  has a past medical history of  Anemia, Anxiety, Arthritis, Asthma, Cervical cancer ([place]), Chronic kidney disease, Coronary artery disease, Depression, Diabetes mellitus without complication ([place]), Dyspnea, Headache, Heart murmur, adenomatous polyp of colon, Hypertension, Rectovaginal fistula, Seizures ([place]), Stercoral ulcer of rectum (12/2020), and Stroke ([place]).   Surgical History:   Past Surgical History:  Procedure Laterality Date   CARDIAC CATHETERIZATION     COLONOSCOPY  12/2020   Subcentimeter adenoma, stercoral ulcer   COLOSTOMY  08/2021   CORONARY STENT INTERVENTION N/A 02/19/2019   Procedure: CORONARY STENT INTERVENTION;  Surgeon: Lorretta Harp, MD;  Location: [facility];  Service: Cardiovascular;  Laterality: N/A;   ESOPHAGOGASTRODUODENOSCOPY  12/2020   Small GE junction ulcer and small hiatal hernia   LEFT HEART CATH AND CORONARY ANGIOGRAPHY N/A 02/19/2019   Procedure: LEFT HEART CATH AND CORONARY ANGIOGRAPHY;  Surgeon: Lorretta Harp, MD;  Location: [facility];  Service: Cardiovascular;  Laterality: N/A;   NEPHRECTOMY Right 08/2021   TONSILLECTOMY       Social History:   reports that she has been smoking cigarettes. She has a 114.00 pack-year smoking history. She has never used smokeless tobacco. She reports that she does not currently use alcohol. She reports that she does not currently use drugs after having used the following drugs: Cocaine, Benzodiazepines,  Amphetamines, "Crack" cocaine, Marijuana, Opium, Methylphenidate, and Heroin.   Family History:  Her family history includes Diabetes in her maternal uncle; Hyperlipidemia in her mother; Hypertension in her brother and mother.   Allergies Allergies  Allergen Reactions   Glimepiride Anaphylaxis    Has sulfa in it per pharmacy   Sulfa Antibiotics Anaphylaxis and Hives   Effexor [Venlafaxine] Hives     Home Medications  Prior to Admission medications   Medication Sig Start Date End Date Taking? Authorizing Provider   albuterol (VENTOLIN HFA) 108 (90 Base) MCG/ACT inhaler Inhale 2 puffs into the lungs every 6 (six) hours as needed for wheezing or shortness of breath.    [provider]  amLODipine (NORVASC) 10 MG tablet TAKE 1 TABLET BY MOUTH ONCE DAILY 04/12/20   Tobb, Kardie, DO  aspirin 81 MG EC tablet Take 1 tablet (81 mg total) by mouth daily. 02/20/19   Cheryln Manly, NP  atorvastatin (LIPITOR) 80 MG tablet Take 1 tablet (80 mg total) by mouth daily at 6 PM. 02/20/19   Cheryln Manly, NP  dapagliflozin propanediol (FARXIGA) 10 MG TABS tablet Take 10 mg by mouth daily.    [provider]  gabapentin (NEURONTIN) 400 MG capsule Take 400 mg by mouth 3 (three) times daily.    [provider]  ibuprofen (ADVIL) 800 MG tablet Take 1 tablet by mouth every 6 (six) hours as needed for fever, headache, mild pain, moderate pain or cramping. 02/04/20   [provider]  lisinopril (ZESTRIL) 40 MG tablet Take 40 mg by mouth daily.    [provider]  metFORMIN (GLUCOPHAGE) 1000 MG tablet Take 1 tablet (1,000 mg total) by mouth 2 (two) times daily with a meal. 02/20/19   Cheryln Manly, NP  metoprolol tartrate (LOPRESSOR) 50 MG tablet Take 1 tablet (50 mg total) by mouth 2 (two) times daily. 02/20/19   Cheryln Manly, NP  nitroGLYCERIN (NITROSTAT) 0.4 MG SL tablet Place 1 tablet (0.4 mg total) under the tongue every 5 (five) minutes x 3 doses as needed for chest pain. 02/20/19   Cheryln Manly, NP  ondansetron (ZOFRAN-ODT) 4 MG disintegrating tablet Take 1 tablet by mouth every 8 (eight) hours as needed for nausea, vomiting or refractory nausea / vomiting.    [provider]     Critical care time: 45 minutes    The patient is critically ill with multiple organ systems failure and requires high complexity decision making for assessment and support, frequent evaluation and titration of therapies, application of advanced monitoring technologies and  extensive interpretation of multiple databases.  Independent Critical Care Time: 110 Minutes.   Rodman Pickle, M.D. [facility] Pulmonary/Critical Care Medicine 11/13/2021 9:38 AM   Please see [place] for pager number to reach on-call Pulmonary and Critical Care Team.

## 2021-11-13 NOTE — Progress Notes (Signed)
Aline attempted x 4 with 2 RT's attempting without success. Dr. Loanne Drilling was made aware.

## 2021-11-13 NOTE — Procedures (Signed)
Patient Name: Kristen Murphy  MRN: [id number]  Epilepsy Attending: Lora Havens  Referring Physician/Provider: Kerney Elbe, MD Date: 11/13/2021 Duration: 22.22 mins  Patient history: 56 year old woman with a history of cervical cancer, status post nephrectomy 2 months ago, colostomy, CAD, diabetes, depression, hypertension, remote history of stroke without residual deficits who was brought in by EMS for altered mental status.  EEG to evaluate for seizure.  Level of alertness: obtunded  AEDs during EEG study: versed, propofol, LEV  Technical aspects: This EEG study was done with scalp electrodes positioned according to the 10-20 International system of electrode placement. Electrical activity was acquired at a sampling rate of 500Hz  and reviewed with a high frequency filter of 70Hz  and a low frequency filter of 1Hz . EEG data were recorded continuously and digitally stored.   Description: EEG showed continuous generalized background attenuation.  Hyperventilation and photic stimulation were not performed.     Of note, study was technically difficult due to significant myogenic artifact  ABNORMALITY -Background attenuation, generalized  IMPRESSION: This technically difficult study is suggestive of profound degree of encephalopathy, nonspecific etiology.  No seizures or epileptiform discharges were seen throughout the recording.  [name] [name]

## 2021-11-13 NOTE — Progress Notes (Signed)
Pharmacy Antibiotic Note  Kristen Murphy is a 56 y.o. female admitted on 11/12/2021 with r/o meningitis.  Pharmacy has been consulted for Vancomycin and acyclovir dosing.  Actual body weight = 75.6 kg, adjusted weight ~ 58 kg.  Wbc elevated, SCr 1.1. CrCl ~ 54 mL/min  Plan: -Ceftriaxone 2 gm IV Q 12 hours per MD -Vancomycin 1500 mg IV load followed by vancomycin 750 mg IV Q 12 hours per traditional nomogram. Aim for goal trough of 15-20 mcg/mL -Acyclovir 10 mg/kg Q 8 hours (dose based on adjusted body weight ~ 60 kg) -Monitor CBC, renal fx, cultures and clinical progress -Vanc levels as indicated   Height: 5\' 2"  (157.5 cm) Weight: 75.6 kg (166 lb 10.7 oz) IBW/kg (Calculated) : 50.1  Temp (24hrs), Avg:100.3 F (37.9 C), Min:97.9 F (36.6 C), Max:102.9 F (39.4 C)  Recent Labs  Lab 11/12/21 1508 11/12/21 1514 11/12/21 1518 11/12/21 1931 11/12/21 2233 11/13/21 0228  WBC 18.6*  --   --   --   --  30.2*  CREATININE 1.26* 1.10*  --   --   --  1.10*  LATICACIDVEN  --   --  1.5 2.5* 1.9  --      Estimated Creatinine Clearance: 54.4 mL/min (A) (by C-G formula based on SCr of 1.1 mg/dL (H)).    Allergies  Allergen Reactions   Glimepiride Anaphylaxis    Has sulfa in it per pharmacy   Sulfa Antibiotics Anaphylaxis and Hives   Effexor [Venlafaxine] Hives    Antimicrobials this admission: Ceftriaxone 2/19 >>  Vancomycin 2/19 >>  Acyclovir 2/19 >>   Dose adjustments this admission:   Microbiology results: 2/19 BCx:  2/20 Resp Cx > pending 2/19 MRSA PCR > detected  Thank you for allowing pharmacy to be a part of this patients care.  [place] Crane, Roylene Reason, BCCP Clinical Pharmacist  11/13/2021 1:48 PM   [facility] pharmacy phone numbers are listed on [email or web address]

## 2021-11-13 NOTE — Progress Notes (Signed)
Subjective: Copious red blood output via OG tube yesterday after PEA arrest. GI suspects excessive ibuprofen and salicylate use leading to UGIB. Endoscopy completed yesterday night.   Objective: Current vital signs: BP (!) 97/50    Pulse 90    Temp 99 F (37.2 C) (Bladder)    Resp 18    Ht _0  (1.575 m)    Wt 75.6 kg    SpO2 99%    BMI 30.48 kg/m  Vital signs in last 24 hours: Temp:  [97.9 F (36.6 C)-102.9 F (39.4 C)] 99 F (37.2 C) (02/20 0800) Pulse Rate:  [82-128] 90 (02/20 0800) Resp:  [8-79] 18 (02/20 0800) BP: (85-199)/(47-181) 97/50 (02/20 0800) SpO2:  [0 %-100 %] 99 % (02/20 0800) FiO2 (%):  [100 %] 100 % (02/20 0732) Weight:  [75.6 kg] 75.6 kg (02/19 1536)  Intake/Output from previous day: 02/19 0701 - 02/20 0700 In: 3870.6 [I.V.:1299.6; IV Piggyback:2571] Out: 1110 [Urine:1010; Stool:100] Intake/Output this shift: No intake/output data recorded. Nutritional status:  Diet Order             Diet NPO time specified  Diet effective now                  HEENT: [place]/AT. No nuchal rigidity.  Lungs: Intubated Ext: Cool, mottled skin. On cooling pads.   Neurologic Exam: Performed while under heavy sedation with propofol, Versed and Fentanyl drips (30/15/150) Ment: No responses to any external stimuli. Eyes remain closed during exam. Not alerting to voice.  CN: In the context of sedation, pupils are pinpoint. No blink to threat. No doll's eye reflex.  No blink to eyelid stimulation. Does initiate breaths on the vent.  Face flaccidly symmetric.  Motor/Sensory: (under heavy sedation): Flaccid tone BUE with no movement to noxious. Internally rotates shoulders synchronously with the vent. BLE with increased extensor tone. No movement to noxious.  Reflexes: Brisk low amplitude patellar reflexes. No clonus with passive dorsiflexion at ankles.  Cerebellar/Gait: Unable to assess  Lab Results: Results for orders placed or performed during the hospital encounter of 11/12/21  (from the past 48 hour(s))  Protime-INR     Status: None   Collection Time: 11/12/21  3:08 PM  Result Value Ref Range   Prothrombin Time 15.0 11.4 - 15.2 seconds   INR 1.2 0.8 - 1.2    Comment: (NOTE) INR goal varies based on device and disease states. Performed at [facility], [place] [address] [place], [place] [address]   APTT     Status: None   Collection Time: 11/12/21  3:08 PM  Result Value Ref Range   aPTT 27 24 - 36 seconds    Comment: Performed at [facility] [address], [place], [place] [address]  CBC     Status: Abnormal   Collection Time: 11/12/21  3:08 PM  Result Value Ref Range   WBC 18.6 (H) 4.0 - 10.5 K/uL   RBC 3.32 (L) 3.87 - 5.11 MIL/uL   Hemoglobin 8.9 (L) 12.0 - 15.0 g/dL   HCT 28.9 (L) 36.0 - 46.0 %   MCV 87.0 80.0 - 100.0 fL   MCH 26.8 26.0 - 34.0 pg   MCHC 30.8 30.0 - 36.0 g/dL   RDW 16.0 (H) 11.5 - 15.5 %   Platelets 251 150 - 400 K/uL   nRBC 0.2 0.0 - 0.2 %    Comment: Performed at [facility], [place] [address] [place], [place] [address]  Differential  Status: Abnormal   Collection Time: 11/12/21  3:08 PM  Result Value Ref Range   Neutrophils Relative % 89 %   Neutro Abs 16.7 (H) 1.7 - 7.7 K/uL   Lymphocytes Relative 5 %   Lymphs Abs 0.8 0.7 - 4.0 K/uL   Monocytes Relative 4 %   Monocytes Absolute 0.7 0.1 - 1.0 K/uL   Eosinophils Relative 0 %   Eosinophils Absolute 0.0 0.0 - 0.5 K/uL   Basophils Relative 0 %   Basophils Absolute 0.1 0.0 - 0.1 K/uL   Immature Granulocytes 2 %   Abs Immature Granulocytes 0.27 (H) 0.00 - 0.07 K/uL    Comment: Performed at [facility] [address] [place], [place] [address]  Comprehensive metabolic panel     Status: Abnormal   Collection Time: 11/12/21  3:08 PM  Result Value Ref Range   Sodium 126 (L) 135 - 145 mmol/L   Potassium 3.0 (L) 3.5 - 5.1 mmol/L   Chloride 93 (L) 98 - 111 mmol/L   CO2 20 (L) 22 - 32 mmol/L   Glucose, Bld 417 (H) 70 - 99 mg/dL    Comment: Glucose  reference range applies only to samples taken after fasting for at least 8 hours.   BUN 16 6 - 20 mg/dL   Creatinine, Ser 1.26 (H) 0.44 - 1.00 mg/dL   Calcium 7.9 (L) 8.9 - 10.3 mg/dL   Total Protein 7.0 6.5 - 8.1 g/dL   Albumin 2.5 (L) 3.5 - 5.0 g/dL   AST 12 (L) 15 - 41 U/L   ALT 12 0 - 44 U/L   Alkaline Phosphatase 58 38 - 126 U/L   Total Bilirubin 0.3 0.3 - 1.2 mg/dL   GFR, Estimated 50 (L) >60 mL/min    Comment: (NOTE) Calculated using the CKD-EPI Creatinine Equation (2021)    Anion gap 13 5 - 15    Comment: Performed at [facility], [place] [address], [place], [place] [address]  ABO/Rh     Status: None   Collection Time: 11/12/21  3:08 PM  Result Value Ref Range   ABO/RH(D)      O POS Performed at [facility] [address], [place], [place] [address]   I-Stat beta hCG blood, ED     Status: None   Collection Time: 11/12/21  3:13 PM  Result Value Ref Range   I-stat hCG, quantitative <5.0 <5 mIU/mL   Comment 3            Comment:   GEST. AGE      CONC.  (mIU/mL)   <=1 WEEK        5 - 50     2 WEEKS       50 - 500     3 WEEKS       100 - 10,000     4 WEEKS     1,000 - 30,000        FEMALE AND NON-PREGNANT FEMALE:     LESS THAN 5 mIU/mL   I-stat chem 8, ED     Status: Abnormal   Collection Time: 11/12/21  3:14 PM  Result Value Ref Range   Sodium 129 (L) 135 - 145 mmol/L   Potassium 3.1 (L) 3.5 - 5.1 mmol/L   Chloride 97 (L) 98 - 111 mmol/L   BUN 15 6 - 20 mg/dL   Creatinine, Ser 1.10 (H) 0.44 - 1.00 mg/dL   Glucose, Bld 412 (H) 70 -  99 mg/dL    Comment: Glucose reference range applies only to samples taken after fasting for at least 8 hours.   Calcium, Ion 1.02 (L) 1.15 - 1.40 mmol/L   TCO2 22 22 - 32 mmol/L   Hemoglobin 9.9 (L) 12.0 - 15.0 g/dL   HCT 29.0 (L) 36.0 - 46.0 %  Lactic acid, plasma     Status: None   Collection Time: 11/12/21  3:18 PM  Result Value Ref Range   Lactic Acid, Venous 1.5 0.5 - 1.9 mmol/L    Comment: Performed at [facility] [address] [place], [place] [address]  Troponin I (High Sensitivity)     Status: Abnormal   Collection Time: 11/12/21  3:19 PM  Result Value Ref Range   Troponin I (High Sensitivity) 40 (H) <18 ng/L    Comment: (NOTE) Elevated high sensitivity troponin I (hsTnI) values and significant  changes across serial measurements may suggest ACS but many other  chronic and acute conditions are known to elevate hsTnI results.  Refer to the "Links" section for chest pain algorithms and additional  guidance. Performed at [facility], [place] [address], [place], [place] [address]   Ethanol     Status: None   Collection Time: 11/12/21  3:21 PM  Result Value Ref Range   Alcohol, Ethyl (B) <10 <10 mg/dL    Comment: (NOTE) Lowest detectable limit for serum alcohol is 10 mg/dL.  For medical purposes only. Performed at [facility], [place] [address] [place], [place] [address]   Beta-hydroxybutyric acid     Status: None   Collection Time: 11/12/21  3:21 PM  Result Value Ref Range   Beta-Hydroxybutyric Acid 0.22 0.05 - 0.27 mmol/L    Comment: Performed at [facility] [address], [place], [place] [address]  Resp Panel by RT-PCR (Flu A&B, Covid) Nasopharyngeal Swab     Status: None   Collection Time: 11/12/21  4:25 PM   Specimen: Nasopharyngeal Swab; Nasopharyngeal(NP) swabs in vial transport medium  Result Value Ref Range   SARS Coronavirus 2 by RT PCR NEGATIVE NEGATIVE    Comment: (NOTE) SARS-CoV-2 target nucleic acids are NOT DETECTED.  The SARS-CoV-2 RNA is generally detectable in upper respiratory specimens during the acute phase of infection. The lowest concentration of SARS-CoV-2 viral copies this assay can detect is 138 copies/mL. A negative result does not preclude SARS-Cov-2 infection and should not be used as the sole basis for treatment or other patient management decisions. A negative result may occur with  improper specimen collection/handling,  submission of specimen other than nasopharyngeal swab, presence of viral mutation(s) within the areas targeted by this assay, and inadequate number of viral copies(<138 copies/mL). A negative result must be combined with clinical observations, patient history, and epidemiological information. The expected result is Negative.  Fact Sheet for Patients:  EntrepreneurPulse.com.au  Fact Sheet for Healthcare Providers:  IncredibleEmployment.be  This test is no t yet approved or cleared by the [place] FDA and  has been authorized for detection and/or diagnosis of SARS-CoV-2 by FDA under an Emergency Use Authorization (EUA). This EUA will remain  in effect (meaning this test can be used) for the duration of the COVID-19 declaration under Section 564(b)(1) of the Act, 21 U.S.C.section 360bbb-3(b)(1), unless the authorization is terminated  or revoked sooner.       Influenza A by PCR NEGATIVE NEGATIVE   Influenza B by PCR NEGATIVE NEGATIVE    Comment: (NOTE)  The Xpert Xpress SARS-CoV-2/FLU/RSV plus assay is intended as an aid in the diagnosis of influenza from Nasopharyngeal swab specimens and should not be used as a sole basis for treatment. Nasal washings and aspirates are unacceptable for Xpert Xpress SARS-CoV-2/FLU/RSV testing.  Fact Sheet for Patients: EntrepreneurPulse.com.au  Fact Sheet for Healthcare Providers: IncredibleEmployment.be  This test is not yet approved or cleared by the [place] FDA and has been authorized for detection and/or diagnosis of SARS-CoV-2 by FDA under an Emergency Use Authorization (EUA). This EUA will remain in effect (meaning this test can be used) for the duration of the COVID-19 declaration under Section 564(b)(1) of the Act, 21 U.S.C. section 360bbb-3(b)(1), unless the authorization is terminated or revoked.  Performed at [facility], [place] [address],  [place], [place] [address]   Troponin I (High Sensitivity)     Status: Abnormal   Collection Time: 11/12/21  5:19 PM  Result Value Ref Range   Troponin I (High Sensitivity) 41 (H) <18 ng/L    Comment: (NOTE) Elevated high sensitivity troponin I (hsTnI) values and significant  changes across serial measurements may suggest ACS but many other  chronic and acute conditions are known to elevate hsTnI results.  Refer to the "Links" section for chest pain algorithms and additional  guidance. Performed at [facility], [place] [address], [place], [place] [address]   Prepare RBC (crossmatch)     Status: None   Collection Time: 11/12/21  5:48 PM  Result Value Ref Range   Order Confirmation      ORDER PROCESSED BY BLOOD BANK Performed at [facility], [place] [address], [place], [place] [address]   Urinalysis, Routine w reflex microscopic     Status: Abnormal   Collection Time: 11/12/21  5:54 PM  Result Value Ref Range   Color, Urine YELLOW YELLOW   APPearance CLEAR CLEAR   Specific Gravity, Urine <1.005 (L) 1.005 - 1.030   pH 6.0 5.0 - 8.0   Glucose, UA 250 (A) NEGATIVE mg/dL   Hgb urine dipstick MODERATE (A) NEGATIVE   Bilirubin Urine NEGATIVE NEGATIVE   Ketones, ur NEGATIVE NEGATIVE mg/dL   Protein, ur NEGATIVE NEGATIVE mg/dL   Nitrite NEGATIVE NEGATIVE   Leukocytes,Ua LARGE (A) NEGATIVE    Comment: Performed at [facility] [address] [place], [place] [address]  Rapid urine drug screen (hospital performed)     Status: Abnormal   Collection Time: 11/12/21  5:54 PM  Result Value Ref Range   Opiates NONE DETECTED NONE DETECTED   Cocaine NONE DETECTED NONE DETECTED   Benzodiazepines POSITIVE (A) NONE DETECTED   Amphetamines NONE DETECTED NONE DETECTED   Tetrahydrocannabinol NONE DETECTED NONE DETECTED   Barbiturates NONE DETECTED NONE DETECTED    Comment: (NOTE) DRUG SCREEN FOR MEDICAL PURPOSES ONLY.  IF CONFIRMATION IS NEEDED FOR ANY PURPOSE, NOTIFY LAB WITHIN 5  DAYS.  LOWEST DETECTABLE LIMITS FOR URINE DRUG SCREEN Drug Class                     Cutoff (ng/mL) Amphetamine and metabolites    1000 Barbiturate and metabolites    200 Benzodiazepine                 357 Tricyclics and metabolites     300 Opiates and metabolites        300 Cocaine and metabolites        300 THC  50 Performed at [facility], [place] [address] [place], [place] [address]   Urinalysis, Microscopic (reflex)     Status: Abnormal   Collection Time: 11/12/21  5:54 PM  Result Value Ref Range   RBC / HPF 11-20 0 - 5 RBC/hpf   WBC, UA 11-20 0 - 5 WBC/hpf   Bacteria, UA FEW (A) NONE SEEN   Squamous Epithelial / LPF 0-5 0 - 5    Comment: Performed at [facility], [place] [address], [place], [place] [address]  I-Stat venous blood gas, ED     Status: Abnormal   Collection Time: 11/12/21  5:59 PM  Result Value Ref Range   pH, Ven 7.294 7.25 - 7.43   pCO2, Ven 48.9 44 - 60 mmHg   pO2, Ven 39 32 - 45 mmHg   Bicarbonate 23.7 20.0 - 28.0 mmol/L   TCO2 25 22 - 32 mmol/L   O2 Saturation 67 %   Acid-base deficit 3.0 (H) 0.0 - 2.0 mmol/L   Sodium 134 (L) 135 - 145 mmol/L   Potassium 3.3 (L) 3.5 - 5.1 mmol/L   Calcium, Ion 1.05 (L) 1.15 - 1.40 mmol/L   HCT 29.0 (L) 36.0 - 46.0 %   Hemoglobin 9.9 (L) 12.0 - 15.0 g/dL   Sample type VENOUS    Comment NOTIFIED PHYSICIAN   Type and screen Ordered by PROVIDER DEFAULT     Status: None (Preliminary result)   Collection Time: 11/12/21  6:02 PM  Result Value Ref Range   ABO/RH(D) O POS    Antibody Screen NEG    Sample Expiration 11/15/2021,2359    Unit Number [id number]    Blood Component Type RED CELLS,LR    Unit division 00    Status of Unit ISSUED    Unit tag comment VERBAL ORDERS PER DR DR.TRIFAN    Transfusion Status OK TO TRANSFUSE    Crossmatch Result COMPATIBLE    Unit Number [id number]    Blood Component Type RBC LR PHER1    Unit division 00    Status of Unit ISSUED    Unit  tag comment VERBAL ORDERS PER DR DR.TRIFAN    Transfusion Status OK TO TRANSFUSE    Crossmatch Result      COMPATIBLE Performed at [facility], [address], [place], [place] [address]   Troponin I (High Sensitivity)     Status: Abnormal   Collection Time: 11/12/21  6:10 PM  Result Value Ref Range   Troponin I (High Sensitivity) 63 (H) <18 ng/L    Comment: M.MCIVER,RN 11/12/21 AT 1903 A.HUGHES (NOTE) Elevated high sensitivity troponin I (hsTnI) values and significant  changes across serial measurements may suggest ACS but many other  chronic and acute conditions are known to elevate hsTnI results.  Refer to the Links section for chest pain algorithms and additional  guidance. Performed at [facility], [place] [address], [place], [place] [address]   D-dimer, quantitative     Status: Abnormal   Collection Time: 11/12/21  6:10 PM  Result Value Ref Range   D-Dimer, Quant 2.36 (H) 0.00 - 0.50 ug/mL-FEU    Comment: (NOTE) At the manufacturer cut-off value of 0.5 g/mL FEU, this assay has a negative predictive value of 95-100%.This assay is intended for use in conjunction with a clinical pretest probability (PTP) assessment model to exclude pulmonary embolism (PE) and deep venous thrombosis (DVT) in outpatients suspected of PE or DVT. Results should be correlated with clinical  presentation. Performed at [facility], [place] [address] [place], [place] [address]   Hemoglobin and hematocrit, blood     Status: Abnormal   Collection Time: 11/12/21  6:10 PM  Result Value Ref Range   Hemoglobin 8.7 (L) 12.0 - 15.0 g/dL   HCT 29.5 (L) 36.0 - 46.0 %    Comment: Performed at [facility], [place] [address] [place], [place] [address]  HIV Antibody (routine testing w rflx)     Status: None   Collection Time: 11/12/21  7:31 PM  Result Value Ref Range   HIV Screen 4th Generation wRfx Non Reactive Non Reactive    Comment: Performed at [facility], [place] [address], [place], [place] [address]  Lactic acid, plasma     Status: Abnormal   Collection Time: 11/12/21  7:31 PM  Result Value Ref Range   Lactic Acid, Venous 2.5 (HH) 0.5 - 1.9 mmol/L    Comment: CRITICAL RESULT CALLED TO, READ BACK BY AND VERIFIED WITH: [place] RN 11/12/21 2104 Wiliam Ke Performed at [facility], [place] [address] [place],  [address]   Procalcitonin     Status: None   Collection Time: 11/12/21  7:31 PM  Result Value Ref Range   Procalcitonin 2.04 ng/mL    Comment:        Interpretation: PCT > 2 ng/mL: Systemic infection (sepsis) is likely, unless other causes are known. (NOTE)       Sepsis PCT Algorithm           Lower Respiratory Tract                                      Infection PCT Algorithm    ----------------------------     ----------------------------         PCT < 0.25 ng/mL                PCT < 0.10 ng/mL          Strongly encourage             Strongly discourage   discontinuation of antibiotics    initiation of antibiotics    ----------------------------     -----------------------------       PCT 0.25 - 0.50 ng/mL            PCT 0.10 - 0.25 ng/mL               OR       >80% decrease in PCT            Discourage initiation of                                            antibiotics      Encourage discontinuation           of antibiotics    ----------------------------     -----------------------------         PCT >= 0.50 ng/mL              PCT 0.26 - 0.50 ng/mL               AND       <80% decrease in PCT  Encourage initiation of                                             antibiotics       Encourage continuation           of antibiotics    ----------------------------     -----------------------------        PCT >= 0.50 ng/mL                  PCT > 0.50 ng/mL               AND         increase in PCT                  Strongly encourage                                      initiation of antibiotics    Strongly  encourage escalation           of antibiotics                                     -----------------------------                                           PCT <= 0.25 ng/mL                                                 OR                                        > 80% decrease in PCT                                      Discontinue / Do not initiate                                             antibiotics  Performed at [facility], [address], [place], [place] [address]   Magnesium     Status: None   Collection Time: 11/12/21  7:31 PM  Result Value Ref Range   Magnesium 2.0 1.7 - 2.4 mg/dL    Comment: Performed at [facility], [place] [address], [place], [place] [address]  Phosphorus     Status: Abnormal   Collection Time: 11/12/21  7:31 PM  Result Value Ref Range   Phosphorus 5.7 (H) 2.5 - 4.6 mg/dL    Comment: Performed at [facility] [address] [place], [place] [address]  Hemoglobin A1c     Status: Abnormal   Collection Time: 11/12/21  7:31  PM  Result Value Ref Range   Hgb A1c MFr Bld 7.2 (H) 4.8 - 5.6 %    Comment: (NOTE) Pre diabetes:          5.7%-6.4%  Diabetes:              >6.4%  Glycemic control for   <7.0% adults with diabetes    Mean Plasma Glucose 159.94 mg/dL    Comment: Performed at [facility] [address], [place], [place] [address]  Acetaminophen level     Status: Abnormal   Collection Time: 11/12/21  7:31 PM  Result Value Ref Range   Acetaminophen (Tylenol), Serum <10 (L) 10 - 30 ug/mL    Comment: (NOTE) Therapeutic concentrations vary significantly. A range of 10-30 ug/mL  may be an effective concentration for many patients. However, some  are best treated at concentrations outside of this range. Acetaminophen concentrations >150 ug/mL at 4 hours after ingestion  and >50 ug/mL at 12 hours after ingestion are often associated with  toxic reactions.  Performed at [facility], [place] [address]  [place], [place] [address]   Salicylate level     Status: Abnormal   Collection Time: 11/12/21  7:31 PM  Result Value Ref Range   Salicylate Lvl <1.4 (L) 7.0 - 30.0 mg/dL    Comment: Performed at [facility] [address], [place], [place] [address]  Hemoglobin and hematocrit, blood     Status: Abnormal   Collection Time: 11/12/21  7:31 PM  Result Value Ref Range   Hemoglobin 11.7 (L) 12.0 - 15.0 g/dL    Comment: REPEATED TO VERIFY   HCT 37.7 36.0 - 46.0 %    Comment: Performed at [facility] [address], [place],  [address]  Troponin I (High Sensitivity)     Status: Abnormal   Collection Time: 11/12/21  7:31 PM  Result Value Ref Range   Troponin I (High Sensitivity) 59 (H) <18 ng/L    Comment: (NOTE) Elevated high sensitivity troponin I (hsTnI) values and significant  changes across serial measurements may suggest ACS but many other  chronic and acute conditions are known to elevate hsTnI results.  Refer to the Links section for chest pain algorithms and additional  guidance. Performed at [facility], [place] [address] [place],  [address]   MRSA Next Gen by PCR, Nasal     Status: Abnormal   Collection Time: 11/12/21  7:36 PM   Specimen: Nasal Mucosa; Nasal Swab  Result Value Ref Range   MRSA by PCR Next Gen DETECTED (A) NOT DETECTED    Comment: RESULT CALLED TO, READ BACK BY AND VERIFIED WITH: V [email or web address],RN_[email or web address]  11/12/21 [place] (NOTE) The GeneXpert MRSA Assay (FDA approved for NASAL specimens only), is one component of a comprehensive MRSA colonization surveillance program. It is not intended to diagnose MRSA infection nor to guide or monitor treatment for MRSA infections. Test performance is not FDA approved in patients less than 28 years old. Performed at [facility], [place] [address] [place], [place] [address]   I-STAT 7, (LYTES, BLD GAS, ICA, H+H)     Status: Abnormal   Collection Time: 11/12/21  7:46 PM  Result Value Ref Range    pH, Arterial 7.212 (L) 7.35 - 7.45   pCO2 arterial 74.0 (HH) 32 - 48 mmHg   pO2, Arterial 140 (H) 83 - 108 mmHg   Bicarbonate 29.1 (H) 20.0 - 28.0 mmol/L  TCO2 31 22 - 32 mmol/L   O2 Saturation 98 %   Acid-Base Excess 0.0 0.0 - 2.0 mmol/L   Sodium 131 (L) 135 - 145 mmol/L   Potassium 4.0 3.5 - 5.1 mmol/L   Calcium, Ion 1.10 (L) 1.15 - 1.40 mmol/L   HCT 36.0 36.0 - 46.0 %   Hemoglobin 12.2 12.0 - 15.0 g/dL   Patient temperature 38.6 C    Collection site RADIAL, ALLEN'S TEST ACCEPTABLE    Drawn by Operator    Sample type ARTERIAL    Comment NOTIFIED PHYSICIAN   Glucose, capillary     Status: Abnormal   Collection Time: 11/12/21  9:02 PM  Result Value Ref Range   Glucose-Capillary 323 (H) 70 - 99 mg/dL    Comment: Glucose reference range applies only to samples taken after fasting for at least 8 hours.  Lactic acid, plasma     Status: None   Collection Time: 11/12/21 10:33 PM  Result Value Ref Range   Lactic Acid, Venous 1.9 0.5 - 1.9 mmol/L    Comment: Performed at [facility] [address], [place], [place] [address]  Hemoglobin and hematocrit, blood     Status: Abnormal   Collection Time: 11/12/21 10:33 PM  Result Value Ref Range   Hemoglobin 9.3 (L) 12.0 - 15.0 g/dL   HCT 31.3 (L) 36.0 - 46.0 %    Comment: Performed at [facility], [place] [address] [place], [place] [address]  Glucose, capillary     Status: Abnormal   Collection Time: 11/13/21 12:14 AM  Result Value Ref Range   Glucose-Capillary 150 (H) 70 - 99 mg/dL    Comment: Glucose reference range applies only to samples taken after fasting for at least 8 hours.  Culture, Respiratory w Gram Stain     Status: None (Preliminary result)   Collection Time: 11/13/21  2:09 AM   Specimen: Bronchoalveolar Lavage; Respiratory  Result Value Ref Range   Specimen Description BRONCHIAL ALVEOLAR LAVAGE    Special Requests NONE    Gram Stain      MODERATE WBC PRESENT, PREDOMINANTLY PMN FEW GRAM POSITIVE COCCI IN  PAIRS AND CHAINS Performed at [facility], [address] [place], [place] [address]    Culture PENDING    Report Status PENDING   Triglycerides     Status: Abnormal   Collection Time: 11/13/21  2:28 AM  Result Value Ref Range   Triglycerides 314 (H) <150 mg/dL    Comment: Performed at [facility] [address], [place], [place] [address]  CBC     Status: Abnormal   Collection Time: 11/13/21  2:28 AM  Result Value Ref Range   WBC 30.2 (H) 4.0 - 10.5 K/uL   RBC 3.90 3.87 - 5.11 MIL/uL   Hemoglobin 10.7 (L) 12.0 - 15.0 g/dL   HCT 35.1 (L) 36.0 - 46.0 %   MCV 90.0 80.0 - 100.0 fL   MCH 27.4 26.0 - 34.0 pg   MCHC 30.5 30.0 - 36.0 g/dL   RDW 16.1 (H) 11.5 - 15.5 %   Platelets 214 150 - 400 K/uL   nRBC 0.1 0.0 - 0.2 %    Comment: Performed at [facility], [place] [address] [place], [place] [address]  Comprehensive metabolic panel     Status: Abnormal   Collection Time: 11/13/21  2:28 AM  Result Value Ref Range   Sodium 135 135 - 145 mmol/L   Potassium  3.8 3.5 - 5.1 mmol/L   Chloride 99 98 - 111 mmol/L   CO2 26 22 - 32 mmol/L   Glucose, Bld 125 (H) 70 - 99 mg/dL    Comment: Glucose reference range applies only to samples taken after fasting for at least 8 hours.   BUN 14 6 - 20 mg/dL   Creatinine, Ser 1.10 (H) 0.44 - 1.00 mg/dL   Calcium 7.8 (L) 8.9 - 10.3 mg/dL   Total Protein 6.4 (L) 6.5 - 8.1 g/dL   Albumin 2.3 (L) 3.5 - 5.0 g/dL   AST 84 (H) 15 - 41 U/L   ALT 51 (H) 0 - 44 U/L   Alkaline Phosphatase 65 38 - 126 U/L   Total Bilirubin 0.2 (L) 0.3 - 1.2 mg/dL   GFR, Estimated 59 (L) >60 mL/min    Comment: (NOTE) Calculated using the CKD-EPI Creatinine Equation (2021)    Anion gap 10 5 - 15    Comment: Performed at [facility] [address] [place], [place] [address]  Glucose, capillary     Status: Abnormal   Collection Time: 11/13/21  3:43 AM  Result Value Ref Range   Glucose-Capillary 107 (H) 70 - 99 mg/dL    Comment: Glucose reference  range applies only to samples taken after fasting for at least 8 hours.  I-STAT 7, (LYTES, BLD GAS, ICA, H+H)     Status: Abnormal   Collection Time: 11/13/21  3:53 AM  Result Value Ref Range   pH, Arterial 7.255 (L) 7.35 - 7.45   pCO2 arterial 63.0 (H) 32 - 48 mmHg   pO2, Arterial 69 (L) 83 - 108 mmHg   Bicarbonate 28.0 20.0 - 28.0 mmol/L   TCO2 30 22 - 32 mmol/L   O2 Saturation 90 %   Acid-Base Excess 0.0 0.0 - 2.0 mmol/L   Sodium 134 (L) 135 - 145 mmol/L   Potassium 3.8 3.5 - 5.1 mmol/L   Calcium, Ion 1.14 (L) 1.15 - 1.40 mmol/L   HCT 34.0 (L) 36.0 - 46.0 %   Hemoglobin 11.6 (L) 12.0 - 15.0 g/dL   Patient temperature 37.0 C    Collection site RADIAL, ALLEN'S TEST ACCEPTABLE    Drawn by Operator    Sample type ARTERIAL   Glucose, capillary     Status: Abnormal   Collection Time: 11/13/21  7:34 AM  Result Value Ref Range   Glucose-Capillary 110 (H) 70 - 99 mg/dL    Comment: Glucose reference range applies only to samples taken after fasting for at least 8 hours.    Recent Results (from the past 240 hour(s))  Resp Panel by RT-PCR (Flu A&B, Covid) Nasopharyngeal Swab     Status: None   Collection Time: 11/12/21  4:25 PM   Specimen: Nasopharyngeal Swab; Nasopharyngeal(NP) swabs in vial transport medium  Result Value Ref Range Status   SARS Coronavirus 2 by RT PCR NEGATIVE NEGATIVE Final    Comment: (NOTE) SARS-CoV-2 target nucleic acids are NOT DETECTED.  The SARS-CoV-2 RNA is generally detectable in upper respiratory specimens during the acute phase of infection. The lowest concentration of SARS-CoV-2 viral copies this assay can detect is 138 copies/mL. A negative result does not preclude SARS-Cov-2 infection and should not be used as the sole basis for treatment or other patient management decisions. A negative result may occur with  improper specimen collection/handling, submission of specimen other than nasopharyngeal swab, presence of viral mutation(s) within the areas  targeted by this assay, and inadequate  number of viral copies(<138 copies/mL). A negative result must be combined with clinical observations, patient history, and epidemiological information. The expected result is Negative.  Fact Sheet for Patients:  EntrepreneurPulse.com.au  Fact Sheet for Healthcare Providers:  IncredibleEmployment.be  This test is no t yet approved or cleared by the [place] FDA and  has been authorized for detection and/or diagnosis of SARS-CoV-2 by FDA under an Emergency Use Authorization (EUA). This EUA will remain  in effect (meaning this test can be used) for the duration of the COVID-19 declaration under Section 564(b)(1) of the Act, 21 U.S.C.section 360bbb-3(b)(1), unless the authorization is terminated  or revoked sooner.       Influenza A by PCR NEGATIVE NEGATIVE Final   Influenza B by PCR NEGATIVE NEGATIVE Final    Comment: (NOTE) The Xpert Xpress SARS-CoV-2/FLU/RSV plus assay is intended as an aid in the diagnosis of influenza from Nasopharyngeal swab specimens and should not be used as a sole basis for treatment. Nasal washings and aspirates are unacceptable for Xpert Xpress SARS-CoV-2/FLU/RSV testing.  Fact Sheet for Patients: EntrepreneurPulse.com.au  Fact Sheet for Healthcare Providers: IncredibleEmployment.be  This test is not yet approved or cleared by the [place] FDA and has been authorized for detection and/or diagnosis of SARS-CoV-2 by FDA under an Emergency Use Authorization (EUA). This EUA will remain in effect (meaning this test can be used) for the duration of the COVID-19 declaration under Section 564(b)(1) of the Act, 21 U.S.C. section 360bbb-3(b)(1), unless the authorization is terminated or revoked.  Performed at [facility], [place] [address], [place], [place] [address]   MRSA Next Gen by PCR, Nasal     Status: Abnormal   Collection  Time: 11/12/21  7:36 PM   Specimen: Nasal Mucosa; Nasal Swab  Result Value Ref Range Status   MRSA by PCR Next Gen DETECTED (A) NOT DETECTED Final    Comment: RESULT CALLED TO, READ BACK BY AND VERIFIED WITH: V [email or web address],RN_[email or web address]  11/12/21 [place] (NOTE) The GeneXpert MRSA Assay (FDA approved for NASAL specimens only), is one component of a comprehensive MRSA colonization surveillance program. It is not intended to diagnose MRSA infection nor to guide or monitor treatment for MRSA infections. Test performance is not FDA approved in patients less than 57 years old. Performed at [facility], [place] [address] [place], [place] [address]   Culture, Respiratory w Gram Stain     Status: None (Preliminary result)   Collection Time: 11/13/21  2:09 AM   Specimen: Bronchoalveolar Lavage; Respiratory  Result Value Ref Range Status   Specimen Description BRONCHIAL ALVEOLAR LAVAGE  Final   Special Requests NONE  Final   Gram Stain   Final    MODERATE WBC PRESENT, PREDOMINANTLY PMN FEW GRAM POSITIVE COCCI IN PAIRS AND CHAINS Performed at [facility], [address] [place],  [address]    Culture PENDING  Incomplete   Report Status PENDING  Incomplete    Lipid Panel Recent Labs    11/13/21 0228  TRIG 314*    Studies/Results: CT HEAD WO CONTRAST (5MM)  Result Date: 11/12/2021 CLINICAL DATA:  Altered mental status following cardiac arrest, initial encounter EXAM: CT HEAD WITHOUT CONTRAST TECHNIQUE: Contiguous axial images were obtained from the base of the skull through the vertex without intravenous contrast. RADIATION DOSE REDUCTION: This exam was performed according to the departmental dose-optimization program which includes automated exposure control, adjustment of the mA and/or kV according to patient size and/or use of iterative reconstruction  technique. COMPARISON:  CT from earlier in the same day. FINDINGS: Brain: No findings to suggest acute hemorrhage, acute infarction or  space-occupying mass lesion are noted. 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 No new focal abnormality is seen. Vascular: No hyperdense vessel or unexpected calcification. Skull: Normal. Negative for fracture or focal lesion. Sinuses/Orbits: No acute finding. Other: None. IMPRESSION: No acute abnormality noted. No significant change from the prior exam. Electronically Signed   By: Inez Catalina M.D.   On: 11/12/2021 19:14   CT Angio Chest PE W and/or Wo Contrast  Result Date: 11/12/2021 CLINICAL DATA:  Recent stroke-like symptoms, possible sepsis EXAM: CT ANGIOGRAPHY CHEST CT ABDOMEN AND PELVIS WITH CONTRAST TECHNIQUE: Multidetector CT imaging of the chest was performed using the standard protocol during bolus administration of intravenous contrast. Multiplanar CT image reconstructions and MIPs were obtained to evaluate the vascular anatomy. Multidetector CT imaging of the abdomen and pelvis was performed using the standard protocol during bolus administration of intravenous contrast. RADIATION DOSE REDUCTION: This exam was performed according to the departmental dose-optimization program which includes automated exposure control, adjustment of the mA and/or kV according to patient size and/or use of iterative reconstruction technique. CONTRAST:  137m OMNIPAQUE IOHEXOL 350 MG/ML SOLN COMPARISON:  Chest x-ray from earlier in the same day, CT from 09/08/2021 FINDINGS: CTA CHEST FINDINGS Cardiovascular: Atherosclerotic calcifications are noted. No aneurysmal dilatation or dissection of the thoracic aorta is seen. No cardiac enlargement is noted. Scattered coronary calcifications are noted. The pulmonary artery shows a normal branching pattern. No filling defect to suggest pulmonary embolism is seen. 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 Mediastinum/Nodes: Thoracic inlet is within normal limits. Endotracheal tube is noted in satisfactory position. Gastric catheter extends into the  stomach. The esophagus is within normal limits. No sizable hilar or mediastinal adenopathy is noted Lungs/Pleura: Lungs are well aerated bilaterally. Diffuse consolidation is noted in the medial aspect of the right lower lobe consistent with acute infiltrate. Some associated cavitary changes are noted. No sizable effusion is noted. No other focal infiltrate is noted. Mild left basilar atelectasis is seen. Well-formed bleb is noted in the medial aspect of the left lung base which was not present on the prior exam. Air-fluid level is noted within. Previously noted effusions bilaterally have resolved in the interval. Musculoskeletal: Degenerative changes of the thoracic spine are noted. No acute rib abnormality is seen. Review of the MIP images confirms the above findings. CT ABDOMEN and PELVIS FINDINGS Hepatobiliary: Gallbladder is decompressed. Liver demonstrates a normal enhancement pattern with the exception of a geographic area of decreased attenuation in the lateral segment of the left lobe of the liver. This has a somewhat masslike appearance with hepatic contour change best seen on image number 26 of series 5. This was not present on the prior examination. Given the somewhat masslike density the possibility of a neoplastic disease could not be totally excluded given the patient's clinical history. Pancreas: Unremarkable. No pancreatic ductal dilatation or surrounding inflammatory changes. Spleen: Normal in size without focal abnormality. Adrenals/Urinary Tract: Adrenal glands are within normal limits. The left kidney demonstrates a normal enhancement pattern. No renal calculi or obstructive changes are noted. Right kidney has been surgically removed. Ureter is within normal limits. Bladder is incompletely distended. Stomach/Bowel: Hartmann's pouch is noted in the mid descending colon. Left upper quadrant colostomy is seen assistant with the patient's given clinical history. No obstructive or inflammatory  changes are noted. The appendix is within normal limits.  No small bowel or gastric abnormality is noted. Vascular/Lymphatic: Aortic atherosclerosis. No enlarged abdominal or pelvic lymph nodes. Reproductive: Uterus and bilateral adnexa are unremarkable. Other: No abdominal wall hernia or abnormality. No abdominopelvic ascites. Musculoskeletal: No acute or significant osseous findings. Review of the MIP images confirms the above findings. IMPRESSION: CTA of the chest: No evidence of pulmonary emboli. Large mildly cavitary infiltrate in superior segment of right lower lobe consistent with acute pneumonia. Some minimal infiltrate is noted in the left base as well. Small bleb with air-fluid level within is noted in the medial aspect of the left base. These findings are all new from the prior exam. Resolution of previously seen effusions. 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 CT of the abdomen and pelvis: Postsurgical changes consistent with colostomy. Area of decreased attenuation and enhancement within the lateral segment of the left lobe of the liver with a somewhat mass like appearance along the margin of the liver. Possible metastatic disease would deserve consideration. Status post right nephrectomy. Electronically Signed   By: Inez Catalina M.D.   On: 11/12/2021 19:33   CT ABDOMEN PELVIS W CONTRAST  Result Date: 11/12/2021 CLINICAL DATA:  Recent stroke-like symptoms, possible sepsis EXAM: CT ANGIOGRAPHY CHEST CT ABDOMEN AND PELVIS WITH CONTRAST TECHNIQUE: Multidetector CT imaging of the chest was performed using the standard protocol during bolus administration of intravenous contrast. Multiplanar CT image reconstructions and MIPs were obtained to evaluate the vascular anatomy. Multidetector CT imaging of the abdomen and pelvis was performed using the standard protocol during bolus administration of intravenous contrast. RADIATION DOSE REDUCTION: This exam was performed according to the  departmental dose-optimization program which includes automated exposure control, adjustment of the mA and/or kV according to patient size and/or use of iterative reconstruction technique. CONTRAST:  143m OMNIPAQUE IOHEXOL 350 MG/ML SOLN COMPARISON:  Chest x-ray from earlier in the same day, CT from 09/08/2021 FINDINGS: CTA CHEST FINDINGS Cardiovascular: Atherosclerotic calcifications are noted. No aneurysmal dilatation or dissection of the thoracic aorta is seen. No cardiac enlargement is noted. Scattered coronary calcifications are noted. The pulmonary artery shows a normal branching pattern. No filling defect to suggest pulmonary embolism is seen. 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 Mediastinum/Nodes: Thoracic inlet is within normal limits. Endotracheal tube is noted in satisfactory position. Gastric catheter extends into the stomach. The esophagus is within normal limits. No sizable hilar or mediastinal adenopathy is noted Lungs/Pleura: Lungs are well aerated bilaterally. Diffuse consolidation is noted in the medial aspect of the right lower lobe consistent with acute infiltrate. Some associated cavitary changes are noted. No sizable effusion is noted. No other focal infiltrate is noted. Mild left basilar atelectasis is seen. Well-formed bleb is noted in the medial aspect of the left lung base which was not present on the prior exam. Air-fluid level is noted within. Previously noted effusions bilaterally have resolved in the interval. Musculoskeletal: Degenerative changes of the thoracic spine are noted. No acute rib abnormality is seen. Review of the MIP images confirms the above findings. CT ABDOMEN and PELVIS FINDINGS Hepatobiliary: Gallbladder is decompressed. Liver demonstrates a normal enhancement pattern with the exception of a geographic area of decreased attenuation in the lateral segment of the left lobe of the liver. This has a somewhat masslike appearance with  hepatic contour change best seen on image number 26 of series 5. This was not present on the prior examination. Given the somewhat masslike density the possibility of a neoplastic disease could not  be totally excluded given the patient's clinical history. Pancreas: Unremarkable. No pancreatic ductal dilatation or surrounding inflammatory changes. Spleen: Normal in size without focal abnormality. Adrenals/Urinary Tract: Adrenal glands are within normal limits. The left kidney demonstrates a normal enhancement pattern. No renal calculi or obstructive changes are noted. Right kidney has been surgically removed. Ureter is within normal limits. Bladder is incompletely distended. Stomach/Bowel: Hartmann's pouch is noted in the mid descending colon. Left upper quadrant colostomy is seen assistant with the patient's given clinical history. No obstructive or inflammatory changes are noted. The appendix is within normal limits. No small bowel or gastric abnormality is noted. Vascular/Lymphatic: Aortic atherosclerosis. No enlarged abdominal or pelvic lymph nodes. Reproductive: Uterus and bilateral adnexa are unremarkable. Other: No abdominal wall hernia or abnormality. No abdominopelvic ascites. Musculoskeletal: No acute or significant osseous findings. Review of the MIP images confirms the above findings. IMPRESSION: CTA of the chest: No evidence of pulmonary emboli. Large mildly cavitary infiltrate in superior segment of right lower lobe consistent with acute pneumonia. Some minimal infiltrate is noted in the left base as well. Small bleb with air-fluid level within is noted in the medial aspect of the left base. These findings are all new from the prior exam. Resolution of previously seen effusions. 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 CT of the abdomen and pelvis: Postsurgical changes consistent with colostomy. Area of decreased attenuation and enhancement within the lateral segment of the left lobe of the  liver with a somewhat mass like appearance along the margin of the liver. Possible metastatic disease would deserve consideration. Status post right nephrectomy. Electronically Signed   By: Inez Catalina M.D.   On: 11/12/2021 19:33   Portable Chest xray  Result Date: 11/13/2021 CLINICAL DATA:  A 56 year old female presents for evaluation of respiratory failure. EXAM: PORTABLE CHEST 1 VIEW COMPARISON:  Comparison is made with November 12, 2021. FINDINGS: Pacer defibrillator pad projects over the central chest. EKG leads project over the chest and upper abdomen. LEFT-sided central venous catheter terminates at the caval to atrial junction. Endotracheal tube approximately 3.9 cm from the carina. Cardiomediastinal contours and hilar structures are stable. Added density about the RIGHT hilum compatible with known RIGHT lower lobe perihilar masslike area seen on recent imaging without gross change. No additional areas of consolidation or masslike appearance. No pneumothorax. On limited assessment there is no acute skeletal process. IMPRESSION: 1. Endotracheal tube approximately 3.9 cm from the carina. 2. No substantial change since recent imaging. 3. RIGHT perihilar masslike area may represent necrotic pneumonia, area without abnormality in September of 2022. Possibility of neoplasm is considered in this patient with history of cervical cancer. Short interval follow-up after therapy is suggested. Pulmonary consultation may be helpful if not yet obtained. Electronically Signed   By: Zetta Bills M.D.   On: 11/13/2021 08:04   DG CHEST PORT 1 VIEW  Result Date: 11/12/2021 CLINICAL DATA:  Central line placement. EXAM: PORTABLE CHEST 1 VIEW COMPARISON:  Radiograph earlier today. FINDINGS: New left subclavian central venous catheter tip overlies the lower SVC. No pneumothorax. Endotracheal tube and enteric tube remain in place. Stable heart size. Right perihilar masslike opacity characterized on CT earlier today. There  is mild background bronchial thickening. No significant pleural effusion. IMPRESSION: 1. New left subclavian central venous catheter with tip overlying the lower SVC. No pneumothorax. 2. Right perihilar masslike opacity, characterized on CT earlier today. Electronically Signed   By: Keith Rake M.D.   On: 11/12/2021 21:02  DG Chest Portable 1 View  Result Date: 11/12/2021 CLINICAL DATA:  ng/ett placement EXAM: PORTABLE CHEST 1 VIEW COMPARISON:  Same day chest radiograph. FINDINGS: Masslike prominence of the right hilum persists. Cardiac silhouette is mildly enlarged. No consolidation. No visible pleural effusions or pneumothorax on this semi upright AP radiograph. IMPRESSION: 1. Masslike prominence of the right hilum persists. Recommend CT chest (preferably with contrast) when able to exclude adenopathy and/or mass. 2. Endotracheal tube tip approximately 2.3 cm above the carina. 3. Gastric tube courses below the diaphragm with the tip outside the field of view. Electronically Signed   By: Margaretha Sheffield M.D.   On: 11/12/2021 18:27   DG Chest Port 1 View  Result Date: 11/12/2021 CLINICAL DATA:  Questionable sepsis.  Right-sided weakness. EXAM: PORTABLE CHEST 1 VIEW COMPARISON:  04/17/2020.  Chest CT 06/16/2021 FINDINGS: Mild cardiomegaly. Aortic atherosclerosis. No evidence of consolidation, collapse or effusion. Mild hilar prominence right more than left felt most likely secondary to the portable AP technique and poor inspiration. Additional chest radiographic follow-up suggested when the patient is able to have a standard two view exam to exclude right hilar prominence. IMPRESSION: Cardiomegaly.  Aortic atherosclerosis. Technical limitations of poor inspiration and portable AP technique. Recommend follow-up two-view chest radiography when able to exclude right hilar mass. Electronically Signed   By: Nelson Chimes M.D.   On: 11/12/2021 16:08   CT HEAD CODE STROKE WO CONTRAST  Result Date:  11/12/2021 CLINICAL DATA:  Code stroke. Neuro deficit, acute, stroke suspected. EXAM: CT HEAD WITHOUT CONTRAST TECHNIQUE: Contiguous axial images were obtained from the base of the skull through the vertex without intravenous contrast. RADIATION DOSE REDUCTION: This exam was performed according to the departmental dose-optimization program which includes automated exposure control, adjustment of the mA and/or kV according to patient size and/or use of iterative reconstruction technique. COMPARISON:  04/17/2020 FINDINGS: Brain: The study suffers from significant motion degradation. No focal abnormality is seen affecting the brainstem or cerebellum. Cerebral hemispheres show an old lacunar infarction in the left basal ganglia an old left frontal cortical and subcortical infarction. No sign of acute infarction, mass lesion, hemorrhage, hydrocephalus or extra-axial collection. Vascular: There is atherosclerotic calcification of the major vessels at the base of the brain. Skull: Negative, allowing for the motion degradation. Sinuses/Orbits: Clear/normal Other: None ASPECTS ([place] Stroke Program Early CT Score) - Ganglionic level infarction (caudate, lentiform nuclei, internal capsule, insula, M1-M3 cortex): 7 - Supraganglionic infarction (M4-M6 cortex): 3 Total score (0-10 with 10 being normal): 10 IMPRESSION: 1. Motion degraded study. No acute CT finding. Old left basal ganglia lacunar infarction. Old left frontal cortical and subcortical infarction. 2. ASPECTS is 10, allowing for motion. 3. These results were called by telephone at the time of interpretation on 11/12/2021 at 3:22 pm to provider MATTHEW TRIFAN , who verbally acknowledged these results. Electronically Signed   By: Nelson Chimes M.D.   On: 11/12/2021 15:27    Medications: Scheduled:  chlorhexidine gluconate (MEDLINE KIT)  15 mL Mouth Rinse BID   Chlorhexidine Gluconate Cloth  6 each Topical Daily   fentaNYL (SUBLIMAZE) injection  50 mcg Intravenous  Once   insulin aspart  0-15 Units Subcutaneous Q4H   mouth rinse  15 mL Mouth Rinse 10 times per day   [START ON 11/16/2021] pantoprazole  40 mg Intravenous Q12H   sodium chloride flush  3 mL Intravenous Once   Continuous:  sodium chloride     acyclovir Stopped (11/13/21 0519)   cefTRIAXone (ROCEPHIN)  IV Stopped (11/13/21 0541)   fentaNYL infusion INTRAVENOUS 150 mcg/hr (11/13/21 4680)   levETIRAcetam     midazolam 14 mg/hr (11/13/21 0815)   norepinephrine (LEVOPHED) Adult infusion 12 mcg/min (11/13/21 0600)   propofol (DIPRIVAN) infusion 30 mcg/kg/min (11/13/21 0600)   vancomycin 750 mg (11/13/21 0802)   Upper endoscopy report conclusions:  - Blood in clots in the gastric fundus and in the gastric body. Several - easily removed - no bleeding lesions/source found - Erosive gastropathy with no stigmata of recent bleeding. OG TUBE TRAUMA IN BODY - Mottled muocsal features mucosa in the antrum.   Assessment: 56 year old woman with a history of cervical cancer, status post nephrectomy 2 months ago, colostomy, CAD, diabetes, depression, hypertension, remote history of stroke without residual deficits who was brought in by EMS for altered mental status.  CT head NAICP and her exam was completely nonfocal so suspicion for stroke is low.  - DDx: Etiology of encephalopathy favored to be sepsis. CT head was reassuring. Has had a flulike illness for a week and was found confused on 2/19. Some suspicion by son that she may have been taking more prescription narcotics; however, UDS was only positive for benzo. Also taking many NSAIDs per his report. - In addition to the above, had acute ardiopulmonary arrest, PEA arrest yesterday with CPR time of 2 minutes. Diagnosed with acute respiratory failure with hypoxemia and was intubated yesterday.  - Exam today under heavy sedation with propofol, Versed and Fentanyl drips (30/15/150) is unrevealing given effects of sedation. No clinical seizure activity noted.   - Acute GI bleed, hemoglobin 8.9 > 9.9.  History of NSAID abuse. See bronchoscopy results above.  - CCM favors empiric treatment with ceftriaxone, vancomycin, ampicillin, acyclovir for possible meningitis. Neurology team is in agreement. - CT head was motion degraded, with no acute CT finding. Noted were an old left basal ganglia lacunar infarction and an old left frontal cortical and subcortical infarction. Seen on CTA chest is a chronic occlusion of the left common carotid artery.   Recommendations: - Continue empiric meningitis-dose ABX. Meningitis is lower on the DDx than other etiologies for her fever and AMS. May need LP to completely rule out a meningitis. Will defer to ICU team regarding timing of LP. - No further stroke workup indicated at this time - Follow up Neurology exam when weaned off sedation.  - EEG has been ordered.   45 minutes spent in the neurological evaluation and management of this critically ill patient    LOS: 1 day   _0  signed: Dr. Kerney Elbe 11/13/2021  8:21 AM

## 2021-11-14 ENCOUNTER — Inpatient Hospital Stay (HOSPITAL_COMMUNITY): Payer: Medicaid Other

## 2021-11-14 DIAGNOSIS — I469 Cardiac arrest, cause unspecified: Secondary | ICD-10-CM | POA: Diagnosis not present

## 2021-11-14 DIAGNOSIS — R4182 Altered mental status, unspecified: Secondary | ICD-10-CM | POA: Diagnosis not present

## 2021-11-14 LAB — PHOSPHORUS
Phosphorus: 3.7 mg/dL (ref 2.5–4.6)
Phosphorus: 2.8 mg/dL (ref 2.5–4.6)

## 2021-11-14 LAB — POCT I-STAT 7, (LYTES, BLD GAS, ICA,H+H)
Acid-base deficit: 1 mmol/L (ref 0.0–2.0)
Acid-base deficit: 2 mmol/L (ref 0.0–2.0)
Bicarbonate: 26.7 mmol/L (ref 20.0–28.0)
Bicarbonate: 26 mmol/L (ref 20.0–28.0)
Calcium, Ion: 1.16 mmol/L (ref 1.15–1.40)
Calcium, Ion: 1.2 mmol/L (ref 1.15–1.40)
HCT: 33 % — ABNORMAL LOW (ref 36.0–46.0)
HCT: 31 % — ABNORMAL LOW (ref 36.0–46.0)
Hemoglobin: 11.2 g/dL — ABNORMAL LOW (ref 12.0–15.0)
Hemoglobin: 10.5 g/dL — ABNORMAL LOW (ref 12.0–15.0)
O2 Saturation: 89 %
O2 Saturation: 95 %
Patient temperature: 99.2
Patient temperature: 97.6
Potassium: 3.9 mmol/L (ref 3.5–5.1)
Potassium: 3.7 mmol/L (ref 3.5–5.1)
Sodium: 131 mmol/L — ABNORMAL LOW (ref 135–145)
Sodium: 132 mmol/L — ABNORMAL LOW (ref 135–145)
TCO2: 29 mmol/L (ref 22–32)
TCO2: 28 mmol/L (ref 22–32)
pCO2 arterial: 62 mmHg — ABNORMAL HIGH (ref 32–48)
pCO2 arterial: 58.2 mmHg — ABNORMAL HIGH (ref 32–48)
pH, Arterial: 7.244 — ABNORMAL LOW (ref 7.35–7.45)
pH, Arterial: 7.255 — ABNORMAL LOW (ref 7.35–7.45)
pO2, Arterial: 68 mmHg — ABNORMAL LOW (ref 83–108)
pO2, Arterial: 86 mmHg (ref 83–108)

## 2021-11-14 LAB — BASIC METABOLIC PANEL
Anion gap: 10 (ref 5–15)
Anion gap: 11 (ref 5–15)
BUN: 12 mg/dL (ref 6–20)
BUN: 13 mg/dL (ref 6–20)
CO2: 25 mmol/L (ref 22–32)
CO2: 22 mmol/L (ref 22–32)
Calcium: 7.6 mg/dL — ABNORMAL LOW (ref 8.9–10.3)
Calcium: 7.7 mg/dL — ABNORMAL LOW (ref 8.9–10.3)
Chloride: 96 mmol/L — ABNORMAL LOW (ref 98–111)
Chloride: 99 mmol/L (ref 98–111)
Creatinine, Ser: 1.06 mg/dL — ABNORMAL HIGH (ref 0.44–1.00)
Creatinine, Ser: 0.98 mg/dL (ref 0.44–1.00)
GFR, Estimated: >60 mL/min (ref >60)
GFR, Estimated: >60 mL/min (ref >60)
Glucose, Bld: 213 mg/dL — ABNORMAL HIGH (ref 70–99)
Glucose, Bld: 120 mg/dL — ABNORMAL HIGH (ref 70–99)
Potassium: 3.3 mmol/L — ABNORMAL LOW (ref 3.5–5.1)
Potassium: 3.9 mmol/L (ref 3.5–5.1)
Sodium: 131 mmol/L — ABNORMAL LOW (ref 135–145)
Sodium: 132 mmol/L — ABNORMAL LOW (ref 135–145)

## 2021-11-14 LAB — CBC
HCT: 32.8 % — ABNORMAL LOW (ref 36.0–46.0)
Hemoglobin: 10.1 g/dL — ABNORMAL LOW (ref 12.0–15.0)
MCH: 27.8 pg (ref 26.0–34.0)
MCHC: 30.8 g/dL (ref 30.0–36.0)
MCV: 90.4 fL (ref 80.0–100.0)
Platelets: 181 10*3/uL (ref 150–400)
RBC: 3.63 MIL/uL — ABNORMAL LOW (ref 3.87–5.11)
RDW: 16 % — ABNORMAL HIGH (ref 11.5–15.5)
WBC: 26.7 10*3/uL — ABNORMAL HIGH (ref 4.0–10.5)
nRBC: 0.1 % (ref 0.0–0.2)

## 2021-11-14 LAB — MAGNESIUM
Magnesium: 1.7 mg/dL (ref 1.7–2.4)
Magnesium: 1.6 mg/dL — ABNORMAL LOW (ref 1.7–2.4)

## 2021-11-14 LAB — GLUCOSE, CAPILLARY
Glucose-Capillary: 108 mg/dL — ABNORMAL HIGH (ref 70–99)
Glucose-Capillary: 116 mg/dL — ABNORMAL HIGH (ref 70–99)
Glucose-Capillary: 118 mg/dL — ABNORMAL HIGH (ref 70–99)
Glucose-Capillary: 134 mg/dL — ABNORMAL HIGH (ref 70–99)
Glucose-Capillary: 178 mg/dL — ABNORMAL HIGH (ref 70–99)
Glucose-Capillary: 218 mg/dL — ABNORMAL HIGH (ref 70–99)

## 2021-11-14 LAB — LACTIC ACID, PLASMA: Lactic Acid, Venous: 1.2 mmol/L (ref 0.5–1.9)

## 2021-11-14 MED ORDER — POTASSIUM CHLORIDE 10 MEQ/50ML IV SOLN
10.0000 meq | INTRAVENOUS | Status: AC
  Administered 2021-11-14 (×6): 10 meq via INTRAVENOUS
  Filled 2021-11-14 (×5): qty 50

## 2021-11-14 MED ORDER — SODIUM CHLORIDE 0.9 % IV SOLN: INTRAVENOUS | Status: DC | PRN

## 2021-11-14 MED ORDER — METRONIDAZOLE 500 MG/100ML IV SOLN
500.0000 mg | Freq: Three times a day (TID) | INTRAVENOUS | Status: DC
  Administered 2021-11-14 – 2021-11-20 (×17): 500 mg via INTRAVENOUS
  Filled 2021-11-14 (×17): qty 100

## 2021-11-14 MED ORDER — SODIUM CHLORIDE 0.9 % IV SOLN
2.0000 g | Freq: Three times a day (TID) | INTRAVENOUS | Status: DC
  Administered 2021-11-14 – 2021-11-20 (×17): 2 g via INTRAVENOUS
  Filled 2021-11-14 (×17): qty 2

## 2021-11-14 MED ORDER — EPINEPHRINE 1 MG/10ML IJ SOSY
PREFILLED_SYRINGE | INTRAMUSCULAR | Status: AC
  Filled 2021-11-14: qty 10

## 2021-11-14 MED ORDER — INSULIN ASPART 100 UNIT/ML IJ SOLN
0.0000 [IU] | INTRAMUSCULAR | Status: DC
  Administered 2021-11-14: 1 [IU] via SUBCUTANEOUS
  Administered 2021-11-14: 2 [IU] via SUBCUTANEOUS
  Administered 2021-11-15 – 2021-11-16 (×5): 1 [IU] via SUBCUTANEOUS
  Administered 2021-11-16: 2 [IU] via SUBCUTANEOUS
  Administered 2021-11-17: 1 [IU] via SUBCUTANEOUS
  Administered 2021-11-17: 2 [IU] via SUBCUTANEOUS
  Administered 2021-11-17: 3 [IU] via SUBCUTANEOUS
  Administered 2021-11-17: 2 [IU] via SUBCUTANEOUS
  Administered 2021-11-18: 5 [IU] via SUBCUTANEOUS
  Administered 2021-11-18: 3 [IU] via SUBCUTANEOUS
  Administered 2021-11-18: 5 [IU] via SUBCUTANEOUS
  Administered 2021-11-18 (×2): 2 [IU] via SUBCUTANEOUS
  Administered 2021-11-18: 1 [IU] via SUBCUTANEOUS
  Administered 2021-11-19: 9 [IU] via SUBCUTANEOUS
  Administered 2021-11-19: 7 [IU] via SUBCUTANEOUS
  Administered 2021-11-19: 3 [IU] via SUBCUTANEOUS
  Administered 2021-11-19: 9 [IU] via SUBCUTANEOUS

## 2021-11-14 MED ORDER — GADOBUTROL 1 MMOL/ML IV SOLN
8.5000 mL | Freq: Once | INTRAVENOUS | Status: AC | PRN
  Administered 2021-11-14: 8.5 mL via INTRAVENOUS

## 2021-11-14 MED ORDER — INSULIN ASPART 100 UNIT/ML IJ SOLN
3.0000 [IU] | INTRAMUSCULAR | Status: DC
  Administered 2021-11-14 – 2021-11-19 (×19): 3 [IU] via SUBCUTANEOUS

## 2021-11-14 MED ORDER — METRONIDAZOLE 500 MG/100ML IV SOLN: 500.0000 mg | Freq: Two times a day (BID) | INTRAVENOUS | Status: DC

## 2021-11-14 MED ORDER — VITAL AF 1.2 CAL PO LIQD
1000.0000 mL | ORAL | Status: DC
  Administered 2021-11-17 – 2021-12-04 (×13): 1000 mL

## 2021-11-14 MED ORDER — ATROPINE SULFATE 1 MG/10ML IJ SOSY
PREFILLED_SYRINGE | INTRAMUSCULAR | Status: AC
  Filled 2021-11-14: qty 10

## 2021-11-14 MED ORDER — PROSOURCE TF PO LIQD
45.0000 mL | Freq: Two times a day (BID) | ORAL | Status: DC
  Filled 2021-11-14: qty 45

## 2021-11-14 MED ORDER — VITAL HIGH PROTEIN PO LIQD: 1000.0000 mL | ORAL | Status: DC

## 2021-11-14 NOTE — Progress Notes (Signed)
NAME:  Kristen Murphy, MRN:  [id number], DOB:  1966-03-02, LOS: 2 ADMISSION DATE:  11/12/2021, CONSULTATION DATE: 11/12/2021 REFERRING MD: Dr. Langston Masker, CHIEF COMPLAINT: PEA arrest  History of Present Illness:   56 year old woman with a history of tobacco use, cervical cancer ([facility]), post nephrectomy and colostomy, CAD with LAD stent 01/2019, diabetes, depression, hypertension, remote CVA without known deficits.  Also with a history of substance abuse.  Brought in by her son to the ED 2/19 with fever, altered mental status and slurred speech. Apparently has had a flulike illness for a week, was found confused on 2/19.  Some suspicion by son that she may have been taking more prescription narcotics, also taking many NSAIDs per his report.  She was disoriented in the ED, febrile 103F, hyperglycemic. In the ED she apparently experienced acute hypoxemia, had some blood from her mouth, question emesis versus hemoptysis.  Devolved to PEA and required emergent ET intubation and CPR.  ROSC after 2 minutes.  OG tube placed with bright red blood obtained.  Chest x-ray with mild right hilar prominence, more notable on postintubation film.  No infiltrates or effusions She has received empiric antibiotics, is receiving 1 unit PRBC, is about to start Protonix infusion. Hemodynamically stabilized, mechanically ventilated.   Pertinent  Medical History   Past Medical History:  Diagnosis Date   Anemia    Anxiety    Arthritis    Asthma    Cervical cancer ([place])    Chronic kidney disease    Coronary artery disease    Depression    Diabetes mellitus without complication (HCC)    Dyspnea    Headache    Heart murmur    Hx of adenomatous polyp of colon    Hypertension    Rectovaginal fistula    Seizures ([place])    Stercoral ulcer of rectum 12/2020   Stroke [facility])     Significant Hospital Events: Including procedures, antibiotic start and stop dates in addition to other pertinent events   Head CT 2/19 > no  acute CT findings, old left basal ganglia lacunar, old left frontal cortical and subcortical infarct   Interim History / Subjective:   Weaned off versed Remains on high vent settings and tachypneic  Objective   Blood pressure (!) 155/57, pulse (!) 115, temperature 99.1 F (37.3 C), resp. rate (!) 32, height 5\' 2"  (1.575 m), weight 82.6 kg, SpO2 97 %.    Vent Mode: (P) PRVC FiO2 (%):  [80 %-100 %] 80 % Set Rate:  [24 bmp-28 bmp] (P) 28 bmp Vt Set:  [400 mL] (P) 400 mL PEEP:  [10 cmH20] (P) 10 cmH20 Plateau Pressure:  [20 cmH20-33 cmH20] 20 cmH20   Intake/Output Summary (Last 24 hours) at 11/14/2021 0747 Last data filed at 11/14/2021 0700 Gross per 24 hour  Intake 2780.91 ml  Output 1105 ml  Net 1675.91 ml   Filed Weights   11/12/21 1536 11/14/21 0500  Weight: 75.6 kg 82.6 kg   Physical Exam: General: Critically ill-appearing, sedated HENT: [place], AT, OP clear, MMM Eyes: EOMI, no scleral icterus Respiratory: Improved clear to auscultation bilaterally.  No crackles, wheezing or rales Cardiovascular: RRR, -M/R/G, no JVD GI: BS+, soft, nontender Extremities:-Edema,-tenderness Neuro: Sedated, RASS goal -4 and -5. Minimal reactive PERRL. No withdrawal  CTA 11/12/21 - RLL cavitary infiltrate CT A/P 11/12/21 - S/p colostomy, s/p right nephrectomy  Resolved Hospital Problem list     Assessment & Plan:   Acute metabolic encephalopathy, cause unclear ?Seizures vs  infectious etiology vs oversedation Repeat CT head 2/19 neg for acute findings.  EEG neg for seizures. Low suspicion for meningitis in setting of alternative infection however covered with antibiotics.  Consider also substances as son concerned about possible narcotic use.  UDS only positive for benzo -PAD protocol for sedation: Weaned off versed. Wean propofol and fentanyl for RASS goal -1 -Discussed plan with Neuro. -Consider MRI brain and/or LP if mental status unchanged  Acute respiratory failure with hypoxemia  secondary to aspiration/necrotizing pneumonia, meningitis Severe sepsis secondary aspiration pneumonia.   -Full vent support. PRVC 8 cc/kg -VAP prevention orders -PRN ABG and CXR -Continue empiric antibiotic coverage -F/u cultures -Trend LA  Acute cardiopulmonary arrest, PEA arrest -Defer TTM, CPR time 2 minutes, initiated immediately -Follow for any evidence of endorgan injury -Supportive care -Telemetry  Acute blood loss anemia - Hg stable History of NSAID abuse. EGD blood clots in gastric fundus/body, erosive gastropathy, no stigmata of bleeding but OG tube trauma in body -GI following -PPI BID -No history of alcohol, octreotide deferred  Hypertension History of CAD -Holding amlodipine, metoprolol, lisinopril until stabilizing -Hold aspirin in setting of GI blood loss  Diabetes with hyperglycemia -CBGs, SSI if > 180 -Home metformin on hold -Home Farxiga on hold  Hyponatremia, hypokalemia Hypocalcemia - corrected to 9 with low albumin -Replete K -Follow BMP this afternoon  Nutrition -OK to resume TF  Best Practice (right click and "Reselect all SmartList Selections" daily)   Diet/type: tubefeeds DVT prophylaxis: SCD GI prophylaxis: PPI Lines: Central line Foley:  N/A Code Status:  full code Last date of multidisciplinary goals of care discussion [Updated son on 2/20]  Labs   CBC: Recent Labs  Lab 11/12/21 1508 11/12/21 1514 11/13/21 0228 11/13/21 0353 11/13/21 1540 11/13/21 1821 11/13/21 2031 11/13/21 2110 11/14/21 0315  WBC 18.6*  --  30.2*  --   --   --   --   --  26.7*  NEUTROABS 16.7*  --   --   --   --   --   --   --   --   HGB 8.9*   < > 10.7*   < > 12.2 10.9* 11.6* 11.2* 10.1*  HCT 28.9*   < > 35.1*   < > 36.0 32.0* 34.0* 33.0* 32.8*  MCV 87.0  --  90.0  --   --   --   --   --  90.4  PLT 251  --  214  --   --   --   --   --  181   < > = values in this interval not displayed.    Basic abnormalities panel: Recent Labs  Lab 11/12/21 1508  11/12/21 1514 11/12/21 1759 11/12/21 1931 11/12/21 1946 11/13/21 0228 11/13/21 0353 11/13/21 1540 11/13/21 1821 11/13/21 2031 11/13/21 2110 11/14/21 0315  NA 126* 129*   < >  --    < > 135   < > 132* 133* 130* 131* 131*  K 3.0* 3.1*   < >  --    < > 3.8   < > 3.5 3.1* 3.6 3.4* 3.3*  CL 93* 97*  --   --   --  99  --   --   --   --   --  96*  CO2 20*  --   --   --   --  26  --   --   --   --   --  25  GLUCOSE 417*  412*  --   --   --  125*  --   --   --   --   --  213*  BUN 16 15  --   --   --  14  --   --   --   --   --  12  CREATININE 1.26* 1.10*  --   --   --  1.10*  --   --   --   --   --  1.06*  CALCIUM 7.9*  --   --   --   --  7.8*  --   --   --   --   --  7.6*  MG  --   --   --  2.0  --   --   --   --   --   --   --   --   PHOS  --   --   --  5.7*  --   --   --   --   --   --   --   --    < > = values in this interval not displayed.   GFR: Estimated Creatinine Clearance: 59 mL/min (A) (by C-G formula based on SCr of 1.06 mg/dL (H)). Recent Labs  Lab 11/12/21 1508 11/12/21 1518 11/12/21 1931 11/12/21 2233 11/13/21 0228 11/14/21 0315  PROCALCITON  --   --  2.04  --   --   --   WBC 18.6*  --   --   --  30.2* 26.7*  LATICACIDVEN  --  1.5 2.5* 1.9  --   --     Liver Function Tests: Recent Labs  Lab 11/12/21 1508 11/13/21 0228  AST 12* 84*  ALT 12 51*  ALKPHOS 58 65  BILITOT 0.3 0.2*  PROT 7.0 6.4*  ALBUMIN 2.5* 2.3*   No results for input(s): LIPASE, AMYLASE in the last 168 hours. No results for input(s): AMMONIA in the last 168 hours.  ABG    Component Value Date/Time   PHART 7.265 (L) 11/13/2021 2110   PCO2ART 52.8 (H) 11/13/2021 2110   PO2ART 76 (L) 11/13/2021 2110   HCO3 23.9 11/13/2021 2110   TCO2 26 11/13/2021 2110   ACIDBASEDEF 3.0 (H) 11/13/2021 2110   O2SAT 92 11/13/2021 2110     Coagulation Profile: Recent Labs  Lab 11/12/21 1508  INR 1.2    Cardiac Enzymes: No results for input(s): CKTOTAL, CKMB, CKMBINDEX, TROPONINI in the last 168  hours.  HbA1C: Hgb A1c MFr Bld  Date/Time Value Ref Range Status  11/12/2021 07:31 PM 7.2 (H) 4.8 - 5.6 % Final    Comment:    (NOTE) Pre diabetes:          5.7%-6.4%  Diabetes:              >6.4%  Glycemic control for   <7.0% adults with diabetes   02/19/2019 03:32 AM 9.2 (H) 4.8 - 5.6 % Final    Comment:    (NOTE) Pre diabetes:          5.7%-6.4% Diabetes:              >6.4% Glycemic control for   <7.0% adults with diabetes     CBG: Recent Labs  Lab 11/13/21 1103 11/13/21 1555 11/13/21 1957 11/13/21 2348 11/14/21 0306  GLUCAP 82 119* 192* 224* 218*   Critical care time: 70 minutes   The patient is critically ill with multiple organ  systems failure and requires high complexity decision making for assessment and support, frequent evaluation and titration of therapies, application of advanced monitoring technologies and extensive interpretation of multiple databases.  Independent Critical Care Time: 81 Minutes.   Rodman Pickle, M.D. [facility] Pulmonary/Critical Care Medicine 11/14/2021 7:47 AM   Please see Amion for pager number to reach on-call Pulmonary and Critical Care Team.

## 2021-11-14 NOTE — Progress Notes (Signed)
RT NOTE: patient does not meet SBT criteria this AM due to PEEP and FIO2 requirements.

## 2021-11-14 NOTE — Progress Notes (Signed)
[facility] ADULT ICU REPLACEMENT PROTOCOL   The patient does apply for the [facility] Adult ICU Electrolyte Replacment Protocol based on the criteria listed below:   1.Exclusion criteria: TCTS patients, ECMO patients, and Dialysis patients 2. Is GFR >/= 30 ml/min? Yes.    Patient's GFR today is >60 3. Is SCr </= 2? Yes.   Patient's SCr is 1.06 mg/dL 4. Did SCr increase >/= 0.5 in 24 hours? No. 5.Pt's weight >40kg  Yes.   6. Abnormal electrolyte(s): K 3.3  7. Electrolytes replaced per protocol 8.  Call MD STAT for K+ </= 2.5, Phos </= 1, or Mag </= 1 Physician:    Ronda Fairly A 11/14/2021 5:57 AM

## 2021-11-14 NOTE — Progress Notes (Signed)
PCCM Progress Note  Contacted by Radiology for STAT read. 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 Discussed with Neurology regarding risks and benefits of LP in this setting.   --Consult ID. Continue Vanc. Change from Ceftriaxone to Cefepime. Start Flagyl. Stopped acyclovir --Consult Neurosurgery for evaluation --Updated family at bedside and addressed questions and concers  Care Time: 79 min  Rodman Pickle, M.D. [facility] Pulmonary/Critical Care Medicine 11/14/2021 5:31 PM   See Amion for personal pager For hours between 7 PM to 7 AM, please call Elink for urgent questions

## 2021-11-14 NOTE — Progress Notes (Addendum)
Subjective: No family at bedside. Patient still obtunded and nonresponsive.  Objective: Current vital signs: BP (!) 136/48    Pulse (!) 108    Temp 99.1 F (37.3 C)    Resp (!) 28    Ht _0  (1.575 m)    Wt 82.6 kg Comment: Unable to obtain accurate weight due to TTM- bed was not zeroed with pads included when initiated   SpO2 98%    BMI 33.31 kg/m  Vital signs in last 24 hours: Temp:  [97.5 F (36.4 C)-99.5 F (37.5 C)] 99.1 F (37.3 C) (02/21 0653) Pulse Rate:  [86-114] 108 (02/21 0346) Resp:  [14-33] 28 (02/21 0346) BP: (81-143)/(31-74) 136/48 (02/21 0346) SpO2:  [94 %-100 %] 98 % (02/21 0346) Arterial Line BP: (125-163)/(43-53) 131/47 (02/21 0330) FiO2 (%):  [90 %-100 %] 90 % (02/21 0346) Weight:  [82.6 kg] 82.6 kg (02/21 0500)  Intake/Output from previous day: 02/20 0701 - 02/21 0700 In: 2554.1 [I.V.:1565; IV Piggyback:989.1] Out: 2482 [Urine:1105] Intake/Output this shift: No intake/output data recorded. Nutritional status:  Diet Order             Diet NPO time specified  Diet effective now                  HEENT: No nuchal rigidity.  Lungs: Intubated  Neurologic Exam: Performed under some sedation Fentanyl 100 Ment: No responses to any external stimuli. Eyes remain closed during exam. Not alerting to voice.  CN: In the context of sedation, pupils are pinpoint. Persistent upward gaze. No blink to threat. No doll's eye reflex.  No blink to eyelid stimulation. Does initiate breaths on the vent. Face flaccidly symmetric.  Motor/Sensory: Flaccid tone BUE with no movement to noxious. Internally rotates shoulders synchronously with the vent. BLE with increased extensor tone. No movement to noxious. Negative Kernig and Brudzinski sign. Reflexes: Brisk low amplitude patellar reflexes. No clonus with passive dorsiflexion at ankles.  Cerebellar/Gait: Unable to assess  Lab Results: Results for orders placed or performed during the hospital encounter of 11/12/21 (from the  past 48 hour(s))  Protime-INR     Status: None   Collection Time: 11/12/21  3:08 PM  Result Value Ref Range   Prothrombin Time 15.0 11.4 - 15.2 seconds   INR 1.2 0.8 - 1.2    Comment: (NOTE) INR goal varies based on device and disease states. Performed at [facility], [place] [address], [place], [place] [address]   APTT     Status: None   Collection Time: 11/12/21  3:08 PM  Result Value Ref Range   aPTT 27 24 - 36 seconds    Comment: Performed at [facility] [address] [place], [place] [address]  CBC     Status: Abnormal   Collection Time: 11/12/21  3:08 PM  Result Value Ref Range   WBC 18.6 (H) 4.0 - 10.5 K/uL   RBC 3.32 (L) 3.87 - 5.11 MIL/uL   Hemoglobin 8.9 (L) 12.0 - 15.0 g/dL   HCT 28.9 (L) 36.0 - 46.0 %   MCV 87.0 80.0 - 100.0 fL   MCH 26.8 26.0 - 34.0 pg   MCHC 30.8 30.0 - 36.0 g/dL   RDW 16.0 (H) 11.5 - 15.5 %   Platelets 251 150 - 400 K/uL   nRBC 0.2 0.0 - 0.2 %    Comment: Performed at [facility], [place] [address] [place], [place] [address]  Differential     Status:  Abnormal   Collection Time: 11/12/21  3:08 PM  Result Value Ref Range   Neutrophils Relative % 89 %   Neutro Abs 16.7 (H) 1.7 - 7.7 K/uL   Lymphocytes Relative 5 %   Lymphs Abs 0.8 0.7 - 4.0 K/uL   Monocytes Relative 4 %   Monocytes Absolute 0.7 0.1 - 1.0 K/uL   Eosinophils Relative 0 %   Eosinophils Absolute 0.0 0.0 - 0.5 K/uL   Basophils Relative 0 %   Basophils Absolute 0.1 0.0 - 0.1 K/uL   Immature Granulocytes 2 %   Abs Immature Granulocytes 0.27 (H) 0.00 - 0.07 K/uL    Comment: Performed at [facility] [address] [place], [place] [address]  Comprehensive metabolic panel     Status: Abnormal   Collection Time: 11/12/21  3:08 PM  Result Value Ref Range   Sodium 126 (L) 135 - 145 mmol/L   Potassium 3.0 (L) 3.5 - 5.1 mmol/L   Chloride 93 (L) 98 - 111 mmol/L   CO2 20 (L) 22 - 32 mmol/L   Glucose, Bld 417 (H) 70 - 99 mg/dL    Comment: Glucose reference  range applies only to samples taken after fasting for at least 8 hours.   BUN 16 6 - 20 mg/dL   Creatinine, Ser 1.26 (H) 0.44 - 1.00 mg/dL   Calcium 7.9 (L) 8.9 - 10.3 mg/dL   Total Protein 7.0 6.5 - 8.1 g/dL   Albumin 2.5 (L) 3.5 - 5.0 g/dL   AST 12 (L) 15 - 41 U/L   ALT 12 0 - 44 U/L   Alkaline Phosphatase 58 38 - 126 U/L   Total Bilirubin 0.3 0.3 - 1.2 mg/dL   GFR, Estimated 50 (L) >60 mL/min    Comment: (NOTE) Calculated using the CKD-EPI Creatinine Equation (2021)    Anion gap 13 5 - 15    Comment: Performed at [facility], [place] [address], [place], [place] [address]  ABO/Rh     Status: None   Collection Time: 11/12/21  3:08 PM  Result Value Ref Range   ABO/RH(D)      O POS Performed at [facility] [address], [place], [place] [address]   I-Stat beta hCG blood, ED     Status: None   Collection Time: 11/12/21  3:13 PM  Result Value Ref Range   I-stat hCG, quantitative <5.0 <5 mIU/mL   Comment 3            Comment:   GEST. AGE      CONC.  (mIU/mL)   <=1 WEEK        5 - 50     2 WEEKS       50 - 500     3 WEEKS       100 - 10,000     4 WEEKS     1,000 - 30,000        FEMALE AND NON-PREGNANT FEMALE:     LESS THAN 5 mIU/mL   I-stat chem 8, ED     Status: Abnormal   Collection Time: 11/12/21  3:14 PM  Result Value Ref Range   Sodium 129 (L) 135 - 145 mmol/L   Potassium 3.1 (L) 3.5 - 5.1 mmol/L   Chloride 97 (L) 98 - 111 mmol/L   BUN 15 6 - 20 mg/dL   Creatinine, Ser 1.10 (H) 0.44 - 1.00 mg/dL   Glucose, Bld 412 (H) 70 -  99 mg/dL    Comment: Glucose reference range applies only to samples taken after fasting for at least 8 hours.   Calcium, Ion 1.02 (L) 1.15 - 1.40 mmol/L   TCO2 22 22 - 32 mmol/L   Hemoglobin 9.9 (L) 12.0 - 15.0 g/dL   HCT 29.0 (L) 36.0 - 46.0 %  Lactic acid, plasma     Status: None   Collection Time: 11/12/21  3:18 PM  Result Value Ref Range   Lactic Acid, Venous 1.5 0.5 - 1.9 mmol/L    Comment: Performed at [facility] [address], [place], [place] [address]  Troponin I (High Sensitivity)     Status: Abnormal   Collection Time: 11/12/21  3:19 PM  Result Value Ref Range   Troponin I (High Sensitivity) 40 (H) <18 ng/L    Comment: (NOTE) Elevated high sensitivity troponin I (hsTnI) values and significant  changes across serial measurements may suggest ACS but many other  chronic and acute conditions are known to elevate hsTnI results.  Refer to the "Links" section for chest pain algorithms and additional  guidance. Performed at [facility], [place] [address], [place], [place] [address]   Ethanol     Status: None   Collection Time: 11/12/21  3:21 PM  Result Value Ref Range   Alcohol, Ethyl (B) <10 <10 mg/dL    Comment: (NOTE) Lowest detectable limit for serum alcohol is 10 mg/dL.  For medical purposes only. Performed at [facility], [place] [address] [place], [place] [address]   Beta-hydroxybutyric acid     Status: None   Collection Time: 11/12/21  3:21 PM  Result Value Ref Range   Beta-Hydroxybutyric Acid 0.22 0.05 - 0.27 mmol/L    Comment: Performed at [facility] [address], [place], [place] [address]  Respiratory (~20 pathogens) panel by PCR     Status: None   Collection Time: 11/12/21  3:37 PM   Specimen: Nasopharyngeal Swab; Respiratory  Result Value Ref Range   Adenovirus NOT DETECTED NOT DETECTED   Coronavirus 229E NOT DETECTED NOT DETECTED    Comment: (NOTE) The Coronavirus on the Respiratory Panel, DOES NOT test for the novel  Coronavirus (2019 nCoV)    Coronavirus HKU1 NOT DETECTED NOT DETECTED   Coronavirus NL63 NOT DETECTED NOT DETECTED   Coronavirus OC43 NOT DETECTED NOT DETECTED   Metapneumovirus NOT DETECTED NOT DETECTED   Rhinovirus / Enterovirus NOT DETECTED NOT DETECTED   Influenza A NOT DETECTED NOT DETECTED   Influenza B NOT DETECTED NOT DETECTED   Parainfluenza Virus 1 NOT DETECTED NOT DETECTED   Parainfluenza Virus 2 NOT DETECTED NOT  DETECTED   Parainfluenza Virus 3 NOT DETECTED NOT DETECTED   Parainfluenza Virus 4 NOT DETECTED NOT DETECTED   Respiratory Syncytial Virus NOT DETECTED NOT DETECTED   Bordetella pertussis NOT DETECTED NOT DETECTED   Bordetella Parapertussis NOT DETECTED NOT DETECTED   Chlamydophila pneumoniae NOT DETECTED NOT DETECTED   Mycoplasma pneumoniae NOT DETECTED NOT DETECTED    Comment: Performed at [facility], [place] [address], [place], [place] [address]  Blood Culture (routine x 2)     Status: None (Preliminary result)   Collection Time: 11/12/21  4:23 PM   Specimen: Site Not Specified; Blood  Result Value Ref Range   Specimen Description SITE NOT SPECIFIED    Special Requests      BOTTLES DRAWN AEROBIC AND ANAEROBIC Blood Culture adequate volume   Culture  NO GROWTH < 24 HOURS Performed at [facility] [address] [place], [place] [address]    Report Status PENDING   Resp Panel by RT-PCR (Flu A&B, Covid) Nasopharyngeal Swab     Status: None   Collection Time: 11/12/21  4:25 PM   Specimen: Nasopharyngeal Swab; Nasopharyngeal(NP) swabs in vial transport medium  Result Value Ref Range   SARS Coronavirus 2 by RT PCR NEGATIVE NEGATIVE    Comment: (NOTE) SARS-CoV-2 target nucleic acids are NOT DETECTED.  The SARS-CoV-2 RNA is generally detectable in upper respiratory specimens during the acute phase of infection. The lowest concentration of SARS-CoV-2 viral copies this assay can detect is 138 copies/mL. A negative result does not preclude SARS-Cov-2 infection and should not be used as the sole basis for treatment or other patient management decisions. A negative result may occur with  improper specimen collection/handling, submission of specimen other than nasopharyngeal swab, presence of viral mutation(s) within the areas targeted by this assay, and inadequate number of viral copies(<138 copies/mL). A negative result must be combined with clinical observations,  patient history, and epidemiological information. The expected result is Negative.  Fact Sheet for Patients:  EntrepreneurPulse.com.au  Fact Sheet for Healthcare Providers:  IncredibleEmployment.be  This test is no t yet approved or cleared by the [place] FDA and  has been authorized for detection and/or diagnosis of SARS-CoV-2 by FDA under an Emergency Use Authorization (EUA). This EUA will remain  in effect (meaning this test can be used) for the duration of the COVID-19 declaration under Section 564(b)(1) of the Act, 21 U.S.C.section 360bbb-3(b)(1), unless the authorization is terminated  or revoked sooner.       Influenza A by PCR NEGATIVE NEGATIVE   Influenza B by PCR NEGATIVE NEGATIVE    Comment: (NOTE) The Xpert Xpress SARS-CoV-2/FLU/RSV plus assay is intended as an aid in the diagnosis of influenza from Nasopharyngeal swab specimens and should not be used as a sole basis for treatment. Nasal washings and aspirates are unacceptable for Xpert Xpress SARS-CoV-2/FLU/RSV testing.  Fact Sheet for Patients: EntrepreneurPulse.com.au  Fact Sheet for Healthcare Providers: IncredibleEmployment.be  This test is not yet approved or cleared by the [place] FDA and has been authorized for detection and/or diagnosis of SARS-CoV-2 by FDA under an Emergency Use Authorization (EUA). This EUA will remain in effect (meaning this test can be used) for the duration of the COVID-19 declaration under Section 564(b)(1) of the Act, 21 U.S.C. section 360bbb-3(b)(1), unless the authorization is terminated or revoked.  Performed at [facility], [place] [address], [place], [place] [address]   Blood Culture (routine x 2)     Status: None (Preliminary result)   Collection Time: 11/12/21  4:44 PM   Specimen: Site Not Specified; Blood  Result Value Ref Range   Specimen Description SITE NOT SPECIFIED    Special  Requests      BOTTLES DRAWN AEROBIC AND ANAEROBIC Blood Culture results may not be optimal due to an excessive volume of blood received in culture bottles   Culture      NO GROWTH < 24 HOURS Performed at [facility] [address], [place], [place] [address]    Report Status PENDING   Troponin I (High Sensitivity)     Status: Abnormal   Collection Time: 11/12/21  5:19 PM  Result Value Ref Range   Troponin I (High Sensitivity) 41 (H) <18 ng/L    Comment: (NOTE) Elevated high sensitivity troponin I (hsTnI)  values and significant  changes across serial measurements may suggest ACS but many other  chronic and acute conditions are known to elevate hsTnI results.  Refer to the "Links" section for chest pain algorithms and additional  guidance. Performed at [facility], [place] [address] [place], [place] [address]   Prepare RBC (crossmatch)     Status: None   Collection Time: 11/12/21  5:48 PM  Result Value Ref Range   Order Confirmation      ORDER PROCESSED BY BLOOD BANK Performed at [facility], [place] [address] [place], [place] [address]   Urinalysis, Routine w reflex microscopic     Status: Abnormal   Collection Time: 11/12/21  5:54 PM  Result Value Ref Range   Color, Urine YELLOW YELLOW   APPearance CLEAR CLEAR   Specific Gravity, Urine <1.005 (L) 1.005 - 1.030   pH 6.0 5.0 - 8.0   Glucose, UA 250 (A) NEGATIVE mg/dL   Hgb urine dipstick MODERATE (A) NEGATIVE   Bilirubin Urine NEGATIVE NEGATIVE   Ketones, ur NEGATIVE NEGATIVE mg/dL   Protein, ur NEGATIVE NEGATIVE mg/dL   Nitrite NEGATIVE NEGATIVE   Leukocytes,Ua LARGE (A) NEGATIVE    Comment: Performed at [facility] [address], [place], [place] [address]  Rapid urine drug screen (hospital performed)     Status: Abnormal   Collection Time: 11/12/21  5:54 PM  Result Value Ref Range   Opiates NONE DETECTED NONE DETECTED   Cocaine NONE DETECTED NONE DETECTED   Benzodiazepines POSITIVE (A) NONE  DETECTED   Amphetamines NONE DETECTED NONE DETECTED   Tetrahydrocannabinol NONE DETECTED NONE DETECTED   Barbiturates NONE DETECTED NONE DETECTED    Comment: (NOTE) DRUG SCREEN FOR MEDICAL PURPOSES ONLY.  IF CONFIRMATION IS NEEDED FOR ANY PURPOSE, NOTIFY LAB WITHIN 5 DAYS.  LOWEST DETECTABLE LIMITS FOR URINE DRUG SCREEN Drug Class                     Cutoff (ng/mL) Amphetamine and metabolites    1000 Barbiturate and metabolites    200 Benzodiazepine                 503 Tricyclics and metabolites     300 Opiates and metabolites        300 Cocaine and metabolites        300 THC                            50 Performed at [facility], [place] [address], [place], [place] [address]   Urinalysis, Microscopic (reflex)     Status: Abnormal   Collection Time: 11/12/21  5:54 PM  Result Value Ref Range   RBC / HPF 11-20 0 - 5 RBC/hpf   WBC, UA 11-20 0 - 5 WBC/hpf   Bacteria, UA FEW (A) NONE SEEN   Squamous Epithelial / LPF 0-5 0 - 5    Comment: Performed at [facility], [place] [address] [place], [place] [address]  I-Stat venous blood gas, ED     Status: Abnormal   Collection Time: 11/12/21  5:59 PM  Result Value Ref Range   pH, Ven 7.294 7.25 - 7.43   pCO2, Ven 48.9 44 - 60 mmHg   pO2, Ven 39 32 - 45 mmHg   Bicarbonate 23.7 20.0 - 28.0 mmol/L   TCO2 25 22 - 32 mmol/L   O2 Saturation 67 %  Acid-base deficit 3.0 (H) 0.0 - 2.0 mmol/L   Sodium 134 (L) 135 - 145 mmol/L   Potassium 3.3 (L) 3.5 - 5.1 mmol/L   Calcium, Ion 1.05 (L) 1.15 - 1.40 mmol/L   HCT 29.0 (L) 36.0 - 46.0 %   Hemoglobin 9.9 (L) 12.0 - 15.0 g/dL   Sample type VENOUS    Comment NOTIFIED PHYSICIAN   Type and screen Ordered by PROVIDER DEFAULT     Status: None   Collection Time: 11/12/21  6:02 PM  Result Value Ref Range   ABO/RH(D) O POS    Antibody Screen NEG    Sample Expiration 11/15/2021,2359    Unit Number [id number]    Blood Component Type RED CELLS,LR    Unit division 00    Status of Unit  ISSUED,FINAL    Unit tag comment VERBAL ORDERS PER DR DR.TRIFAN    Transfusion Status OK TO TRANSFUSE    Crossmatch Result COMPATIBLE    Unit Number [id number]    Blood Component Type RBC LR PHER1    Unit division 00    Status of Unit ISSUED,FINAL    Unit tag comment VERBAL ORDERS PER DR DR.TRIFAN    Transfusion Status OK TO TRANSFUSE    Crossmatch Result COMPATIBLE   Troponin I (High Sensitivity)     Status: Abnormal   Collection Time: 11/12/21  6:10 PM  Result Value Ref Range   Troponin I (High Sensitivity) 63 (H) <18 ng/L    Comment: M.MCIVER,RN 11/12/21 AT 1903 A.HUGHES RESULT CALLED TO, READ BACK BY AND VERIFIED WITH: (NOTE) Elevated high sensitivity troponin I (hsTnI) values and significant  changes across serial measurements may suggest ACS but many other  chronic and acute conditions are known to elevate hsTnI results.  Refer to the Links section for chest pain algorithms and additional  guidance. Performed at [facility], [place] [address] [place], [place] [address] CORRECTED ON 02/20 AT 1100: PREVIOUSLY REPORTED AS 63 M.MCIVER,RN 11/12/21 AT 1903 A.HUGHES   D-dimer, quantitative     Status: Abnormal   Collection Time: 11/12/21  6:10 PM  Result Value Ref Range   D-Dimer, Quant 2.36 (H) 0.00 - 0.50 ug/mL-FEU    Comment: (NOTE) At the manufacturer cut-off value of 0.5 g/mL FEU, this assay has a negative predictive value of 95-100%.This assay is intended for use in conjunction with a clinical pretest probability (PTP) assessment model to exclude pulmonary embolism (PE) and deep venous thrombosis (DVT) in outpatients suspected of PE or DVT. Results should be correlated with clinical presentation. Performed at [facility], [place] [address], [place], [place] [address]   Hemoglobin and hematocrit, blood     Status: Abnormal   Collection Time: 11/12/21  6:10 PM  Result Value Ref Range   Hemoglobin 8.7 (L) 12.0 - 15.0 g/dL   HCT 29.5 (L) 36.0 - 46.0 %     Comment: Performed at [facility], [place] [address], [place], [place] [address]  HIV Antibody (routine testing w rflx)     Status: None   Collection Time: 11/12/21  7:31 PM  Result Value Ref Range   HIV Screen 4th Generation wRfx Non Reactive Non Reactive    Comment: Performed at [facility], [place] [address], [place], [place] [address]  Lactic acid, plasma     Status: Abnormal   Collection Time: 11/12/21  7:31 PM  Result Value Ref Range   Lactic Acid, Venous 2.5 (HH) 0.5 - 1.9 mmol/L  Comment: CRITICAL RESULT CALLED TO, READ BACK BY AND VERIFIED WITH: REBECCA SABRINE RN 11/12/21 2104 Wiliam Ke Performed at [facility], [place] [address] [place], [place] [address]   Procalcitonin     Status: None   Collection Time: 11/12/21  7:31 PM  Result Value Ref Range   Procalcitonin 2.04 ng/mL    Comment:        Interpretation: PCT > 2 ng/mL: Systemic infection (sepsis) is likely, unless other causes are known. (NOTE)       Sepsis PCT Algorithm           Lower Respiratory Tract                                      Infection PCT Algorithm    ----------------------------     ----------------------------         PCT < 0.25 ng/mL                PCT < 0.10 ng/mL          Strongly encourage             Strongly discourage   discontinuation of antibiotics    initiation of antibiotics    ----------------------------     -----------------------------       PCT 0.25 - 0.50 ng/mL            PCT 0.10 - 0.25 ng/mL               OR       >80% decrease in PCT            Discourage initiation of                                            antibiotics      Encourage discontinuation           of antibiotics    ----------------------------     -----------------------------         PCT >= 0.50 ng/mL              PCT 0.26 - 0.50 ng/mL               AND       <80% decrease in PCT              Encourage initiation of                                             antibiotics       Encourage  continuation           of antibiotics    ----------------------------     -----------------------------        PCT >= 0.50 ng/mL                  PCT > 0.50 ng/mL               AND         increase in PCT                  Strongly encourage  initiation of antibiotics    Strongly encourage escalation           of antibiotics                                     -----------------------------                                           PCT <= 0.25 ng/mL                                                 OR                                        > 80% decrease in PCT                                      Discontinue / Do not initiate                                             antibiotics  Performed at [facility], [address], [place], [place] [address]   Magnesium     Status: None   Collection Time: 11/12/21  7:31 PM  Result Value Ref Range   Magnesium 2.0 1.7 - 2.4 mg/dL    Comment: Performed at [facility], [place] [address], [place], [place] [address]  Phosphorus     Status: Abnormal   Collection Time: 11/12/21  7:31 PM  Result Value Ref Range   Phosphorus 5.7 (H) 2.5 - 4.6 mg/dL    Comment: Performed at [facility] [address], [place], [place] [address]  Hemoglobin A1c     Status: Abnormal   Collection Time: 11/12/21  7:31 PM  Result Value Ref Range   Hgb A1c MFr Bld 7.2 (H) 4.8 - 5.6 %    Comment: (NOTE) Pre diabetes:          5.7%-6.4%  Diabetes:              >6.4%  Glycemic control for   <7.0% adults with diabetes    Mean Plasma Glucose 159.94 mg/dL    Comment: Performed at [facility] [address] [place], [place] [address]  Acetaminophen level     Status: Abnormal   Collection Time: 11/12/21  7:31 PM  Result Value Ref Range   Acetaminophen (Tylenol), Serum <10 (L) 10 - 30 ug/mL    Comment: (NOTE) Therapeutic concentrations vary significantly. A range of 10-30 ug/mL  may be an effective  concentration for many patients. However, some  are best treated at concentrations outside of this range. Acetaminophen concentrations >150 ug/mL at 4 hours after ingestion  and >50 ug/mL at 12 hours after ingestion are often associated with  toxic reactions.  Performed at [facility], [place],  [place] [address]   Salicylate level     Status: Abnormal   Collection Time: 11/12/21  7:31 PM  Result Value Ref Range   Salicylate Lvl <5.4 (L) 7.0 - 30.0 mg/dL    Comment: Performed at [facility] [address] [place], [place] [address]  Hemoglobin and hematocrit, blood     Status: Abnormal   Collection Time: 11/12/21  7:31 PM  Result Value Ref Range   Hemoglobin 11.7 (L) 12.0 - 15.0 g/dL    Comment: REPEATED TO VERIFY   HCT 37.7 36.0 - 46.0 %    Comment: Performed at [facility] [address], [place], [place] [address]  Troponin I (High Sensitivity)     Status: Abnormal   Collection Time: 11/12/21  7:31 PM  Result Value Ref Range   Troponin I (High Sensitivity) 59 (H) <18 ng/L    Comment: (NOTE) Elevated high sensitivity troponin I (hsTnI) values and significant  changes across serial measurements may suggest ACS but many other  chronic and acute conditions are known to elevate hsTnI results.  Refer to the Links section for chest pain algorithms and additional  guidance. Performed at [facility], [place] [address] [place], [place] [address]   MRSA Next Gen by PCR, Nasal     Status: Abnormal   Collection Time: 11/12/21  7:36 PM   Specimen: Nasal Mucosa; Nasal Swab  Result Value Ref Range   MRSA by PCR Next Gen DETECTED (A) NOT DETECTED    Comment: RESULT CALLED TO, READ BACK BY AND VERIFIED WITH: V [email or web address],RN_[email or web address]  11/12/21 [place] (NOTE) The GeneXpert MRSA Assay (FDA approved for NASAL specimens only), is one component of a comprehensive MRSA colonization surveillance program. It is not intended to diagnose MRSA infection nor to guide or  monitor treatment for MRSA infections. Test performance is not FDA approved in patients less than 76 years old. Performed at [facility], [place] [address], [place], [place] [address]   I-STAT 7, (LYTES, BLD GAS, ICA, H+H)     Status: Abnormal   Collection Time: 11/12/21  7:46 PM  Result Value Ref Range   pH, Arterial 7.212 (L) 7.35 - 7.45   pCO2 arterial 74.0 (HH) 32 - 48 mmHg   pO2, Arterial 140 (H) 83 - 108 mmHg   Bicarbonate 29.1 (H) 20.0 - 28.0 mmol/L   TCO2 31 22 - 32 mmol/L   O2 Saturation 98 %   Acid-Base Excess 0.0 0.0 - 2.0 mmol/L   Sodium 131 (L) 135 - 145 mmol/L   Potassium 4.0 3.5 - 5.1 mmol/L   Calcium, Ion 1.10 (L) 1.15 - 1.40 mmol/L   HCT 36.0 36.0 - 46.0 %   Hemoglobin 12.2 12.0 - 15.0 g/dL   Patient temperature 38.6 C    Collection site RADIAL, ALLEN'S TEST ACCEPTABLE    Drawn by Operator    Sample type ARTERIAL    Comment NOTIFIED PHYSICIAN   Glucose, capillary     Status: Abnormal   Collection Time: 11/12/21  9:02 PM  Result Value Ref Range   Glucose-Capillary 323 (H) 70 - 99 mg/dL    Comment: Glucose reference range applies only to samples taken after fasting for at least 8 hours.  Lactic acid, plasma     Status: None   Collection Time: 11/12/21 10:33 PM  Result Value Ref Range   Lactic Acid, Venous 1.9 0.5 - 1.9 mmol/L    Comment: Performed at [facility], [address] [place], [place] [address]  Hemoglobin and hematocrit, blood     Status: Abnormal   Collection Time: 11/12/21 10:33 PM  Result Value Ref Range   Hemoglobin 9.3 (L) 12.0 - 15.0 g/dL   HCT 31.3 (L) 36.0 - 46.0 %    Comment: Performed at [facility] [address], [place], [place] [address]  Glucose, capillary     Status: Abnormal   Collection Time: 11/13/21 12:14 AM  Result Value Ref Range   Glucose-Capillary 150 (H) 70 - 99 mg/dL    Comment: Glucose reference range applies only to samples taken after fasting for at least 8 hours.  Culture, Respiratory w Gram  Stain     Status: None (Preliminary result)   Collection Time: 11/13/21  2:09 AM   Specimen: Bronchoalveolar Lavage; Respiratory  Result Value Ref Range   Specimen Description BRONCHIAL ALVEOLAR LAVAGE    Special Requests NONE    Gram Stain      MODERATE WBC PRESENT, PREDOMINANTLY PMN FEW GRAM POSITIVE COCCI IN PAIRS AND CHAINS Performed at [facility], [address], [place], [place] [address]    Culture PENDING    Report Status PENDING   Triglycerides     Status: Abnormal   Collection Time: 11/13/21  2:28 AM  Result Value Ref Range   Triglycerides 314 (H) <150 mg/dL    Comment: Performed at [facility] [address] [place], [place] [address]  CBC     Status: Abnormal   Collection Time: 11/13/21  2:28 AM  Result Value Ref Range   WBC 30.2 (H) 4.0 - 10.5 K/uL   RBC 3.90 3.87 - 5.11 MIL/uL   Hemoglobin 10.7 (L) 12.0 - 15.0 g/dL   HCT 35.1 (L) 36.0 - 46.0 %   MCV 90.0 80.0 - 100.0 fL   MCH 27.4 26.0 - 34.0 pg   MCHC 30.5 30.0 - 36.0 g/dL   RDW 16.1 (H) 11.5 - 15.5 %   Platelets 214 150 - 400 K/uL   nRBC 0.1 0.0 - 0.2 %    Comment: Performed at [facility], [place] [address] [place], [place] [address]  Comprehensive metabolic panel     Status: Abnormal   Collection Time: 11/13/21  2:28 AM  Result Value Ref Range   Sodium 135 135 - 145 mmol/L   Potassium 3.8 3.5 - 5.1 mmol/L   Chloride 99 98 - 111 mmol/L   CO2 26 22 - 32 mmol/L   Glucose, Bld 125 (H) 70 - 99 mg/dL    Comment: Glucose reference range applies only to samples taken after fasting for at least 8 hours.   BUN 14 6 - 20 mg/dL   Creatinine, Ser 1.10 (H) 0.44 - 1.00 mg/dL   Calcium 7.8 (L) 8.9 - 10.3 mg/dL   Total Protein 6.4 (L) 6.5 - 8.1 g/dL   Albumin 2.3 (L) 3.5 - 5.0 g/dL   AST 84 (H) 15 - 41 U/L   ALT 51 (H) 0 - 44 U/L   Alkaline Phosphatase 65 38 - 126 U/L   Total Bilirubin 0.2 (L) 0.3 - 1.2 mg/dL   GFR, Estimated 59 (L) >60 mL/min    Comment: (NOTE) Calculated using the CKD-EPI  Creatinine Equation (2021)    Anion gap 10 5 - 15    Comment: Performed at [facility] [address] [place], [place] [address]  Glucose, capillary     Status: Abnormal  Collection Time: 11/13/21  3:43 AM  Result Value Ref Range   Glucose-Capillary 107 (H) 70 - 99 mg/dL    Comment: Glucose reference range applies only to samples taken after fasting for at least 8 hours.  I-STAT 7, (LYTES, BLD GAS, ICA, H+H)     Status: Abnormal   Collection Time: 11/13/21  3:53 AM  Result Value Ref Range   pH, Arterial 7.255 (L) 7.35 - 7.45   pCO2 arterial 63.0 (H) 32 - 48 mmHg   pO2, Arterial 69 (L) 83 - 108 mmHg   Bicarbonate 28.0 20.0 - 28.0 mmol/L   TCO2 30 22 - 32 mmol/L   O2 Saturation 90 %   Acid-Base Excess 0.0 0.0 - 2.0 mmol/L   Sodium 134 (L) 135 - 145 mmol/L   Potassium 3.8 3.5 - 5.1 mmol/L   Calcium, Ion 1.14 (L) 1.15 - 1.40 mmol/L   HCT 34.0 (L) 36.0 - 46.0 %   Hemoglobin 11.6 (L) 12.0 - 15.0 g/dL   Patient temperature 37.0 C    Collection site RADIAL, ALLEN'S TEST ACCEPTABLE    Drawn by Operator    Sample type ARTERIAL   Glucose, capillary     Status: Abnormal   Collection Time: 11/13/21  7:34 AM  Result Value Ref Range   Glucose-Capillary 110 (H) 70 - 99 mg/dL    Comment: Glucose reference range applies only to samples taken after fasting for at least 8 hours.  Glucose, capillary     Status: None   Collection Time: 11/13/21 11:03 AM  Result Value Ref Range   Glucose-Capillary 82 70 - 99 mg/dL    Comment: Glucose reference range applies only to samples taken after fasting for at least 8 hours.  I-STAT 7, (LYTES, BLD GAS, ICA, H+H)     Status: Abnormal   Collection Time: 11/13/21  3:40 PM  Result Value Ref Range   pH, Arterial 7.249 (L) 7.35 - 7.45   pCO2 arterial 61.4 (H) 32 - 48 mmHg   pO2, Arterial 85 83 - 108 mmHg   Bicarbonate 26.8 20.0 - 28.0 mmol/L   TCO2 29 22 - 32 mmol/L   O2 Saturation 94 %   Acid-base deficit 1.0 0.0 - 2.0 mmol/L   Sodium 132 (L) 135  - 145 mmol/L   Potassium 3.5 3.5 - 5.1 mmol/L   Calcium, Ion 1.10 (L) 1.15 - 1.40 mmol/L   HCT 36.0 36.0 - 46.0 %   Hemoglobin 12.2 12.0 - 15.0 g/dL   Patient temperature 37.3 C    Collection site RADIAL, ALLEN'S TEST ACCEPTABLE    Drawn by RT    Sample type ARTERIAL   Glucose, capillary     Status: Abnormal   Collection Time: 11/13/21  3:55 PM  Result Value Ref Range   Glucose-Capillary 119 (H) 70 - 99 mg/dL    Comment: Glucose reference range applies only to samples taken after fasting for at least 8 hours.  I-STAT 7, (LYTES, BLD GAS, ICA, H+H)     Status: Abnormal   Collection Time: 11/13/21  6:21 PM  Result Value Ref Range   pH, Arterial 7.250 (L) 7.35 - 7.45   pCO2 arterial 52.5 (H) 32 - 48 mmHg   pO2, Arterial 68 (L) 83 - 108 mmHg   Bicarbonate 23.0 20.0 - 28.0 mmol/L   TCO2 25 22 - 32 mmol/L   O2 Saturation 89 %   Acid-base deficit 4.0 (H) 0.0 - 2.0 mmol/L   Sodium 133 (L) 135 -  145 mmol/L   Potassium 3.1 (L) 3.5 - 5.1 mmol/L   Calcium, Ion 1.08 (L) 1.15 - 1.40 mmol/L   HCT 32.0 (L) 36.0 - 46.0 %   Hemoglobin 10.9 (L) 12.0 - 15.0 g/dL   Patient temperature 37.1 C    Sample type ARTERIAL   Glucose, capillary     Status: Abnormal   Collection Time: 11/13/21  7:57 PM  Result Value Ref Range   Glucose-Capillary 192 (H) 70 - 99 mg/dL    Comment: Glucose reference range applies only to samples taken after fasting for at least 8 hours.  I-STAT 7, (LYTES, BLD GAS, ICA, H+H)     Status: Abnormal   Collection Time: 11/13/21  8:31 PM  Result Value Ref Range   pH, Arterial 7.273 (L) 7.35 - 7.45   pCO2 arterial 53.5 (H) 32 - 48 mmHg   pO2, Arterial 75 (L) 83 - 108 mmHg   Bicarbonate 24.7 20.0 - 28.0 mmol/L   TCO2 26 22 - 32 mmol/L   O2 Saturation 92 %   Acid-base deficit 3.0 (H) 0.0 - 2.0 mmol/L   Sodium 130 (L) 135 - 145 mmol/L   Potassium 3.6 3.5 - 5.1 mmol/L   Calcium, Ion 1.11 (L) 1.15 - 1.40 mmol/L   HCT 34.0 (L) 36.0 - 46.0 %   Hemoglobin 11.6 (L) 12.0 - 15.0 g/dL    Patient temperature 37.1 C    Collection site art line    Drawn by HIDE    Sample type ARTERIAL   I-STAT 7, (LYTES, BLD GAS, ICA, H+H)     Status: Abnormal   Collection Time: 11/13/21  9:10 PM  Result Value Ref Range   pH, Arterial 7.265 (L) 7.35 - 7.45   pCO2 arterial 52.8 (H) 32 - 48 mmHg   pO2, Arterial 76 (L) 83 - 108 mmHg   Bicarbonate 23.9 20.0 - 28.0 mmol/L   TCO2 26 22 - 32 mmol/L   O2 Saturation 92 %   Acid-base deficit 3.0 (H) 0.0 - 2.0 mmol/L   Sodium 131 (L) 135 - 145 mmol/L   Potassium 3.4 (L) 3.5 - 5.1 mmol/L   Calcium, Ion 1.09 (L) 1.15 - 1.40 mmol/L   HCT 33.0 (L) 36.0 - 46.0 %   Hemoglobin 11.2 (L) 12.0 - 15.0 g/dL   Patient temperature 37.0 C    Collection site art line    Drawn by RT    Sample type ARTERIAL   Glucose, capillary     Status: Abnormal   Collection Time: 11/13/21 11:48 PM  Result Value Ref Range   Glucose-Capillary 224 (H) 70 - 99 mg/dL    Comment: Glucose reference range applies only to samples taken after fasting for at least 8 hours.  Glucose, capillary     Status: Abnormal   Collection Time: 11/14/21  3:06 AM  Result Value Ref Range   Glucose-Capillary 218 (H) 70 - 99 mg/dL    Comment: Glucose reference range applies only to samples taken after fasting for at least 8 hours.  CBC     Status: Abnormal   Collection Time: 11/14/21  3:15 AM  Result Value Ref Range   WBC 26.7 (H) 4.0 - 10.5 K/uL   RBC 3.63 (L) 3.87 - 5.11 MIL/uL   Hemoglobin 10.1 (L) 12.0 - 15.0 g/dL   HCT 32.8 (L) 36.0 - 46.0 %   MCV 90.4 80.0 - 100.0 fL   MCH 27.8 26.0 - 34.0 pg   MCHC 30.8 30.0 -  36.0 g/dL   RDW 16.0 (H) 11.5 - 15.5 %   Platelets 181 150 - 400 K/uL   nRBC 0.1 0.0 - 0.2 %    Comment: Performed at [facility], [place] [address], [place], [place] [address]  Basic metabolic panel     Status: Abnormal   Collection Time: 11/14/21  3:15 AM  Result Value Ref Range   Sodium 131 (L) 135 - 145 mmol/L   Potassium 3.3 (L) 3.5 - 5.1 mmol/L   Chloride 96 (L)  98 - 111 mmol/L   CO2 25 22 - 32 mmol/L   Glucose, Bld 213 (H) 70 - 99 mg/dL    Comment: Glucose reference range applies only to samples taken after fasting for at least 8 hours.   BUN 12 6 - 20 mg/dL   Creatinine, Ser 1.06 (H) 0.44 - 1.00 mg/dL   Calcium 7.6 (L) 8.9 - 10.3 mg/dL   GFR, Estimated >60 >60 mL/min    Comment: (NOTE) Calculated using the CKD-EPI Creatinine Equation (2021)    Anion gap 10 5 - 15    Comment: Performed at [facility] [address] [place], [place] [address]    Recent Results (from the past 240 hour(s))  Respiratory (~20 pathogens) panel by PCR     Status: None   Collection Time: 11/12/21  3:37 PM   Specimen: Nasopharyngeal Swab; Respiratory  Result Value Ref Range Status   Adenovirus NOT DETECTED NOT DETECTED Final   Coronavirus 229E NOT DETECTED NOT DETECTED Final    Comment: (NOTE) The Coronavirus on the Respiratory Panel, DOES NOT test for the novel  Coronavirus (2019 nCoV)    Coronavirus HKU1 NOT DETECTED NOT DETECTED Final   Coronavirus NL63 NOT DETECTED NOT DETECTED Final   Coronavirus OC43 NOT DETECTED NOT DETECTED Final   Metapneumovirus NOT DETECTED NOT DETECTED Final   Rhinovirus / Enterovirus NOT DETECTED NOT DETECTED Final   Influenza A NOT DETECTED NOT DETECTED Final   Influenza B NOT DETECTED NOT DETECTED Final   Parainfluenza Virus 1 NOT DETECTED NOT DETECTED Final   Parainfluenza Virus 2 NOT DETECTED NOT DETECTED Final   Parainfluenza Virus 3 NOT DETECTED NOT DETECTED Final   Parainfluenza Virus 4 NOT DETECTED NOT DETECTED Final   Respiratory Syncytial Virus NOT DETECTED NOT DETECTED Final   Bordetella pertussis NOT DETECTED NOT DETECTED Final   Bordetella Parapertussis NOT DETECTED NOT DETECTED Final   Chlamydophila pneumoniae NOT DETECTED NOT DETECTED Final   Mycoplasma pneumoniae NOT DETECTED NOT DETECTED Final    Comment: Performed at [facility], [address], [place], [place] [address]  Blood Culture  (routine x 2)     Status: None (Preliminary result)   Collection Time: 11/12/21  4:23 PM   Specimen: Site Not Specified; Blood  Result Value Ref Range Status   Specimen Description SITE NOT SPECIFIED  Final   Special Requests   Final    BOTTLES DRAWN AEROBIC AND ANAEROBIC Blood Culture adequate volume   Culture   Final    NO GROWTH < 24 HOURS Performed at [facility], [place] [address], [place], [place] [address]    Report Status PENDING  Incomplete  Resp Panel by RT-PCR (Flu A&B, Covid) Nasopharyngeal Swab     Status: None   Collection Time: 11/12/21  4:25 PM   Specimen: Nasopharyngeal Swab; Nasopharyngeal(NP) swabs in vial transport medium  Result Value Ref Range Status   SARS Coronavirus 2 by RT PCR  NEGATIVE NEGATIVE Final    Comment: (NOTE) SARS-CoV-2 target nucleic acids are NOT DETECTED.  The SARS-CoV-2 RNA is generally detectable in upper respiratory specimens during the acute phase of infection. The lowest concentration of SARS-CoV-2 viral copies this assay can detect is 138 copies/mL. A negative result does not preclude SARS-Cov-2 infection and should not be used as the sole basis for treatment or other patient management decisions. A negative result may occur with  improper specimen collection/handling, submission of specimen other than nasopharyngeal swab, presence of viral mutation(s) within the areas targeted by this assay, and inadequate number of viral copies(<138 copies/mL). A negative result must be combined with clinical observations, patient history, and epidemiological information. The expected result is Negative.  Fact Sheet for Patients:  EntrepreneurPulse.com.au  Fact Sheet for Healthcare Providers:  IncredibleEmployment.be  This test is no t yet approved or cleared by the [place] FDA and  has been authorized for detection and/or diagnosis of SARS-CoV-2 by FDA under an Emergency Use Authorization (EUA). This  EUA will remain  in effect (meaning this test can be used) for the duration of the COVID-19 declaration under Section 564(b)(1) of the Act, 21 U.S.C.section 360bbb-3(b)(1), unless the authorization is terminated  or revoked sooner.       Influenza A by PCR NEGATIVE NEGATIVE Final   Influenza B by PCR NEGATIVE NEGATIVE Final    Comment: (NOTE) The Xpert Xpress SARS-CoV-2/FLU/RSV plus assay is intended as an aid in the diagnosis of influenza from Nasopharyngeal swab specimens and should not be used as a sole basis for treatment. Nasal washings and aspirates are unacceptable for Xpert Xpress SARS-CoV-2/FLU/RSV testing.  Fact Sheet for Patients: EntrepreneurPulse.com.au  Fact Sheet for Healthcare Providers: IncredibleEmployment.be  This test is not yet approved or cleared by the [place] FDA and has been authorized for detection and/or diagnosis of SARS-CoV-2 by FDA under an Emergency Use Authorization (EUA). This EUA will remain in effect (meaning this test can be used) for the duration of the COVID-19 declaration under Section 564(b)(1) of the Act, 21 U.S.C. section 360bbb-3(b)(1), unless the authorization is terminated or revoked.  Performed at [facility], [place] [address], [place], [place] [address]   Blood Culture (routine x 2)     Status: None (Preliminary result)   Collection Time: 11/12/21  4:44 PM   Specimen: Site Not Specified; Blood  Result Value Ref Range Status   Specimen Description SITE NOT SPECIFIED  Final   Special Requests   Final    BOTTLES DRAWN AEROBIC AND ANAEROBIC Blood Culture results may not be optimal due to an excessive volume of blood received in culture bottles   Culture   Final    NO GROWTH < 24 HOURS Performed at [facility] [address], [place], [place] [address]    Report Status PENDING  Incomplete  MRSA Next Gen by PCR, Nasal     Status: Abnormal   Collection Time: 11/12/21  7:36 PM    Specimen: Nasal Mucosa; Nasal Swab  Result Value Ref Range Status   MRSA by PCR Next Gen DETECTED (A) NOT DETECTED Final    Comment: RESULT CALLED TO, READ BACK BY AND VERIFIED WITH: V [email or web address]  11/12/21 [place] (NOTE) The GeneXpert MRSA Assay (FDA approved for NASAL specimens only), is one component of a comprehensive MRSA colonization surveillance program. It is not intended to diagnose MRSA infection nor to guide or monitor treatment for MRSA infections. Test performance is not FDA approved in patients less  than 52 years old. Performed at [facility], [place] [address], [place], [place] [address]   Culture, Respiratory w Gram Stain     Status: None (Preliminary result)   Collection Time: 11/13/21  2:09 AM   Specimen: Bronchoalveolar Lavage; Respiratory  Result Value Ref Range Status   Specimen Description BRONCHIAL ALVEOLAR LAVAGE  Final   Special Requests NONE  Final   Gram Stain   Final    MODERATE WBC PRESENT, PREDOMINANTLY PMN FEW GRAM POSITIVE COCCI IN PAIRS AND CHAINS Performed at [facility], [address], [place], [place] [address]    Culture PENDING  Incomplete   Report Status PENDING  Incomplete    Lipid Panel Recent Labs    11/13/21 0228  TRIG 314*    Studies/Results: CT HEAD WO CONTRAST (5MM)  Result Date: 11/12/2021 CLINICAL DATA:  Altered mental status following cardiac arrest, initial encounter EXAM: CT HEAD WITHOUT CONTRAST TECHNIQUE: Contiguous axial images were obtained from the base of the skull through the vertex without intravenous contrast. RADIATION DOSE REDUCTION: This exam was performed according to the departmental dose-optimization program which includes automated exposure control, adjustment of the mA and/or kV according to patient size and/or use of iterative reconstruction technique. COMPARISON:  CT from earlier in the same day. FINDINGS: Brain: No findings to suggest acute hemorrhage, acute infarction or space-occupying mass  lesion are noted. 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 No new focal abnormality is seen. Vascular: No hyperdense vessel or unexpected calcification. Skull: Normal. Negative for fracture or focal lesion. Sinuses/Orbits: No acute finding. Other: None. IMPRESSION: No acute abnormality noted. No significant change from the prior exam. Electronically Signed   By: Inez Catalina M.D.   On: 11/12/2021 19:14   CT Angio Chest PE W and/or Wo Contrast  Result Date: 11/12/2021 CLINICAL DATA:  Recent stroke-like symptoms, possible sepsis EXAM: CT ANGIOGRAPHY CHEST CT ABDOMEN AND PELVIS WITH CONTRAST TECHNIQUE: Multidetector CT imaging of the chest was performed using the standard protocol during bolus administration of intravenous contrast. Multiplanar CT image reconstructions and MIPs were obtained to evaluate the vascular anatomy. Multidetector CT imaging of the abdomen and pelvis was performed using the standard protocol during bolus administration of intravenous contrast. RADIATION DOSE REDUCTION: This exam was performed according to the departmental dose-optimization program which includes automated exposure control, adjustment of the mA and/or kV according to patient size and/or use of iterative reconstruction technique. CONTRAST:  174m OMNIPAQUE IOHEXOL 350 MG/ML SOLN COMPARISON:  Chest x-ray from earlier in the same day, CT from 09/08/2021 FINDINGS: CTA CHEST FINDINGS Cardiovascular: Atherosclerotic calcifications are noted. No aneurysmal dilatation or dissection of the thoracic aorta is seen. No cardiac enlargement is noted. Scattered coronary calcifications are noted. The pulmonary artery shows a normal branching pattern. No filling defect to suggest pulmonary embolism is seen. 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 Mediastinum/Nodes: Thoracic inlet is within normal limits. Endotracheal tube is noted in satisfactory position. Gastric catheter extends into the stomach. The esophagus  is within normal limits. No sizable hilar or mediastinal adenopathy is noted Lungs/Pleura: Lungs are well aerated bilaterally. Diffuse consolidation is noted in the medial aspect of the right lower lobe consistent with acute infiltrate. Some associated cavitary changes are noted. No sizable effusion is noted. No other focal infiltrate is noted. Mild left basilar atelectasis is seen. Well-formed bleb is noted in the medial aspect of the left lung base which was not present on the prior exam. Air-fluid level  is noted within. Previously noted effusions bilaterally have resolved in the interval. Musculoskeletal: Degenerative changes of the thoracic spine are noted. No acute rib abnormality is seen. Review of the MIP images confirms the above findings. CT ABDOMEN and PELVIS FINDINGS Hepatobiliary: Gallbladder is decompressed. Liver demonstrates a normal enhancement pattern with the exception of a geographic area of decreased attenuation in the lateral segment of the left lobe of the liver. This has a somewhat masslike appearance with hepatic contour change best seen on image number 26 of series 5. This was not present on the prior examination. Given the somewhat masslike density the possibility of a neoplastic disease could not be totally excluded given the patient's clinical history. Pancreas: Unremarkable. No pancreatic ductal dilatation or surrounding inflammatory changes. Spleen: Normal in size without focal abnormality. Adrenals/Urinary Tract: Adrenal glands are within normal limits. The left kidney demonstrates a normal enhancement pattern. No renal calculi or obstructive changes are noted. Right kidney has been surgically removed. Ureter is within normal limits. Bladder is incompletely distended. Stomach/Bowel: Hartmann's pouch is noted in the mid descending colon. Left upper quadrant colostomy is seen assistant with the patient's given clinical history. No obstructive or inflammatory changes are noted. The  appendix is within normal limits. No small bowel or gastric abnormality is noted. Vascular/Lymphatic: Aortic atherosclerosis. No enlarged abdominal or pelvic lymph nodes. Reproductive: Uterus and bilateral adnexa are unremarkable. Other: No abdominal wall hernia or abnormality. No abdominopelvic ascites. Musculoskeletal: No acute or significant osseous findings. Review of the MIP images confirms the above findings. IMPRESSION: CTA of the chest: No evidence of pulmonary emboli. Large mildly cavitary infiltrate in superior segment of right lower lobe consistent with acute pneumonia. Some minimal infiltrate is noted in the left base as well. Small bleb with air-fluid level within is noted in the medial aspect of the left base. These findings are all new from the prior exam. Resolution of previously seen effusions. 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 CT of the abdomen and pelvis: Postsurgical changes consistent with colostomy. Area of decreased attenuation and enhancement within the lateral segment of the left lobe of the liver with a somewhat mass like appearance along the margin of the liver. Possible metastatic disease would deserve consideration. Status post right nephrectomy. Electronically Signed   By: Inez Catalina M.D.   On: 11/12/2021 19:33   CT ABDOMEN PELVIS W CONTRAST  Result Date: 11/12/2021 CLINICAL DATA:  Recent stroke-like symptoms, possible sepsis EXAM: CT ANGIOGRAPHY CHEST CT ABDOMEN AND PELVIS WITH CONTRAST TECHNIQUE: Multidetector CT imaging of the chest was performed using the standard protocol during bolus administration of intravenous contrast. Multiplanar CT image reconstructions and MIPs were obtained to evaluate the vascular anatomy. Multidetector CT imaging of the abdomen and pelvis was performed using the standard protocol during bolus administration of intravenous contrast. RADIATION DOSE REDUCTION: This exam was performed according to the departmental dose-optimization  program which includes automated exposure control, adjustment of the mA and/or kV according to patient size and/or use of iterative reconstruction technique. CONTRAST:  154m OMNIPAQUE IOHEXOL 350 MG/ML SOLN COMPARISON:  Chest x-ray from earlier in the same day, CT from 09/08/2021 FINDINGS: CTA CHEST FINDINGS Cardiovascular: Atherosclerotic calcifications are noted. No aneurysmal dilatation or dissection of the thoracic aorta is seen. No cardiac enlargement is noted. Scattered coronary calcifications are noted. The pulmonary artery shows a normal branching pattern. No filling defect to suggest pulmonary embolism is seen. 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 Mediastinum/Nodes: Thoracic inlet  is within normal limits. Endotracheal tube is noted in satisfactory position. Gastric catheter extends into the stomach. The esophagus is within normal limits. No sizable hilar or mediastinal adenopathy is noted Lungs/Pleura: Lungs are well aerated bilaterally. Diffuse consolidation is noted in the medial aspect of the right lower lobe consistent with acute infiltrate. Some associated cavitary changes are noted. No sizable effusion is noted. No other focal infiltrate is noted. Mild left basilar atelectasis is seen. Well-formed bleb is noted in the medial aspect of the left lung base which was not present on the prior exam. Air-fluid level is noted within. Previously noted effusions bilaterally have resolved in the interval. Musculoskeletal: Degenerative changes of the thoracic spine are noted. No acute rib abnormality is seen. Review of the MIP images confirms the above findings. CT ABDOMEN and PELVIS FINDINGS Hepatobiliary: Gallbladder is decompressed. Liver demonstrates a normal enhancement pattern with the exception of a geographic area of decreased attenuation in the lateral segment of the left lobe of the liver. This has a somewhat masslike appearance with hepatic contour change best seen on  image number 26 of series 5. This was not present on the prior examination. Given the somewhat masslike density the possibility of a neoplastic disease could not be totally excluded given the patient's clinical history. Pancreas: Unremarkable. No pancreatic ductal dilatation or surrounding inflammatory changes. Spleen: Normal in size without focal abnormality. Adrenals/Urinary Tract: Adrenal glands are within normal limits. The left kidney demonstrates a normal enhancement pattern. No renal calculi or obstructive changes are noted. Right kidney has been surgically removed. Ureter is within normal limits. Bladder is incompletely distended. Stomach/Bowel: Hartmann's pouch is noted in the mid descending colon. Left upper quadrant colostomy is seen assistant with the patient's given clinical history. No obstructive or inflammatory changes are noted. The appendix is within normal limits. No small bowel or gastric abnormality is noted. Vascular/Lymphatic: Aortic atherosclerosis. No enlarged abdominal or pelvic lymph nodes. Reproductive: Uterus and bilateral adnexa are unremarkable. Other: No abdominal wall hernia or abnormality. No abdominopelvic ascites. Musculoskeletal: No acute or significant osseous findings. Review of the MIP images confirms the above findings. IMPRESSION: CTA of the chest: No evidence of pulmonary emboli. Large mildly cavitary infiltrate in superior segment of right lower lobe consistent with acute pneumonia. Some minimal infiltrate is noted in the left base as well. Small bleb with air-fluid level within is noted in the medial aspect of the left base. These findings are all new from the prior exam. Resolution of previously seen effusions. 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 CT of the abdomen and pelvis: Postsurgical changes consistent with colostomy. Area of decreased attenuation and enhancement within the lateral segment of the left lobe of the liver with a somewhat mass like  appearance along the margin of the liver. Possible metastatic disease would deserve consideration. Status post right nephrectomy. Electronically Signed   By: Inez Catalina M.D.   On: 11/12/2021 19:33   Portable Chest xray  Result Date: 11/13/2021 CLINICAL DATA:  A 56 year old female presents for evaluation of respiratory failure. EXAM: PORTABLE CHEST 1 VIEW COMPARISON:  Comparison is made with November 12, 2021. FINDINGS: Pacer defibrillator pad projects over the central chest. EKG leads project over the chest and upper abdomen. LEFT-sided central venous catheter terminates at the caval to atrial junction. Endotracheal tube approximately 3.9 cm from the carina. Cardiomediastinal contours and hilar structures are stable. Added density about the RIGHT hilum compatible with known RIGHT lower lobe perihilar masslike area seen on recent  imaging without gross change. No additional areas of consolidation or masslike appearance. No pneumothorax. On limited assessment there is no acute skeletal process. IMPRESSION: 1. Endotracheal tube approximately 3.9 cm from the carina. 2. No substantial change since recent imaging. 3. RIGHT perihilar masslike area may represent necrotic pneumonia, area without abnormality in September of 2022. Possibility of neoplasm is considered in this patient with history of cervical cancer. Short interval follow-up after therapy is suggested. Pulmonary consultation may be helpful if not yet obtained. Electronically Signed   By: Zetta Bills M.D.   On: 11/13/2021 08:04   DG CHEST PORT 1 VIEW  Result Date: 11/12/2021 CLINICAL DATA:  Central line placement. EXAM: PORTABLE CHEST 1 VIEW COMPARISON:  Radiograph earlier today. FINDINGS: New left subclavian central venous catheter tip overlies the lower SVC. No pneumothorax. Endotracheal tube and enteric tube remain in place. Stable heart size. Right perihilar masslike opacity characterized on CT earlier today. There is mild background bronchial  thickening. No significant pleural effusion. IMPRESSION: 1. New left subclavian central venous catheter with tip overlying the lower SVC. No pneumothorax. 2. Right perihilar masslike opacity, characterized on CT earlier today. Electronically Signed   By: Keith Rake M.D.   On: 11/12/2021 21:02   DG Chest Portable 1 View  Result Date: 11/12/2021 CLINICAL DATA:  ng/ett placement EXAM: PORTABLE CHEST 1 VIEW COMPARISON:  Same day chest radiograph. FINDINGS: Masslike prominence of the right hilum persists. Cardiac silhouette is mildly enlarged. No consolidation. No visible pleural effusions or pneumothorax on this semi upright AP radiograph. IMPRESSION: 1. Masslike prominence of the right hilum persists. Recommend CT chest (preferably with contrast) when able to exclude adenopathy and/or mass. 2. Endotracheal tube tip approximately 2.3 cm above the carina. 3. Gastric tube courses below the diaphragm with the tip outside the field of view. Electronically Signed   By: Margaretha Sheffield M.D.   On: 11/12/2021 18:27   DG Chest Port 1 View  Result Date: 11/12/2021 CLINICAL DATA:  Questionable sepsis.  Right-sided weakness. EXAM: PORTABLE CHEST 1 VIEW COMPARISON:  04/17/2020.  Chest CT 06/16/2021 FINDINGS: Mild cardiomegaly. Aortic atherosclerosis. No evidence of consolidation, collapse or effusion. Mild hilar prominence right more than left felt most likely secondary to the portable AP technique and poor inspiration. Additional chest radiographic follow-up suggested when the patient is able to have a standard two view exam to exclude right hilar prominence. IMPRESSION: Cardiomegaly.  Aortic atherosclerosis. Technical limitations of poor inspiration and portable AP technique. Recommend follow-up two-view chest radiography when able to exclude right hilar mass. Electronically Signed   By: Nelson Chimes M.D.   On: 11/12/2021 16:08   EEG adult  Result Date: 11/13/2021 Lora Havens, MD     11/13/2021  1:18 PM  Patient Name: Kristen Murphy MRN: [id number] Epilepsy Attending: Lora Havens Referring Physician/Provider: Kerney Elbe, MD Date: 11/13/2021 Duration: 22.22 mins Patient history: 56 year old woman with a history of cervical cancer, status post nephrectomy 2 months ago, colostomy, CAD, diabetes, depression, hypertension, remote history of stroke without residual deficits who was brought in by EMS for altered mental status.  EEG to evaluate for seizure. Level of alertness: obtunded AEDs during EEG study: versed, propofol, LEV Technical aspects: This EEG study was done with scalp electrodes positioned according to the 10-20 International system of electrode placement. Electrical activity was acquired at a sampling rate of _0  and reviewed with a high frequency filter of _1  and a low frequency filter of _2 . EEG data were recorded continuously and  digitally stored. Description: EEG showed continuous generalized background attenuation.  Hyperventilation and photic stimulation were not performed.   Of note, study was technically difficult due to significant myogenic artifact ABNORMALITY -Background attenuation, generalized IMPRESSION: This technically difficult study is suggestive of profound degree of encephalopathy, nonspecific etiology.  No seizures or epileptiform discharges were seen throughout the recording. Lora Havens   ECHOCARDIOGRAM COMPLETE  Result Date: 11/13/2021    ECHOCARDIOGRAM REPORT   Patient Name:   Kristen Murphy Date of Exam: 11/13/2021 Medical Rec #:  [id number]       Height:       62.0 in Accession #:    [id number]      Weight:       166.7 lb Date of Birth:  [birth year]       BSA:          1.769 m Patient Age:    65 years        BP:           103/60 mmHg Patient Gender: F               HR:           88 bpm. Exam Location:  Inpatient Procedure: 2D Echo, Cardiac Doppler and Color Doppler Indications:    Cardiac arrest  History:        Patient has prior history of Echocardiogram  examinations, most                 recent 02/19/2019. CAD, Stroke, Signs/Symptoms:Murmur and                 Dyspnea; Risk Factors:Hypertension and Diabetes. 02/19/2019 cath.  Sonographer:    Luisa Hart RDCS Referring Phys: [place]  1. Left ventricular ejection fraction, by estimation, is 65 to 70%. The left ventricle has normal function. The left ventricle has no regional wall motion abnormalities. Left ventricular diastolic parameters are consistent with Grade I diastolic dysfunction (impaired relaxation).  2. Right ventricular systolic function is normal. The right ventricular size is normal.  3. The mitral valve is normal in structure. No evidence of mitral valve regurgitation. Mild to moderate mitral stenosis.  4. The aortic valve is normal in structure. Aortic valve regurgitation is not visualized. Mild aortic valve stenosis. FINDINGS  Left Ventricle: Left ventricular ejection fraction, by estimation, is 65 to 70%. The left ventricle has normal function. The left ventricle has no regional wall motion abnormalities. The left ventricular internal cavity size was small. There is no left ventricular hypertrophy. Left ventricular diastolic parameters are consistent with Grade I diastolic dysfunction (impaired relaxation). Right Ventricle: The right ventricular size is normal. Right vetricular wall thickness was not well visualized. Right ventricular systolic function is normal. Left Atrium: Left atrial size was normal in size. Right Atrium: Right atrial size was normal in size. Pericardium: There is no evidence of pericardial effusion. Mitral Valve: The mitral valve is normal in structure. No evidence of mitral valve regurgitation. Mild to moderate mitral valve stenosis. MV peak gradient, 10.8 mmHg. The mean mitral valve gradient is 6.0 mmHg. Tricuspid Valve: The tricuspid valve is normal in structure. Tricuspid valve regurgitation is mild. Aortic Valve: The aortic valve is normal in  structure. Aortic valve regurgitation is not visualized. Mild aortic stenosis is present. Aortic valve mean gradient measures 10.0 mmHg. Aortic valve peak gradient measures 17.8 mmHg. Aortic valve area, by VTI measures 1.62 cm. Pulmonic Valve: The pulmonic valve was normal in  structure. Pulmonic valve regurgitation is not visualized. Aorta: The aortic root and ascending aorta are structurally normal, with no evidence of dilitation. IAS/Shunts: The atrial septum is grossly normal.  LEFT VENTRICLE PLAX 2D LVIDd:         3.70 cm     Diastology LVIDs:         2.40 cm     LV e' medial:    4.46 cm/s LV PW:         1.20 cm     LV E/e' medial:  19.3 LV IVS:        1.00 cm     LV e' lateral:   6.64 cm/s LVOT diam:     2.10 cm     LV E/e' lateral: 13.0 LV SV:         56 LV SV Index:   32 LVOT Area:     3.46 cm  LV Volumes (MOD) LV vol d, MOD A4C: 50.1 ml LV vol s, MOD A4C: 14.3 ml LV SV MOD A4C:     50.1 ml RIGHT VENTRICLE RV S prime:     19.60 cm/s TAPSE (M-mode): 2.4 cm LEFT ATRIUM             Index LA diam:        4.00 cm 2.26 cm/m LA Vol (A2C):   33.2 ml 18.77 ml/m LA Vol (A4C):   50.8 ml 28.71 ml/m LA Biplane Vol: 42.2 ml 23.85 ml/m  AORTIC VALVE                     PULMONIC VALVE AV Area (Vmax):    1.74 cm      PV Vmax:       1.36 m/s AV Area (Vmean):   1.63 cm      PV Vmean:      87.750 cm/s AV Area (VTI):     1.62 cm      PV VTI:        0.215 m AV Vmax:           211.00 cm/s   PV Peak grad:  7.3 mmHg AV Vmean:          144.000 cm/s  PV Mean grad:  4.0 mmHg AV VTI:            0.344 m AV Peak Grad:      17.8 mmHg AV Mean Grad:      10.0 mmHg LVOT Vmax:         106.00 cm/s LVOT Vmean:        67.700 cm/s LVOT VTI:          0.161 m LVOT/AV VTI ratio: 0.47  AORTA Ao Asc diam: 3.10 cm MITRAL VALVE                TRICUSPID VALVE MV Area (PHT): 2.95 cm     TR Peak grad:   12.8 mmHg MV Area VTI:   1.13 cm     TR Vmax:        179.00 cm/s MV Peak grad:  10.8 mmHg MV Mean grad:  6.0 mmHg     SHUNTS MV Vmax:       1.64  m/s     Systemic VTI:  0.16 m MV Vmean:      112.0 cm/s   Systemic Diam: 2.10 cm MV Decel Time: 257 msec MV E velocity: 86.10 cm/s MV A velocity: 117.00 cm/s MV E/A ratio:  0.74  Mertie Moores MD Electronically signed by Mertie Moores MD Signature Date/Time: 11/13/2021/12:23:16 PM    Final    CT HEAD CODE STROKE WO CONTRAST  Result Date: 11/12/2021 CLINICAL DATA:  Code stroke. Neuro deficit, acute, stroke suspected. EXAM: CT HEAD WITHOUT CONTRAST TECHNIQUE: Contiguous axial images were obtained from the base of the skull through the vertex without intravenous contrast. RADIATION DOSE REDUCTION: This exam was performed according to the departmental dose-optimization program which includes automated exposure control, adjustment of the mA and/or kV according to patient size and/or use of iterative reconstruction technique. COMPARISON:  04/17/2020 FINDINGS: Brain: The study suffers from significant motion degradation. No focal abnormality is seen affecting the brainstem or cerebellum. Cerebral hemispheres show an old lacunar infarction in the left basal ganglia an old left frontal cortical and subcortical infarction. No sign of acute infarction, mass lesion, hemorrhage, hydrocephalus or extra-axial collection. Vascular: There is atherosclerotic calcification of the major vessels at the base of the brain. Skull: Negative, allowing for the motion degradation. Sinuses/Orbits: Clear/normal Other: None ASPECTS ([place] Stroke Program Early CT Score) - Ganglionic level infarction (caudate, lentiform nuclei, internal capsule, insula, M1-M3 cortex): 7 - Supraganglionic infarction (M4-M6 cortex): 3 Total score (0-10 with 10 being normal): 10 IMPRESSION: 1. Motion degraded study. No acute CT finding. Old left basal ganglia lacunar infarction. Old left frontal cortical and subcortical infarction. 2. ASPECTS is 10, allowing for motion. 3. These results were called by telephone at the time of interpretation on 11/12/2021 at 3:22 pm  to provider MATTHEW TRIFAN , who verbally acknowledged these results. Electronically Signed   By: Nelson Chimes M.D.   On: 11/12/2021 15:27    Medications: Scheduled:  chlorhexidine gluconate (MEDLINE KIT)  15 mL Mouth Rinse BID   Chlorhexidine Gluconate Cloth  6 each Topical Daily   fentaNYL (SUBLIMAZE) injection  50 mcg Intravenous Once   insulin aspart  0-15 Units Subcutaneous Q4H   mouth rinse  15 mL Mouth Rinse 10 times per day   [START ON 11/16/2021] pantoprazole  40 mg Intravenous Q12H   sodium chloride flush  3 mL Intravenous Once   Continuous:  sodium chloride     sodium chloride     acyclovir Stopped (11/14/21 0435)   cefTRIAXone (ROCEPHIN)  IV 2 g (11/14/21 0532)   fentaNYL infusion INTRAVENOUS 100 mcg/hr (11/14/21 0500)   levETIRAcetam Stopped (11/13/21 2142)   midazolam Stopped (11/14/21 0459)   norepinephrine (LEVOPHED) Adult infusion 6 mcg/min (11/14/21 0500)   potassium chloride 10 mEq (11/14/21 0606)   propofol (DIPRIVAN) infusion 25 mcg/kg/min (11/14/21 0500)   vancomycin Stopped (11/13/21 2302)   Assessment: 56 year old woman with a history of cervical cancer, status post nephrectomy 2 months ago, colostomy, CAD, diabetes, depression, hypertension, remote history of stroke without residual deficits who was brought in by EMS for altered mental status.  CT head with no acute abnormality and her exam was non-lateralizing so suspicion for stroke is low.  - DDx: Etiology of encephalopathy favored to be sepsis. CT head was reassuring. Has had a flulike illness for a week and was found confused on 2/19. Some suspicion by son that she may have been taking more prescription narcotics; however, UDS was only positive for benzo. Also taking many NSAIDs per his report. - In addition to the above, had acute ardiopulmonary arrest, PEA arrest yesterday with CPR time of 2 minutes. Diagnosed with acute respiratory failure with hypoxemia and was intubated 2/19.  - Exam today under less  sedation than yesterday (now on Fentanyl  at a rate of 100) continues to be with minimal responses. No clinical seizure activity noted.  - Acute GI bleed, most recent hemoglobin 10.1 > 11.2.  History of NSAID abuse. See bronchoscopy results above.  - CCM favors empiric treatment with ceftriaxone, vancomycin, ampicillin, acyclovir for possible meningitis. Neurology team is in agreement. - CT head was motion degraded, with no acute CT finding. Noted were an old left basal ganglia lacunar infarction and an old left frontal cortical and subcortical infarction. Seen on CTA chest is a chronic occlusion of the left common carotid artery. - EEG: Background attenuation, generalized. This technically difficult study is suggestive of profound degree of encephalopathy, nonspecific etiology.  No seizures or epileptiform discharges were seen throughout the recording.   Recommendations: - Continue empiric meningitis-dose ABX. Meningitis is lower on the DDx than other etiologies for her fever and AMS. May need LP to completely rule out a meningitis. Will defer to ICU team regarding timing of LP. - No further stroke workup indicated at this time - Follow up Neurology exam when weaned off sedation.  - MRI brain with and without contrast - Discussed with CCM  35 minutes spent in the neurological evaluation and management of this critically ill patient.   Addendum:  - MRI completed. Preliminary review of the images reveals multiple small, round DWI+ lesions scattered throughout the brain parenchyma, infratentorially and supratentorially, some with ring-like morphologies. On FLAIR images, several of the lesions exhibit adjacent vasogenic edema. Some of the lesions exhibit corresponding T1-hypointensity. A small subset of the lesions exhibit enhancement on the post-contrast images. 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 DDx for underlying pathogen includes bacterial (including  tuberculosis), fungal (histoplasmosis, aspergillosis), protozoan (toxoplasmosis), parasitic (neurocysticercosis). Lower on the DDx would be an atypical presentation of CNS inflammatory disease such as MS or CNS lupus.  - Will need ID consult. Need to discuss the possibility of her lung infectious process embolizing to brain, versus an occult SBE not seen on initial TTE.  - Neurosurgery consult for possible lesion biopsy - Recommend LP. The lesions do not exert enough mass effect to carry a significant risk of herniation that would outweigh the benefits of the information gained from CSF sampling to inform choice of antibiotic or antifungal regimen. Would obtain cell count with differential, protein, glucose, VDRL, AFB stain, gram stain, fungal stain, cryptococcal antigen, fungal and bacterial culture. - Above discussed with CCM  Addendum: - Official MRI report: Numerous lesions in the brain. There are lesions in the cerebellar hemispheres and in both cerebral hemispheres widely distributed. Greater than 50 lesions are identified. The lesions have a rounded appearance with restricted diffusion and mild enhancement. Many lesions show evidence of mild associated hemorrhage. Mild edema. Restricted diffusion in the left lateral ventricle likely due to ventriculitis. 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 Differential diagnosis includes metastatic disease and demyelinating disease. Correlate with lumbar puncture results.   LOS: 2 days   _0  signed: Dr. Kerney Elbe 11/14/2021  7:08 AM

## 2021-11-14 NOTE — Progress Notes (Signed)
°  Transition of Care [facility]) Screening Note   Patient Details  Name: Kristen Murphy Date of Birth: 01/30/1966   [facility]) CM/SW Contact:    Milas Gain, [place] Phone Number: 11/14/2021, 3:20 PM    Transition of Care Department [facility]) has reviewed patient and no TOC needs have been identified at this time. We will continue to monitor patient advancement through interdisciplinary progression rounds. If new patient transition needs arise, please place a TOC consult.

## 2021-11-14 NOTE — Progress Notes (Signed)
Progress Note    ASSESSMENT AND PLAN:   No further bleeding.  Continued on IV Protonix Hb stable No GI intervention planned.  Would sign off for now. Please call if we could be of any assistance.     SUBJECTIVE   Still intubated. Getting core track tube tomorrow for feeding. No bleeding from the ostomy.    OBJECTIVE:     Vital signs in last 24 hours: Temp:  [97.5 F (36.4 C)-99.6 F (37.6 C)] 98.1 F (36.7 C) (02/21 1100) Pulse Rate:  [88-118] 109 (02/21 1000) Resp:  [14-33] 27 (02/21 1000) BP: (81-155)/(31-74) 116/54 (02/21 1000) SpO2:  [94 %-100 %] 98 % (02/21 1000) Arterial Line BP: (125-163)/(43-61) 130/50 (02/21 1000) FiO2 (%):  [80 %-100 %] 80 % (02/21 0741) Weight:  [82.6 kg] 82.6 kg (02/21 0500) Last BM Date : 11/14/21 (ostomy) Patient intubated. No blood in the ostomy   Intake/Output from previous day: 02/20 0701 - 02/21 0700 In: 2780.9 [I.V.:1647.1; IV Piggyback:1133.8] Out: 1105 [Urine:1105] Intake/Output this shift: Total I/O In: 297.7 [I.V.:56.4; IV Piggyback:241.3] Out: 155 [Urine:155]  Lab Results: Recent Labs    11/12/21 1508 11/12/21 1514 11/13/21 0228 11/13/21 0353 11/13/21 2110 11/14/21 0315 11/14/21 0826  WBC 18.6*  --  30.2*  --   --  26.7*  --   HGB 8.9*   < > 10.7*   < > 11.2* 10.1* 11.2*  HCT 28.9*   < > 35.1*   < > 33.0* 32.8* 33.0*  PLT 251  --  214  --   --  181  --    < > = values in this interval not displayed.   BMET Recent Labs    11/12/21 1508 11/12/21 1514 11/12/21 1759 11/13/21 0228 11/13/21 0353 11/13/21 2110 11/14/21 0315 11/14/21 0826  NA 126* 129*   < > 135   < > 131* 131* 131*  K 3.0* 3.1*   < > 3.8   < > 3.4* 3.3* 3.9  CL 93* 97*  --  99  --   --  96*  --   CO2 20*  --   --  26  --   --  25  --   GLUCOSE 417* 412*  --  125*  --   --  213*  --   BUN 16 15  --  14  --   --  12  --   CREATININE 1.26* 1.10*  --  1.10*  --   --  1.06*  --   CALCIUM 7.9*  --   --  7.8*  --   --  7.6*  --     < > = values in this interval not displayed.   LFT Recent Labs    11/13/21 0228  PROT 6.4*  ALBUMIN 2.3*  AST 84*  ALT 51*  ALKPHOS 65  BILITOT 0.2*   PT/INR Recent Labs    11/12/21 1508  LABPROT 15.0  INR 1.2   Hepatitis Panel No results for input(s): HEPBSAG, HCVAB, HEPAIGM, HEPBIGM in the last 72 hours.  CT HEAD WO CONTRAST (5MM)  Result Date: 11/12/2021 CLINICAL DATA:  Altered mental status following cardiac arrest, initial encounter EXAM: CT HEAD WITHOUT CONTRAST TECHNIQUE: Contiguous axial images were obtained from the base of the skull through the vertex without intravenous contrast. RADIATION DOSE REDUCTION: This exam was performed according to the departmental dose-optimization program which includes automated exposure control, adjustment of the mA and/or kV according to patient size and/or  use of iterative reconstruction technique. COMPARISON:  CT from earlier in the same day. FINDINGS: Brain: No findings to suggest acute hemorrhage, acute infarction or space-occupying mass lesion are noted. 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 No new focal abnormality is seen. Vascular: No hyperdense vessel or unexpected calcification. Skull: Normal. Negative for fracture or focal lesion. Sinuses/Orbits: No acute finding. Other: None. IMPRESSION: No acute abnormality noted. No significant change from the prior exam. Electronically Signed   By: Inez Catalina M.D.   On: 11/12/2021 19:14   CT Angio Chest PE W and/or Wo Contrast  Result Date: 11/12/2021 CLINICAL DATA:  Recent stroke-like symptoms, possible sepsis EXAM: CT ANGIOGRAPHY CHEST CT ABDOMEN AND PELVIS WITH CONTRAST TECHNIQUE: Multidetector CT imaging of the chest was performed using the standard protocol during bolus administration of intravenous contrast. Multiplanar CT image reconstructions and MIPs were obtained to evaluate the vascular anatomy. Multidetector CT imaging of the abdomen and pelvis was performed using the standard  protocol during bolus administration of intravenous contrast. RADIATION DOSE REDUCTION: This exam was performed according to the departmental dose-optimization program which includes automated exposure control, adjustment of the mA and/or kV according to patient size and/or use of iterative reconstruction technique. CONTRAST:  123mL OMNIPAQUE IOHEXOL 350 MG/ML SOLN COMPARISON:  Chest x-ray from earlier in the same day, CT from 09/08/2021 FINDINGS: CTA CHEST FINDINGS Cardiovascular: Atherosclerotic calcifications are noted. No aneurysmal dilatation or dissection of the thoracic aorta is seen. No cardiac enlargement is noted. Scattered coronary calcifications are noted. The pulmonary artery shows a normal branching pattern. No filling defect to suggest pulmonary embolism is seen. 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 Mediastinum/Nodes: Thoracic inlet is within normal limits. Endotracheal tube is noted in satisfactory position. Gastric catheter extends into the stomach. The esophagus is within normal limits. No sizable hilar or mediastinal adenopathy is noted Lungs/Pleura: Lungs are well aerated bilaterally. Diffuse consolidation is noted in the medial aspect of the right lower lobe consistent with acute infiltrate. Some associated cavitary changes are noted. No sizable effusion is noted. No other focal infiltrate is noted. Mild left basilar atelectasis is seen. Well-formed bleb is noted in the medial aspect of the left lung base which was not present on the prior exam. Air-fluid level is noted within. Previously noted effusions bilaterally have resolved in the interval. Musculoskeletal: Degenerative changes of the thoracic spine are noted. No acute rib abnormality is seen. Review of the MIP images confirms the above findings. CT ABDOMEN and PELVIS FINDINGS Hepatobiliary: Gallbladder is decompressed. Liver demonstrates a normal enhancement pattern with the exception of a geographic area  of decreased attenuation in the lateral segment of the left lobe of the liver. This has a somewhat masslike appearance with hepatic contour change best seen on image number 26 of series 5. This was not present on the prior examination. Given the somewhat masslike density the possibility of a neoplastic disease could not be totally excluded given the patient's clinical history. Pancreas: Unremarkable. No pancreatic ductal dilatation or surrounding inflammatory changes. Spleen: Normal in size without focal abnormality. Adrenals/Urinary Tract: Adrenal glands are within normal limits. The left kidney demonstrates a normal enhancement pattern. No renal calculi or obstructive changes are noted. Right kidney has been surgically removed. Ureter is within normal limits. Bladder is incompletely distended. Stomach/Bowel: Hartmann's pouch is noted in the mid descending colon. Left upper quadrant colostomy is seen assistant with the patient's given clinical history. No obstructive or inflammatory changes are noted. The appendix is  within normal limits. No small bowel or gastric abnormality is noted. Vascular/Lymphatic: Aortic atherosclerosis. No enlarged abdominal or pelvic lymph nodes. Reproductive: Uterus and bilateral adnexa are unremarkable. Other: No abdominal wall hernia or abnormality. No abdominopelvic ascites. Musculoskeletal: No acute or significant osseous findings. Review of the MIP images confirms the above findings. IMPRESSION: CTA of the chest: No evidence of pulmonary emboli. Large mildly cavitary infiltrate in superior segment of right lower lobe consistent with acute pneumonia. Some minimal infiltrate is noted in the left base as well. Small bleb with air-fluid level within is noted in the medial aspect of the left base. These findings are all new from the prior exam. Resolution of previously seen effusions. 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 CT of the abdomen and pelvis: Postsurgical changes  consistent with colostomy. Area of decreased attenuation and enhancement within the lateral segment of the left lobe of the liver with a somewhat mass like appearance along the margin of the liver. Possible metastatic disease would deserve consideration. Status post right nephrectomy. Electronically Signed   By: Inez Catalina M.D.   On: 11/12/2021 19:33   CT ABDOMEN PELVIS W CONTRAST  Result Date: 11/12/2021 CLINICAL DATA:  Recent stroke-like symptoms, possible sepsis EXAM: CT ANGIOGRAPHY CHEST CT ABDOMEN AND PELVIS WITH CONTRAST TECHNIQUE: Multidetector CT imaging of the chest was performed using the standard protocol during bolus administration of intravenous contrast. Multiplanar CT image reconstructions and MIPs were obtained to evaluate the vascular anatomy. Multidetector CT imaging of the abdomen and pelvis was performed using the standard protocol during bolus administration of intravenous contrast. RADIATION DOSE REDUCTION: This exam was performed according to the departmental dose-optimization program which includes automated exposure control, adjustment of the mA and/or kV according to patient size and/or use of iterative reconstruction technique. CONTRAST:  114mL OMNIPAQUE IOHEXOL 350 MG/ML SOLN COMPARISON:  Chest x-ray from earlier in the same day, CT from 09/08/2021 FINDINGS: CTA CHEST FINDINGS Cardiovascular: Atherosclerotic calcifications are noted. No aneurysmal dilatation or dissection of the thoracic aorta is seen. No cardiac enlargement is noted. Scattered coronary calcifications are noted. The pulmonary artery shows a normal branching pattern. No filling defect to suggest pulmonary embolism is seen. 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 Mediastinum/Nodes: Thoracic inlet is within normal limits. Endotracheal tube is noted in satisfactory position. Gastric catheter extends into the stomach. The esophagus is within normal limits. No sizable hilar or mediastinal  adenopathy is noted Lungs/Pleura: Lungs are well aerated bilaterally. Diffuse consolidation is noted in the medial aspect of the right lower lobe consistent with acute infiltrate. Some associated cavitary changes are noted. No sizable effusion is noted. No other focal infiltrate is noted. Mild left basilar atelectasis is seen. Well-formed bleb is noted in the medial aspect of the left lung base which was not present on the prior exam. Air-fluid level is noted within. Previously noted effusions bilaterally have resolved in the interval. Musculoskeletal: Degenerative changes of the thoracic spine are noted. No acute rib abnormality is seen. Review of the MIP images confirms the above findings. CT ABDOMEN and PELVIS FINDINGS Hepatobiliary: Gallbladder is decompressed. Liver demonstrates a normal enhancement pattern with the exception of a geographic area of decreased attenuation in the lateral segment of the left lobe of the liver. This has a somewhat masslike appearance with hepatic contour change best seen on image number 26 of series 5. This was not present on the prior examination. Given the somewhat masslike density the possibility of a neoplastic  disease could not be totally excluded given the patient's clinical history. Pancreas: Unremarkable. No pancreatic ductal dilatation or surrounding inflammatory changes. Spleen: Normal in size without focal abnormality. Adrenals/Urinary Tract: Adrenal glands are within normal limits. The left kidney demonstrates a normal enhancement pattern. No renal calculi or obstructive changes are noted. Right kidney has been surgically removed. Ureter is within normal limits. Bladder is incompletely distended. Stomach/Bowel: Hartmann's pouch is noted in the mid descending colon. Left upper quadrant colostomy is seen assistant with the patient's given clinical history. No obstructive or inflammatory changes are noted. The appendix is within normal limits. No small bowel or gastric  abnormality is noted. Vascular/Lymphatic: Aortic atherosclerosis. No enlarged abdominal or pelvic lymph nodes. Reproductive: Uterus and bilateral adnexa are unremarkable. Other: No abdominal wall hernia or abnormality. No abdominopelvic ascites. Musculoskeletal: No acute or significant osseous findings. Review of the MIP images confirms the above findings. IMPRESSION: CTA of the chest: No evidence of pulmonary emboli. Large mildly cavitary infiltrate in superior segment of right lower lobe consistent with acute pneumonia. Some minimal infiltrate is noted in the left base as well. Small bleb with air-fluid level within is noted in the medial aspect of the left base. These findings are all new from the prior exam. Resolution of previously seen effusions. 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 CT of the abdomen and pelvis: Postsurgical changes consistent with colostomy. Area of decreased attenuation and enhancement within the lateral segment of the left lobe of the liver with a somewhat mass like appearance along the margin of the liver. Possible metastatic disease would deserve consideration. Status post right nephrectomy. Electronically Signed   By: Inez Catalina M.D.   On: 11/12/2021 19:33   Portable Chest xray  Result Date: 11/13/2021 CLINICAL DATA:  A 56 year old female presents for evaluation of respiratory failure. EXAM: PORTABLE CHEST 1 VIEW COMPARISON:  Comparison is made with November 12, 2021. FINDINGS: Pacer defibrillator pad projects over the central chest. EKG leads project over the chest and upper abdomen. LEFT-sided central venous catheter terminates at the caval to atrial junction. Endotracheal tube approximately 3.9 cm from the carina. Cardiomediastinal contours and hilar structures are stable. Added density about the RIGHT hilum compatible with known RIGHT lower lobe perihilar masslike area seen on recent imaging without gross change. No additional areas of consolidation or masslike  appearance. No pneumothorax. On limited assessment there is no acute skeletal process. IMPRESSION: 1. Endotracheal tube approximately 3.9 cm from the carina. 2. No substantial change since recent imaging. 3. RIGHT perihilar masslike area may represent necrotic pneumonia, area without abnormality in September of 2022. Possibility of neoplasm is considered in this patient with history of cervical cancer. Short interval follow-up after therapy is suggested. Pulmonary consultation may be helpful if not yet obtained. Electronically Signed   By: Zetta Bills M.D.   On: 11/13/2021 08:04   DG CHEST PORT 1 VIEW  Result Date: 11/12/2021 CLINICAL DATA:  Central line placement. EXAM: PORTABLE CHEST 1 VIEW COMPARISON:  Radiograph earlier today. FINDINGS: New left subclavian central venous catheter tip overlies the lower SVC. No pneumothorax. Endotracheal tube and enteric tube remain in place. Stable heart size. Right perihilar masslike opacity characterized on CT earlier today. There is mild background bronchial thickening. No significant pleural effusion. IMPRESSION: 1. New left subclavian central venous catheter with tip overlying the lower SVC. No pneumothorax. 2. Right perihilar masslike opacity, characterized on CT earlier today. Electronically Signed   By: Keith Rake M.D.   On: 11/12/2021  21:02   DG Chest Portable 1 View  Result Date: 11/12/2021 CLINICAL DATA:  ng/ett placement EXAM: PORTABLE CHEST 1 VIEW COMPARISON:  Same day chest radiograph. FINDINGS: Masslike prominence of the right hilum persists. Cardiac silhouette is mildly enlarged. No consolidation. No visible pleural effusions or pneumothorax on this semi upright AP radiograph. IMPRESSION: 1. Masslike prominence of the right hilum persists. Recommend CT chest (preferably with contrast) when able to exclude adenopathy and/or mass. 2. Endotracheal tube tip approximately 2.3 cm above the carina. 3. Gastric tube courses below the diaphragm with the  tip outside the field of view. Electronically Signed   By: Margaretha Sheffield M.D.   On: 11/12/2021 18:27   DG Chest Port 1 View  Result Date: 11/12/2021 CLINICAL DATA:  Questionable sepsis.  Right-sided weakness. EXAM: PORTABLE CHEST 1 VIEW COMPARISON:  04/17/2020.  Chest CT 06/16/2021 FINDINGS: Mild cardiomegaly. Aortic atherosclerosis. No evidence of consolidation, collapse or effusion. Mild hilar prominence right more than left felt most likely secondary to the portable AP technique and poor inspiration. Additional chest radiographic follow-up suggested when the patient is able to have a standard two view exam to exclude right hilar prominence. IMPRESSION: Cardiomegaly.  Aortic atherosclerosis. Technical limitations of poor inspiration and portable AP technique. Recommend follow-up two-view chest radiography when able to exclude right hilar mass. Electronically Signed   By: Nelson Chimes M.D.   On: 11/12/2021 16:08   EEG adult  Result Date: 11/13/2021 Lora Havens, MD     11/13/2021  1:18 PM Patient Name: JANAA ACERO MRN: [id number] Epilepsy Attending: Lora Havens Referring Physician/Provider: Kerney Elbe, MD Date: 11/13/2021 Duration: 22.22 mins Patient history: 56 year old woman with a history of cervical cancer, status post nephrectomy 2 months ago, colostomy, CAD, diabetes, depression, hypertension, remote history of stroke without residual deficits who was brought in by EMS for altered mental status.  EEG to evaluate for seizure. Level of alertness: obtunded AEDs during EEG study: versed, propofol, LEV Technical aspects: This EEG study was done with scalp electrodes positioned according to the 10-20 International system of electrode placement. Electrical activity was acquired at a sampling rate of 500Hz  and reviewed with a high frequency filter of 70Hz  and a low frequency filter of 1Hz . EEG data were recorded continuously and digitally stored. Description: EEG showed continuous  generalized background attenuation.  Hyperventilation and photic stimulation were not performed.   Of note, study was technically difficult due to significant myogenic artifact ABNORMALITY -Background attenuation, generalized IMPRESSION: This technically difficult study is suggestive of profound degree of encephalopathy, nonspecific etiology.  No seizures or epileptiform discharges were seen throughout the recording. Lora Havens   ECHOCARDIOGRAM COMPLETE  Result Date: 11/13/2021    ECHOCARDIOGRAM REPORT   Patient Name:   SHAHAD MAZUREK Date of Exam: 11/13/2021 Medical Rec #:  [id number]       Height:       62.0 in Accession #:    [id number]      Weight:       166.7 lb Date of Birth:  1965/11/08       BSA:          1.769 m Patient Age:    56 years        BP:           103/60 mmHg Patient Gender: F               HR:           88 bpm. Exam  Location:  Inpatient Procedure: 2D Echo, Cardiac Doppler and Color Doppler Indications:    Cardiac arrest  History:        Patient has prior history of Echocardiogram examinations, most                 recent 02/19/2019. CAD, Stroke, Signs/Symptoms:Murmur and                 Dyspnea; Risk Factors:Hypertension and Diabetes. 02/19/2019 cath.  Sonographer:    Luisa Hart RDCS Referring Phys: [place]  1. Left ventricular ejection fraction, by estimation, is 65 to 70%. The left ventricle has normal function. The left ventricle has no regional wall motion abnormalities. Left ventricular diastolic parameters are consistent with Grade I diastolic dysfunction (impaired relaxation).  2. Right ventricular systolic function is normal. The right ventricular size is normal.  3. The mitral valve is normal in structure. No evidence of mitral valve regurgitation. Mild to moderate mitral stenosis.  4. The aortic valve is normal in structure. Aortic valve regurgitation is not visualized. Mild aortic valve stenosis. FINDINGS  Left Ventricle: Left ventricular ejection  fraction, by estimation, is 65 to 70%. The left ventricle has normal function. The left ventricle has no regional wall motion abnormalities. The left ventricular internal cavity size was small. There is no left ventricular hypertrophy. Left ventricular diastolic parameters are consistent with Grade I diastolic dysfunction (impaired relaxation). Right Ventricle: The right ventricular size is normal. Right vetricular wall thickness was not well visualized. Right ventricular systolic function is normal. Left Atrium: Left atrial size was normal in size. Right Atrium: Right atrial size was normal in size. Pericardium: There is no evidence of pericardial effusion. Mitral Valve: The mitral valve is normal in structure. No evidence of mitral valve regurgitation. Mild to moderate mitral valve stenosis. MV peak gradient, 10.8 mmHg. The mean mitral valve gradient is 6.0 mmHg. Tricuspid Valve: The tricuspid valve is normal in structure. Tricuspid valve regurgitation is mild. Aortic Valve: The aortic valve is normal in structure. Aortic valve regurgitation is not visualized. Mild aortic stenosis is present. Aortic valve mean gradient measures 10.0 mmHg. Aortic valve peak gradient measures 17.8 mmHg. Aortic valve area, by VTI measures 1.62 cm. Pulmonic Valve: The pulmonic valve was normal in structure. Pulmonic valve regurgitation is not visualized. Aorta: The aortic root and ascending aorta are structurally normal, with no evidence of dilitation. IAS/Shunts: The atrial septum is grossly normal.  LEFT VENTRICLE PLAX 2D LVIDd:         3.70 cm     Diastology LVIDs:         2.40 cm     LV e' medial:    4.46 cm/s LV PW:         1.20 cm     LV E/e' medial:  19.3 LV IVS:        1.00 cm     LV e' lateral:   6.64 cm/s LVOT diam:     2.10 cm     LV E/e' lateral: 13.0 LV SV:         56 LV SV Index:   32 LVOT Area:     3.46 cm  LV Volumes (MOD) LV vol d, MOD A4C: 50.1 ml LV vol s, MOD A4C: 14.3 ml LV SV MOD A4C:     50.1 ml RIGHT  VENTRICLE RV S prime:     19.60 cm/s TAPSE (M-mode): 2.4 cm LEFT ATRIUM  Index LA diam:        4.00 cm 2.26 cm/m LA Vol (A2C):   33.2 ml 18.77 ml/m LA Vol (A4C):   50.8 ml 28.71 ml/m LA Biplane Vol: 42.2 ml 23.85 ml/m  AORTIC VALVE                     PULMONIC VALVE AV Area (Vmax):    1.74 cm      PV Vmax:       1.36 m/s AV Area (Vmean):   1.63 cm      PV Vmean:      87.750 cm/s AV Area (VTI):     1.62 cm      PV VTI:        0.215 m AV Vmax:           211.00 cm/s   PV Peak grad:  7.3 mmHg AV Vmean:          144.000 cm/s  PV Mean grad:  4.0 mmHg AV VTI:            0.344 m AV Peak Grad:      17.8 mmHg AV Mean Grad:      10.0 mmHg LVOT Vmax:         106.00 cm/s LVOT Vmean:        67.700 cm/s LVOT VTI:          0.161 m LVOT/AV VTI ratio: 0.47  AORTA Ao Asc diam: 3.10 cm MITRAL VALVE                TRICUSPID VALVE MV Area (PHT): 2.95 cm     TR Peak grad:   12.8 mmHg MV Area VTI:   1.13 cm     TR Vmax:        179.00 cm/s MV Peak grad:  10.8 mmHg MV Mean grad:  6.0 mmHg     SHUNTS MV Vmax:       1.64 m/s     Systemic VTI:  0.16 m MV Vmean:      112.0 cm/s   Systemic Diam: 2.10 cm MV Decel Time: 257 msec MV E velocity: 86.10 cm/s MV A velocity: 117.00 cm/s MV E/A ratio:  0.74 Mertie Moores MD Electronically signed by Mertie Moores MD Signature Date/Time: 11/13/2021/12:23:16 PM    Final    CT HEAD CODE STROKE WO CONTRAST  Result Date: 11/12/2021 CLINICAL DATA:  Code stroke. Neuro deficit, acute, stroke suspected. EXAM: CT HEAD WITHOUT CONTRAST TECHNIQUE: Contiguous axial images were obtained from the base of the skull through the vertex without intravenous contrast. RADIATION DOSE REDUCTION: This exam was performed according to the departmental dose-optimization program which includes automated exposure control, adjustment of the mA and/or kV according to patient size and/or use of iterative reconstruction technique. COMPARISON:  04/17/2020 FINDINGS: Brain: The study suffers from significant motion  degradation. No focal abnormality is seen affecting the brainstem or cerebellum. Cerebral hemispheres show an old lacunar infarction in the left basal ganglia an old left frontal cortical and subcortical infarction. No sign of acute infarction, mass lesion, hemorrhage, hydrocephalus or extra-axial collection. Vascular: There is atherosclerotic calcification of the major vessels at the base of the brain. Skull: Negative, allowing for the motion degradation. Sinuses/Orbits: Clear/normal Other: None ASPECTS ([place] Stroke Program Early CT Score) - Ganglionic level infarction (caudate, lentiform nuclei, internal capsule, insula, M1-M3 cortex): 7 - Supraganglionic infarction (M4-M6 cortex): 3 Total score (0-10 with 10 being normal): 10 IMPRESSION: 1. Motion  degraded study. No acute CT finding. Old left basal ganglia lacunar infarction. Old left frontal cortical and subcortical infarction. 2. ASPECTS is 10, allowing for motion. 3. These results were called by telephone at the time of interpretation on 11/12/2021 at 3:22 pm to provider MATTHEW TRIFAN , who verbally acknowledged these results. Electronically Signed   By: Nelson Chimes M.D.   On: 11/12/2021 15:27     Principal Problem:   Cardiac arrest [facility]) Active Problems:   Sepsis ([place])     LOS: 2 days     Carmell Austria, MD 11/14/2021, 11:23 AM [facility] [phone]

## 2021-11-14 NOTE — Consult Note (Signed)
[place] Nurse ostomy follow up Patient receiving care in [place]. Patient is intubated and sedated. Stoma type/location: Colostomy, LUQ. Pouching system is under the cooling apparatus and when I lift the cooling device it is stuck to the pouching system. Therefore, I was not able to clearly see through the pouch and view the stoma Stomal assessment/size: deferred, supplies must be obtained Peristomal assessment: deferred Treatment options for stomal/peristomal skin: deferred Output: brown  Ostomy pouching: 2pc. 2 1/4 inch system.  Keep the following ostomy supplies at the bedside: Roselee Culver #[id number]; skin barrier, Kellie Simmering #[phone]; barrier rings, Kellie Simmering [phone] [facility] nurse will not follow at this time.  Please re-consult the [place] team if needed.  Val Riles, RN, MSN, CWOCN, CNS-BC, pager [phone]

## 2021-11-14 NOTE — Progress Notes (Signed)
Patient transported to MRI and back.

## 2021-11-14 NOTE — Progress Notes (Signed)
Initial Nutrition Assessment  DOCUMENTATION CODES:   Not applicable  INTERVENTION:   Plan for Cortrak tomorrow  Tube Feeding via Cortrak:   Vital AF 1.2 at 65 ml/hr This provides 117 g of protein, 1872 kcals, 1264 mL of free water   NUTRITION DIAGNOSIS:   Inadequate oral intake related to acute illness as evidenced by NPO status.  GOAL:   Patient will meet greater than or equal to 90% of their needs   MONITOR:   Vent status, Labs, Weight trends, TF tolerance  REASON FOR ASSESSMENT:   Ventilator    ASSESSMENT:   56 yo female admitted with encephalopathy, acute PEA arrest, upper GI bleed with hemoptysis. 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 Head CT no acute findings.   Pt remains on vent support; remains encephalopathic-possible MRI and/or LP  Currently no enteral access. Per RN, GI prefers Cortrak to reinsertion of OG.   On assessment today, pt with large blood clot coming out of nose. RN notified  Unable to obtain diet and weight history at this time.   Current wt 82.6 kg; admit wt 75.6 kg  Labs: sodium 132 (L), CBGs 108-224, Creatinine wdl Meds: ss novolog  NUTRITION - FOCUSED PHYSICAL EXAM:  Flowsheet Row Most Recent Value  Orbital Region No depletion  Upper Arm Region Unable to assess  Thoracic and Lumbar Region Unable to assess  [TTM pads]  Buccal Region Unable to assess  [ET tube holder]  Temple Region No depletion  Clavicle Bone Region No depletion  Clavicle and Acromion Bone Region No depletion  Scapular Bone Region No depletion  Dorsal Hand Unable to assess  Patellar Region Unable to assess  Anterior Thigh Region Unable to assess  [TTM pads]  Posterior Calf Region Unable to assess       Diet  Order:   Diet Order             Diet NPO time specified  Diet effective now                   EDUCATION NEEDS:   Not appropriate for education at this time  Skin:  Skin Assessment: Reviewed RN Assessment  Last BM:  +stool via colostomy  Height:   Ht Readings from Last 1 Encounters:  11/12/21 5\' 2"  (1.575 m)    Weight:   Wt Readings from Last 1 Encounters:  11/14/21 82.6 kg     BMI:  Body mass index is 33.31 kg/m.  Estimated Nutritional Needs:   Kcal:  1800-2000 kcals  Protein:  115-130 g  Fluid:  >/= 1.8 L   Kerman Passey MS, RDN, LDN, CNSC Registered Dietitian III Clinical Nutrition RD Pager and On-Call Pager Number Located in [place]

## 2021-11-14 NOTE — Progress Notes (Signed)
2250- propofol restarted due to ventilator dyssynchrony, lowering SpO2  ( lower 90's), and sustained increase in HR (125-129)   0550- Propofol decreased for wake up assessment at 0500. Pt became dyssynchronous and SpO2 reading 87%. Propofol dosage increased and 100% FiO2 breaths given by vent. SpO2 is now 91%.  0618- SpO2 falling to mid 80's. Increased FiO2 to 100%- patient suctioned and repositioned. Fentanyl bolus given per order for RASS. Dyssynchronous to ventilator.

## 2021-11-15 ENCOUNTER — Inpatient Hospital Stay (HOSPITAL_COMMUNITY): Payer: Medicaid Other

## 2021-11-15 DIAGNOSIS — J9690 Respiratory failure, unspecified, unspecified whether with hypoxia or hypercapnia: Secondary | ICD-10-CM

## 2021-11-15 DIAGNOSIS — A419 Sepsis, unspecified organism: Secondary | ICD-10-CM | POA: Diagnosis not present

## 2021-11-15 DIAGNOSIS — E119 Type 2 diabetes mellitus without complications: Secondary | ICD-10-CM

## 2021-11-15 DIAGNOSIS — C539 Malignant neoplasm of cervix uteri, unspecified: Secondary | ICD-10-CM

## 2021-11-15 DIAGNOSIS — R6521 Severe sepsis with septic shock: Secondary | ICD-10-CM

## 2021-11-15 DIAGNOSIS — J15212 Pneumonia due to Methicillin resistant Staphylococcus aureus: Secondary | ICD-10-CM

## 2021-11-15 DIAGNOSIS — G060 Intracranial abscess and granuloma: Secondary | ICD-10-CM

## 2021-11-15 DIAGNOSIS — I469 Cardiac arrest, cause unspecified: Secondary | ICD-10-CM | POA: Diagnosis not present

## 2021-11-15 LAB — CBC
HCT: 32.6 % — ABNORMAL LOW (ref 36.0–46.0)
Hemoglobin: 10 g/dL — ABNORMAL LOW (ref 12.0–15.0)
MCH: 28 pg (ref 26.0–34.0)
MCHC: 30.7 g/dL (ref 30.0–36.0)
MCV: 91.3 fL (ref 80.0–100.0)
Platelets: 211 10*3/uL (ref 150–400)
RBC: 3.57 MIL/uL — ABNORMAL LOW (ref 3.87–5.11)
RDW: 16.1 % — ABNORMAL HIGH (ref 11.5–15.5)
WBC: 22.4 10*3/uL — ABNORMAL HIGH (ref 4.0–10.5)
nRBC: 0.3 % — ABNORMAL HIGH (ref 0.0–0.2)

## 2021-11-15 LAB — CULTURE, RESPIRATORY W GRAM STAIN

## 2021-11-15 LAB — CSF CELL COUNT WITH DIFFERENTIAL
Eosinophils, CSF: 0 % (ref 0–1)
Lymphs, CSF: 10 % — ABNORMAL LOW (ref 40–80)
Monocyte-Macrophage-Spinal Fluid: 6 % — ABNORMAL LOW (ref 15–45)
RBC Count, CSF: 14 /mm3 — ABNORMAL HIGH
Segmented Neutrophils-CSF: 84 % — ABNORMAL HIGH (ref 0–6)
Tube #: 3
WBC, CSF: 585 /mm3 (ref 0–5)

## 2021-11-15 LAB — GLUCOSE, CAPILLARY
Glucose-Capillary: 114 mg/dL — ABNORMAL HIGH (ref 70–99)
Glucose-Capillary: 123 mg/dL — ABNORMAL HIGH (ref 70–99)
Glucose-Capillary: 123 mg/dL — ABNORMAL HIGH (ref 70–99)
Glucose-Capillary: 130 mg/dL — ABNORMAL HIGH (ref 70–99)
Glucose-Capillary: 130 mg/dL — ABNORMAL HIGH (ref 70–99)
Glucose-Capillary: 133 mg/dL — ABNORMAL HIGH (ref 70–99)

## 2021-11-15 LAB — BASIC METABOLIC PANEL
Anion gap: 11 (ref 5–15)
BUN: 15 mg/dL (ref 6–20)
CO2: 22 mmol/L (ref 22–32)
Calcium: 8.5 mg/dL — ABNORMAL LOW (ref 8.9–10.3)
Chloride: 99 mmol/L (ref 98–111)
Creatinine, Ser: 0.87 mg/dL (ref 0.44–1.00)
GFR, Estimated: >60 mL/min (ref >60)
Glucose, Bld: 131 mg/dL — ABNORMAL HIGH (ref 70–99)
Potassium: 3.9 mmol/L (ref 3.5–5.1)
Sodium: 132 mmol/L — ABNORMAL LOW (ref 135–145)

## 2021-11-15 LAB — POCT I-STAT 7, (LYTES, BLD GAS, ICA,H+H)
Acid-base deficit: 4 mmol/L — ABNORMAL HIGH (ref 0.0–2.0)
Bicarbonate: 23.5 mmol/L (ref 20.0–28.0)
Calcium, Ion: 1.26 mmol/L (ref 1.15–1.40)
HCT: 31 % — ABNORMAL LOW (ref 36.0–46.0)
Hemoglobin: 10.5 g/dL — ABNORMAL LOW (ref 12.0–15.0)
O2 Saturation: 89 %
Patient temperature: 98.5
Potassium: 4 mmol/L (ref 3.5–5.1)
Sodium: 133 mmol/L — ABNORMAL LOW (ref 135–145)
TCO2: 25 mmol/L (ref 22–32)
pCO2 arterial: 53.4 mmHg — ABNORMAL HIGH (ref 32–48)
pH, Arterial: 7.251 — ABNORMAL LOW (ref 7.35–7.45)
pO2, Arterial: 67 mmHg — ABNORMAL LOW (ref 83–108)

## 2021-11-15 LAB — URINE CULTURE: Culture: 1000 — AB

## 2021-11-15 LAB — PROTEIN AND GLUCOSE, CSF
Glucose, CSF: 21 mg/dL — CL (ref 40–70)
Total  Protein, CSF: 191 mg/dL — ABNORMAL HIGH (ref 15–45)

## 2021-11-15 LAB — PHOSPHORUS
Phosphorus: 2.7 mg/dL (ref 2.5–4.6)
Phosphorus: 2.4 mg/dL — ABNORMAL LOW (ref 2.5–4.6)

## 2021-11-15 LAB — MAGNESIUM
Magnesium: 2 mg/dL (ref 1.7–2.4)
Magnesium: 1.9 mg/dL (ref 1.7–2.4)

## 2021-11-15 LAB — CRYPTOCOCCAL ANTIGEN, CSF: Crypto Ag: NEGATIVE

## 2021-11-15 LAB — VANCOMYCIN, TROUGH: Vancomycin Tr: 23 ug/mL (ref 15–20)

## 2021-11-15 MED ORDER — SODIUM CHLORIDE 0.9 % IV SOLN: INTRAVENOUS | Status: DC

## 2021-11-15 MED ORDER — LINEZOLID 600 MG/300ML IV SOLN
600.0000 mg | Freq: Two times a day (BID) | INTRAVENOUS | Status: DC
  Administered 2021-11-15 – 2021-11-26 (×23): 600 mg via INTRAVENOUS
  Filled 2021-11-15 (×24): qty 300

## 2021-11-15 MED ORDER — VANCOMYCIN HCL 500 MG/100ML IV SOLN
500.0000 mg | Freq: Two times a day (BID) | INTRAVENOUS | Status: DC
  Administered 2021-11-15: 500 mg via INTRAVENOUS
  Filled 2021-11-15: qty 100

## 2021-11-15 MED ORDER — SODIUM CHLORIDE 0.9 % IV BOLUS
500.0000 mL | Freq: Once | INTRAVENOUS | Status: AC
  Administered 2021-11-15: 500 mL via INTRAVENOUS

## 2021-11-15 NOTE — Procedures (Signed)
Lumbar Puncture Procedure Note  ARCHER VISE  [id number]  1966-03-01  Date:11/15/21  Time:8:08 AM   Provider Performing:Alishba [place]   Procedure: Lumbar Puncture (25053)  Indication(s) Rule out meningitis  Consent Risks of the procedure as well as the alternatives and risks of each were explained to the patient and/or caregiver.  Consent for the procedure was obtained and is signed in the bedside chart  Anesthesia Topical only with 1% lidocaine    Time Out Verified patient identification, verified procedure, site/side was marked, verified correct patient position, special equipment/implants available, medications/allergies/relevant history reviewed, required imaging and test results available.   Sterile Technique Maximal sterile technique including sterile barrier drape, hand hygiene, sterile gown, sterile gloves, mask, hair covering.    Procedure Description Using palpation, approximate location of L3-L4 space identified.   Lidocaine used to anesthetize skin and subcutaneous tissue overlying this area.  A 20g spinal needle was then used to access the subarachnoid space. Opening pressure:Not obtained. Closing pressure:Not obtained. 12cc cloudy CSF obtained.  Complications/Tolerance None; patient tolerated the procedure well.   EBL Minimal   Specimen(s) CSF

## 2021-11-15 NOTE — Progress Notes (Addendum)
° °  [place] has been requested to perform a transesophageal echocardiogram on Kristen Murphy for bacteremia,  After careful review of history and examination, the risks and benefits of transesophageal echocardiogram have been explained including risks of esophageal damage, perforation (1:10,000 risk), bleeding, pharyngeal hematoma as well as other potential complications associated with conscious sedation including aspiration, arrhythmia, respiratory failure and death. Alternatives to treatment were discussed, questions were answered. Patient is currently intubated and unconscious. I called and got consent from her son Boone Master. He has agreed to proceed with procedure. Vikki Ports, PA-C, confirmed consent. Consent form signed and placed in paper chart.   Procedure will be done at bedside tomorrow 11/16/2021 around 1pm with Dr. Harrell Gave. Will place orders including NPO at midnight and order to hold tube feeds.  Darreld Mclean, PA-C 11/15/2021 10:32 AM

## 2021-11-15 NOTE — Progress Notes (Signed)
NAME:  Kristen Murphy, MRN:  [id number], DOB:  07-27-1966, LOS: 3 ADMISSION DATE:  11/12/2021, CONSULTATION DATE: 11/12/2021 REFERRING MD: Dr. Langston Masker, CHIEF COMPLAINT: PEA arrest  History of Present Illness:   56 year old woman with a history of tobacco use, cervical cancer ([facility]), post nephrectomy and colostomy, CAD with LAD stent 01/2019, diabetes, depression, hypertension, remote CVA without known deficits.  Also with a history of substance abuse.  Brought in by her son to the ED 2/19 with fever, altered mental status and slurred speech. Apparently has had a flulike illness for a week, was found confused on 2/19.  Some suspicion by son that she may have been taking more prescription narcotics, also taking many NSAIDs per his report.  She was disoriented in the ED, febrile 103F, hyperglycemic. In the ED she apparently experienced acute hypoxemia, had some blood from her mouth, question emesis versus hemoptysis.  Devolved to PEA and required emergent ET intubation and CPR.  ROSC after 2 minutes.  OG tube placed with bright red blood obtained.  Chest x-ray with mild right hilar prominence, more notable on postintubation film.  No infiltrates or effusions She has received empiric antibiotics, is receiving 1 unit PRBC, is about to start Protonix infusion. Hemodynamically stabilized, mechanically ventilated.   Pertinent  Medical History   Past Medical History:  Diagnosis Date   Anemia    Anxiety    Arthritis    Asthma    Cervical cancer ([place])    Chronic kidney disease    Coronary artery disease    Depression    Diabetes mellitus without complication (HCC)    Dyspnea    Headache    Heart murmur    Hx of adenomatous polyp of colon    Hypertension    Rectovaginal fistula    Seizures ([place])    Stercoral ulcer of rectum 12/2020   Stroke [facility])     Significant Hospital Events: Including procedures, antibiotic start and stop dates in addition to other pertinent events   Head CT 2/19 > no  acute CT findings, old left basal ganglia lacunar, old left frontal cortical and subcortical infarct MR Brain 11/15/21 - Numerous lesions with mild associated hemorrhage. Mild edema.  Interim History / Subjective:   MRI + multiple abscesses LP performed this morning Neuro and ID following patient  Objective   Blood pressure (!) 124/48, pulse (!) 123, temperature 98.7 F (37.1 C), resp. rate (!) 28, height 5\' 2"  (1.575 m), weight 82.6 kg, SpO2 93 %.    Vent Mode: PRVC FiO2 (%):  [75 %-100 %] 90 % Set Rate:  [28 bmp] 28 bmp Vt Set:  [400 mL] 400 mL PEEP:  [10 cmH20] 10 cmH20 Plateau Pressure:  [20 cmH20-22 cmH20] 22 cmH20   Intake/Output Summary (Last 24 hours) at 11/15/2021 0818 Last data filed at 11/15/2021 0700 Gross per 24 hour  Intake 1676.45 ml  Output 1070 ml  Net 606.45 ml   Filed Weights   11/12/21 1536 11/14/21 0500 11/15/21 0500  Weight: 75.6 kg 82.6 kg 82.6 kg   Physical Exam: General: Critically ill-appearing, sedated, unresponsive  HENT: [place], AT, ETT in place Eyes: EOMI, no scleral icterus Respiratory: Clear to auscultation bilaterally.  No crackles, wheezing or rales Cardiovascular: RRR, -M/R/G, no JVD GI: BS+, soft, nontender Extremities:-Edema,-tenderness Neuro: Sedated RASS goal -4. Minimally reactive PERRL  CTA 11/12/21 - RLL cavitary infiltrate CT A/P 11/12/21 - S/p colostomy, s/p right nephrectomy  WBC improved to 22  Resolved Hospital Problem list  Assessment & Plan:   Multiple brain abscesses Acute metabolic encephalopathy secondary to above Repeat CT head 2/19 neg for acute findings.  EEG neg for seizures.  Consider also substances as son concerned about possible narcotic use.  UDS only positive for benzo. MRI brain 2/21 numerous lesions with mild associated hemorrhage and edema -Continue Vanc, Cefepime and Flagyl -Appreciate Neuro recs -Appreciate ID recs -F/u LP studies -Order TEE, will contact cardiology  Acute respiratory failure with  hypoxemia secondary to aspiration/necrotizing pneumonia, meningitis Severe sepsis secondary Staph pneumonia.   Urine culture polymicrobial with 1k Enterobacter and 3K Pseudomona Sputum culture with 2/20 - Staph, pending final -Full vent support. PRVC 8 cc/kg. Wean FIO2/PEEP for SpO2 goal 88-95% -VAP  -PRN ABG and CXR -Continue antibiotics -F/u final sputum cultures   Acute cardiopulmonary arrest, PEA arrest -Defer TTM, CPR time 2 minutes, initiated immediately -Follow for any evidence of endorgan injury -Supportive care -Telemetry  Acute blood loss/chronic anemia - Hg stable History of NSAID abuse. EGD blood clots in gastric fundus/body, erosive gastropathy, no stigmata of bleeding but OG tube trauma in body -GI following -PPI BID -No history of alcohol, octreotide deferred  Hypertension History of CAD -Holding amlodipine, metoprolol, lisinopril until stabilizing -Hold aspirin in setting of GI blood loss  Diabetes with hyperglycemia -CBGs, SSI if > 180 -Home metformin on hold -Home Farxiga on hold  Hyponatremia, hypokalemia Hypocalcemia - corrected to 9 with low albumin -Replete K -Trend  Nutrition -Start TF today after cortrak  Best Practice (right click and "Reselect all SmartList Selections" daily)   Diet/type: tubefeeds DVT prophylaxis: SCD GI prophylaxis: PPI Lines: Central line Foley:  N/A Code Status:  full code Last date of multidisciplinary goals of care discussion [Updated son on 2/20]  Labs   CBC: Recent Labs  Lab 11/12/21 1508 11/12/21 1514 11/13/21 0228 11/13/21 0353 11/14/21 0315 11/14/21 0826 11/14/21 1838 11/15/21 0447 11/15/21 0449  WBC 18.6*  --  30.2*  --  26.7*  --   --   --  22.4*  NEUTROABS 16.7*  --   --   --   --   --   --   --   --   HGB 8.9*   < > 10.7*   < > 10.1* 11.2* 10.5* 10.5* 10.0*  HCT 28.9*   < > 35.1*   < > 32.8* 33.0* 31.0* 31.0* 32.6*  MCV 87.0  --  90.0  --  90.4  --   --   --  91.3  PLT 251  --  214  --  181   --   --   --  211   < > = values in this interval not displayed.    Basic abnormalities panel: Recent Labs  Lab 11/12/21 1508 11/12/21 1514 11/12/21 1759 11/12/21 1931 11/12/21 1946 11/13/21 0228 11/13/21 0353 11/14/21 0315 11/14/21 0824 11/14/21 0826 11/14/21 1400 11/14/21 1700 11/14/21 1838 11/15/21 0447 11/15/21 0449  NA 126* 129*   < >  --    < > 135   < > 131*  --  131* 132*  --  132* 133* 132*  K 3.0* 3.1*   < >  --    < > 3.8   < > 3.3*  --  3.9 3.9  --  3.7 4.0 3.9  CL 93* 97*  --   --   --  99  --  96*  --   --  99  --   --   --  99  CO2 20*  --   --   --   --  26  --  25  --   --  22  --   --   --  22  GLUCOSE 417* 412*  --   --   --  125*  --  213*  --   --  120*  --   --   --  131*  BUN 16 15  --   --   --  14  --  12  --   --  13  --   --   --  15  CREATININE 1.26* 1.10*  --   --   --  1.10*  --  1.06*  --   --  0.98  --   --   --  0.87  CALCIUM 7.9*  --   --   --   --  7.8*  --  7.6*  --   --  7.7*  --   --   --  8.5*  MG  --   --   --  2.0  --   --   --   --  1.7  --   --  1.6*  --   --  2.0  PHOS  --   --   --  5.7*  --   --   --   --  3.7  --   --  2.8  --   --  2.7   < > = values in this interval not displayed.   GFR: Estimated Creatinine Clearance: 71.9 mL/min (by C-G formula based on SCr of 0.87 mg/dL). Recent Labs  Lab 11/12/21 1508 11/12/21 1518 11/12/21 1931 11/12/21 2233 11/13/21 0228 11/14/21 0315 11/14/21 0824 11/15/21 0449  PROCALCITON  --   --  2.04  --   --   --   --   --   WBC 18.6*  --   --   --  30.2* 26.7*  --  22.4*  LATICACIDVEN  --  1.5 2.5* 1.9  --   --  1.2  --     Liver Function Tests: Recent Labs  Lab 11/12/21 1508 11/13/21 0228  AST 12* 84*  ALT 12 51*  ALKPHOS 58 65  BILITOT 0.3 0.2*  PROT 7.0 6.4*  ALBUMIN 2.5* 2.3*   No results for input(s): LIPASE, AMYLASE in the last 168 hours. No results for input(s): AMMONIA in the last 168 hours.  ABG    Component Value Date/Time   PHART 7.251 (L) 11/15/2021 0447    PCO2ART 53.4 (H) 11/15/2021 0447   PO2ART 67 (L) 11/15/2021 0447   HCO3 23.5 11/15/2021 0447   TCO2 25 11/15/2021 0447   ACIDBASEDEF 4.0 (H) 11/15/2021 0447   O2SAT 89 11/15/2021 0447     Coagulation Profile: Recent Labs  Lab 11/12/21 1508  INR 1.2    Cardiac Enzymes: No results for input(s): CKTOTAL, CKMB, CKMBINDEX, TROPONINI in the last 168 hours.  HbA1C: Hgb A1c MFr Bld  Date/Time Value Ref Range Status  11/12/2021 07:31 PM 7.2 (H) 4.8 - 5.6 % Final    Comment:    (NOTE) Pre diabetes:          5.7%-6.4%  Diabetes:              >6.4%  Glycemic control for   <7.0% adults with diabetes   02/19/2019 03:32 AM 9.2 (H) 4.8 - 5.6 % Final    Comment:    (  NOTE) Pre diabetes:          5.7%-6.4% Diabetes:              >6.4% Glycemic control for   <7.0% adults with diabetes     CBG: Recent Labs  Lab 11/14/21 1110 11/14/21 1737 11/14/21 2023 11/14/21 2338 11/15/21 0444  GLUCAP 108* 134* 116* 118* 130*   Critical care time: 60 minutes   The patient is critically ill with multiple organ systems failure and requires high complexity decision making for assessment and support, frequent evaluation and titration of therapies, application of advanced monitoring technologies and extensive interpretation of multiple databases.  Independent Critical Care Time: 60 Minutes.   Rodman Pickle, M.D. [facility] Pulmonary/Critical Care Medicine 11/15/2021 8:18 AM   Please see Amion for pager number to reach on-call Pulmonary and Critical Care Team.

## 2021-11-15 NOTE — Procedures (Signed)
Cortrak  Person Inserting Tube:  Esaw Dace, RD Tube Type:  Cortrak - 43 inches Tube Size:  10 Tube Location:  Left nare Initial Placement:  Stomach Secured by: Bridle Technique Used to Measure Tube Placement:  Marking at nare/corner of mouth Cortrak Secured At:  67 cm Procedure Comments:  Cortrak Tube Team Note:  Consult received to place a Cortrak feeding tube.   X-ray is required, abdominal x-ray has been ordered by the Cortrak team. Please confirm tube placement before using the Cortrak tube.   If the tube becomes dislodged please keep the tube and contact the Cortrak team at [email or web address] ([email or web address]) for replacement.  If after hours and replacement cannot be delayed, place a NG tube and confirm placement with an abdominal x-ray.   Kerman Passey MS, RDN, LDN, CNSC Registered Dietitian III Clinical Nutrition RD Pager and On-Call Pager Number Located in [place]

## 2021-11-15 NOTE — Progress Notes (Signed)
Subjective: Patient is lying in bed on vent in NAD on 100 mcg Fentanyl and 15 mcg propofol obtunded and nonresponsive.  Patient had episodes of dyssynchrony with the ventilator requiring up titration of sedation overnight. No new neurological issues or seizure like activity overnight per RN. No family at bedside. LP just done.   Objective: Current vital signs: BP (!) 124/48    Pulse (!) 123    Temp 98.7 F (37.1 C)    Resp (!) 28    Ht _0  (1.575 m)    Wt 82.6 kg    SpO2 90%    BMI 33.31 kg/m  Vital signs in last 24 hours: Temp:  [97 F (36.1 C)-100 F (37.8 C)] 98.7 F (37.1 C) (02/22 0700) Pulse Rate:  [105-126] 123 (02/22 0600) Resp:  [21-32] 28 (02/22 0500) BP: (113-155)/(48-63) 124/48 (02/22 0310) SpO2:  [90 %-100 %] 90 % (02/22 0600) Arterial Line BP: (91-166)/(45-62) 122/51 (02/22 0600) FiO2 (%):  [75 %-100 %] 90 % (02/22 0700) Weight:  [82.6 kg] 82.6 kg (02/22 0500)  Intake/Output from previous day: 02/21 0701 - 02/22 0700 In: 1748.1 [I.V.:296.3; IV Piggyback:1451.9] Out: 1070 [Urine:1070] Intake/Output this shift: No intake/output data recorded. Nutritional status:  Diet Order             Diet NPO time specified  Diet effective now                   Neurologic Exam: Ment: Patient has eyes closed, does not open eyes, is obtunded and non responsive to noxious stimuli. Patient does not follow commands.  CN: Pupils are pinpoint with upward, disconjugate gaze. Weak corneals bilaterally, no blink to threat, negative dolls eyes. No cough or gag. Is breathing over ventilator.  Motor/Sensory: All 4 extremities are are flaccid with no movement to noxious stimuli.   Lab Results: Results for orders placed or performed during the hospital encounter of 11/12/21 (from the past 48 hour(s))  Glucose, capillary     Status: Abnormal   Collection Time: 11/13/21  7:34 AM  Result Value Ref Range   Glucose-Capillary 110 (H) 70 - 99 mg/dL    Comment: Glucose reference range  applies only to samples taken after fasting for at least 8 hours.  Glucose, capillary     Status: None   Collection Time: 11/13/21 11:03 AM  Result Value Ref Range   Glucose-Capillary 82 70 - 99 mg/dL    Comment: Glucose reference range applies only to samples taken after fasting for at least 8 hours.  I-STAT 7, (LYTES, BLD GAS, ICA, H+H)     Status: Abnormal   Collection Time: 11/13/21  3:40 PM  Result Value Ref Range   pH, Arterial 7.249 (L) 7.35 - 7.45   pCO2 arterial 61.4 (H) 32 - 48 mmHg   pO2, Arterial 85 83 - 108 mmHg   Bicarbonate 26.8 20.0 - 28.0 mmol/L   TCO2 29 22 - 32 mmol/L   O2 Saturation 94 %   Acid-base deficit 1.0 0.0 - 2.0 mmol/L   Sodium 132 (L) 135 - 145 mmol/L   Potassium 3.5 3.5 - 5.1 mmol/L   Calcium, Ion 1.10 (L) 1.15 - 1.40 mmol/L   HCT 36.0 36.0 - 46.0 %   Hemoglobin 12.2 12.0 - 15.0 g/dL   Patient temperature 37.3 C    Collection site RADIAL, ALLEN'S TEST ACCEPTABLE    Drawn by RT    Sample type ARTERIAL   Glucose, capillary     Status:  Abnormal   Collection Time: 11/13/21  3:55 PM  Result Value Ref Range   Glucose-Capillary 119 (H) 70 - 99 mg/dL    Comment: Glucose reference range applies only to samples taken after fasting for at least 8 hours.  I-STAT 7, (LYTES, BLD GAS, ICA, H+H)     Status: Abnormal   Collection Time: 11/13/21  6:21 PM  Result Value Ref Range   pH, Arterial 7.250 (L) 7.35 - 7.45   pCO2 arterial 52.5 (H) 32 - 48 mmHg   pO2, Arterial 68 (L) 83 - 108 mmHg   Bicarbonate 23.0 20.0 - 28.0 mmol/L   TCO2 25 22 - 32 mmol/L   O2 Saturation 89 %   Acid-base deficit 4.0 (H) 0.0 - 2.0 mmol/L   Sodium 133 (L) 135 - 145 mmol/L   Potassium 3.1 (L) 3.5 - 5.1 mmol/L   Calcium, Ion 1.08 (L) 1.15 - 1.40 mmol/L   HCT 32.0 (L) 36.0 - 46.0 %   Hemoglobin 10.9 (L) 12.0 - 15.0 g/dL   Patient temperature 37.1 C    Sample type ARTERIAL   Glucose, capillary     Status: Abnormal   Collection Time: 11/13/21  7:57 PM  Result Value Ref Range    Glucose-Capillary 192 (H) 70 - 99 mg/dL    Comment: Glucose reference range applies only to samples taken after fasting for at least 8 hours.  I-STAT 7, (LYTES, BLD GAS, ICA, H+H)     Status: Abnormal   Collection Time: 11/13/21  8:31 PM  Result Value Ref Range   pH, Arterial 7.273 (L) 7.35 - 7.45   pCO2 arterial 53.5 (H) 32 - 48 mmHg   pO2, Arterial 75 (L) 83 - 108 mmHg   Bicarbonate 24.7 20.0 - 28.0 mmol/L   TCO2 26 22 - 32 mmol/L   O2 Saturation 92 %   Acid-base deficit 3.0 (H) 0.0 - 2.0 mmol/L   Sodium 130 (L) 135 - 145 mmol/L   Potassium 3.6 3.5 - 5.1 mmol/L   Calcium, Ion 1.11 (L) 1.15 - 1.40 mmol/L   HCT 34.0 (L) 36.0 - 46.0 %   Hemoglobin 11.6 (L) 12.0 - 15.0 g/dL   Patient temperature 37.1 C    Collection site art line    Drawn by HIDE    Sample type ARTERIAL   I-STAT 7, (LYTES, BLD GAS, ICA, H+H)     Status: Abnormal   Collection Time: 11/13/21  9:10 PM  Result Value Ref Range   pH, Arterial 7.265 (L) 7.35 - 7.45   pCO2 arterial 52.8 (H) 32 - 48 mmHg   pO2, Arterial 76 (L) 83 - 108 mmHg   Bicarbonate 23.9 20.0 - 28.0 mmol/L   TCO2 26 22 - 32 mmol/L   O2 Saturation 92 %   Acid-base deficit 3.0 (H) 0.0 - 2.0 mmol/L   Sodium 131 (L) 135 - 145 mmol/L   Potassium 3.4 (L) 3.5 - 5.1 mmol/L   Calcium, Ion 1.09 (L) 1.15 - 1.40 mmol/L   HCT 33.0 (L) 36.0 - 46.0 %   Hemoglobin 11.2 (L) 12.0 - 15.0 g/dL   Patient temperature 37.0 C    Collection site art line    Drawn by RT    Sample type ARTERIAL   Glucose, capillary     Status: Abnormal   Collection Time: 11/13/21 11:48 PM  Result Value Ref Range   Glucose-Capillary 224 (H) 70 - 99 mg/dL    Comment: Glucose reference range applies only to samples  taken after fasting for at least 8 hours.  Glucose, capillary     Status: Abnormal   Collection Time: 11/14/21  3:06 AM  Result Value Ref Range   Glucose-Capillary 218 (H) 70 - 99 mg/dL    Comment: Glucose reference range applies only to samples taken after fasting for at  least 8 hours.  CBC     Status: Abnormal   Collection Time: 11/14/21  3:15 AM  Result Value Ref Range   WBC 26.7 (H) 4.0 - 10.5 K/uL   RBC 3.63 (L) 3.87 - 5.11 MIL/uL   Hemoglobin 10.1 (L) 12.0 - 15.0 g/dL   HCT 32.8 (L) 36.0 - 46.0 %   MCV 90.4 80.0 - 100.0 fL   MCH 27.8 26.0 - 34.0 pg   MCHC 30.8 30.0 - 36.0 g/dL   RDW 16.0 (H) 11.5 - 15.5 %   Platelets 181 150 - 400 K/uL   nRBC 0.1 0.0 - 0.2 %    Comment: Performed at [facility], [place] [address] [place], [place] [address]  Basic metabolic panel     Status: Abnormal   Collection Time: 11/14/21  3:15 AM  Result Value Ref Range   Sodium 131 (L) 135 - 145 mmol/L   Potassium 3.3 (L) 3.5 - 5.1 mmol/L   Chloride 96 (L) 98 - 111 mmol/L   CO2 25 22 - 32 mmol/L   Glucose, Bld 213 (H) 70 - 99 mg/dL    Comment: Glucose reference range applies only to samples taken after fasting for at least 8 hours.   BUN 12 6 - 20 mg/dL   Creatinine, Ser 1.06 (H) 0.44 - 1.00 mg/dL   Calcium 7.6 (L) 8.9 - 10.3 mg/dL   GFR, Estimated >60 >60 mL/min    Comment: (NOTE) Calculated using the CKD-EPI Creatinine Equation (2021)    Anion gap 10 5 - 15    Comment: Performed at [facility] [address], [place], [place] [address]  Glucose, capillary     Status: Abnormal   Collection Time: 11/14/21  7:49 AM  Result Value Ref Range   Glucose-Capillary 178 (H) 70 - 99 mg/dL    Comment: Glucose reference range applies only to samples taken after fasting for at least 8 hours.  Lactic acid, plasma     Status: None   Collection Time: 11/14/21  8:24 AM  Result Value Ref Range   Lactic Acid, Venous 1.2 0.5 - 1.9 mmol/L    Comment: Performed at [facility] [address] [place], [place] [address]  Magnesium     Status: None   Collection Time: 11/14/21  8:24 AM  Result Value Ref Range   Magnesium 1.7 1.7 - 2.4 mg/dL    Comment: Performed at [facility], [place] [address] [place], [place] [address]  Phosphorus     Status: None    Collection Time: 11/14/21  8:24 AM  Result Value Ref Range   Phosphorus 3.7 2.5 - 4.6 mg/dL    Comment: Performed at [facility] [address] [place], [place] [address]  I-STAT 7, (LYTES, BLD GAS, ICA, H+H)     Status: Abnormal   Collection Time: 11/14/21  8:26 AM  Result Value Ref Range   pH, Arterial 7.244 (L) 7.35 - 7.45   pCO2 arterial 62.0 (H) 32 - 48 mmHg   pO2, Arterial 68 (L) 83 - 108 mmHg   Bicarbonate 26.7 20.0 - 28.0 mmol/L  TCO2 29 22 - 32 mmol/L   O2 Saturation 89 %   Acid-base deficit 1.0 0.0 - 2.0 mmol/L   Sodium 131 (L) 135 - 145 mmol/L   Potassium 3.9 3.5 - 5.1 mmol/L   Calcium, Ion 1.16 1.15 - 1.40 mmol/L   HCT 33.0 (L) 36.0 - 46.0 %   Hemoglobin 11.2 (L) 12.0 - 15.0 g/dL   Patient temperature 99.2 F    Sample type ARTERIAL   Glucose, capillary     Status: Abnormal   Collection Time: 11/14/21 11:10 AM  Result Value Ref Range   Glucose-Capillary 108 (H) 70 - 99 mg/dL    Comment: Glucose reference range applies only to samples taken after fasting for at least 8 hours.  Basic metabolic panel     Status: Abnormal   Collection Time: 11/14/21  2:00 PM  Result Value Ref Range   Sodium 132 (L) 135 - 145 mmol/L   Potassium 3.9 3.5 - 5.1 mmol/L   Chloride 99 98 - 111 mmol/L   CO2 22 22 - 32 mmol/L   Glucose, Bld 120 (H) 70 - 99 mg/dL    Comment: Glucose reference range applies only to samples taken after fasting for at least 8 hours.   BUN 13 6 - 20 mg/dL   Creatinine, Ser 0.98 0.44 - 1.00 mg/dL   Calcium 7.7 (L) 8.9 - 10.3 mg/dL   GFR, Estimated >60 >60 mL/min    Comment: (NOTE) Calculated using the CKD-EPI Creatinine Equation (2021)    Anion gap 11 5 - 15    Comment: Performed at [facility] [address], [place], [place] [address]  Magnesium     Status: Abnormal   Collection Time: 11/14/21  5:00 PM  Result Value Ref Range   Magnesium 1.6 (L) 1.7 - 2.4 mg/dL    Comment: Performed at [facility] [address] [place],  [place] [address]  Phosphorus     Status: None   Collection Time: 11/14/21  5:00 PM  Result Value Ref Range   Phosphorus 2.8 2.5 - 4.6 mg/dL    Comment: Performed at [facility] [address], [place], [place] [address]  Glucose, capillary     Status: Abnormal   Collection Time: 11/14/21  5:37 PM  Result Value Ref Range   Glucose-Capillary 134 (H) 70 - 99 mg/dL    Comment: Glucose reference range applies only to samples taken after fasting for at least 8 hours.  I-STAT 7, (LYTES, BLD GAS, ICA, H+H)     Status: Abnormal   Collection Time: 11/14/21  6:38 PM  Result Value Ref Range   pH, Arterial 7.255 (L) 7.35 - 7.45   pCO2 arterial 58.2 (H) 32 - 48 mmHg   pO2, Arterial 86 83 - 108 mmHg   Bicarbonate 26.0 20.0 - 28.0 mmol/L   TCO2 28 22 - 32 mmol/L   O2 Saturation 95 %   Acid-base deficit 2.0 0.0 - 2.0 mmol/L   Sodium 132 (L) 135 - 145 mmol/L   Potassium 3.7 3.5 - 5.1 mmol/L   Calcium, Ion 1.20 1.15 - 1.40 mmol/L   HCT 31.0 (L) 36.0 - 46.0 %   Hemoglobin 10.5 (L) 12.0 - 15.0 g/dL   Patient temperature 97.6 F    Sample type ARTERIAL   Glucose, capillary     Status: Abnormal   Collection Time: 11/14/21  8:23 PM  Result Value Ref Range   Glucose-Capillary 116 (H) 70 - 99  mg/dL    Comment: Glucose reference range applies only to samples taken after fasting for at least 8 hours.  Glucose, capillary     Status: Abnormal   Collection Time: 11/14/21 11:38 PM  Result Value Ref Range   Glucose-Capillary 118 (H) 70 - 99 mg/dL    Comment: Glucose reference range applies only to samples taken after fasting for at least 8 hours.  Glucose, capillary     Status: Abnormal   Collection Time: 11/15/21  4:44 AM  Result Value Ref Range   Glucose-Capillary 130 (H) 70 - 99 mg/dL    Comment: Glucose reference range applies only to samples taken after fasting for at least 8 hours.  I-STAT 7, (LYTES, BLD GAS, ICA, H+H)     Status: Abnormal   Collection Time: 11/15/21  4:47 AM  Result Value Ref Range    pH, Arterial 7.251 (L) 7.35 - 7.45   pCO2 arterial 53.4 (H) 32 - 48 mmHg   pO2, Arterial 67 (L) 83 - 108 mmHg   Bicarbonate 23.5 20.0 - 28.0 mmol/L   TCO2 25 22 - 32 mmol/L   O2 Saturation 89 %   Acid-base deficit 4.0 (H) 0.0 - 2.0 mmol/L   Sodium 133 (L) 135 - 145 mmol/L   Potassium 4.0 3.5 - 5.1 mmol/L   Calcium, Ion 1.26 1.15 - 1.40 mmol/L   HCT 31.0 (L) 36.0 - 46.0 %   Hemoglobin 10.5 (L) 12.0 - 15.0 g/dL   Patient temperature 98.5 F    Sample type ARTERIAL   CBC     Status: Abnormal   Collection Time: 11/15/21  4:49 AM  Result Value Ref Range   WBC 22.4 (H) 4.0 - 10.5 K/uL   RBC 3.57 (L) 3.87 - 5.11 MIL/uL   Hemoglobin 10.0 (L) 12.0 - 15.0 g/dL   HCT 32.6 (L) 36.0 - 46.0 %   MCV 91.3 80.0 - 100.0 fL   MCH 28.0 26.0 - 34.0 pg   MCHC 30.7 30.0 - 36.0 g/dL   RDW 16.1 (H) 11.5 - 15.5 %   Platelets 211 150 - 400 K/uL   nRBC 0.3 (H) 0.0 - 0.2 %    Comment: Performed at [facility], [address], [place], [place] [address]  Basic metabolic panel     Status: Abnormal   Collection Time: 11/15/21  4:49 AM  Result Value Ref Range   Sodium 132 (L) 135 - 145 mmol/L   Potassium 3.9 3.5 - 5.1 mmol/L   Chloride 99 98 - 111 mmol/L   CO2 22 22 - 32 mmol/L   Glucose, Bld 131 (H) 70 - 99 mg/dL    Comment: Glucose reference range applies only to samples taken after fasting for at least 8 hours.   BUN 15 6 - 20 mg/dL   Creatinine, Ser 0.87 0.44 - 1.00 mg/dL   Calcium 8.5 (L) 8.9 - 10.3 mg/dL   GFR, Estimated >60 >60 mL/min    Comment: (NOTE) Calculated using the CKD-EPI Creatinine Equation (2021)    Anion gap 11 5 - 15    Comment: Performed at [facility] [address] [place], [place] [address]  Magnesium     Status: None   Collection Time: 11/15/21  4:49 AM  Result Value Ref Range   Magnesium 2.0 1.7 - 2.4 mg/dL    Comment: Performed at [facility] [address], [place], [place] [address]  Phosphorus     Status: None  Collection Time: 11/15/21   4:49 AM  Result Value Ref Range   Phosphorus 2.7 2.5 - 4.6 mg/dL    Comment: Performed at [facility], [place] [address] [place], [place] [address]    Recent Results (from the past 240 hour(s))  Urine Culture     Status: Abnormal (Preliminary result)   Collection Time: 11/12/21  3:36 PM   Specimen: In/Out Cath Urine  Result Value Ref Range Status   Specimen Description IN/OUT CATH URINE  Final   Special Requests   Final    NONE Performed at [facility], [place] [address] [place], [place] [address]    Culture (A)  Final    1,000 COLONIES/mL GRAM NEGATIVE RODS 3,000 COLONIES/mL PSEUDOMONAS AERUGINOSA    Report Status PENDING  Incomplete  Respiratory (~20 pathogens) panel by PCR     Status: None   Collection Time: 11/12/21  3:37 PM   Specimen: Nasopharyngeal Swab; Respiratory  Result Value Ref Range Status   Adenovirus NOT DETECTED NOT DETECTED Final   Coronavirus 229E NOT DETECTED NOT DETECTED Final    Comment: (NOTE) The Coronavirus on the Respiratory Panel, DOES NOT test for the novel  Coronavirus (2019 nCoV)    Coronavirus HKU1 NOT DETECTED NOT DETECTED Final   Coronavirus NL63 NOT DETECTED NOT DETECTED Final   Coronavirus OC43 NOT DETECTED NOT DETECTED Final   Metapneumovirus NOT DETECTED NOT DETECTED Final   Rhinovirus / Enterovirus NOT DETECTED NOT DETECTED Final   Influenza A NOT DETECTED NOT DETECTED Final   Influenza B NOT DETECTED NOT DETECTED Final   Parainfluenza Virus 1 NOT DETECTED NOT DETECTED Final   Parainfluenza Virus 2 NOT DETECTED NOT DETECTED Final   Parainfluenza Virus 3 NOT DETECTED NOT DETECTED Final   Parainfluenza Virus 4 NOT DETECTED NOT DETECTED Final   Respiratory Syncytial Virus NOT DETECTED NOT DETECTED Final   Bordetella pertussis NOT DETECTED NOT DETECTED Final   Bordetella Parapertussis NOT DETECTED NOT DETECTED Final   Chlamydophila pneumoniae NOT DETECTED NOT DETECTED Final   Mycoplasma pneumoniae NOT DETECTED NOT DETECTED  Final    Comment: Performed at [facility], [place] [address], [place], [place] [address]  Blood Culture (routine x 2)     Status: None (Preliminary result)   Collection Time: 11/12/21  4:23 PM   Specimen: Site Not Specified; Blood  Result Value Ref Range Status   Specimen Description SITE NOT SPECIFIED  Final   Special Requests   Final    BOTTLES DRAWN AEROBIC AND ANAEROBIC Blood Culture adequate volume   Culture   Final    NO GROWTH 2 DAYS Performed at [facility], [place] [address], [place],  [address]    Report Status PENDING  Incomplete  Resp Panel by RT-PCR (Flu A&B, Covid) Nasopharyngeal Swab     Status: None   Collection Time: 11/12/21  4:25 PM   Specimen: Nasopharyngeal Swab; Nasopharyngeal(NP) swabs in vial transport medium  Result Value Ref Range Status   SARS Coronavirus 2 by RT PCR NEGATIVE NEGATIVE Final    Comment: (NOTE) SARS-CoV-2 target nucleic acids are NOT DETECTED.  The SARS-CoV-2 RNA is generally detectable in upper respiratory specimens during the acute phase of infection. The lowest concentration of SARS-CoV-2 viral copies this assay can detect is 138 copies/mL. A negative result does not preclude SARS-Cov-2 infection and should not be used as the sole basis for treatment or other patient management decisions. A negative result may occur with  improper specimen collection/handling, submission of specimen other than nasopharyngeal swab, presence of viral mutation(s) within the areas targeted by this assay, and inadequate number of viral copies(<138 copies/mL). A negative result must be combined with clinical observations, patient history, and epidemiological information. The expected result is Negative.  Fact Sheet for Patients:  EntrepreneurPulse.com.au  Fact Sheet for Healthcare Providers:  IncredibleEmployment.be  This test is no t yet approved or cleared by the [place] FDA and  has been  authorized for detection and/or diagnosis of SARS-CoV-2 by FDA under an Emergency Use Authorization (EUA). This EUA will remain  in effect (meaning this test can be used) for the duration of the COVID-19 declaration under Section 564(b)(1) of the Act, 21 U.S.C.section 360bbb-3(b)(1), unless the authorization is terminated  or revoked sooner.       Influenza A by PCR NEGATIVE NEGATIVE Final   Influenza B by PCR NEGATIVE NEGATIVE Final    Comment: (NOTE) The Xpert Xpress SARS-CoV-2/FLU/RSV plus assay is intended as an aid in the diagnosis of influenza from Nasopharyngeal swab specimens and should not be used as a sole basis for treatment. Nasal washings and aspirates are unacceptable for Xpert Xpress SARS-CoV-2/FLU/RSV testing.  Fact Sheet for Patients: EntrepreneurPulse.com.au  Fact Sheet for Healthcare Providers: IncredibleEmployment.be  This test is not yet approved or cleared by the [place] FDA and has been authorized for detection and/or diagnosis of SARS-CoV-2 by FDA under an Emergency Use Authorization (EUA). This EUA will remain in effect (meaning this test can be used) for the duration of the COVID-19 declaration under Section 564(b)(1) of the Act, 21 U.S.C. section 360bbb-3(b)(1), unless the authorization is terminated or revoked.  Performed at [facility], [place] [address], [place], [place] [address]   Blood Culture (routine x 2)     Status: None (Preliminary result)   Collection Time: 11/12/21  4:44 PM   Specimen: Site Not Specified; Blood  Result Value Ref Range Status   Specimen Description SITE NOT SPECIFIED  Final   Special Requests   Final    BOTTLES DRAWN AEROBIC AND ANAEROBIC Blood Culture results may not be optimal due to an excessive volume of blood received in culture bottles   Culture   Final    NO GROWTH 2 DAYS Performed at [facility], [place] [address], [place], [place] [address]    Report Status  PENDING  Incomplete  MRSA Next Gen by PCR, Nasal     Status: Abnormal   Collection Time: 11/12/21  7:36 PM   Specimen: Nasal Mucosa; Nasal Swab  Result Value Ref Range Status   MRSA by PCR Next Gen DETECTED (A) NOT DETECTED Final    Comment: RESULT CALLED TO, READ BACK BY AND VERIFIED WITH: V NGUYEN,RN_[id number]  11/12/21 [facility] (NOTE) The GeneXpert MRSA Assay (FDA approved for NASAL specimens only), is one component of a comprehensive MRSA colonization surveillance program. It is not intended to diagnose MRSA infection nor to guide or monitor treatment for MRSA infections. Test performance is not FDA approved in patients less than 80 years old. Performed at [facility], [place] [address] [place], [place] [address]   Culture, Respiratory w Gram Stain     Status: None (Preliminary result)   Collection Time: 11/13/21  2:09 AM   Specimen: Bronchoalveolar Lavage; Respiratory  Result Value Ref Range Status   Specimen Description BRONCHIAL ALVEOLAR LAVAGE  Final   Special Requests NONE  Final   Gram Stain   Final  MODERATE WBC PRESENT, PREDOMINANTLY PMN FEW GRAM POSITIVE COCCI IN PAIRS AND CHAINS    Culture   Final    FEW STAPHYLOCOCCUS AUREUS SUSCEPTIBILITIES TO FOLLOW Performed at [facility], [place] [address] [place], [place] [address]    Report Status PENDING  Incomplete    Lipid Panel Recent Labs    11/13/21 0228  TRIG 314*    Studies/Results: MR BRAIN W WO CONTRAST  Result Date: 11/14/2021 CLINICAL DATA:  Mental status change.  Leukocytosis EXAM: MRI HEAD WITHOUT AND WITH CONTRAST TECHNIQUE: Multiplanar, multiecho pulse sequences of the brain and surrounding structures were obtained without and with intravenous contrast. CONTRAST:  8.24m GADAVIST GADOBUTROL 1 MMOL/ML IV SOLN COMPARISON:  CT head 11/12/2021 FINDINGS: Brain: Numerous small lesions are seen throughout the brain. These are best seen on diffusion-weighted imaging and show restricted diffusion. Some of the  lesions show mild peripheral enhancement. Some of the lesions show mild surrounding edema on FLAIR. There are lesions in both cerebellar hemispheres bilaterally measuring under 1 cm. Numerous lesions are seen throughout both cerebral hemispheres. Most of lesions are near the gray-white junction but there also are lesions in the right thalamus and putamen bilaterally and in the splenium. There is also a linear area of restricted diffusion within the left lateral ventricle. These lesions are felt to be due to infection and brain abscesses. There is likely ventriculitis on the left. Many of the lesions show susceptibility compatible with small areas of hemorrhage Chronic infarct left frontal lobe. Chronic hemorrhage in the left frontal infarct. Ventricle size normal. No midline shift. Vascular: Normal arterial flow voids Skull and upper cervical spine: No focal lesion. Sinuses/Orbits: Extensive mucosal edema throughout the paranasal sinuses. Air-fluid level right maxillary sinus. Bilateral mastoid effusion. Negative orbit Other: None IMPRESSION: Numerous lesions in the brain. There are lesions in the cerebellar hemispheres and in both cerebral hemispheres widely distributed. Greater than 50 lesions are identified. The lesions have a rounded appearance with restricted diffusion and mild enhancement. Many lesions show evidence of mild associated hemorrhage. Mild edema. Restricted diffusion in the left lateral ventricle likely due to ventriculitis. 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 Differential diagnosis includes metastatic disease and demyelinating disease. Correlate with lumbar puncture results. These results were called by telephone at the time of interpretation on 11/14/2021 at 4:46 pm to provider CHI ELLISON , who verbally acknowledged these results. Electronically Signed   By: CFranchot GalloM.D.   On: 11/14/2021 16:48   EEG adult  Result Date:  11/13/2021 YLora Havens MD     11/13/2021  1:18 PM Patient Name: Kristen VERGAMRN: [id number] Attending: PLora HavensReferring Physician/Provider: LKerney Elbe MD Date: 11/13/2021 Duration: 22.22 mins Patient history: 56year old woman with a history of cervical cancer, status post nephrectomy 2 months ago, colostomy, CAD, diabetes, depression, hypertension, remote history of stroke without residual deficits who was brought in by EMS for altered mental status.  EEG to evaluate for seizure. Level of alertness: obtunded AEDs during EEG study: versed, propofol, LEV Technical aspects: This EEG study was done with scalp electrodes positioned according to the 10-20 International system of electrode placement. Electrical activity was acquired at a sampling rate of _0  and reviewed with a high frequency filter of _1  and a low frequency filter of _2 . EEG data were recorded continuously and digitally stored. Description: EEG showed continuous generalized background attenuation.  Hyperventilation and photic stimulation were not  performed.   Of note, study was technically difficult due to significant myogenic artifact ABNORMALITY -Background attenuation, generalized IMPRESSION: This technically difficult study is suggestive of profound degree of encephalopathy, nonspecific etiology.  No seizures or epileptiform discharges were seen throughout the recording. Lora Havens   ECHOCARDIOGRAM COMPLETE  Result Date: 11/13/2021    ECHOCARDIOGRAM REPORT   Patient Name:   Kristen Murphy Date of Exam: 11/13/2021 Medical Rec #:  [id number]       Height:       62.0 in Accession #:    [id number]      Weight:       166.7 lb Date of Birth:  June 20, 1966       BSA:          1.769 m Patient Age:    68 years        BP:           103/60 mmHg Patient Gender: F               HR:           88 bpm. Exam Location:  Inpatient Procedure: 2D Echo, Cardiac Doppler and Color Doppler Indications:    Cardiac arrest  History:         Patient has prior history of Echocardiogram examinations, most                 recent 02/19/2019. CAD, Stroke, Signs/Symptoms:Murmur and                 Dyspnea; Risk Factors:Hypertension and Diabetes. 02/19/2019 cath.  Sonographer:    Luisa Hart RDCS Referring Phys: [place]  1. Left ventricular ejection fraction, by estimation, is 65 to 70%. The left ventricle has normal function. The left ventricle has no regional wall motion abnormalities. Left ventricular diastolic parameters are consistent with Grade I diastolic dysfunction (impaired relaxation).  2. Right ventricular systolic function is normal. The right ventricular size is normal.  3. The mitral valve is normal in structure. No evidence of mitral valve regurgitation. Mild to moderate mitral stenosis.  4. The aortic valve is normal in structure. Aortic valve regurgitation is not visualized. Mild aortic valve stenosis. FINDINGS  Left Ventricle: Left ventricular ejection fraction, by estimation, is 65 to 70%. The left ventricle has normal function. The left ventricle has no regional wall motion abnormalities. The left ventricular internal cavity size was small. There is no left ventricular hypertrophy. Left ventricular diastolic parameters are consistent with Grade I diastolic dysfunction (impaired relaxation). Right Ventricle: The right ventricular size is normal. Right vetricular wall thickness was not well visualized. Right ventricular systolic function is normal. Left Atrium: Left atrial size was normal in size. Right Atrium: Right atrial size was normal in size. Pericardium: There is no evidence of pericardial effusion. Mitral Valve: The mitral valve is normal in structure. No evidence of mitral valve regurgitation. Mild to moderate mitral valve stenosis. MV peak gradient, 10.8 mmHg. The mean mitral valve gradient is 6.0 mmHg. Tricuspid Valve: The tricuspid valve is normal in structure. Tricuspid valve regurgitation is mild.  Aortic Valve: The aortic valve is normal in structure. Aortic valve regurgitation is not visualized. Mild aortic stenosis is present. Aortic valve mean gradient measures 10.0 mmHg. Aortic valve peak gradient measures 17.8 mmHg. Aortic valve area, by VTI measures 1.62 cm. Pulmonic Valve: The pulmonic valve was normal in structure. Pulmonic valve regurgitation is not visualized. Aorta: The aortic root and ascending aorta are structurally  normal, with no evidence of dilitation. IAS/Shunts: The atrial septum is grossly normal.  LEFT VENTRICLE PLAX 2D LVIDd:         3.70 cm     Diastology LVIDs:         2.40 cm     LV e' medial:    4.46 cm/s LV PW:         1.20 cm     LV E/e' medial:  19.3 LV IVS:        1.00 cm     LV e' lateral:   6.64 cm/s LVOT diam:     2.10 cm     LV E/e' lateral: 13.0 LV SV:         56 LV SV Index:   32 LVOT Area:     3.46 cm  LV Volumes (MOD) LV vol d, MOD A4C: 50.1 ml LV vol s, MOD A4C: 14.3 ml LV SV MOD A4C:     50.1 ml RIGHT VENTRICLE RV S prime:     19.60 cm/s TAPSE (M-mode): 2.4 cm LEFT ATRIUM             Index LA diam:        4.00 cm 2.26 cm/m LA Vol (A2C):   33.2 ml 18.77 ml/m LA Vol (A4C):   50.8 ml 28.71 ml/m LA Biplane Vol: 42.2 ml 23.85 ml/m  AORTIC VALVE                     PULMONIC VALVE AV Area (Vmax):    1.74 cm      PV Vmax:       1.36 m/s AV Area (Vmean):   1.63 cm      PV Vmean:      87.750 cm/s AV Area (VTI):     1.62 cm      PV VTI:        0.215 m AV Vmax:           211.00 cm/s   PV Peak grad:  7.3 mmHg AV Vmean:          144.000 cm/s  PV Mean grad:  4.0 mmHg AV VTI:            0.344 m AV Peak Grad:      17.8 mmHg AV Mean Grad:      10.0 mmHg LVOT Vmax:         106.00 cm/s LVOT Vmean:        67.700 cm/s LVOT VTI:          0.161 m LVOT/AV VTI ratio: 0.47  AORTA Ao Asc diam: 3.10 cm MITRAL VALVE                TRICUSPID VALVE MV Area (PHT): 2.95 cm     TR Peak grad:   12.8 mmHg MV Area VTI:   1.13 cm     TR Vmax:        179.00 cm/s MV Peak grad:  10.8 mmHg MV Mean  grad:  6.0 mmHg     SHUNTS MV Vmax:       1.64 m/s     Systemic VTI:  0.16 m MV Vmean:      112.0 cm/s   Systemic Diam: 2.10 cm MV Decel Time: 257 msec MV E velocity: 86.10 cm/s MV A velocity: 117.00 cm/s MV E/A ratio:  0.74 Mertie Moores MD Electronically signed by Mertie Moores MD Signature Date/Time: 11/13/2021/12:23:16 PM  Final     Medications: Scheduled:  chlorhexidine gluconate (MEDLINE KIT)  15 mL Mouth Rinse BID   Chlorhexidine Gluconate Cloth  6 each Topical Daily   fentaNYL (SUBLIMAZE) injection  50 mcg Intravenous Once   insulin aspart  0-9 Units Subcutaneous Q4H   insulin aspart  3 Units Subcutaneous Q4H   mouth rinse  15 mL Mouth Rinse 10 times per day   [START ON 11/16/2021] pantoprazole  40 mg Intravenous Q12H   sodium chloride flush  3 mL Intravenous Once   Continuous:  sodium chloride     sodium chloride 5 mL/hr at 11/15/21 0700   ceFEPime (MAXIPIME) IV Stopped (11/15/21 0434)   feeding supplement (VITAL AF 1.2 CAL)     fentaNYL infusion INTRAVENOUS 100 mcg/hr (11/15/21 0700)   levETIRAcetam Stopped (11/14/21 2218)   metronidazole Stopped (11/15/21 0306)   midazolam Stopped (11/14/21 0459)   norepinephrine (LEVOPHED) Adult infusion Stopped (11/14/21 0916)   propofol (DIPRIVAN) infusion 15 mcg/kg/min (11/15/21 0700)   vancomycin Stopped (11/14/21 2058)   Assessment: 56 year old woman with a history of cervical cancer, status post nephrectomy 2 months ago, colostomy, chronic occlusion of the left common carotid artery, CAD, diabetes, depression, hypertension, remote history of stroke without residual deficits who was brought in by EMS for altered mental status. She had had a flulike illness for a week PTA and was found confused on 2/19. The patient had acute PEA arrest while in the ED on Sunday (2/19) with CPR time of 2 minutes. Diagnosed with acute respiratory failure with hypoxemia and was intubated at that time. MRI brain performed on Tuesday revealed numerous (> 50)widely  distributed lesions in the cerebral hemispheres as well as the cerebellum.  - Neurological exam with sedation is with minimal responses today. Overbreathing the vent. On Fentanyl and Propofol gtts.  - MRI brain with and without contrast: Numerous (> 50) small, round DWI+ lesions are scattered throughout the brain parenchyma, infratentorially and supratentorially, some with ring-like morphologies. On FLAIR images, several of the lesions exhibit adjacent vasogenic edema. Some of the lesions exhibit corresponding T1-hypointensity. A small subset of the lesions exhibit enhancement on the post-contrast images. Many lesions show evidence of mild associated hemorrhage. Restricted diffusion in the left lateral ventricle likely due to ventriculitis. 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 DDx for underlying pathogen includes bacterial (including tuberculosis), fungal (histoplasmosis, aspergillosis), protozoan (toxoplasmosis), parasitic (neurocysticercosis). Lower on the DDx would be an atypical presentation of CNS inflammatory disease such as MS or CNS lupus, or metastatic disease.    - Acute GI bleed, most recent hemoglobin 10.1 > 11.2.  History of NSAID abuse.  - EEG: Background attenuation, generalized. This technically difficult study is suggestive of profound degree of encephalopathy, nonspecific etiology.  No seizures or epileptiform discharges were seen throughout the recording.   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 Continue Vancomycin. Changed from Ceftriaxone to Cefepime yesterday. Started Flagyl. Stopped acyclovir.  - Will need ID consult. Need to discuss the possibility of her lung infectious process embolizing to brain, versus an occult SBE not seen on initial TTE.  - Neurosurgery has been consulted and has determined that the patient is not a good candidate for lesion biopsy - LP obtained. Labs ordered  for cell count with differential, protein, glucose, VDRL, AFB stain, gram stain, fungal stain, cryptococcal antigen, fungal and bacterial culture. - Discussed with CCM   35 minutes spent in the neurological evaluation and management  of this critically ill patient.       LOS: 3 days   _0  signed: Dr. Kerney Elbe 11/15/2021  7:09 AM

## 2021-11-15 NOTE — Consult Note (Signed)
[facility]    Date of Admission:  11/12/2021     Reason for Consult:  Brain abscess, cavitary pneumonia     Referring Physician: Dr Loanne Drilling  Current antibiotics: Cefepime Vancomycin Metronidazole   ASSESSMENT:    56 y.o. female admitted with:  Multiple brain abscesses MRSA pneumonia Possible liver lobe lesion Septic shock Acute hypoxemic respiratory failure Encephalopathy PEA arrest with ROSC Diabetes History of cervical cancer History of VRE UTI  Patient presenting with fever and encephalopathy concerning for CNS infection.  Work-up has revealed pneumonia with BAL cultures growing MRSA and MRI brain with multiple abscesses and CSF studies consistent with bacterial meningitis.  Multiple studies are currently pending.  Critical care discussed with neurosurgery yesterday who determined that the patient is not a good candidate for lesion aspiration or biopsy.  Differential includes bacterial infection with disseminated MRSA.  Interestingly, her blood cx are negative but would consider endocarditis and embolic disease.  Other etiologies that could cause pneumonia and CNS infection include Nocardia and Toxo although she is not significantly immune compromised.  Listeria also in the differential but does not typically present with abscess, in particular multiple lesions. Would think fungal and mycobacterial to be less likely.  RECOMMENDATIONS:    Continue antibiotics.   Will tailor therapy to include coverage for Listeria and Nocardia while still covering her MRSA isolate Stop Vancomycin.  Add Linezolid.  This covers her MRSA pneumonia as well as coverage for Listeria and Nocardia.  It will also enable coverage for VRE which she had in December although do not suspect this to be contributory at this point. Agree with TEE. MRI liver for better characterization of the lesion noted on prior CT. Appreciate neurology and CCM. Follow-up multiple CSF  studies. Will follow.    Principal Problem:   Brain abscess Active Problems:   Cervical cancer ([facility])   Diabetes mellitus without complication ([place])   Cardiac arrest ([place])   Respiratory failure (HCC)   Severe sepsis with septic shock (HCC)   MRSA pneumonia ([place])   MEDICATIONS:    Scheduled Meds:  chlorhexidine gluconate (MEDLINE KIT)  15 mL Mouth Rinse BID   Chlorhexidine Gluconate Cloth  6 each Topical Daily   fentaNYL (SUBLIMAZE) injection  50 mcg Intravenous Once   insulin aspart  0-9 Units Subcutaneous Q4H   insulin aspart  3 Units Subcutaneous Q4H   mouth rinse  15 mL Mouth Rinse 10 times per day   [START ON 11/16/2021] pantoprazole  40 mg Intravenous Q12H   sodium chloride flush  3 mL Intravenous Once   Continuous Infusions:  sodium chloride     sodium chloride 5 mL/hr at 11/15/21 0700   sodium chloride     ceFEPime (MAXIPIME) IV 2 g (11/15/21 1131)   feeding supplement (VITAL AF 1.2 CAL)     fentaNYL infusion INTRAVENOUS 100 mcg/hr (11/15/21 0837)   levETIRAcetam 1,500 mg (11/15/21 1132)   linezolid (ZYVOX) IV     metronidazole 500 mg (11/15/21 1026)   norepinephrine (LEVOPHED) Adult infusion 4 mcg/min (11/15/21 0833)   propofol (DIPRIVAN) infusion 20 mcg/kg/min (11/15/21 0844)   PRN Meds:.Place/Maintain arterial line **AND** sodium chloride, sodium chloride, albuterol, docusate, fentaNYL, polyethylene glycol  HPI:    Kristen Murphy is a 56 y.o. 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  Patient remains intubated and sedated in the ICU and much of the history was obtained via chart  review and with discussion from other providers.  She apparently had flulike symptoms for approximately 1 week prior to admission until her family found her confused on that date.  There was also a report that she had seen an outside provider and been given antibiotics however this cannot be confirmed at this time.  In the emergency department she was found to be febrile and hyperglycemic.  She experienced acute hypoxemia with emesis.  This led to PEA arrest requiring emergent intubation and CPR with ROSC after 2 minutes.    Imaging has been obtained of the chest, abdomen, pelvis.  Lung imaging shows a large mildly cavitary infiltrate in the superior segment of the right lower lobe consistent with acute pneumonia with some minimal infiltrate noted in the left base as well.  CT of the abdomen showed postsurgical changes consistent with her colostomy and right nephrectomy.  There was an area of decreased attenuation and enhancement within the lateral segment of the left lobe of the liver.  Given her presentation of fever, encephalopathy, leukocytosis there was concern from CNS infection.  She was started on vancomycin, ceftriaxone, acyclovir.  Fever curve and leukocytosis has improved and cultures thus far have been unremarkable with negative blood cultures x2 and urine cultures with a low colony count of Pseudomonas aeruginosa and Enterobacter cloacae I.  Of note her urinalysis was negative for nitrites with mild pyuria and only few bacteria which would be inconsistent with a UTI source.  Due to continued encephalopathy a MRI was obtained yesterday which showed significant abnormalities.  They note numerous lesions in the brain widely distributed with greater than 50 lesions identified.  This pattern was felt to be consistent with multiple brain abscesses.  Antibiotics were broadened to vancomycin, cefepime, metronidazole.  Status post lumbar puncture today showing hazy appearing CSF with 585 white cells and  neutrophil predominance.  Gram stain is negative with cultures pending.  Glucose was also low and protein high.  Cryptococcal antigen was negative and further labs are also pending including toxoplasma PCR, AFB, and fungal cultures.  Lower respiratory tract cultures obtained 2/20 are positive for MRSA.   Past Medical History:  Diagnosis Date   Anemia    Anxiety    Arthritis    Asthma    Cervical cancer ([place])    Chronic kidney disease    Coronary artery disease    Depression    Diabetes mellitus without complication (HCC)    Dyspnea    Headache    Heart murmur    Hx of adenomatous polyp of colon    Hypertension    Rectovaginal fistula    Seizures (HCC)    Stercoral ulcer of rectum 12/2020   Stroke [facility])     Social History   Tobacco Use   Smoking status: Every Day    Packs/day: 3.00    Years: 38.00    Pack years: 114.00    Types: Cigarettes   Smokeless tobacco: Never  Vaping Use   Vaping Use: Some days  Substance Use Topics   Alcohol use: Not Currently   Drug use: Not Currently    Types: Cocaine, Benzodiazepines, Amphetamines, "Crack" cocaine, Marijuana, Opium, Methylphenidate, Heroin    Comment: recovering addict    Family History  Problem Relation Age of Onset   Hypertension Mother    Hyperlipidemia Mother    Hypertension Brother    Diabetes Maternal Uncle     Allergies  Allergen Reactions   Glimepiride Anaphylaxis    Has sulfa in it per pharmacy   Sulfa Antibiotics Anaphylaxis and Hives   Effexor [Venlafaxine] Hives    Review of Systems  Unable to perform ROS: Intubated   OBJECTIVE:   Blood pressure (!) 135/41, pulse (!) 115, temperature 98.9 F (37.2 C), resp. rate (!) 26, height _0  (1.575 m), weight 82.6 kg, SpO2 95 %. Body mass index is 33.31 kg/m.  Physical Exam Constitutional:      Comments: Ill-appearing woman, lying in bed, intubated, sedated  HENT:     Head: Normocephalic and atraumatic.     Nose: Nose normal.     Mouth/Throat:      Comments: ET tube in place, dentition poor Eyes:     General: No scleral icterus.    Conjunctiva/sclera: Conjunctivae normal.  Cardiovascular:     Rate and Rhythm: Regular rhythm. Tachycardia present.     Heart sounds: No murmur heard. Pulmonary:     Comments: Ventilated breath sounds diminished at the bases.  Symmetric chest rise and fall. Abdominal:     General: There is no distension.     Palpations: Abdomen is soft.     Comments: Status post colostomy  Musculoskeletal:     Cervical back: Normal range of motion and neck supple.     Comments: Left subclavian CVC  Skin:    General: Skin is warm and dry.     Findings: No rash.  Neurological:     Comments: Intubated and sedated     Lab Results: Lab Results  Component Value Date   WBC 22.4 (H) 11/15/2021   HGB 10.0 (L) 11/15/2021   HCT 32.6 (L) 11/15/2021   MCV 91.3 11/15/2021   PLT 211 11/15/2021    Lab Results  Component Value Date   NA 132 (L) 11/15/2021   K 3.9 11/15/2021   CO2 22 11/15/2021   GLUCOSE 131 (H) 11/15/2021   BUN 15 11/15/2021   CREATININE 0.87 11/15/2021   CALCIUM 8.5 (L) 11/15/2021   GFRNONAA >60 11/15/2021   GFRAA >60 02/20/2019    Lab Results  Component Value Date   ALT 51 (H) 11/13/2021   AST 84 (H) 11/13/2021   ALKPHOS 65 11/13/2021   BILITOT 0.2 (L) 11/13/2021    No results found for: CRP  No results found for: ESRSEDRATE  I have reviewed the micro and lab results in Epic.  Imaging: MR BRAIN W WO CONTRAST  Result Date: 11/14/2021 CLINICAL DATA:  Mental status change.  Leukocytosis EXAM: MRI HEAD WITHOUT AND WITH CONTRAST TECHNIQUE: Multiplanar, multiecho pulse sequences of the brain and surrounding structures were obtained without and with intravenous contrast. CONTRAST:  8.13m GADAVIST GADOBUTROL 1 MMOL/ML IV SOLN COMPARISON:  CT head 11/12/2021 FINDINGS: Brain: Numerous small lesions are seen throughout the brain. These are best seen on diffusion-weighted imaging and show  restricted diffusion. Some of the lesions show mild peripheral enhancement. Some of the lesions show mild surrounding edema on FLAIR. There are lesions in both cerebellar hemispheres bilaterally measuring under 1 cm. Numerous lesions are seen throughout both cerebral hemispheres. Most of lesions are near the gray-white junction but there also are lesions in the right thalamus and putamen bilaterally and in the splenium. There is also a linear area of restricted diffusion within the left  lateral ventricle. These lesions are felt to be due to infection and brain abscesses. There is likely ventriculitis on the left. Many of the lesions show susceptibility compatible with small areas of hemorrhage Chronic infarct left frontal lobe. Chronic hemorrhage in the left frontal infarct. Ventricle size normal. No midline shift. Vascular: Normal arterial flow voids Skull and upper cervical spine: No focal lesion. Sinuses/Orbits: Extensive mucosal edema throughout the paranasal sinuses. Air-fluid level right maxillary sinus. Bilateral mastoid effusion. Negative orbit Other: None IMPRESSION: Numerous lesions in the brain. There are lesions in the cerebellar hemispheres and in both cerebral hemispheres widely distributed. Greater than 50 lesions are identified. The lesions have a rounded appearance with restricted diffusion and mild enhancement. Many lesions show evidence of mild associated hemorrhage. Mild edema. Restricted diffusion in the left lateral ventricle likely due to ventriculitis. 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 Differential diagnosis includes metastatic disease and demyelinating disease. Correlate with lumbar puncture results. These results were called by telephone at the time of interpretation on 11/14/2021 at 4:46 pm to provider CHI ELLISON , who verbally acknowledged these results. Electronically Signed   By: Franchot Gallo M.D.   On: 11/14/2021  16:48   DG Abd Portable 1V  Result Date: 11/15/2021 CLINICAL DATA:  Encounter for feeding tube placement EXAM: PORTABLE ABDOMEN - 1 VIEW COMPARISON:  None. FINDINGS: Enteric feeding tube tip overlies the distal stomach. Paucity of bowel gas. IMPRESSION: Enteric tube within the distal stomach. Electronically Signed   By: Macy Mis M.D.   On: 11/15/2021 11:13     Imaging independently reviewed in Epic.  [facility] [phone] pager 11/15/2021, 3:26 PM

## 2021-11-15 NOTE — Progress Notes (Signed)
Pharmacy Antibiotic Note  Kristen Murphy is a 56 y.o. female admitted s/p PEA arrest on 11/12/2021 with pneumonia r/o meningitis. Patient initially started on broad spectrum antibiotics: vancomycin, ceftriaxone, metronidazole and acyclovir.  S/p MRI - acyclovir stopped,  UCX with pseudomonas and enterobacter> ceftriaxone stopped and changed to cefepime. Tm 102 > improved 99 wbc 30 > improved 22, Cr stable 1.  Continue vancomycin with staph in BAL  VT drawn this am 23 slightly higher than goal 15-20  Plan: Decrease Vancomycin 500 mg IV q12h  Will recheck VT at steady state  Cefepime 2gm IV q8h  Metronidazole 548m IV q8h  Height: _0  (157.5 cm) Weight: 82.6 kg (182 lb 1.6 oz) IBW/kg (Calculated) : 50.1  Temp (24hrs), Avg:98.4 F (36.9 C), Min:97 F (36.1 C), Max:100 F (37.8 C)  Recent Labs  Lab 11/12/21 1508 11/12/21 1514 11/12/21 1518 11/12/21 1931 11/12/21 2233 11/13/21 0228 11/14/21 0315 11/14/21 0824 11/14/21 1400 11/15/21 0449 11/15/21 0732  WBC 18.6*  --   --   --   --  30.2* 26.7*  --   --  22.4*  --   CREATININE 1.26* 1.10*  --   --   --  1.10* 1.06*  --  0.98 0.87  --   LATICACIDVEN  --   --  1.5 2.5* 1.9  --   --  1.2  --   --   --   VANCOTROUGH  --   --   --   --   --   --   --   --   --   --  23*     Estimated Creatinine Clearance: 71.9 mL/min (by C-G formula based on SCr of 0.87 mg/dL).    Allergies  Allergen Reactions   Glimepiride Anaphylaxis    Has sulfa in it per pharmacy   Sulfa Antibiotics Anaphylaxis and Hives   Effexor [Venlafaxine] Hives    Antimicrobials this admission: Ceftriaxone 2/19 >> 2/21 Vancomycin 2/19 >>  Acyclovir 2/19 >> 2/21 Metronidazole 2/19> Cefepime 2/21> Dose adjustments this admission: Vancomycin [id number] with VT 23 Decreased vanc [id number]  Microbiology results: 2/19 BCx: ngtd 2/20 BALCx > staph aureus 2/19 MRSA PCR > detected 2/19 Ucx enterobacter R-cefazolin, pseudomonas - pan sensitive    LisBonnita Nasutiarm.D. CPP, BCPS Clinical Pharmacist [id number] 11:04 AM

## 2021-11-16 ENCOUNTER — Inpatient Hospital Stay (HOSPITAL_COMMUNITY): Payer: Medicaid Other

## 2021-11-16 DIAGNOSIS — C539 Malignant neoplasm of cervix uteri, unspecified: Secondary | ICD-10-CM | POA: Diagnosis not present

## 2021-11-16 DIAGNOSIS — G060 Intracranial abscess and granuloma: Secondary | ICD-10-CM | POA: Diagnosis not present

## 2021-11-16 DIAGNOSIS — A419 Sepsis, unspecified organism: Secondary | ICD-10-CM | POA: Diagnosis not present

## 2021-11-16 DIAGNOSIS — I469 Cardiac arrest, cause unspecified: Secondary | ICD-10-CM | POA: Diagnosis not present

## 2021-11-16 DIAGNOSIS — R569 Unspecified convulsions: Secondary | ICD-10-CM | POA: Diagnosis not present

## 2021-11-16 LAB — HEPATIC FUNCTION PANEL
ALT: 25 U/L (ref 0–44)
AST: 19 U/L (ref 15–41)
Albumin: <1.5 g/dL — ABNORMAL LOW (ref 3.5–5.0)
Alkaline Phosphatase: 202 U/L — ABNORMAL HIGH (ref 38–126)
Bilirubin, Direct: 0.2 mg/dL (ref 0.0–0.2)
Indirect Bilirubin: 0.2 mg/dL — ABNORMAL LOW (ref 0.3–0.9)
Total Bilirubin: 0.4 mg/dL (ref 0.3–1.2)
Total Protein: 5.4 g/dL — ABNORMAL LOW (ref 6.5–8.1)

## 2021-11-16 LAB — CBC
HCT: 30.8 % — ABNORMAL LOW (ref 36.0–46.0)
Hemoglobin: 9.2 g/dL — ABNORMAL LOW (ref 12.0–15.0)
MCH: 27.5 pg (ref 26.0–34.0)
MCHC: 29.9 g/dL — ABNORMAL LOW (ref 30.0–36.0)
MCV: 92.2 fL (ref 80.0–100.0)
Platelets: 226 10*3/uL (ref 150–400)
RBC: 3.34 MIL/uL — ABNORMAL LOW (ref 3.87–5.11)
RDW: 16.7 % — ABNORMAL HIGH (ref 11.5–15.5)
WBC: 17.6 10*3/uL — ABNORMAL HIGH (ref 4.0–10.5)
nRBC: 0.4 % — ABNORMAL HIGH (ref 0.0–0.2)

## 2021-11-16 LAB — POCT I-STAT 7, (LYTES, BLD GAS, ICA,H+H)
Acid-base deficit: 3 mmol/L — ABNORMAL HIGH (ref 0.0–2.0)
Acid-base deficit: 1 mmol/L (ref 0.0–2.0)
Bicarbonate: 24.8 mmol/L (ref 20.0–28.0)
Bicarbonate: 26.6 mmol/L (ref 20.0–28.0)
Calcium, Ion: 1.35 mmol/L (ref 1.15–1.40)
Calcium, Ion: 1.35 mmol/L (ref 1.15–1.40)
HCT: 38 % (ref 36.0–46.0)
HCT: 31 % — ABNORMAL LOW (ref 36.0–46.0)
Hemoglobin: 12.9 g/dL (ref 12.0–15.0)
Hemoglobin: 10.5 g/dL — ABNORMAL LOW (ref 12.0–15.0)
O2 Saturation: 88 %
O2 Saturation: 92 %
Patient temperature: 98
Patient temperature: 98
Potassium: 3.7 mmol/L (ref 3.5–5.1)
Potassium: 3.5 mmol/L (ref 3.5–5.1)
Sodium: 136 mmol/L (ref 135–145)
Sodium: 134 mmol/L — ABNORMAL LOW (ref 135–145)
TCO2: 26 mmol/L (ref 22–32)
TCO2: 28 mmol/L (ref 22–32)
pCO2 arterial: 54 mmHg — ABNORMAL HIGH (ref 32–48)
pCO2 arterial: 55.3 mmHg — ABNORMAL HIGH (ref 32–48)
pH, Arterial: 7.269 — ABNORMAL LOW (ref 7.35–7.45)
pH, Arterial: 7.289 — ABNORMAL LOW (ref 7.35–7.45)
pO2, Arterial: 62 mmHg — ABNORMAL LOW (ref 83–108)
pO2, Arterial: 73 mmHg — ABNORMAL LOW (ref 83–108)

## 2021-11-16 LAB — GLUCOSE, CAPILLARY
Glucose-Capillary: 142 mg/dL — ABNORMAL HIGH (ref 70–99)
Glucose-Capillary: 101 mg/dL — ABNORMAL HIGH (ref 70–99)
Glucose-Capillary: 159 mg/dL — ABNORMAL HIGH (ref 70–99)
Glucose-Capillary: 97 mg/dL (ref 70–99)
Glucose-Capillary: 109 mg/dL — ABNORMAL HIGH (ref 70–99)

## 2021-11-16 LAB — BASIC METABOLIC PANEL
Anion gap: 9 (ref 5–15)
Anion gap: 9 (ref 5–15)
BUN: 15 mg/dL (ref 6–20)
BUN: 17 mg/dL (ref 6–20)
CO2: 23 mmol/L (ref 22–32)
CO2: 24 mmol/L (ref 22–32)
Calcium: 8.7 mg/dL — ABNORMAL LOW (ref 8.9–10.3)
Calcium: 8.9 mg/dL (ref 8.9–10.3)
Chloride: 102 mmol/L (ref 98–111)
Chloride: 101 mmol/L (ref 98–111)
Creatinine, Ser: 0.87 mg/dL (ref 0.44–1.00)
Creatinine, Ser: 0.84 mg/dL (ref 0.44–1.00)
GFR, Estimated: >60 mL/min (ref >60)
GFR, Estimated: >60 mL/min (ref >60)
Glucose, Bld: 139 mg/dL — ABNORMAL HIGH (ref 70–99)
Glucose, Bld: 128 mg/dL — ABNORMAL HIGH (ref 70–99)
Potassium: 3.4 mmol/L — ABNORMAL LOW (ref 3.5–5.1)
Potassium: 3.3 mmol/L — ABNORMAL LOW (ref 3.5–5.1)
Sodium: 134 mmol/L — ABNORMAL LOW (ref 135–145)
Sodium: 134 mmol/L — ABNORMAL LOW (ref 135–145)

## 2021-11-16 LAB — TRIGLYCERIDES: Triglycerides: 447 mg/dL — ABNORMAL HIGH (ref <150)

## 2021-11-16 LAB — LD, BODY FLUID (OTHER): LD, Body Fluid: 680 IU/L

## 2021-11-16 LAB — ACID FAST SMEAR (AFB, MYCOBACTERIA): Acid Fast Smear: NEGATIVE

## 2021-11-16 LAB — TROPONIN I (HIGH SENSITIVITY)
Troponin I (High Sensitivity): 33 ng/L — ABNORMAL HIGH (ref <18)
Troponin I (High Sensitivity): 18 ng/L — ABNORMAL HIGH (ref <18)

## 2021-11-16 LAB — CYTOLOGY - NON PAP

## 2021-11-16 MED ORDER — POLYETHYLENE GLYCOL 3350 17 G PO PACK
17.0000 g | PACK | Freq: Every day | ORAL | Status: DC
  Administered 2021-11-16 – 2021-12-04 (×16): 17 g
  Filled 2021-11-16 (×17): qty 1

## 2021-11-16 MED ORDER — BUSPIRONE HCL 10 MG PO TABS: 30.0000 mg | ORAL_TABLET | Freq: Three times a day (TID) | ORAL | Status: AC | PRN

## 2021-11-16 MED ORDER — DOCUSATE SODIUM 50 MG/5ML PO LIQD
100.0000 mg | Freq: Two times a day (BID) | ORAL | Status: DC
  Administered 2021-11-16 – 2021-12-05 (×32): 100 mg
  Filled 2021-11-16 (×33): qty 10

## 2021-11-16 MED ORDER — MIDAZOLAM HCL 2 MG/2ML IJ SOLN
2.0000 mg | INTRAMUSCULAR | Status: DC | PRN
  Administered 2021-11-16: 2 mg via INTRAVENOUS
  Filled 2021-11-16: qty 2

## 2021-11-16 MED ORDER — ACETAMINOPHEN 650 MG RE SUPP: 650.0000 mg | RECTAL | Status: DC | PRN

## 2021-11-16 MED ORDER — FUROSEMIDE 10 MG/ML IJ SOLN
40.0000 mg | Freq: Once | INTRAMUSCULAR | Status: AC
Start: 2021-11-16 — End: 2021-11-16
  Administered 2021-11-16: 40 mg via INTRAVENOUS
  Filled 2021-11-16: qty 4

## 2021-11-16 MED ORDER — HEPARIN SODIUM (PORCINE) 5000 UNIT/ML IJ SOLN
5000.0000 [IU] | Freq: Three times a day (TID) | INTRAMUSCULAR | Status: DC
  Administered 2021-11-16 – 2021-11-21 (×17): 5000 [IU] via SUBCUTANEOUS
  Filled 2021-11-16 (×17): qty 1

## 2021-11-16 MED ORDER — ACETAMINOPHEN 325 MG PO TABS
650.0000 mg | ORAL_TABLET | ORAL | Status: DC | PRN
  Administered 2021-11-24: 650 mg via ORAL
  Filled 2021-11-16: qty 2

## 2021-11-16 MED ORDER — ACETAMINOPHEN 160 MG/5ML PO SOLN
650.0000 mg | ORAL | Status: AC
  Administered 2021-11-16 – 2021-11-17 (×10): 650 mg
  Filled 2021-11-16 (×10): qty 20.3

## 2021-11-16 MED ORDER — ACETAMINOPHEN 650 MG RE SUPP: 650.0000 mg | RECTAL | Status: AC

## 2021-11-16 MED ORDER — MIDAZOLAM HCL 2 MG/2ML IJ SOLN
2.0000 mg | Freq: Once | INTRAMUSCULAR | Status: AC
  Administered 2021-11-16: 2 mg via INTRAVENOUS
  Filled 2021-11-16: qty 2

## 2021-11-16 MED ORDER — ACETAMINOPHEN 325 MG PO TABS: 650.0000 mg | ORAL_TABLET | ORAL | Status: AC

## 2021-11-16 MED ORDER — MAGNESIUM SULFATE 2 GM/50ML IV SOLN: 2.0000 g | Freq: Once | INTRAVENOUS | Status: AC | PRN

## 2021-11-16 MED ORDER — ACETAMINOPHEN 160 MG/5ML PO SOLN
650.0000 mg | ORAL | Status: DC | PRN
  Administered 2021-11-19 – 2021-12-10 (×14): 650 mg
  Filled 2021-11-16 (×14): qty 20.3

## 2021-11-16 MED ORDER — POTASSIUM CHLORIDE 10 MEQ/50ML IV SOLN
10.0000 meq | INTRAVENOUS | Status: AC
  Administered 2021-11-16 (×4): 10 meq via INTRAVENOUS
  Filled 2021-11-16 (×4): qty 50

## 2021-11-16 NOTE — Progress Notes (Signed)
[facility]  Date of Admission:  11/12/2021           Reason for visit: Follow up on brain abscess, cavitary pneumonia  Current antibiotics: Linezolid Cefepime Metronidazole   ASSESSMENT:    56 y.o. female admitted with:  Multiple brain abscesses MRSA pneumonia Possible liver lesion Sepsis Acute hypoxemic respiratory failure PEA arrest with ROSC Diabetes History of cervical cancer History of VRE UTI  Patient presenting with fever and encephalopathy concerning for CNS infection.  Thus far work-up has revealed MRSA pneumonia and MRI brain with multiple abscesses and CSF studies consistent with bacterial meningitis.  Multiple studies remain pending.  Blood cultures are negative at this time but would consider endocarditis and embolic disease.  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  RECOMMENDATIONS:    Continue current antibiotics Await TEE and MRI liver Follow-up CSF studies Will follow   Principal Problem:   Brain abscess Active Problems:   Cervical cancer ([place])   Diabetes mellitus without complication ([place])   Cardiac arrest ([place])   Respiratory failure ([facility])   Severe sepsis with septic shock ([facility])   MRSA pneumonia ([place])    MEDICATIONS:    Scheduled Meds:  acetaminophen  650 mg Oral Q4H   Or   acetaminophen (TYLENOL) oral liquid 160 mg/5 mL  650 mg Per Tube Q4H   Or   acetaminophen  650 mg Rectal Q4H   chlorhexidine gluconate (MEDLINE KIT)  15 mL Mouth Rinse BID   Chlorhexidine Gluconate Cloth  6 each Topical Daily   docusate  100 mg Per Tube BID   fentaNYL (SUBLIMAZE) injection  50 mcg Intravenous Once   heparin injection (subcutaneous)  5,000 Units Subcutaneous Q8H   insulin aspart  0-9 Units Subcutaneous Q4H   insulin aspart  3 Units Subcutaneous Q4H   mouth rinse  15 mL Mouth Rinse 10 times per day   pantoprazole  40 mg Intravenous Q12H   polyethylene  glycol  17 g Per Tube Daily   sodium chloride flush  3 mL Intravenous Once   Continuous Infusions:  sodium chloride     sodium chloride 5 mL/hr at 11/15/21 0803   sodium chloride     ceFEPime (MAXIPIME) IV 200 mL/hr at 11/16/21 1000   feeding supplement (VITAL AF 1.2 CAL)     fentaNYL infusion INTRAVENOUS 100 mcg/hr (11/16/21 1000)   levETIRAcetam 400 mL/hr at 11/16/21 1000   linezolid (ZYVOX) IV 300 mL/hr at 11/16/21 1000   magnesium sulfate     metronidazole 500 mg (11/16/21 0838)   norepinephrine (LEVOPHED) Adult infusion Stopped (11/15/21 1134)   propofol (DIPRIVAN) infusion 30 mcg/kg/min (11/16/21 1000)   PRN Meds:.Place/Maintain arterial line **AND** sodium chloride, sodium chloride, [START ON 11/18/2021] acetaminophen **OR** [START ON 11/18/2021] acetaminophen (TYLENOL) oral liquid 160 mg/5 mL **OR** [START ON 11/18/2021] acetaminophen, albuterol, busPIRone **OR** busPIRone, fentaNYL, magnesium sulfate, midazolam, polyethylene glycol  SUBJECTIVE:   24 hour events:  Fever curve improving WBC improving No updated culture results Liver MRI pending Multiple CSF studies pending  Patient remains intubated, sedated, obtunded on the ventilator  Review of Systems  Unable to perform ROS: Intubated     OBJECTIVE:   Blood pressure (!) 127/50, pulse (!) 116, temperature 98.9 F (37.2 C), temperature source Bladder, resp. rate (!) 30, height '5\' 2"'  (1.575 m), weight 83 kg, SpO2 91 %. Body mass index is 33.47 kg/m.  Physical Exam Constitutional:  Comments: Patient is obtunded, sedated, intubated.  Not responsive.  HENT:     Head: Normocephalic and atraumatic.     Mouth/Throat:     Comments: ET tube in place. Eyes:     Comments: Pupils are reactive but sluggish.  Cardiovascular:     Rate and Rhythm: Regular rhythm. Tachycardia present.  Pulmonary:     Comments: Ventilated breath sounds are diminished at the bases.  Symmetric chest rise and fall. Abdominal:     General:  There is no distension.     Palpations: Abdomen is soft.     Tenderness: There is no abdominal tenderness.  Musculoskeletal:     Comments: Left subclavian CVC in place  Skin:    General: Skin is warm and dry.     Findings: No rash.  Neurological:     Comments: Sedated     Lab Results: Lab Results  Component Value Date   WBC 17.6 (H) 11/16/2021   HGB 12.9 11/16/2021   HCT 38.0 11/16/2021   MCV 92.2 11/16/2021   PLT 226 11/16/2021    Lab Results  Component Value Date   NA 136 11/16/2021   K 3.7 11/16/2021   CO2 23 11/16/2021   GLUCOSE 139 (H) 11/16/2021   BUN 15 11/16/2021   CREATININE 0.87 11/16/2021   CALCIUM 8.7 (L) 11/16/2021   GFRNONAA >60 11/16/2021   GFRAA >60 02/20/2019    Lab Results  Component Value Date   ALT 25 11/16/2021   AST 19 11/16/2021   ALKPHOS 202 (H) 11/16/2021   BILITOT 0.4 11/16/2021    No results found for: CRP  No results found for: ESRSEDRATE   I have reviewed the micro and lab results in Epic.  Imaging: MR BRAIN W WO CONTRAST  Result Date: 11/14/2021 CLINICAL DATA:  Mental status change.  Leukocytosis EXAM: MRI HEAD WITHOUT AND WITH CONTRAST TECHNIQUE: Multiplanar, multiecho pulse sequences of the brain and surrounding structures were obtained without and with intravenous contrast. CONTRAST:  8.30m GADAVIST GADOBUTROL 1 MMOL/ML IV SOLN COMPARISON:  CT head 11/12/2021 FINDINGS: Brain: Numerous small lesions are seen throughout the brain. These are best seen on diffusion-weighted imaging and show restricted diffusion. Some of the lesions show mild peripheral enhancement. Some of the lesions show mild surrounding edema on FLAIR. There are lesions in both cerebellar hemispheres bilaterally measuring under 1 cm. Numerous lesions are seen throughout both cerebral hemispheres. Most of lesions are near the gray-white junction but there also are lesions in the right thalamus and putamen bilaterally and in the splenium. There is also a linear area of  restricted diffusion within the left lateral ventricle. These lesions are felt to be due to infection and brain abscesses. There is likely ventriculitis on the left. Many of the lesions show susceptibility compatible with small areas of hemorrhage Chronic infarct left frontal lobe. Chronic hemorrhage in the left frontal infarct. Ventricle size normal. No midline shift. Vascular: Normal arterial flow voids Skull and upper cervical spine: No focal lesion. Sinuses/Orbits: Extensive mucosal edema throughout the paranasal sinuses. Air-fluid level right maxillary sinus. Bilateral mastoid effusion. Negative orbit Other: None IMPRESSION: Numerous lesions in the brain. There are lesions in the cerebellar hemispheres and in both cerebral hemispheres widely distributed. Greater than 50 lesions are identified. The lesions have a rounded appearance with restricted diffusion and mild enhancement. Many lesions show evidence of mild associated hemorrhage. Mild edema. Restricted diffusion in the left lateral ventricle likely due to ventriculitis. The pattern is most likely due to multiple  brain abscesses particularly given the white count of 27. Differential diagnosis includes metastatic disease and demyelinating disease. Correlate with lumbar puncture results. These results were called by telephone at the time of interpretation on 11/14/2021 at 4:46 pm to provider CHI ELLISON , who verbally acknowledged these results. Electronically Signed   By: Franchot Gallo M.D.   On: 11/14/2021 16:48   DG CHEST PORT 1 VIEW  Result Date: 11/16/2021 CLINICAL DATA:  Brain abscess, altered mental status, hypoxemia, MRSA, cervical cancer, asthma, hypertension EXAM: PORTABLE CHEST 1 VIEW COMPARISON:  Portable exam 0728 hours compared to 11/13/2021 FINDINGS: Tip of endotracheal tube projects 4.8 cm above carina. Feeding tube extends into stomach. LEFT subclavian central venous catheter tip projects over SVC. Normal heart size and mediastinal  contours. Atherosclerotic calcifications aorta. Small LEFT pleural effusion and mild LEFT basilar atelectasis. Remaining lungs clear. No pneumothorax. IMPRESSION: Small LEFT pleural effusion and LEFT basilar atelectasis. Aortic Atherosclerosis (ICD10-I70.0). Electronically Signed   By: Lavonia Dana M.D.   On: 11/16/2021 08:28   DG Abd Portable 1V  Result Date: 11/15/2021 CLINICAL DATA:  Encounter for feeding tube placement EXAM: PORTABLE ABDOMEN - 1 VIEW COMPARISON:  None. FINDINGS: Enteric feeding tube tip overlies the distal stomach. Paucity of bowel gas. IMPRESSION: Enteric tube within the distal stomach. Electronically Signed   By: Macy Mis M.D.   On: 11/15/2021 11:13     Imaging independently reviewed in Epic.    [facility] [phone] pager 11/16/2021, 11:21 AM

## 2021-11-16 NOTE — Progress Notes (Addendum)
Subjective: Patient is lying in bed on vent in NAD on 100 mcg Fentanyl and 40 mcg propofol obtunded and nonresponsive. Also had 2 mg of versed prior to assessment. Patient continued to have episodes of dyssynchrony with the ventilator requiring up titration of sedation overnight. No new neurological issues or seizure like activity overnight per RN. No family at bedside. ICU RN did report that patient had some BUE movement this AM but has not moved since then.  Objective: Current vital signs: BP (!) 125/54    Pulse (!) 102    Temp 98.7 F (37.1 C)    Resp (!) 32    Ht 5' 2" (1.575 m)    Wt 83 kg    SpO2 91%    BMI 33.47 kg/m  Vital signs in last 24 hours: Temp:  [98.3 F (36.8 C)-98.9 F (37.2 C)] 98.7 F (37.1 C) (02/23 0649) Pulse Rate:  [95-119] 102 (02/23 0735) Resp:  [14-32] 32 (02/23 0735) BP: (120-151)/(41-64) 125/54 (02/23 0735) SpO2:  [91 %-100 %] 91 % (02/23 0735) Arterial Line BP: (93-176)/(42-90) 139/52 (02/23 0643) FiO2 (%):  [80 %-90 %] 80 % (02/23 0735) Weight:  [83 kg] 83 kg (02/23 0500)  Intake/Output from previous day: 02/22 0701 - 02/23 0700 In: 1550.3 [I.V.:535.6; IV Piggyback:1014.7] Out: 1062 [Urine:1850] Intake/Output this shift: No intake/output data recorded. Nutritional status:  Diet Order             Diet NPO time specified  Diet effective midnight                   Neurologic Exam: Ment: Patient under heavy sedation (propofol 40, fentanyl 100, just received 2 mg versed) has eyes closed, does not open eyes, is obtunded and non responsive to noxious stimuli. Patient does not follow commands.  CN: Pupils are pinpoint with upward, disconjugate gaze. No corneal reflex noted, no blink to threat, negative dolls eyes. No cough or gag. Is breathing over ventilator.  Motor/Sensory: All 4 extremities are are flaccid with no movement to noxious stimuli (on heavy sedation)  Lab Results: Results for orders placed or performed during the hospital encounter of  11/12/21 (from the past 48 hour(s))  Lactic acid, plasma     Status: None   Collection Time: 11/14/21  8:24 AM  Result Value Ref Range   Lactic Acid, Venous 1.2 0.5 - 1.9 mmol/L    Comment: Performed at [facility], [address] [place], [place] [address]  Magnesium     Status: None   Collection Time: 11/14/21  8:24 AM  Result Value Ref Range   Magnesium 1.7 1.7 - 2.4 mg/dL    Comment: Performed at [facility], [place] [address], [place], [place] [address]  Phosphorus     Status: None   Collection Time: 11/14/21  8:24 AM  Result Value Ref Range   Phosphorus 3.7 2.5 - 4.6 mg/dL    Comment: Performed at [facility] [address], [place], [place] [address]  I-STAT 7, (LYTES, BLD GAS, ICA, H+H)     Status: Abnormal   Collection Time: 11/14/21  8:26 AM  Result Value Ref Range   pH, Arterial 7.244 (L) 7.35 - 7.45   pCO2 arterial 62.0 (H) 32 - 48 mmHg   pO2, Arterial 68 (L) 83 - 108 mmHg   Bicarbonate 26.7 20.0 - 28.0 mmol/L   TCO2 29 22 - 32 mmol/L   O2 Saturation 89 %   Acid-base deficit  1.0 0.0 - 2.0 mmol/L  ° Sodium 131 (L) 135 - 145 mmol/L  ° Potassium 3.9 3.5 - 5.1 mmol/L  ° Calcium, Ion 1.16 1.15 - 1.40 mmol/L  ° HCT 33.0 (L) 36.0 - 46.0 %  ° Hemoglobin 11.2 (L) 12.0 - 15.0 g/dL  ° Patient temperature 99.2 F   ° Sample type ARTERIAL   °Glucose, capillary     Status: Abnormal  ° Collection Time: 11/14/21 11:10 AM  °Result Value Ref Range  ° Glucose-Capillary 108 (H) 70 - 99 mg/dL  °  Comment: Glucose reference range applies only to samples taken after fasting for at least 8 hours.  °Basic metabolic panel     Status: Abnormal  ° Collection Time: 11/14/21  2:00 PM  °Result Value Ref Range  ° Sodium 132 (L) 135 - 145 mmol/L  ° Potassium 3.9 3.5 - 5.1 mmol/L  ° Chloride 99 98 - 111 mmol/L  ° CO2 22 22 - 32 mmol/L  ° Glucose, Bld 120 (H) 70 - 99 mg/dL  °  Comment: Glucose reference range applies only to samples taken after fasting for at least 8 hours.  ° BUN 13 6 - 20  mg/dL  ° Creatinine, Ser 0.98 0.44 - 1.00 mg/dL  ° Calcium 7.7 (L) 8.9 - 10.3 mg/dL  ° GFR, Estimated >60 >60 mL/min  °  Comment: (NOTE) °Calculated using the CKD-EPI Creatinine Equation (2021) °  ° Anion gap 11 5 - 15  °  Comment: Performed at [facility], [address] [place], [place] [address]  °Magnesium     Status: Abnormal  ° Collection Time: 11/14/21  5:00 PM  °Result Value Ref Range  ° Magnesium 1.6 (L) 1.7 - 2.4 mg/dL  °  Comment: Performed at [facility], [address] [place], [place] [address]  °Phosphorus     Status: None  ° Collection Time: 11/14/21  5:00 PM  °Result Value Ref Range  ° Phosphorus 2.8 2.5 - 4.6 mg/dL  °  Comment: Performed at [facility], [address] [place], [place] [address]  °Glucose, capillary     Status: Abnormal  ° Collection Time: 11/14/21  5:37 PM  °Result Value Ref Range  ° Glucose-Capillary 134 (H) 70 - 99 mg/dL  °  Comment: Glucose reference range applies only to samples taken after fasting for at least 8 hours.  °I-STAT 7, (LYTES, BLD GAS, ICA, H+H)     Status: Abnormal  ° Collection Time: 11/14/21  6:38 PM  °Result Value Ref Range  ° pH, Arterial 7.255 (L) 7.35 - 7.45  ° pCO2 arterial 58.2 (H) 32 - 48 mmHg  ° pO2, Arterial 86 83 - 108 mmHg  ° Bicarbonate 26.0 20.0 - 28.0 mmol/L  ° TCO2 28 22 - 32 mmol/L  ° O2 Saturation 95 %  ° Acid-base deficit 2.0 0.0 - 2.0 mmol/L  ° Sodium 132 (L) 135 - 145 mmol/L  ° Potassium 3.7 3.5 - 5.1 mmol/L  ° Calcium, Ion 1.20 1.15 - 1.40 mmol/L  ° HCT 31.0 (L) 36.0 - 46.0 %  ° Hemoglobin 10.5 (L) 12.0 - 15.0 g/dL  ° Patient temperature 97.6 F   ° Sample type ARTERIAL   °Glucose, capillary     Status: Abnormal  ° Collection Time: 11/14/21  8:23 PM  °Result Value Ref Range  ° Glucose-Capillary 116 (H) 70 - 99 mg/dL  °  Comment: Glucose reference range applies only to samples taken after fasting for   at least 8 hours.  Glucose, capillary     Status: Abnormal   Collection Time: 11/14/21 11:38 PM  Result Value Ref  Range   Glucose-Capillary 118 (H) 70 - 99 mg/dL    Comment: Glucose reference range applies only to samples taken after fasting for at least 8 hours.  Glucose, capillary     Status: Abnormal   Collection Time: 11/15/21  4:44 AM  Result Value Ref Range   Glucose-Capillary 130 (H) 70 - 99 mg/dL    Comment: Glucose reference range applies only to samples taken after fasting for at least 8 hours.  I-STAT 7, (LYTES, BLD GAS, ICA, H+H)     Status: Abnormal   Collection Time: 11/15/21  4:47 AM  Result Value Ref Range   pH, Arterial 7.251 (L) 7.35 - 7.45   pCO2 arterial 53.4 (H) 32 - 48 mmHg   pO2, Arterial 67 (L) 83 - 108 mmHg   Bicarbonate 23.5 20.0 - 28.0 mmol/L   TCO2 25 22 - 32 mmol/L   O2 Saturation 89 %   Acid-base deficit 4.0 (H) 0.0 - 2.0 mmol/L   Sodium 133 (L) 135 - 145 mmol/L   Potassium 4.0 3.5 - 5.1 mmol/L   Calcium, Ion 1.26 1.15 - 1.40 mmol/L   HCT 31.0 (L) 36.0 - 46.0 %   Hemoglobin 10.5 (L) 12.0 - 15.0 g/dL   Patient temperature 98.5 F    Sample type ARTERIAL   CBC     Status: Abnormal   Collection Time: 11/15/21  4:49 AM  Result Value Ref Range   WBC 22.4 (H) 4.0 - 10.5 K/uL   RBC 3.57 (L) 3.87 - 5.11 MIL/uL   Hemoglobin 10.0 (L) 12.0 - 15.0 g/dL   HCT 32.6 (L) 36.0 - 46.0 %   MCV 91.3 80.0 - 100.0 fL   MCH 28.0 26.0 - 34.0 pg   MCHC 30.7 30.0 - 36.0 g/dL   RDW 16.1 (H) 11.5 - 15.5 %   Platelets 211 150 - 400 K/uL   nRBC 0.3 (H) 0.0 - 0.2 %    Comment: Performed at [facility], [address] [place], [place] [address]  Basic metabolic panel     Status: Abnormal   Collection Time: 11/15/21  4:49 AM  Result Value Ref Range   Sodium 132 (L) 135 - 145 mmol/L   Potassium 3.9 3.5 - 5.1 mmol/L   Chloride 99 98 - 111 mmol/L   CO2 22 22 - 32 mmol/L   Glucose, Bld 131 (H) 70 - 99 mg/dL    Comment: Glucose reference range applies only to samples taken after fasting for at least 8 hours.   BUN 15 6 - 20 mg/dL   Creatinine, Ser 0.87 0.44 - 1.00 mg/dL   Calcium  8.5 (L) 8.9 - 10.3 mg/dL   GFR, Estimated >60 >60 mL/min    Comment: (NOTE) Calculated using the CKD-EPI Creatinine Equation (2021)    Anion gap 11 5 - 15    Comment: Performed at [facility] [address], [place], [place] [address]  Magnesium     Status: None   Collection Time: 11/15/21  4:49 AM  Result Value Ref Range   Magnesium 2.0 1.7 - 2.4 mg/dL    Comment: Performed at [facility] [address], [place], [place] [address]  Phosphorus     Status: None   Collection Time: 11/15/21  4:49 AM  Result Value Ref Range   Phosphorus  2.7 2.5 - 4.6 mg/dL  °  Comment: Performed at [facility], [address] [place], [place] [address]  °Vancomycin, trough     Status: Abnormal  ° Collection Time: 11/15/21  7:32 AM  °Result Value Ref Range  ° Vancomycin Tr 23 (HH) 15 - 20 ug/mL  °  Comment: CRITICAL RESULT CALLED TO, READ BACK BY AND VERIFIED WITH: °C ZEILMAN RN AT 0817 ON 11/15/21 BY RIVETM °Performed at [facility], [address] [place], [place] [address] °  °CSF culture w Stat Gram Stain     Status: None (Preliminary result)  ° Collection Time: 11/15/21  8:02 AM  ° Specimen: CSF; Cerebrospinal Fluid  °Result Value Ref Range  ° Specimen Description CSF   ° Special Requests NONE   ° Gram Stain    °  WBC PRESENT,BOTH PMN AND MONONUCLEAR °NO ORGANISMS SEEN °CYTOSPIN SMEAR °Performed at [facility], [address] [place], [place] [address] °  ° Culture PENDING   ° Report Status PENDING   °CSF cell count with differential     Status: Abnormal  ° Collection Time: 11/15/21  8:20 AM  °Result Value Ref Range  ° Tube # 3   ° Color, CSF COLORLESS COLORLESS  ° Appearance, CSF HAZY (A) CLEAR  ° Supernatant NOT INDICATED   ° RBC Count, CSF 14 (H) 0 /cu mm  ° WBC, CSF 585 (HH) 0 - 5 /cu mm  °  Comment: CRITICAL RESULT CALLED TO, READ BACK BY AND VERIFIED WITH: °C.ZERLMAN RN 11/15/2021 1148 DAVISB °  ° Segmented Neutrophils-CSF 84 (H) 0 - 6 %  ° Lymphs, CSF 10 (L) 40 - 80 %  °  Monocyte-Macrophage-Spinal Fluid 6 (L) 15 - 45 %  ° Eosinophils, CSF 0 0 - 1 %  °  Comment: Performed at [facility], [address] [place], [place] [address]  °Protein and glucose, CSF     Status: Abnormal  ° Collection Time: 11/15/21  8:20 AM  °Result Value Ref Range  ° Glucose, CSF 21 (LL) 40 - 70 mg/dL  °  Comment: CRITICAL RESULT CALLED TO, READ BACK BY AND VERIFIED WITH: °CASEY ZERLMAN, RN 1054 11/15/21 L. KLAR °  ° Total  Protein, CSF 191 (H) 15 - 45 mg/dL  °  Comment: RESULTS CONFIRMED BY MANUAL DILUTION °Performed at [facility], [address] [place], [place] [address] °  °Cryptococcal antigen, CSF     Status: None  ° Collection Time: 11/15/21  8:20 AM  °Result Value Ref Range  ° Crypto Ag NEGATIVE NEGATIVE  ° Cryptococcal Ag Titer NOT INDICATED NOT INDICATED  °  Comment: Performed at [facility], [address] , [place] [address]  °Glucose, capillary     Status: Abnormal  ° Collection Time: 11/15/21  9:09 AM  °Result Value Ref Range  ° Glucose-Capillary 130 (H) 70 - 99 mg/dL  °  Comment: Glucose reference range applies only to samples taken after fasting for at least 8 hours.  °Acid Fast Smear (AFB)     Status: None  ° Collection Time: 11/15/21  9:23 AM  ° Specimen: CSF; Cerebrospinal Fluid  °Result Value Ref Range  ° AFB Specimen Processing Direct Inoculation   ° Acid Fast Smear Negative   °  Comment: (NOTE) °Performed At: [facility] °[address] [place], [place] [id number] °Nagendra Sanjai MD Ph:[phone] °  ° Source (AFB) CSF   °    Comment: Performed at [facility], [address] [place], [place] [address]  °Glucose, capillary     Status: Abnormal  ° Collection Time: 11/15/21 11:14 AM  °Result Value Ref Range  ° Glucose-Capillary 123 (H) 70 - 99 mg/dL  °  Comment: Glucose reference range applies only to samples taken after fasting for at least 8 hours.  °Glucose, capillary     Status: Abnormal  ° Collection Time: 11/15/21  4:31 PM  °Result Value  Ref Range  ° Glucose-Capillary 123 (H) 70 - 99 mg/dL  °  Comment: Glucose reference range applies only to samples taken after fasting for at least 8 hours.  °Magnesium     Status: None  ° Collection Time: 11/15/21  5:00 PM  °Result Value Ref Range  ° Magnesium 1.9 1.7 - 2.4 mg/dL  °  Comment: Performed at [facility], [address] [place], [place] [address]  °Phosphorus     Status: Abnormal  ° Collection Time: 11/15/21  5:00 PM  °Result Value Ref Range  ° Phosphorus 2.4 (L) 2.5 - 4.6 mg/dL  °  Comment: Performed at [facility], [address] [place], [place] [address]  °Glucose, capillary     Status: Abnormal  ° Collection Time: 11/15/21  8:24 PM  °Result Value Ref Range  ° Glucose-Capillary 114 (H) 70 - 99 mg/dL  °  Comment: Glucose reference range applies only to samples taken after fasting for at least 8 hours.  °Glucose, capillary     Status: Abnormal  ° Collection Time: 11/15/21 11:53 PM  °Result Value Ref Range  ° Glucose-Capillary 133 (H) 70 - 99 mg/dL  °  Comment: Glucose reference range applies only to samples taken after fasting for at least 8 hours.  °Glucose, capillary     Status: Abnormal  ° Collection Time: 11/16/21  3:50 AM  °Result Value Ref Range  ° Glucose-Capillary 142 (H) 70 - 99 mg/dL  °  Comment: Glucose reference range applies only to samples taken after fasting for at least 8 hours.  °Triglycerides     Status: Abnormal  ° Collection Time: 11/16/21  4:15 AM  °Result Value Ref Range  ° Triglycerides 447 (H) <150 mg/dL  °  Comment: Performed at [facility], [address] [place], [place] [address]  °CBC     Status: Abnormal  ° Collection Time: 11/16/21  4:15 AM  °Result Value Ref Range  ° WBC 17.6 (H) 4.0 - 10.5 K/uL  ° RBC 3.34 (L) 3.87 - 5.11 MIL/uL  ° Hemoglobin 9.2 (L) 12.0 - 15.0 g/dL  ° HCT 30.8 (L) 36.0 - 46.0 %  ° MCV 92.2 80.0 - 100.0 fL  ° MCH 27.5 26.0 - 34.0 pg  ° MCHC 29.9 (L) 30.0 - 36.0 g/dL  ° RDW 16.7 (H) 11.5 - 15.5 %  ° Platelets 226 150 - 400 K/uL   ° nRBC 0.4 (H) 0.0 - 0.2 %  °  Comment: Performed at  [facility], [address] [place], [place] [address]  °Basic metabolic panel     Status: Abnormal  ° Collection Time: 11/16/21  4:15 AM  °Result Value Ref Range  ° Sodium 134 (L) 135 - 145 mmol/L  ° Potassium 3.4 (L) 3.5 - 5.1 mmol/L  ° Chloride 102 98 - 111 mmol/L  ° CO2 23 22 - 32 mmol/L  ° Glucose, Bld 139 (H) 70 - 99 mg/dL  °  Comment: Glucose reference   range applies only to samples taken after fasting for at least 8 hours.  ° BUN 15 6 - 20 mg/dL  ° Creatinine, Ser 0.87 0.44 - 1.00 mg/dL  ° Calcium 8.7 (L) 8.9 - 10.3 mg/dL  ° GFR, Estimated >60 >60 mL/min  °  Comment: (NOTE) °Calculated using the CKD-EPI Creatinine Equation (2021) °  ° Anion gap 9 5 - 15  °  Comment: Performed at [facility], [address] [place], [place] [address]  °I-STAT 7, (LYTES, BLD GAS, ICA, H+H)     Status: Abnormal  ° Collection Time: 11/16/21  7:49 AM  °Result Value Ref Range  ° pH, Arterial 7.269 (L) 7.35 - 7.45  ° pCO2 arterial 54.0 (H) 32 - 48 mmHg  ° pO2, Arterial 62 (L) 83 - 108 mmHg  ° Bicarbonate 24.8 20.0 - 28.0 mmol/L  ° TCO2 26 22 - 32 mmol/L  ° O2 Saturation 88 %  ° Acid-base deficit 3.0 (H) 0.0 - 2.0 mmol/L  ° Sodium 136 135 - 145 mmol/L  ° Potassium 3.7 3.5 - 5.1 mmol/L  ° Calcium, Ion 1.35 1.15 - 1.40 mmol/L  ° HCT 38.0 36.0 - 46.0 %  ° Hemoglobin 12.9 12.0 - 15.0 g/dL  ° Patient temperature 98.0 F   ° Sample type ARTERIAL   ° ° °Recent Results (from the past 240 hour(s))  °Urine Culture     Status: Abnormal  ° Collection Time: 11/12/21  3:36 PM  ° Specimen: In/Out Cath Urine  °Result Value Ref Range Status  ° Specimen Description IN/OUT CATH URINE  Final  ° Special Requests   Final  °  NONE °Performed at [facility], [address] [place], [place] [address] °  ° Culture (A)  Final  °  1,000 COLONIES/mL ENTEROBACTER CLOACAE °3,000 COLONIES/mL PSEUDOMONAS AERUGINOSA °  ° Report Status 11/15/2021 FINAL  Final  ° Organism ID, Bacteria  ENTEROBACTER CLOACAE (A)  Final  ° Organism ID, Bacteria PSEUDOMONAS AERUGINOSA (A)  Final  °    Susceptibility  ° Enterobacter cloacae - MIC*  °  CEFAZOLIN >=64 RESISTANT Resistant   °  CEFEPIME <=0.12 SENSITIVE Sensitive   °  CIPROFLOXACIN <=0.25 SENSITIVE Sensitive   °  GENTAMICIN <=1 SENSITIVE Sensitive   °  IMIPENEM <=0.25 SENSITIVE Sensitive   °  NITROFURANTOIN 32 SENSITIVE Sensitive   °  TRIMETH/SULFA <=20 SENSITIVE Sensitive   °  PIP/TAZO <=4 SENSITIVE Sensitive   °  * 1,000 COLONIES/mL ENTEROBACTER CLOACAE  ° Pseudomonas aeruginosa - MIC*  °  CEFTAZIDIME 4 SENSITIVE Sensitive   °  CIPROFLOXACIN <=0.25 SENSITIVE Sensitive   °  GENTAMICIN <=1 SENSITIVE Sensitive   °  IMIPENEM 2 SENSITIVE Sensitive   °  PIP/TAZO 8 SENSITIVE Sensitive   °  CEFEPIME 2 SENSITIVE Sensitive   °  * 3,000 COLONIES/mL PSEUDOMONAS AERUGINOSA  °Respiratory (~20 pathogens) panel by PCR     Status: None  ° Collection Time: 11/12/21  3:37 PM  ° Specimen: Nasopharyngeal Swab; Respiratory  °Result Value Ref Range Status  ° Adenovirus NOT DETECTED NOT DETECTED Final  ° Coronavirus 229E NOT DETECTED NOT DETECTED Final  °  Comment: (NOTE) °The Coronavirus on the Respiratory Panel, DOES NOT test for the novel  °Coronavirus (2019 nCoV) °  ° Coronavirus HKU1 NOT DETECTED NOT DETECTED Final  ° Coronavirus NL63 NOT DETECTED NOT DETECTED Final  ° Coronavirus OC43 NOT DETECTED NOT DETECTED Final  ° Metapneumovirus NOT DETECTED NOT DETECTED Final  °   Rhinovirus / Enterovirus NOT DETECTED NOT DETECTED Final  ° Influenza A NOT DETECTED NOT DETECTED Final  ° Influenza B NOT DETECTED NOT DETECTED Final  ° Parainfluenza Virus 1 NOT DETECTED NOT DETECTED Final  ° Parainfluenza Virus 2 NOT DETECTED NOT DETECTED Final  ° Parainfluenza Virus 3 NOT DETECTED NOT DETECTED Final  ° Parainfluenza Virus 4 NOT DETECTED NOT DETECTED Final  ° Respiratory Syncytial Virus NOT DETECTED NOT DETECTED Final  ° Bordetella pertussis NOT DETECTED NOT DETECTED Final  ° Bordetella  Parapertussis NOT DETECTED NOT DETECTED Final  ° Chlamydophila pneumoniae NOT DETECTED NOT DETECTED Final  ° Mycoplasma pneumoniae NOT DETECTED NOT DETECTED Final  °  Comment: Performed at [facility], [address] [place], [place] [address]  °Blood Culture (routine x 2)     Status: None (Preliminary result)  ° Collection Time: 11/12/21  4:23 PM  ° Specimen: Site Not Specified; Blood  °Result Value Ref Range Status  ° Specimen Description SITE NOT SPECIFIED  Final  ° Special Requests   Final  °  BOTTLES DRAWN AEROBIC AND ANAEROBIC Blood Culture adequate volume  ° Culture   Final  °  NO GROWTH 3 DAYS °Performed at [facility], [address] [place], [place] [address] °  ° Report Status PENDING  Incomplete  °Resp Panel by RT-PCR (Flu A&B, Covid) Nasopharyngeal Swab     Status: None  ° Collection Time: 11/12/21  4:25 PM  ° Specimen: Nasopharyngeal Swab; Nasopharyngeal(NP) swabs in vial transport medium  °Result Value Ref Range Status  ° SARS Coronavirus 2 by RT PCR NEGATIVE NEGATIVE Final  °  Comment: (NOTE) °SARS-CoV-2 target nucleic acids are NOT DETECTED. ° °The SARS-CoV-2 RNA is generally detectable in upper respiratory °specimens during the acute phase of infection. The lowest °concentration of SARS-CoV-2 viral copies this assay can detect is °138 copies/mL. A negative result does not preclude SARS-Cov-2 °infection and should not be used as the sole basis for treatment or °other patient management decisions. A negative result may occur with  °improper specimen collection/handling, submission of specimen other °than nasopharyngeal swab, presence of viral mutation(s) within the °areas targeted by this assay, and inadequate number of viral °copies(<138 copies/mL). A negative result must be combined with °clinical observations, patient history, and epidemiological °information. The expected result is Negative. ° °Fact Sheet for Patients:  °https://www.fda.gov/media/152166/download ° °Fact Sheet for  Healthcare Providers:  °https://www.fda.gov/media/152162/download ° °This test is no t yet approved or cleared by the [place] FDA and  °has been authorized for detection and/or diagnosis of SARS-CoV-2 by °FDA under an Emergency Use Authorization (EUA). This EUA will remain  °in effect (meaning this test can be used) for the duration of the °COVID-19 declaration under Section 564(b)(1) of the Act, 21 °U.S.C.section 360bbb-3(b)(1), unless the authorization is terminated  °or revoked sooner.  ° ° °  ° Influenza A by PCR NEGATIVE NEGATIVE Final  ° Influenza B by PCR NEGATIVE NEGATIVE Final  °  Comment: (NOTE) °The Xpert Xpress SARS-CoV-2/FLU/RSV plus assay is intended as an aid °in the diagnosis of influenza from Nasopharyngeal swab specimens and °should not be used as a sole basis for treatment. Nasal washings and °aspirates are unacceptable for Xpert Xpress SARS-CoV-2/FLU/RSV °testing. ° °Fact Sheet for Patients: °https://www.fda.gov/media/152166/download ° °Fact Sheet for Healthcare Providers: °https://www.fda.gov/media/152162/download ° °This test is not yet approved or cleared by the [place] FDA and °has been authorized for detection and/or diagnosis of SARS-CoV-2 by °FDA under an Emergency Use   Authorization (EUA). This EUA will remain in effect (meaning this test can be used) for the duration of the COVID-19 declaration under Section 564(b)(1) of the Act, 21 U.S.C. section 360bbb-3(b)(1), unless the authorization is terminated or revoked.  Performed at [facility], [place] [address], [place], [place] [address]   Blood Culture (routine x 2)     Status: None (Preliminary result)   Collection Time: 11/12/21  4:44 PM   Specimen: Site Not Specified; Blood  Result Value Ref Range Status   Specimen Description SITE NOT SPECIFIED  Final   Special Requests   Final    BOTTLES DRAWN AEROBIC AND ANAEROBIC Blood Culture results may not be optimal due to an excessive volume of blood received in  culture bottles   Culture   Final    NO GROWTH 3 DAYS Performed at [facility], [place] [address], [place], [place] [address]    Report Status PENDING  Incomplete  MRSA Next Gen by PCR, Nasal     Status: Abnormal   Collection Time: 11/12/21  7:36 PM   Specimen: Nasal Mucosa; Nasal Swab  Result Value Ref Range Status   MRSA by PCR Next Gen DETECTED (A) NOT DETECTED Final    Comment: RESULT CALLED TO, READ BACK BY AND VERIFIED WITH: V [id number],RN_[id number]  11/12/21 [place] (NOTE) The GeneXpert MRSA Assay (FDA approved for NASAL specimens only), is one component of a comprehensive MRSA colonization surveillance program. It is not intended to diagnose MRSA infection nor to guide or monitor treatment for MRSA infections. Test performance is not FDA approved in patients less than 29 years old. Performed at [facility], [place] [address] [place], [place] [address]   Culture, Respiratory w Gram Stain     Status: None   Collection Time: 11/13/21  2:09 AM   Specimen: Bronchoalveolar Lavage; Respiratory  Result Value Ref Range Status   Specimen Description BRONCHIAL ALVEOLAR LAVAGE  Final   Special Requests NONE  Final   Gram Stain   Final    MODERATE WBC PRESENT, PREDOMINANTLY PMN FEW GRAM POSITIVE COCCI IN PAIRS AND CHAINS Performed at [facility], [address] [place], [place] [address]    Culture FEW METHICILLIN RESISTANT STAPHYLOCOCCUS AUREUS  Final   Report Status 11/15/2021 FINAL  Final   Organism ID, Bacteria METHICILLIN RESISTANT STAPHYLOCOCCUS AUREUS  Final      Susceptibility   Methicillin resistant staphylococcus aureus - MIC*    CIPROFLOXACIN >=8 RESISTANT Resistant     ERYTHROMYCIN >=8 RESISTANT Resistant     GENTAMICIN <=0.5 SENSITIVE Sensitive     OXACILLIN >=4 RESISTANT Resistant     TETRACYCLINE <=1 SENSITIVE Sensitive     VANCOMYCIN 1 SENSITIVE Sensitive     TRIMETH/SULFA <=10 SENSITIVE Sensitive     CLINDAMYCIN <=0.25 SENSITIVE Sensitive     RIFAMPIN  <=0.5 SENSITIVE Sensitive     Inducible Clindamycin NEGATIVE Sensitive     * FEW METHICILLIN RESISTANT STAPHYLOCOCCUS AUREUS  CSF culture w Stat Gram Stain     Status: None (Preliminary result)   Collection Time: 11/15/21  8:02 AM   Specimen: CSF; Cerebrospinal Fluid  Result Value Ref Range Status   Specimen Description CSF  Final   Special Requests NONE  Final   Gram Stain   Final    WBC PRESENT,BOTH PMN AND MONONUCLEAR NO ORGANISMS SEEN CYTOSPIN SMEAR Performed at [facility], [place] [address], [place], [place] [address]    Culture PENDING  Incomplete  Report Status PENDING  Incomplete  Acid Fast Smear (AFB)     Status: None   Collection Time: 11/15/21  9:23 AM   Specimen: CSF; Cerebrospinal Fluid  Result Value Ref Range Status   AFB Specimen Processing Direct Inoculation  Final   Acid Fast Smear Negative  Final    Comment: (NOTE) Performed At: [facility] [place], [place] [id number] Rush Farmer MD YT:[id number]    Source (AFB) CSF  Final    Comment: Performed at [facility], [place] [address] [place], [place] [address]    Lipid Panel Recent Labs    11/16/21 0415  TRIG 447*     Studies/Results: MR BRAIN W WO CONTRAST  Result Date: 11/14/2021 CLINICAL DATA:  Mental status change.  Leukocytosis EXAM: MRI HEAD WITHOUT AND WITH CONTRAST TECHNIQUE: Multiplanar, multiecho pulse sequences of the brain and surrounding structures were obtained without and with intravenous contrast. CONTRAST:  8.9m GADAVIST GADOBUTROL 1 MMOL/ML IV SOLN COMPARISON:  CT head 11/12/2021 FINDINGS: Brain: Numerous small lesions are seen throughout the brain. These are best seen on diffusion-weighted imaging and show restricted diffusion. Some of the lesions show mild peripheral enhancement. Some of the lesions show mild surrounding edema on FLAIR. There are lesions in both cerebellar hemispheres bilaterally measuring under 1 cm. Numerous lesions are seen throughout  both cerebral hemispheres. Most of lesions are near the gray-white junction but there also are lesions in the right thalamus and putamen bilaterally and in the splenium. There is also a linear area of restricted diffusion within the left lateral ventricle. These lesions are felt to be due to infection and brain abscesses. There is likely ventriculitis on the left. Many of the lesions show susceptibility compatible with small areas of hemorrhage Chronic infarct left frontal lobe. Chronic hemorrhage in the left frontal infarct. Ventricle size normal. No midline shift. Vascular: Normal arterial flow voids Skull and upper cervical spine: No focal lesion. Sinuses/Orbits: Extensive mucosal edema throughout the paranasal sinuses. Air-fluid level right maxillary sinus. Bilateral mastoid effusion. Negative orbit Other: None IMPRESSION: Numerous lesions in the brain. There are lesions in the cerebellar hemispheres and in both cerebral hemispheres widely distributed. Greater than 50 lesions are identified. The lesions have a rounded appearance with restricted diffusion and mild enhancement. Many lesions show evidence of mild associated hemorrhage. Mild edema. Restricted diffusion in the left lateral ventricle likely due to ventriculitis. 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 Differential diagnosis includes metastatic disease and demyelinating disease. Correlate with lumbar puncture results. These results were called by telephone at the time of interpretation on 11/14/2021 at 4:46 pm to provider CHI ELLISON , who verbally acknowledged these results. Electronically Signed   By: CFranchot GalloM.D.   On: 11/14/2021 16:48   DG Abd Portable 1V  Result Date: 11/15/2021 CLINICAL DATA:  Encounter for feeding tube placement EXAM: PORTABLE ABDOMEN - 1 VIEW COMPARISON:  None. FINDINGS: Enteric feeding tube tip overlies the distal stomach. Paucity of bowel gas. IMPRESSION:  Enteric tube within the distal stomach. Electronically Signed   By: PMacy MisM.D.   On: 11/15/2021 11:13    Medications: Scheduled:  chlorhexidine gluconate (MEDLINE KIT)  15 mL Mouth Rinse BID   Chlorhexidine Gluconate Cloth  6 each Topical Daily   fentaNYL (SUBLIMAZE) injection  50 mcg Intravenous Once   heparin injection (subcutaneous)  5,000 Units Subcutaneous Q8H   insulin aspart  0-9 Units Subcutaneous Q4H  insulin aspart  3 Units Subcutaneous Q4H  ° mouth rinse  15 mL Mouth Rinse 10 times per day  ° pantoprazole  40 mg Intravenous Q12H  ° sodium chloride flush  3 mL Intravenous Once  ° °Continuous: ° sodium chloride    ° sodium chloride 5 mL/hr at 11/15/21 0803  ° sodium chloride    ° ceFEPime (MAXIPIME) IV Stopped (11/16/21 0244)  ° feeding supplement (VITAL AF 1.2 CAL)    ° fentaNYL infusion INTRAVENOUS 100 mcg/hr (11/16/21 0600)  ° levETIRAcetam Stopped (11/15/21 2141)  ° linezolid (ZYVOX) IV Stopped (11/15/21 2259)  ° metronidazole Stopped (11/16/21 0149)  ° norepinephrine (LEVOPHED) Adult infusion Stopped (11/15/21 1134)  ° potassium chloride 10 mEq (11/16/21 0748)  ° propofol (DIPRIVAN) infusion 25 mcg/kg/min (11/16/21 0600)  ° °Assessment: 56-year-old woman with a history of cervical cancer, status post nephrectomy 2 months ago, colostomy, chronic occlusion of the left common carotid artery, CAD, diabetes, depression, hypertension, remote history of stroke without residual deficits who was brought in by EMS for altered mental status. She had had a flulike illness for a week PTA and was found confused on 2/19. The patient had acute PEA arrest while in the ED on Sunday (2/19) with CPR time of 2 minutes. Diagnosed with acute respiratory failure with hypoxemia and was intubated at that time. MRI brain performed on Tuesday revealed numerous (> 50) widely distributed lesions in the cerebral hemispheres as well as the cerebellum.  °- Neurological exam on sedation is with minimal responses today  (overall rates of sedating drips is higher than yesterday). Overbreathing the vent. On Fentanyl and Propofol gtts.  °- MRI brain with and without contrast: Numerous (> 50) small, round DWI+ lesions are scattered throughout the brain parenchyma, infratentorially and supratentorially, some with ring-like morphologies. On FLAIR images, several of the lesions exhibit adjacent vasogenic edema. Some of the lesions exhibit corresponding T1-hypointensity. A small subset of the lesions exhibit enhancement on the post-contrast images. Many lesions show evidence of mild associated hemorrhage. Restricted diffusion in the left lateral ventricle likely due to ventriculitis. 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 DDx for underlying pathogen includes bacterial (including tuberculosis), fungal (histoplasmosis, aspergillosis), protozoan (toxoplasmosis), parasitic (neurocysticercosis). Lower on the DDx would be an atypical presentation of CNS inflammatory disease such as MS or CNS lupus, or metastatic disease.    °- Acute GI bleed, most recent hemoglobin 10.2>12.9.  History of NSAID abuse.  °- EEG: Background attenuation, generalized. This technically difficult study is suggestive of profound degree of encephalopathy, nonspecific etiology.  No seizures or epileptiform discharges were seen throughout the recording. °-WBC 22.4 > 17.6, continues to be tachycardic °-CSF from LP showed: cell count markedly abnormal with WBC 585 of which 84% were segmented neutrophils. Glucose 21 (low), Total protein 191 (high). Consistent with bacterial meningitis. °-Cryptococcal antigen negative, Acid Fast Smear negative, remainder of infectious labs In process °Bacterial culture preliminary shows only WBC present  °  °Recommendations: °- Empiric ABX for probable cerebral abscesses. Changed from Vancomycin to Linezolid. On cefepime and Flagyl. Stopped acyclovir.  °- Neurosurgery  has been consulted and has determined that the patient is not a good candidate for lesion biopsy °- Follow up on remaining CSF studies including VDRL, gram stain, fungal stain, fungal and bacterial culture pending.  °- TEE to be performed due to concerns of bacteremia °-ID consulted and recommended switching from vancomycin to Linezolid to cover MRSA pneumonia, Listeria, and Nocardia as   well as VRE coverage. Also recommended Liver MR w wo contrast. - LTM added as CCM noticed some twitching in arm - Continue Keppra at 1500 mg BID - Discussed with CCM   35 minutes spent in the neurological evaluation and management of this critically ill patient.    Assisting with this follow up evaluation:  France Ravens, MD PGY1 Resident   Electronically signed: Dr. Kerney Elbe  LOS: 4 days  11/16/2021  8:16 AM

## 2021-11-16 NOTE — Progress Notes (Addendum)
NAME:  Kristen Murphy, MRN:  [id number], DOB:  1966/04/22, LOS: 4 ADMISSION DATE:  11/12/2021, CONSULTATION DATE: 11/12/2021 REFERRING MD: Dr. Langston Masker, CHIEF COMPLAINT: PEA arrest  History of Present Illness:   56 year old woman with a history of tobacco use, cervical cancer ([facility]), post nephrectomy and colostomy, CAD with LAD stent 01/2019, diabetes, depression, hypertension, remote CVA without known deficits.  Also with a history of substance abuse.  Brought in by her son to the ED 2/19 with fever, altered mental status and slurred speech. Apparently has had a flulike illness for a week, was found confused on 2/19.  Some suspicion by son that she may have been taking more prescription narcotics, also taking many NSAIDs per his report.  She was disoriented in the ED, febrile 103F, hyperglycemic. In the ED she apparently experienced acute hypoxemia, had some blood from her mouth, question emesis versus hemoptysis.  Devolved to PEA and required emergent ET intubation and CPR.  ROSC after 2 minutes.  OG tube placed with bright red blood obtained.  Chest x-ray with mild right hilar prominence, more notable on postintubation film.  No infiltrates or effusions She has received empiric antibiotics, is receiving 1 unit PRBC, is about to start Protonix infusion. Hemodynamically stabilized, mechanically ventilated.   Pertinent  Medical History   Past Medical History:  Diagnosis Date   Anemia    Anxiety    Arthritis    Asthma    Cervical cancer ([place])    Chronic kidney disease    Coronary artery disease    Depression    Diabetes mellitus without complication (HCC)    Dyspnea    Headache    Heart murmur    Hx of adenomatous polyp of colon    Hypertension    Rectovaginal fistula    Seizures ([place])    Stercoral ulcer of rectum 12/2020   Stroke [facility])     Significant Hospital Events: Including procedures, antibiotic start and stop dates in addition to other pertinent events   Head CT 2/19 > no  acute CT findings, old left basal ganglia lacunar, old left frontal cortical and subcortical infarct MR Brain 11/15/21 - Numerous lesions with mild associated hemorrhage. Mild edema.  Interim History / Subjective:   No events overnight  Objective   Blood pressure (!) 144/52, pulse 95, temperature 98.7 F (37.1 C), resp. rate 16, height 5\' 2"  (1.575 m), weight 83 kg, SpO2 93 %.    Vent Mode: PRVC FiO2 (%):  [80 %-90 %] 80 % Set Rate:  [28 bmp] 28 bmp Vt Set:  [400 mL] 400 mL PEEP:  [10 cmH20] 10 cmH20 Plateau Pressure:  [17 cmH20-22 cmH20] 22 cmH20   Intake/Output Summary (Last 24 hours) at 11/16/2021 0700 Last data filed at 11/16/2021 0600 Gross per 24 hour  Intake 1550.34 ml  Output 1850 ml  Net -299.66 ml   Filed Weights   11/14/21 0500 11/15/21 0500 11/16/21 0500  Weight: 82.6 kg 82.6 kg 83 kg   Physical Exam: General: Critically ill-appearing, sedated HENT: [place], AT,  ETT in place Eyes: EOMI, no scleral icterus Respiratory: Coarse breath sounds bilaterally.  No crackles, wheezing or rales Cardiovascular: RRR, -M/R/G, no JVD GI: BS+, soft, nontender Extremities:-Edema,-tenderness Neuro: Minimally reactive PERRL   CTA 11/12/21 - RLL cavitary infiltrate CT A/P 11/12/21 - S/p colostomy, s/p right nephrectomy CXR 11/16/21 - Small left pleural effusion with atelectasis  WBC improved to 17 ABG withh adequate oxygenation  Resolved Hospital Problem list  Assessment & Plan:   Multiple brain abscesses Bacterial meningitis Acute metabolic encephalopathy secondary to above Repeat CT head 2/19 neg for acute findings.  EEG neg for seizures.  Consider also substances as son concerned about possible narcotic use.  UDS only positive for benzo. MRI brain 2/21 numerous lesions with mild associated hemorrhage and edema. Neurosurgery reviewed imaging and patient not a good candidate for lesion aspiration. -Continue antibiotics: Linezolid (2/22-), Cefepime and Flagyl -Appreciate Neuro  recs -Appreciate ID recs -F/u LP studies -F/u TTE and MRI liver for prior reported lesion -Droplet precautions -Contact precautions for MRSA  Acute respiratory failure with hypoxemia secondary to necrotizing MRSA pneumonia, bacterial meningitis Septic shock - resolved Urine culture polymicrobial with 1k Enterobacter and 3K Pseudomona -Full vent support. Wean FIO2/PEEP. PRVC 8 cc/kg. Wean FIO2/PEEP for SpO2 goal 88-95%.  -Reviewed ABG. No vent changes -VAP  -ABG and CXR this AM -Continue antibiotics -F/u final sputum cultures  -Lasix for goal net neg daily  Acute cardiopulmonary arrest, PEA arrest -Defer TTM, CPR time 2 minutes, initiated immediately -Follow for any evidence of endorgan injury -Supportive care -Telemetry  Acute blood loss/chronic anemia - Hg stable History of NSAID abuse. EGD blood clots in gastric fundus/body, erosive gastropathy, no stigmata of bleeding but OG tube trauma in body -No history of alcohol, octreotide deferred -PPI BID -Trend CBC  Hypertension History of CAD -Holding amlodipine, metoprolol, lisinopril until stabilizing -Hold aspirin in setting of GI blood loss  Diabetes with hyperglycemia -CBGs, SSI if > 180 -Home metformin on hold -Home Farxiga on hold  Hyponatremia, hypokalemia Hypocalcemia - corrected to wnl in setting hypoalbuminemia -Repleted K -BMET PM  Best Practice (right click and "Reselect all SmartList Selections" daily)   Diet/type: tubefeeds DVT prophylaxis: prophylactic heparin  GI prophylaxis: PPI Lines: Central line Foley:  N/A Code Status:  full code Last date of multidisciplinary goals of care discussion [Updated son on 2/20]  Labs   CBC: Recent Labs  Lab 11/12/21 1508 11/12/21 1514 11/13/21 0228 11/13/21 0353 11/14/21 0315 11/14/21 0826 11/14/21 1838 11/15/21 0447 11/15/21 0449 11/16/21 0415  WBC 18.6*  --  30.2*  --  26.7*  --   --   --  22.4* 17.6*  NEUTROABS 16.7*  --   --   --   --   --   --    --   --   --   HGB 8.9*   < > 10.7*   < > 10.1* 11.2* 10.5* 10.5* 10.0* 9.2*  HCT 28.9*   < > 35.1*   < > 32.8* 33.0* 31.0* 31.0* 32.6* 30.8*  MCV 87.0  --  90.0  --  90.4  --   --   --  91.3 92.2  PLT 251  --  214  --  181  --   --   --  211 226   < > = values in this interval not displayed.    Basic abnormalities panel: Recent Labs  Lab 11/12/21 1931 11/12/21 1946 11/13/21 0228 11/13/21 0353 11/14/21 0315 11/14/21 0824 11/14/21 0826 11/14/21 1400 11/14/21 1700 11/14/21 1838 11/15/21 0447 11/15/21 0449 11/15/21 1700 11/16/21 0415  NA  --    < > 135   < > 131*  --    < > 132*  --  132* 133* 132*  --  134*  K  --    < > 3.8   < > 3.3*  --    < > 3.9  --  3.7 4.0  3.9  --  3.4*  CL  --   --  99  --  96*  --   --  99  --   --   --  99  --  102  CO2  --   --  26  --  25  --   --  22  --   --   --  22  --  23  GLUCOSE  --   --  125*  --  213*  --   --  120*  --   --   --  131*  --  139*  BUN  --   --  14  --  12  --   --  13  --   --   --  15  --  15  CREATININE  --   --  1.10*  --  1.06*  --   --  0.98  --   --   --  0.87  --  0.87  CALCIUM  --   --  7.8*  --  7.6*  --   --  7.7*  --   --   --  8.5*  --  8.7*  MG 2.0  --   --   --   --  1.7  --   --  1.6*  --   --  2.0 1.9  --   PHOS 5.7*  --   --   --   --  3.7  --   --  2.8  --   --  2.7 2.4*  --    < > = values in this interval not displayed.   GFR: Estimated Creatinine Clearance: 72.2 mL/min (by C-G formula based on SCr of 0.87 mg/dL). Recent Labs  Lab 11/12/21 1518 11/12/21 1931 11/12/21 2233 11/13/21 0228 11/14/21 0315 11/14/21 0824 11/15/21 0449 11/16/21 0415  PROCALCITON  --  2.04  --   --   --   --   --   --   WBC  --   --   --  30.2* 26.7*  --  22.4* 17.6*  LATICACIDVEN 1.5 2.5* 1.9  --   --  1.2  --   --     Liver Function Tests: Recent Labs  Lab 11/12/21 1508 11/13/21 0228  AST 12* 84*  ALT 12 51*  ALKPHOS 58 65  BILITOT 0.3 0.2*  PROT 7.0 6.4*  ALBUMIN 2.5* 2.3*   No results for input(s):  LIPASE, AMYLASE in the last 168 hours. No results for input(s): AMMONIA in the last 168 hours.  ABG    Component Value Date/Time   PHART 7.251 (L) 11/15/2021 0447   PCO2ART 53.4 (H) 11/15/2021 0447   PO2ART 67 (L) 11/15/2021 0447   HCO3 23.5 11/15/2021 0447   TCO2 25 11/15/2021 0447   ACIDBASEDEF 4.0 (H) 11/15/2021 0447   O2SAT 89 11/15/2021 0447     Coagulation Profile: Recent Labs  Lab 11/12/21 1508  INR 1.2    Cardiac Enzymes: No results for input(s): CKTOTAL, CKMB, CKMBINDEX, TROPONINI in the last 168 hours.  HbA1C: Hgb A1c MFr Bld  Date/Time Value Ref Range Status  11/12/2021 07:31 PM 7.2 (H) 4.8 - 5.6 % Final    Comment:    (NOTE) Pre diabetes:          5.7%-6.4%  Diabetes:              >6.4%  Glycemic control for   <7.0%  adults with diabetes   02/19/2019 03:32 AM 9.2 (H) 4.8 - 5.6 % Final    Comment:    (NOTE) Pre diabetes:          5.7%-6.4% Diabetes:              >6.4% Glycemic control for   <7.0% adults with diabetes     CBG: Recent Labs  Lab 11/15/21 1114 11/15/21 1631 11/15/21 2024 11/15/21 2353 11/16/21 0350  GLUCAP 123* 123* 114* 133* 142*   Critical care time: 50 minutes   The patient is critically ill with multiple organ systems failure and requires high complexity decision making for assessment and support, frequent evaluation and titration of therapies, application of advanced monitoring technologies and extensive interpretation of multiple databases.  Independent Critical Care Time: 50 Minutes.   Rodman Pickle, M.D. [facility] Pulmonary/Critical Care Medicine 11/16/2021 7:00 AM   Please see Amion for pager number to reach on-call Pulmonary and Critical Care Team.

## 2021-11-16 NOTE — Progress Notes (Signed)
K+ 3.4 Replaced per protocol  

## 2021-11-16 NOTE — Procedures (Addendum)
Patient Name: Kristen Murphy  MRN: [id number]  Epilepsy Attending: Lora Havens  Referring Physician/Provider: Kerney Elbe, MD Date: 11/16/2021 Duration: 21.48 mins   Patient history: 56 year old woman with a history of cervical cancer, status post nephrectomy 2 months ago, colostomy, CAD, diabetes, depression, hypertension, remote history of stroke without residual deficits who was brought in by EMS for altered mental status.  EEG to evaluate for seizure.   Level of alertness: obtunded   AEDs during EEG study: propofol, LEV   Technical aspects: This EEG study was done with scalp electrodes positioned according to the 10-20 International system of electrode placement. Electrical activity was acquired at a sampling rate of 500Hz  and reviewed with a high frequency filter of 70Hz  and a low frequency filter of 1Hz . EEG data were recorded continuously and digitally stored.    Description: EEG showed continuous generalized and lateralized right hemisphere low amplitude 3 to 6 Hz theta-delta slowing.  Frequent spikes were noted in left frontotemporal region, at times quasiperiodic every 3 to 6 seconds.  Sharp transients were also noted in right frontotemporal region.  Hyperventilation and photic stimulation were not performed.      ABNORMALITY -Spike, left frontotemporal region -Continuous slow, generalized and lateralized right hemisphere   IMPRESSION: This study showed evidence of epileptogenicity arising from left frontotemporal region.  Additionally study is suggestive of severe diffuse encephalopathy, nonspecific etiology.  No seizures were seen throughout the recording.  Linley Moskal [name]

## 2021-11-16 NOTE — Plan of Care (Signed)
°  Problem: Education: Goal: Knowledge of General Education information will improve Description: Including pain rating scale, medication(s)/side effects and non-pharmacologic comfort measures Outcome: Progressing   Problem: Health Behavior/Discharge Planning: Goal: Ability to manage health-related needs will improve Outcome: Progressing   Problem: Clinical Measurements: Goal: Ability to maintain clinical measurements within normal limits will improve Outcome: Progressing Goal: Will remain free from infection Outcome: Progressing Goal: Diagnostic test results will improve Outcome: Progressing Goal: Respiratory complications will improve Outcome: Progressing Goal: Cardiovascular complication will be avoided Outcome: Progressing   Problem: Nutrition: Goal: Adequate nutrition will be maintained Outcome: Progressing   Problem: Coping: Goal: Level of anxiety will decrease Outcome: Progressing   Problem: Elimination: Goal: Will not experience complications related to bowel motility Outcome: Progressing Goal: Will not experience complications related to urinary retention Outcome: Progressing   Problem: Pain Managment: Goal: General experience of comfort will improve Outcome: Progressing   Problem: Safety: Goal: Ability to remain free from injury will improve Outcome: Progressing   Problem: Skin Integrity: Goal: Risk for impaired skin integrity will decrease Outcome: Progressing   Problem: Activity: Goal: Ability to tolerate increased activity will improve Outcome: Progressing

## 2021-11-16 NOTE — Progress Notes (Signed)
Started cEEG study.  Notified [facility] monitoring.  Tested patient event button. 

## 2021-11-16 NOTE — Progress Notes (Signed)
EEG complete - results pending 

## 2021-11-17 DIAGNOSIS — G060 Intracranial abscess and granuloma: Secondary | ICD-10-CM | POA: Diagnosis not present

## 2021-11-17 DIAGNOSIS — A419 Sepsis, unspecified organism: Secondary | ICD-10-CM | POA: Diagnosis not present

## 2021-11-17 DIAGNOSIS — I469 Cardiac arrest, cause unspecified: Secondary | ICD-10-CM | POA: Diagnosis not present

## 2021-11-17 DIAGNOSIS — C539 Malignant neoplasm of cervix uteri, unspecified: Secondary | ICD-10-CM | POA: Diagnosis not present

## 2021-11-17 LAB — LACTIC ACID, PLASMA: Lactic Acid, Venous: 1.1 mmol/L (ref 0.5–1.9)

## 2021-11-17 LAB — BASIC METABOLIC PANEL
Anion gap: 9 (ref 5–15)
BUN: 17 mg/dL (ref 6–20)
CO2: 24 mmol/L (ref 22–32)
Calcium: 9.1 mg/dL (ref 8.9–10.3)
Chloride: 102 mmol/L (ref 98–111)
Creatinine, Ser: 0.9 mg/dL (ref 0.44–1.00)
GFR, Estimated: >60 mL/min (ref >60)
Glucose, Bld: 172 mg/dL — ABNORMAL HIGH (ref 70–99)
Potassium: 3.3 mmol/L — ABNORMAL LOW (ref 3.5–5.1)
Sodium: 135 mmol/L (ref 135–145)

## 2021-11-17 LAB — CBC
HCT: 33 % — ABNORMAL LOW (ref 36.0–46.0)
Hemoglobin: 9.7 g/dL — ABNORMAL LOW (ref 12.0–15.0)
MCH: 27.2 pg (ref 26.0–34.0)
MCHC: 29.4 g/dL — ABNORMAL LOW (ref 30.0–36.0)
MCV: 92.4 fL (ref 80.0–100.0)
Platelets: 231 10*3/uL (ref 150–400)
RBC: 3.57 MIL/uL — ABNORMAL LOW (ref 3.87–5.11)
RDW: 16.6 % — ABNORMAL HIGH (ref 11.5–15.5)
WBC: 17.5 10*3/uL — ABNORMAL HIGH (ref 4.0–10.5)
nRBC: 0.9 % — ABNORMAL HIGH (ref 0.0–0.2)

## 2021-11-17 LAB — GLUCOSE, CAPILLARY
Glucose-Capillary: 101 mg/dL — ABNORMAL HIGH (ref 70–99)
Glucose-Capillary: 107 mg/dL — ABNORMAL HIGH (ref 70–99)
Glucose-Capillary: 110 mg/dL — ABNORMAL HIGH (ref 70–99)
Glucose-Capillary: 132 mg/dL — ABNORMAL HIGH (ref 70–99)
Glucose-Capillary: 158 mg/dL — ABNORMAL HIGH (ref 70–99)
Glucose-Capillary: 159 mg/dL — ABNORMAL HIGH (ref 70–99)
Glucose-Capillary: 207 mg/dL — ABNORMAL HIGH (ref 70–99)

## 2021-11-17 LAB — POCT I-STAT 7, (LYTES, BLD GAS, ICA,H+H)
Acid-base deficit: 1 mmol/L (ref 0.0–2.0)
Bicarbonate: 26.2 mmol/L (ref 20.0–28.0)
Calcium, Ion: 1.41 mmol/L — ABNORMAL HIGH (ref 1.15–1.40)
HCT: 30 % — ABNORMAL LOW (ref 36.0–46.0)
Hemoglobin: 10.2 g/dL — ABNORMAL LOW (ref 12.0–15.0)
O2 Saturation: 94 %
Patient temperature: 98
Potassium: 3.7 mmol/L (ref 3.5–5.1)
Sodium: 138 mmol/L (ref 135–145)
TCO2: 28 mmol/L (ref 22–32)
pCO2 arterial: 57.9 mmHg — ABNORMAL HIGH (ref 32–48)
pH, Arterial: 7.263 — ABNORMAL LOW (ref 7.35–7.45)
pO2, Arterial: 81 mmHg — ABNORMAL LOW (ref 83–108)

## 2021-11-17 LAB — TRIGLYCERIDES: Triglycerides: 482 mg/dL — ABNORMAL HIGH (ref <150)

## 2021-11-17 LAB — CULTURE, BLOOD (ROUTINE X 2)
Culture: NO GROWTH
Culture: NO GROWTH
Special Requests: ADEQUATE

## 2021-11-17 LAB — MAGNESIUM: Magnesium: 1.9 mg/dL (ref 1.7–2.4)

## 2021-11-17 MED ORDER — LACTATED RINGERS IV BOLUS
500.0000 mL | Freq: Once | INTRAVENOUS | Status: AC
  Administered 2021-11-17: 500 mL via INTRAVENOUS

## 2021-11-17 MED ORDER — MAGNESIUM SULFATE 2 GM/50ML IV SOLN
2.0000 g | Freq: Once | INTRAVENOUS | Status: AC
  Administered 2021-11-17: 2 g via INTRAVENOUS
  Filled 2021-11-17: qty 50

## 2021-11-17 MED ORDER — FUROSEMIDE 10 MG/ML IJ SOLN
40.0000 mg | Freq: Once | INTRAMUSCULAR | Status: AC
  Administered 2021-11-17: 40 mg via INTRAVENOUS
  Filled 2021-11-17: qty 4

## 2021-11-17 MED ORDER — POTASSIUM CHLORIDE 20 MEQ PO PACK
20.0000 meq | PACK | ORAL | Status: AC
  Administered 2021-11-17 (×2): 20 meq
  Filled 2021-11-17 (×2): qty 1

## 2021-11-17 MED ORDER — POTASSIUM CHLORIDE 10 MEQ/50ML IV SOLN
10.0000 meq | INTRAVENOUS | Status: AC
  Administered 2021-11-17 (×4): 10 meq via INTRAVENOUS
  Filled 2021-11-17 (×4): qty 50

## 2021-11-17 NOTE — Procedures (Addendum)
Patient Name: Kristen Murphy  MRN: [id number]  Epilepsy Attending: Lora Havens  Referring Physician/Provider: Kerney Elbe, MD Duration: 11/16/2021 1127 to 11/17/2021 1127   Patient history: 56 year old woman with a history of cervical cancer, status post nephrectomy 2 months ago, colostomy, CAD, diabetes, depression, hypertension, remote history of stroke without residual deficits who was brought in by EMS for altered mental status.  EEG to evaluate for seizure.   Level of alertness: obtunded   AEDs during EEG study: propofol, LEV   Technical aspects: This EEG study was done with scalp electrodes positioned according to the 10-20 International system of electrode placement. Electrical activity was acquired at a sampling rate of 500Hz  and reviewed with a high frequency filter of 70Hz  and a low frequency filter of 1Hz . EEG data were recorded continuously and digitally stored.    Description: EEG showed continuous generalized and lateralized right hemisphere low amplitude 3 to 6 Hz theta-delta slowing.  Generalized periodic discharges with triphasic morphology were noted every 3 to 4 seconds.  Hyperventilation and photic stimulation were not performed.     Patient event button was pressed on 11/17/2021 at 0953 for unclear reasons.  Concomitant EEG before, during and after the event did not show any EEG to suggest seizure.   ABNORMALITY -Periodic discharges with triphasic morphology, generalized -Continuous slow, generalized and lateralized right hemisphere   IMPRESSION: This study showed generalized periodic discharges with triphasic morphology every 3 to 4 seconds.  This EEG pattern is on the ictal-interictal continuum with low potential for seizures.  It can also be seen due to toxic-metabolic causes like cefepime toxicity, hyperammonemia, postcardiac arrest.    Additionally study is suggestive of cortical dysfunction in right hemisphere likely secondary to underlying structural  abnormality as well as moderate to severe diffuse encephalopathy, nonspecific etiology.  No seizures were seen throughout the recording.  Patient event button was pressed on 11/17/2021 at 0953 for unclear reasons without concomitant EEG change.  This was most likely not an epileptic event.   Naasir Carreira [name]

## 2021-11-17 NOTE — Progress Notes (Signed)
LTM maintanence performed. Patient had no skin breakdown under the frontal leads. Test button was tested.

## 2021-11-17 NOTE — Progress Notes (Signed)
K+ 3.3, Mg 1.9 Replaced per protocol

## 2021-11-17 NOTE — Progress Notes (Signed)
[facility]  Date of Admission:  11/12/2021           Reason for visit: Follow up on brain abscess/pneumonia  Current antibiotics: Linezolid Cefepime Metronidazole   ASSESSMENT:    56 y.o. female admitted with:  Multiple brain abscesses, CSF c/w bacterial meningitis MRSA pneumonia Possible liver lesion Sepsis Acute hypoxemic respiratory failure PEA arrest with ROSC DM Hx of cervical CA Hx of VRE UTI    Patient presented with fever and encephalopathy concerning for CNS infection.  Thus far work-up has revealed MRSA pneumonia and MRI brain with multiple abscesses and CSF studies consistent with bacterial meningitis.  Multiple studies remain pending.  Blood cultures are negative at this time but would consider endocarditis and embolic disease.  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  RECOMMENDATIONS:    Continue current antibiotics TEE and liver MRI pending Follow up CSF studies Will follow.  Dr Baxter Flattery available as needed over the weekend.   Principal Problem:   Brain abscess Active Problems:   Cervical cancer (HCC)   Diabetes mellitus without complication ([place])   Cardiac arrest ([place])   Respiratory failure ([place])   Severe sepsis with septic shock (HCC)   MRSA pneumonia ([place])    MEDICATIONS:    Scheduled Meds:  acetaminophen  650 mg Oral Q4H   Or   acetaminophen (TYLENOL) oral liquid 160 mg/5 mL  650 mg Per Tube Q4H   Or   acetaminophen  650 mg Rectal Q4H   chlorhexidine gluconate (MEDLINE KIT)  15 mL Mouth Rinse BID   Chlorhexidine Gluconate Cloth  6 each Topical Daily   docusate  100 mg Per Tube BID   fentaNYL (SUBLIMAZE) injection  50 mcg Intravenous Once   heparin injection (subcutaneous)  5,000 Units Subcutaneous Q8H   insulin aspart  0-9 Units Subcutaneous Q4H   insulin aspart  3 Units Subcutaneous Q4H   mouth rinse  15 mL Mouth Rinse 10 times per day    pantoprazole  40 mg Intravenous Q12H   polyethylene glycol  17 g Per Tube Daily   sodium chloride flush  3 mL Intravenous Once   Continuous Infusions:  sodium chloride     sodium chloride 5 mL/hr at 11/15/21 0803   sodium chloride     ceFEPime (MAXIPIME) IV Stopped (11/17/21 0201)   feeding supplement (VITAL AF 1.2 CAL)     fentaNYL infusion INTRAVENOUS 100 mcg/hr (11/17/21 0600)   levETIRAcetam Stopped (11/16/21 2134)   linezolid (ZYVOX) IV Stopped (11/16/21 2213)   magnesium sulfate     metronidazole 500 mg (11/17/21 0755)   norepinephrine (LEVOPHED) Adult infusion 6 mcg/min (11/17/21 0600)   propofol (DIPRIVAN) infusion 30 mcg/kg/min (11/17/21 0440)   PRN Meds:.Place/Maintain arterial line **AND** sodium chloride, sodium chloride, [START ON 11/18/2021] acetaminophen **OR** [START ON 11/18/2021] acetaminophen (TYLENOL) oral liquid 160 mg/5 mL **OR** [START ON 11/18/2021] acetaminophen, albuterol, busPIRone **OR** busPIRone, fentaNYL, magnesium sulfate, midazolam, polyethylene glycol  SUBJECTIVE:   24 hour events:  No acute events noted overnight Developed some hypotension requiring resuming norepi up to 30mg Mild hypokalemia and magnesium EEG with left frontotemporal epileptiform foci, diffuse encephalopathy CSF cytology negative for malignancy No new imaging.  Intubated and sedated.   Review of Systems  Unable to perform ROS: Intubated     OBJECTIVE:   Blood pressure (!) 128/47, pulse 81, temperature 98.5 F (36.9 C), resp. rate (!) 30, height '5\' 2"'  (  1.575 m), weight 82.9 kg, SpO2 95 %. Body mass index is 33.43 kg/m.  Physical Exam Constitutional:      Comments: Patient intubated, sedated, non responsive.   HENT:     Head: Normocephalic and atraumatic.     Mouth/Throat:     Comments: ET Tube in place Cardiovascular:     Rate and Rhythm: Normal rate and regular rhythm.  Pulmonary:     Comments: Ventilated breath sounds, diminished at bases.  Abdominal:      General: There is no distension.     Palpations: Abdomen is soft.     Tenderness: There is no abdominal tenderness.  Musculoskeletal:     Cervical back: Normal range of motion and neck supple.     Comments: Left [place] CVC  Skin:    General: Skin is warm and dry.     Findings: No rash.  Neurological:     Comments: Obtunded.      Lab Results: Lab Results  Component Value Date   WBC 17.5 (H) 11/17/2021   HGB 9.7 (L) 11/17/2021   HCT 33.0 (L) 11/17/2021   MCV 92.4 11/17/2021   PLT 231 11/17/2021    Lab Results  Component Value Date   NA 135 11/17/2021   K 3.3 (L) 11/17/2021   CO2 24 11/17/2021   GLUCOSE 172 (H) 11/17/2021   BUN 17 11/17/2021   CREATININE 0.90 11/17/2021   CALCIUM 9.1 11/17/2021   GFRNONAA >60 11/17/2021   GFRAA >60 02/20/2019    Lab Results  Component Value Date   ALT 25 11/16/2021   AST 19 11/16/2021   ALKPHOS 202 (H) 11/16/2021   BILITOT 0.4 11/16/2021    No results found for: CRP  No results found for: ESRSEDRATE   I have reviewed the micro and lab results in Epic.  Imaging: DG CHEST PORT 1 VIEW  Result Date: 11/16/2021 CLINICAL DATA:  Brain abscess, altered mental status, hypoxemia, MRSA, cervical cancer, asthma, hypertension EXAM: PORTABLE CHEST 1 VIEW COMPARISON:  Portable exam 0728 hours compared to 11/13/2021 FINDINGS: Tip of endotracheal tube projects 4.8 cm above carina. Feeding tube extends into stomach. LEFT subclavian central venous catheter tip projects over SVC. Normal heart size and mediastinal contours. Atherosclerotic calcifications aorta. Small LEFT pleural effusion and mild LEFT basilar atelectasis. Remaining lungs clear. No pneumothorax. IMPRESSION: Small LEFT pleural effusion and LEFT basilar atelectasis. Aortic Atherosclerosis (ICD10-I70.0). Electronically Signed   By: Lavonia Dana M.D.   On: 11/16/2021 08:28   DG Abd Portable 1V  Result Date: 11/15/2021 CLINICAL DATA:  Encounter for feeding tube placement EXAM: PORTABLE ABDOMEN  - 1 VIEW COMPARISON:  None. FINDINGS: Enteric feeding tube tip overlies the distal stomach. Paucity of bowel gas. IMPRESSION: Enteric tube within the distal stomach. Electronically Signed   By: Macy Mis M.D.   On: 11/15/2021 11:13     Imaging independently reviewed in Epic.    [facility] [phone] pager 11/17/2021, 8:14 AM

## 2021-11-17 NOTE — Progress Notes (Signed)
NAME:  Kristen Murphy, MRN:  [id number], DOB:  [birth year], LOS: 5 ADMISSION DATE:  11/12/2021, CONSULTATION DATE: 11/12/2021 REFERRING MD: Dr. Langston Masker, CHIEF COMPLAINT: PEA arrest  History of Present Illness:   56 year old woman with a history of tobacco use, cervical cancer ([facility]), post nephrectomy and colostomy, CAD with LAD stent 01/2019, diabetes, depression, hypertension, remote CVA without known deficits.  Also with a history of substance abuse.  Brought in by her son to the ED 2/19 with fever, altered mental status and slurred speech. Apparently has had a flulike illness for a week, was found confused on 2/19.  Some suspicion by son that she may have been taking more prescription narcotics, also taking many NSAIDs per his report.  She was disoriented in the ED, febrile 103F, hyperglycemic. In the ED she apparently experienced acute hypoxemia, had some blood from her mouth, question emesis versus hemoptysis.  Devolved to PEA and required emergent ET intubation and CPR.  ROSC after 2 minutes.  OG tube placed with bright red blood obtained.  Chest x-ray with mild right hilar prominence, more notable on postintubation film.  No infiltrates or effusions She has received empiric antibiotics, is receiving 1 unit PRBC, is about to start Protonix infusion. Hemodynamically stabilized, mechanically ventilated.   Pertinent  Medical History   Past Medical History:  Diagnosis Date   Anemia    Anxiety    Arthritis    Asthma    Cervical cancer ([place])    Chronic kidney disease    Coronary artery disease    Depression    Diabetes mellitus without complication (HCC)    Dyspnea    Headache    Heart murmur    Hx of adenomatous polyp of colon    Hypertension    Rectovaginal fistula    Seizures ([place])    Stercoral ulcer of rectum 12/2020   Stroke [facility])     Significant Hospital Events: Including procedures, antibiotic start and stop dates in addition to other pertinent events   Head CT 2/19 > no  acute CT findings, old left basal ganglia lacunar, old left frontal cortical and subcortical infarct MR Brain 11/15/21 - Numerous lesions with mild associated hemorrhage. Mild edema.   Interim History / Subjective:   Overnight patient became hypotensive and required started on levophed. Given LR bolus  Objective   Blood pressure (!) 128/48, pulse 84, temperature 98.6 F (37 C), temperature source Bladder, resp. rate (!) 28, height 5\' 2"  (1.575 m), weight 82.9 kg, SpO2 96 %. CVP:  [0 mmHg-9 mmHg] 9 mmHg  Vent Mode: PRVC FiO2 (%):  [80 %] 80 % Set Rate:  [28 bmp] 28 bmp Vt Set:  [400 mL] 400 mL PEEP:  [12 cmH20] 12 cmH20 Plateau Pressure:  [19 cmH20-23 cmH20] 23 cmH20   Intake/Output Summary (Last 24 hours) at 11/17/2021 1008 Last data filed at 11/17/2021 0800 Gross per 24 hour  Intake 2661.2 ml  Output 2110 ml  Net 551.2 ml   Filed Weights   11/15/21 0500 11/16/21 0500 11/17/21 0500  Weight: 82.6 kg 83 kg 82.9 kg   Physical Exam: General: Critically ill-appearing, no acute distress HENT: [place], AT, ETT in place Eyes: EOMI, no scleral icterus Respiratory: Coarse breath sounds bilaterally.  No crackles, wheezing or rales Cardiovascular: RRR, -M/R/G, no JVD GI: BS+, soft, nontender Extremities:-Edema,-tenderness Neuro: Sedated. -cough/gag. Minimal PERRL  CTA 11/12/21 - RLL cavitary infiltrate CT A/P 11/12/21 - S/p colostomy, s/p right nephrectomy CXR 11/16/21 - Small left pleural effusion with  atelectasis  WBC improved to 17 ABG with adequate oxygenation  TTE - EF 65-70%. Grade I DD. Mild-mod mitral stenosis  Resolved Hospital Problem list     Assessment & Plan:   Multiple brain abscesses Bacterial meningitis Acute metabolic encephalopathy secondary to above Repeat CT head 2/19 neg for acute findings.  EEG and LTM neg for seizures.  Consider also substances as son concerned about possible narcotic use.  UDS only positive for benzo. MRI brain 2/21 numerous lesions with mild  associated hemorrhage and edema. Neurosurgery reviewed imaging and patient not a good candidate for lesion aspiration. -Continue antibiotics: Linezolid (2/22-), Cefepime and Flagyl -Appreciate Neuro recs -Appreciate ID recs -F/u LP studies, quantiferon TB, toxo, oligoclonal bands, IgG. Neg cryptococcal ag -F/u TEE and MRI liver for prior reported lesion -Droplet precautions -Contact precautions for MRSA  Acute respiratory failure with hypoxemia secondary to necrotizing MRSA pneumonia Also urine culture polymicrobial with 1k Enterobacter and 3K Pseudomona -Full vent support. Wean FIO2/PEEP. PRVC 8 cc/kg. Wean FIO2/PEEP for SpO2 goal 88-95%.  -Reviewed ABG. Wean FIO@ to 70% -VAP  -ABG and CXR this AM -Continue antibiotics -Lasix for goal net neg daily  Septic shock secondary  necrotizing MRSA pneumonia, bacterial meningitis - new shock 2/24 -Wean levophed for MAP goal >65 -LA normal -Appreciate ID recs  Acute cardiopulmonary arrest, PEA arrest -Defer TTM, CPR time 2 minutes, initiated immediately -Follow for any evidence of endorgan injury -Supportive care -Telemetry  Acute blood loss/chronic anemia - Hg stable History of NSAID abuse. EGD blood clots in gastric fundus/body, erosive gastropathy, no stigmata of bleeding but OG tube trauma in body -No history of alcohol, octreotide deferred -PPI BID -Trend CBC  Hypertension History of CAD -Holding amlodipine, metoprolol, lisinopril until stabilizing -Hold aspirin in setting of GI blood loss  Diabetes with hyperglycemia -CBGs, SSI if > 180 -Home metformin on hold -Home Farxiga on hold  Hyponatremia, hypokalemia Hypocalcemia - corrected to wnl in setting hypoalbuminemia - Replete K and Mg  Best Practice (right click and "Reselect all SmartList Selections" daily)   Diet/type: tubefeeds DVT prophylaxis: prophylactic heparin  GI prophylaxis: PPI Lines: Central line Foley:  N/A Code Status:  full code Last date of  multidisciplinary goals of care discussion [Updated son on 2/20]  Labs   CBC: Recent Labs  Lab 11/12/21 1508 11/12/21 1514 11/13/21 0228 11/13/21 0353 11/14/21 0315 11/14/21 0826 11/15/21 0449 11/16/21 0415 11/16/21 0749 11/16/21 1327 11/17/21 0104 11/17/21 0904  WBC 18.6*  --  30.2*  --  26.7*  --  22.4* 17.6*  --   --  17.5*  --   NEUTROABS 16.7*  --   --   --   --   --   --   --   --   --   --   --   HGB 8.9*   < > 10.7*   < > 10.1*   < > 10.0* 9.2* 12.9 10.5* 9.7* 10.2*  HCT 28.9*   < > 35.1*   < > 32.8*   < > 32.6* 30.8* 38.0 31.0* 33.0* 30.0*  MCV 87.0  --  90.0  --  90.4  --  91.3 92.2  --   --  92.4  --   PLT 251  --  214  --  181  --  211 226  --   --  231  --    < > = values in this interval not displayed.    Basic abnormalities panel: Recent Labs  Lab 11/12/21 1931 11/12/21 1946 11/14/21 0824 11/14/21 0826 11/14/21 1400 11/14/21 1700 11/14/21 1838 11/15/21 0449 11/15/21 1700 11/16/21 0415 11/16/21 0749 11/16/21 1327 11/16/21 2124 11/17/21 0104 11/17/21 0904  NA  --    < >  --    < > 132*  --    < > 132*  --  134* 136 134* 134* 135 138  K  --    < >  --    < > 3.9  --    < > 3.9  --  3.4* 3.7 3.5 3.3* 3.3* 3.7  CL  --    < >  --   --  99  --   --  99  --  102  --   --  101 102  --   CO2  --    < >  --   --  22  --   --  22  --  23  --   --  24 24  --   GLUCOSE  --    < >  --   --  120*  --   --  131*  --  139*  --   --  128* 172*  --   BUN  --    < >  --   --  13  --   --  15  --  15  --   --  17 17  --   CREATININE  --    < >  --   --  0.98  --   --  0.87  --  0.87  --   --  0.84 0.90  --   CALCIUM  --    < >  --   --  7.7*  --   --  8.5*  --  8.7*  --   --  8.9 9.1  --   MG 2.0  --  1.7  --   --  1.6*  --  2.0 1.9  --   --   --   --  1.9  --   PHOS 5.7*  --  3.7  --   --  2.8  --  2.7 2.4*  --   --   --   --   --   --    < > = values in this interval not displayed.   GFR: Estimated Creatinine Clearance: 69.6 mL/min (by C-G formula based on SCr of  0.9 mg/dL). Recent Labs  Lab 11/12/21 1931 11/12/21 2233 11/13/21 0228 11/14/21 0315 11/14/21 0824 11/15/21 0449 11/16/21 0415 11/17/21 0104 11/17/21 0905  PROCALCITON 2.04  --   --   --   --   --   --   --   --   WBC  --   --    < > 26.7*  --  22.4* 17.6* 17.5*  --   LATICACIDVEN 2.5* 1.9  --   --  1.2  --   --   --  1.1   < > = values in this interval not displayed.    Liver Function Tests: Recent Labs  Lab 11/12/21 1508 11/13/21 0228 11/16/21 0415  AST 12* 84* 19  ALT 12 51* 25  ALKPHOS 58 65 202*  BILITOT 0.3 0.2* 0.4  PROT 7.0 6.4* 5.4*  ALBUMIN 2.5* 2.3* <1.5*   No results for input(s): LIPASE, AMYLASE in the last 168 hours. No results for input(s): AMMONIA in the last 168 hours.  ABG    Component Value Date/Time   PHART 7.263 (L) 11/17/2021 0904   PCO2ART 57.9 (H) 11/17/2021 0904   PO2ART 81 (L) 11/17/2021 0904   HCO3 26.2 11/17/2021 0904   TCO2 28 11/17/2021 0904   ACIDBASEDEF 1.0 11/17/2021 0904   O2SAT 94 11/17/2021 0904     Coagulation Profile: Recent Labs  Lab 11/12/21 1508  INR 1.2    Cardiac Enzymes: No results for input(s): CKTOTAL, CKMB, CKMBINDEX, TROPONINI in the last 168 hours.  HbA1C: Hgb A1c MFr Bld  Date/Time Value Ref Range Status  11/12/2021 07:31 PM 7.2 (H) 4.8 - 5.6 % Final    Comment:    (NOTE) Pre diabetes:          5.7%-6.4%  Diabetes:              >6.4%  Glycemic control for   <7.0% adults with diabetes   02/19/2019 03:32 AM 9.2 (H) 4.8 - 5.6 % Final    Comment:    (NOTE) Pre diabetes:          5.7%-6.4% Diabetes:              >6.4% Glycemic control for   <7.0% adults with diabetes     CBG: Recent Labs  Lab 11/16/21 1732 11/16/21 2046 11/17/21 0000 11/17/21 0403 11/17/21 0730  GLUCAP 97 109* 158* 132* 101*   Critical care time: 50 minutes   The patient is critically ill with multiple organ systems failure and requires high complexity decision making for assessment and support, frequent evaluation  and titration of therapies, application of advanced monitoring technologies and extensive interpretation of multiple databases.  Independent Critical Care Time: 50 Minutes.   Rodman Pickle, M.D. [facility] Pulmonary/Critical Care Medicine 11/17/2021 10:08 AM   Please see Amion for pager number to reach on-call Pulmonary and Critical Care Team.

## 2021-11-17 NOTE — Progress Notes (Signed)
Subjective: Patient is lying in bed on vent in NAD on 100 mcg Fentanyl and 30 mcg propofol obtunded and nonresponsive. Has not required Versed since yesterday at noon. ICU RN at bedside. No family at bedside. Slight cough noted by RN but otherwise unchanged from yesterday.   Objective: Current vital signs: BP (!) 128/47    Pulse 81    Temp 98.5 F (36.9 C)    Resp (!) 30    Ht _0  (1.575 m)    Wt 82.9 kg    SpO2 95%    BMI 33.43 kg/m  Vital signs in last 24 hours: Temp:  [98.5 F (36.9 C)-98.9 F (37.2 C)] 98.5 F (36.9 C) (02/24 0600) Pulse Rate:  [81-116] 81 (02/24 0729) Resp:  [14-36] 30 (02/24 0729) BP: (105-156)/(38-59) 128/47 (02/24 0729) SpO2:  [89 %-96 %] 95 % (02/24 0729) Arterial Line BP: (98-162)/(43-82) 121/45 (02/24 0645) FiO2 (%):  [80 %] 80 % (02/24 0729) Weight:  [82.9 kg] 82.9 kg (02/24 0500)  Intake/Output from previous day: 02/23 0701 - 02/24 0700 In: 3099 [I.V.:757; NG/GT:240; IV Piggyback:2102] Out: 2850 [Urine:2750; Emesis/NG output:100] Intake/Output this shift: No intake/output data recorded. Nutritional status:  Diet Order             Diet NPO time specified  Diet effective midnight                   Neurologic Exam: Ment: Patient under heavy sedation (propofol 30, fentanyl 100) has eyes closed, does not open eyes, is obtunded and non responsive to noxious stimuli. Patient does not follow commands.  CN: Pupils are pinpoint with upward, disconjugate gaze. No corneal reflex noted, no blink to threat, negative dolls eyes. Cough but no gag. Is breathing over ventilator.  Motor/Sensory: All 4 extremities are are flaccid with no movement to noxious stimuli (on heavy sedation)  Lab Results: Results for orders placed or performed during the hospital encounter of 11/12/21 (from the past 48 hour(s))  Glucose, capillary     Status: Abnormal   Collection Time: 11/15/21  9:09 AM  Result Value Ref Range   Glucose-Capillary 130 (H) 70 - 99 mg/dL     Comment: Glucose reference range applies only to samples taken after fasting for at least 8 hours.  Acid Fast Smear (AFB)     Status: None   Collection Time: 11/15/21  9:23 AM   Specimen: CSF; Cerebrospinal Fluid  Result Value Ref Range   AFB Specimen Processing Direct Inoculation    Acid Fast Smear Negative     Comment: (NOTE) Performed At: [facility] [place], [place] [id number] Rush Farmer MD [id number]    Source (AFB) CSF     Comment: Performed at [facility], [place] [address] [place], [place] [address]  Culture, fungus without smear     Status: None (Preliminary result)   Collection Time: 11/15/21  9:24 AM   Specimen: CSF; Other  Result Value Ref Range   Specimen Description CSF    Special Requests Normal    Culture      NO FUNGUS ISOLATED AFTER 1 DAY Performed at [facility] [address], [place], [place] [address]    Report Status PENDING   Glucose, capillary     Status: Abnormal   Collection Time: 11/15/21 11:14 AM  Result Value Ref Range   Glucose-Capillary 123 (H) 70 - 99 mg/dL    Comment: Glucose reference range applies only to samples taken after  fasting for at least 8 hours.  Glucose, capillary     Status: Abnormal   Collection Time: 11/15/21  4:31 PM  Result Value Ref Range   Glucose-Capillary 123 (H) 70 - 99 mg/dL    Comment: Glucose reference range applies only to samples taken after fasting for at least 8 hours.  Magnesium     Status: None   Collection Time: 11/15/21  5:00 PM  Result Value Ref Range   Magnesium 1.9 1.7 - 2.4 mg/dL    Comment: Performed at [facility], [place] [address], [place], [place] [address]  Phosphorus     Status: Abnormal   Collection Time: 11/15/21  5:00 PM  Result Value Ref Range   Phosphorus 2.4 (L) 2.5 - 4.6 mg/dL    Comment: Performed at [facility] [address], [place], [place] [address]  Glucose, capillary     Status: Abnormal   Collection Time: 11/15/21  8:24 PM   Result Value Ref Range   Glucose-Capillary 114 (H) 70 - 99 mg/dL    Comment: Glucose reference range applies only to samples taken after fasting for at least 8 hours.  Glucose, capillary     Status: Abnormal   Collection Time: 11/15/21 11:53 PM  Result Value Ref Range   Glucose-Capillary 133 (H) 70 - 99 mg/dL    Comment: Glucose reference range applies only to samples taken after fasting for at least 8 hours.  Glucose, capillary     Status: Abnormal   Collection Time: 11/16/21  3:50 AM  Result Value Ref Range   Glucose-Capillary 142 (H) 70 - 99 mg/dL    Comment: Glucose reference range applies only to samples taken after fasting for at least 8 hours.  Triglycerides     Status: Abnormal   Collection Time: 11/16/21  4:15 AM  Result Value Ref Range   Triglycerides 447 (H) <150 mg/dL    Comment: Performed at [facility] [address], [place], [place] [address]  CBC     Status: Abnormal   Collection Time: 11/16/21  4:15 AM  Result Value Ref Range   WBC 17.6 (H) 4.0 - 10.5 K/uL   RBC 3.34 (L) 3.87 - 5.11 MIL/uL   Hemoglobin 9.2 (L) 12.0 - 15.0 g/dL   HCT 30.8 (L) 36.0 - 46.0 %   MCV 92.2 80.0 - 100.0 fL   MCH 27.5 26.0 - 34.0 pg   MCHC 29.9 (L) 30.0 - 36.0 g/dL   RDW 16.7 (H) 11.5 - 15.5 %   Platelets 226 150 - 400 K/uL   nRBC 0.4 (H) 0.0 - 0.2 %    Comment: Performed at [facility] [address], [place], [place] [address]  Basic metabolic panel     Status: Abnormal   Collection Time: 11/16/21  4:15 AM  Result Value Ref Range   Sodium 134 (L) 135 - 145 mmol/L   Potassium 3.4 (L) 3.5 - 5.1 mmol/L   Chloride 102 98 - 111 mmol/L   CO2 23 22 - 32 mmol/L   Glucose, Bld 139 (H) 70 - 99 mg/dL    Comment: Glucose reference range applies only to samples taken after fasting for at least 8 hours.   BUN 15 6 - 20 mg/dL   Creatinine, Ser 0.87 0.44 - 1.00 mg/dL   Calcium 8.7 (L) 8.9 - 10.3 mg/dL   GFR, Estimated >60 >60 mL/min    Comment: (NOTE) Calculated using the  CKD-EPI Creatinine Equation (  2021)    Anion gap 9 5 - 15    Comment: Performed at [facility], [place] [address], [place], [place] [address]  Hepatic function panel     Status: Abnormal   Collection Time: 11/16/21  4:15 AM  Result Value Ref Range   Total Protein 5.4 (L) 6.5 - 8.1 g/dL   Albumin <1.5 (L) 3.5 - 5.0 g/dL   AST 19 15 - 41 U/L   ALT 25 0 - 44 U/L   Alkaline Phosphatase 202 (H) 38 - 126 U/L   Total Bilirubin 0.4 0.3 - 1.2 mg/dL   Bilirubin, Direct 0.2 0.0 - 0.2 mg/dL   Indirect Bilirubin 0.2 (L) 0.3 - 0.9 mg/dL    Comment: Performed at [facility] [address], [place], [place] [address]  Troponin I (High Sensitivity)     Status: Abnormal   Collection Time: 11/16/21  4:15 AM  Result Value Ref Range   Troponin I (High Sensitivity) 33 (H) <18 ng/L    Comment: (NOTE) Elevated high sensitivity troponin I (hsTnI) values and significant  changes across serial measurements may suggest ACS but many other  chronic and acute conditions are known to elevate hsTnI results.  Refer to the Links section for chest pain algorithms and additional  guidance. Performed at [facility], [place] [address] [place], [place] [address]   I-STAT 7, (LYTES, BLD GAS, ICA, H+H)     Status: Abnormal   Collection Time: 11/16/21  7:49 AM  Result Value Ref Range   pH, Arterial 7.269 (L) 7.35 - 7.45   pCO2 arterial 54.0 (H) 32 - 48 mmHg   pO2, Arterial 62 (L) 83 - 108 mmHg   Bicarbonate 24.8 20.0 - 28.0 mmol/L   TCO2 26 22 - 32 mmol/L   O2 Saturation 88 %   Acid-base deficit 3.0 (H) 0.0 - 2.0 mmol/L   Sodium 136 135 - 145 mmol/L   Potassium 3.7 3.5 - 5.1 mmol/L   Calcium, Ion 1.35 1.15 - 1.40 mmol/L   HCT 38.0 36.0 - 46.0 %   Hemoglobin 12.9 12.0 - 15.0 g/dL   Patient temperature 98.0 F    Sample type ARTERIAL   Glucose, capillary     Status: Abnormal   Collection Time: 11/16/21  8:07 AM  Result Value Ref Range   Glucose-Capillary 101 (H) 70 - 99 mg/dL    Comment: Glucose  reference range applies only to samples taken after fasting for at least 8 hours.  Troponin I (High Sensitivity)     Status: Abnormal   Collection Time: 11/16/21 11:01 AM  Result Value Ref Range   Troponin I (High Sensitivity) 18 (H) <18 ng/L    Comment: (NOTE) Elevated high sensitivity troponin I (hsTnI) values and significant  changes across serial measurements may suggest ACS but many other  chronic and acute conditions are known to elevate hsTnI results.  Refer to the "Links" section for chest pain algorithms and additional  guidance. Performed at [facility], [place] [address] [place], [place] [address]   Glucose, capillary     Status: Abnormal   Collection Time: 11/16/21 12:23 PM  Result Value Ref Range   Glucose-Capillary 159 (H) 70 - 99 mg/dL    Comment: Glucose reference range applies only to samples taken after fasting for at least 8 hours.  I-STAT 7, (LYTES, BLD GAS, ICA, H+H)     Status: Abnormal   Collection Time: 11/16/21  1:27 PM  Result Value Ref Range   pH, Arterial 7.289 (L) 7.35 - 7.45   pCO2 arterial 55.3 (H) 32 - 48 mmHg   pO2, Arterial 73 (L) 83 - 108 mmHg   Bicarbonate 26.6 20.0 - 28.0 mmol/L   TCO2 28 22 - 32 mmol/L   O2 Saturation 92 %   Acid-base deficit 1.0 0.0 - 2.0 mmol/L   Sodium 134 (L) 135 - 145 mmol/L   Potassium 3.5 3.5 - 5.1 mmol/L   Calcium, Ion 1.35 1.15 - 1.40 mmol/L   HCT 31.0 (L) 36.0 - 46.0 %   Hemoglobin 10.5 (L) 12.0 - 15.0 g/dL   Patient temperature 98.0 F    Sample type ARTERIAL   Glucose, capillary     Status: None   Collection Time: 11/16/21  5:32 PM  Result Value Ref Range   Glucose-Capillary 97 70 - 99 mg/dL    Comment: Glucose reference range applies only to samples taken after fasting for at least 8 hours.  Glucose, capillary     Status: Abnormal   Collection Time: 11/16/21  8:46 PM  Result Value Ref Range   Glucose-Capillary 109 (H) 70 - 99 mg/dL    Comment: Glucose reference range applies only to samples taken  after fasting for at least 8 hours.  Basic metabolic panel     Status: Abnormal   Collection Time: 11/16/21  9:24 PM  Result Value Ref Range   Sodium 134 (L) 135 - 145 mmol/L   Potassium 3.3 (L) 3.5 - 5.1 mmol/L   Chloride 101 98 - 111 mmol/L   CO2 24 22 - 32 mmol/L   Glucose, Bld 128 (H) 70 - 99 mg/dL    Comment: Glucose reference range applies only to samples taken after fasting for at least 8 hours.   BUN 17 6 - 20 mg/dL   Creatinine, Ser 0.84 0.44 - 1.00 mg/dL   Calcium 8.9 8.9 - 10.3 mg/dL   GFR, Estimated >60 >60 mL/min    Comment: (NOTE) Calculated using the CKD-EPI Creatinine Equation (2021)    Anion gap 9 5 - 15    Comment: Performed at [facility] [address] [place], [place] [address]  Glucose, capillary     Status: Abnormal   Collection Time: 11/17/21 12:00 AM  Result Value Ref Range   Glucose-Capillary 158 (H) 70 - 99 mg/dL    Comment: Glucose reference range applies only to samples taken after fasting for at least 8 hours.  CBC     Status: Abnormal   Collection Time: 11/17/21  1:04 AM  Result Value Ref Range   WBC 17.5 (H) 4.0 - 10.5 K/uL   RBC 3.57 (L) 3.87 - 5.11 MIL/uL   Hemoglobin 9.7 (L) 12.0 - 15.0 g/dL   HCT 33.0 (L) 36.0 - 46.0 %   MCV 92.4 80.0 - 100.0 fL   MCH 27.2 26.0 - 34.0 pg   MCHC 29.4 (L) 30.0 - 36.0 g/dL   RDW 16.6 (H) 11.5 - 15.5 %   Platelets 231 150 - 400 K/uL   nRBC 0.9 (H) 0.0 - 0.2 %    Comment: Performed at [facility], [place] [address], [place], [place] [address]  Basic metabolic panel     Status: Abnormal   Collection Time: 11/17/21  1:04 AM  Result Value Ref Range   Sodium 135 135 - 145 mmol/L   Potassium 3.3 (L) 3.5 - 5.1 mmol/L   Chloride 102 98 - 111 mmol/L  CO2 24 22 - 32 mmol/L   Glucose, Bld 172 (H) 70 - 99 mg/dL    Comment: Glucose reference range applies only to samples taken after fasting for at least 8 hours.   BUN 17 6 - 20 mg/dL   Creatinine, Ser 0.90 0.44 - 1.00 mg/dL   Calcium 9.1 8.9 - 10.3  mg/dL   GFR, Estimated >60 >60 mL/min    Comment: (NOTE) Calculated using the CKD-EPI Creatinine Equation (2021)    Anion gap 9 5 - 15    Comment: Performed at [facility] [address], [place], [place] [address]  Magnesium     Status: None   Collection Time: 11/17/21  1:04 AM  Result Value Ref Range   Magnesium 1.9 1.7 - 2.4 mg/dL    Comment: Performed at [facility] [address], [place], [place] [address]  Glucose, capillary     Status: Abnormal   Collection Time: 11/17/21  4:03 AM  Result Value Ref Range   Glucose-Capillary 132 (H) 70 - 99 mg/dL    Comment: Glucose reference range applies only to samples taken after fasting for at least 8 hours.  Glucose, capillary     Status: Abnormal   Collection Time: 11/17/21  7:30 AM  Result Value Ref Range   Glucose-Capillary 101 (H) 70 - 99 mg/dL    Comment: Glucose reference range applies only to samples taken after fasting for at least 8 hours.    Recent Results (from the past 240 hour(s))  Urine Culture     Status: Abnormal   Collection Time: 11/12/21  3:36 PM   Specimen: In/Out Cath Urine  Result Value Ref Range Status   Specimen Description IN/OUT CATH URINE  Final   Special Requests   Final    NONE Performed at [facility], [address], [place], [place] [address]    Culture (A)  Final    1,000 COLONIES/mL ENTEROBACTER CLOACAE 3,000 COLONIES/mL PSEUDOMONAS AERUGINOSA    Report Status 11/15/2021 FINAL  Final   Organism ID, Bacteria ENTEROBACTER CLOACAE (A)  Final   Organism ID, Bacteria PSEUDOMONAS AERUGINOSA (A)  Final      Susceptibility   Enterobacter cloacae - MIC*    CEFAZOLIN >=64 RESISTANT Resistant     CEFEPIME <=0.12 SENSITIVE Sensitive     CIPROFLOXACIN <=0.25 SENSITIVE Sensitive     GENTAMICIN <=1 SENSITIVE Sensitive     IMIPENEM <=0.25 SENSITIVE Sensitive     NITROFURANTOIN 32 SENSITIVE Sensitive     TRIMETH/SULFA <=20 SENSITIVE Sensitive     PIP/TAZO <=4 SENSITIVE Sensitive      * 1,000 COLONIES/mL ENTEROBACTER CLOACAE   Pseudomonas aeruginosa - MIC*    CEFTAZIDIME 4 SENSITIVE Sensitive     CIPROFLOXACIN <=0.25 SENSITIVE Sensitive     GENTAMICIN <=1 SENSITIVE Sensitive     IMIPENEM 2 SENSITIVE Sensitive     PIP/TAZO 8 SENSITIVE Sensitive     CEFEPIME 2 SENSITIVE Sensitive     * 3,000 COLONIES/mL PSEUDOMONAS AERUGINOSA  Respiratory (~20 pathogens) panel by PCR     Status: None   Collection Time: 11/12/21  3:37 PM   Specimen: Nasopharyngeal Swab; Respiratory  Result Value Ref Range Status   Adenovirus NOT DETECTED NOT DETECTED Final   Coronavirus 229E NOT DETECTED NOT DETECTED Final    Comment: (NOTE) The Coronavirus on the Respiratory Panel, DOES NOT test for the novel  Coronavirus (2019 nCoV)    Coronavirus HKU1 NOT DETECTED NOT DETECTED Final  Coronavirus NL63 NOT DETECTED NOT DETECTED Final   Coronavirus OC43 NOT DETECTED NOT DETECTED Final   Metapneumovirus NOT DETECTED NOT DETECTED Final   Rhinovirus / Enterovirus NOT DETECTED NOT DETECTED Final   Influenza A NOT DETECTED NOT DETECTED Final   Influenza B NOT DETECTED NOT DETECTED Final   Parainfluenza Virus 1 NOT DETECTED NOT DETECTED Final   Parainfluenza Virus 2 NOT DETECTED NOT DETECTED Final   Parainfluenza Virus 3 NOT DETECTED NOT DETECTED Final   Parainfluenza Virus 4 NOT DETECTED NOT DETECTED Final   Respiratory Syncytial Virus NOT DETECTED NOT DETECTED Final   Bordetella pertussis NOT DETECTED NOT DETECTED Final   Bordetella Parapertussis NOT DETECTED NOT DETECTED Final   Chlamydophila pneumoniae NOT DETECTED NOT DETECTED Final   Mycoplasma pneumoniae NOT DETECTED NOT DETECTED Final    Comment: Performed at [facility], [place] [address] [place], [place] [address]  Blood Culture (routine x 2)     Status: None (Preliminary result)   Collection Time: 11/12/21  4:23 PM   Specimen: Site Not Specified; Blood  Result Value Ref Range Status   Specimen Description SITE NOT SPECIFIED   Final   Special Requests   Final    BOTTLES DRAWN AEROBIC AND ANAEROBIC Blood Culture adequate volume   Culture   Final    NO GROWTH 4 DAYS Performed at [facility], [place] [address] [place], [place] [address]    Report Status PENDING  Incomplete  Resp Panel by RT-PCR (Flu A&B, Covid) Nasopharyngeal Swab     Status: None   Collection Time: 11/12/21  4:25 PM   Specimen: Nasopharyngeal Swab; Nasopharyngeal(NP) swabs in vial transport medium  Result Value Ref Range Status   SARS Coronavirus 2 by RT PCR NEGATIVE NEGATIVE Final    Comment: (NOTE) SARS-CoV-2 target nucleic acids are NOT DETECTED.  The SARS-CoV-2 RNA is generally detectable in upper respiratory specimens during the acute phase of infection. The lowest concentration of SARS-CoV-2 viral copies this assay can detect is 138 copies/mL. A negative result does not preclude SARS-Cov-2 infection and should not be used as the sole basis for treatment or other patient management decisions. A negative result may occur with  improper specimen collection/handling, submission of specimen other than nasopharyngeal swab, presence of viral mutation(s) within the areas targeted by this assay, and inadequate number of viral copies(<138 copies/mL). A negative result must be combined with clinical observations, patient history, and epidemiological information. The expected result is Negative.  Fact Sheet for Patients:  EntrepreneurPulse.com.au  Fact Sheet for Healthcare Providers:  IncredibleEmployment.be  This test is no t yet approved or cleared by the [place] FDA and  has been authorized for detection and/or diagnosis of SARS-CoV-2 by FDA under an Emergency Use Authorization (EUA). This EUA will remain  in effect (meaning this test can be used) for the duration of the COVID-19 declaration under Section 564(b)(1) of the Act, 21 U.S.C.section 360bbb-3(b)(1), unless the authorization is  terminated  or revoked sooner.       Influenza A by PCR NEGATIVE NEGATIVE Final   Influenza B by PCR NEGATIVE NEGATIVE Final    Comment: (NOTE) The Xpert Xpress SARS-CoV-2/FLU/RSV plus assay is intended as an aid in the diagnosis of influenza from Nasopharyngeal swab specimens and should not be used as a sole basis for treatment. Nasal washings and aspirates are unacceptable for Xpert Xpress SARS-CoV-2/FLU/RSV testing.  Fact Sheet for Patients: EntrepreneurPulse.com.au  Fact Sheet for Healthcare Providers: IncredibleEmployment.be  This test is  not yet approved or cleared by the [place] and has been authorized for detection and/or diagnosis of SARS-CoV-2 by FDA under an Emergency Use Authorization (EUA). This EUA will remain in effect (meaning this test can be used) for the duration of the COVID-19 declaration under Section 564(b)(1) of the Act, 21 U.S.C. section 360bbb-3(b)(1), unless the authorization is terminated or revoked.  Performed at [facility], [place] [address] [place], [place] [address]   Blood Culture (routine x 2)     Status: None (Preliminary result)   Collection Time: 11/12/21  4:44 PM   Specimen: Site Not Specified; Blood  Result Value Ref Range Status   Specimen Description SITE NOT SPECIFIED  Final   Special Requests   Final    BOTTLES DRAWN AEROBIC AND ANAEROBIC Blood Culture results may not be optimal due to an excessive volume of blood received in culture bottles   Culture   Final    NO GROWTH 4 DAYS Performed at [facility], [place] [address] [place], [place] [address]    Report Status PENDING  Incomplete  MRSA Next Gen by PCR, Nasal     Status: Abnormal   Collection Time: 11/12/21  7:36 PM   Specimen: Nasal Mucosa; Nasal Swab  Result Value Ref Range Status   MRSA by PCR Next Gen DETECTED (A) NOT DETECTED Final    Comment: RESULT CALLED TO, READ BACK BY AND VERIFIED WITH: V [email or web address]   11/12/21 [place] (NOTE) The GeneXpert MRSA Assay (FDA approved for NASAL specimens only), is one component of a comprehensive MRSA colonization surveillance program. It is not intended to diagnose MRSA infection nor to guide or monitor treatment for MRSA infections. Test performance is not FDA approved in patients less than 36 years old. Performed at [facility], [place] [address] [place], [place] [address]   Culture, Respiratory w Gram Stain     Status: None   Collection Time: 11/13/21  2:09 AM   Specimen: Bronchoalveolar Lavage; Respiratory  Result Value Ref Range Status   Specimen Description BRONCHIAL ALVEOLAR LAVAGE  Final   Special Requests NONE  Final   Gram Stain   Final    MODERATE WBC PRESENT, PREDOMINANTLY PMN FEW GRAM POSITIVE COCCI IN PAIRS AND CHAINS Performed at [facility], [address] [place], [place] [address]    Culture FEW METHICILLIN RESISTANT STAPHYLOCOCCUS AUREUS  Final   Report Status 11/15/2021 FINAL  Final   Organism ID, Bacteria METHICILLIN RESISTANT STAPHYLOCOCCUS AUREUS  Final      Susceptibility   Methicillin resistant staphylococcus aureus - MIC*    CIPROFLOXACIN >=8 RESISTANT Resistant     ERYTHROMYCIN >=8 RESISTANT Resistant     GENTAMICIN <=0.5 SENSITIVE Sensitive     OXACILLIN >=4 RESISTANT Resistant     TETRACYCLINE <=1 SENSITIVE Sensitive     VANCOMYCIN 1 SENSITIVE Sensitive     TRIMETH/SULFA <=10 SENSITIVE Sensitive     CLINDAMYCIN <=0.25 SENSITIVE Sensitive     RIFAMPIN <=0.5 SENSITIVE Sensitive     Inducible Clindamycin NEGATIVE Sensitive     * FEW METHICILLIN RESISTANT STAPHYLOCOCCUS AUREUS  CSF culture w Stat Gram Stain     Status: None (Preliminary result)   Collection Time: 11/15/21  8:02 AM   Specimen: CSF; Cerebrospinal Fluid  Result Value Ref Range Status   Specimen Description CSF  Final   Special Requests NONE  Final   Gram Stain   Final    WBC PRESENT,BOTH PMN AND MONONUCLEAR  NO ORGANISMS SEEN CYTOSPIN SMEAR     Culture   Final    NO GROWTH 1 DAY Performed at [facility], [place] [address], [place], [place] [address]    Report Status PENDING  Incomplete  Acid Fast Smear (AFB)     Status: None   Collection Time: 11/15/21  9:23 AM   Specimen: CSF; Cerebrospinal Fluid  Result Value Ref Range Status   AFB Specimen Processing Direct Inoculation  Final   Acid Fast Smear Negative  Final    Comment: (NOTE) Performed At: [facility] [place], [place] [id number] Rush Farmer MD [id number]    Source (AFB) CSF  Final    Comment: Performed at [facility], [place] [address], [place], [place] [address]  Culture, fungus without smear     Status: None (Preliminary result)   Collection Time: 11/15/21  9:24 AM   Specimen: CSF; Other  Result Value Ref Range Status   Specimen Description CSF  Final   Special Requests Normal  Final   Culture   Final    NO FUNGUS ISOLATED AFTER 1 DAY Performed at [facility], [address] [place], [place] [address]    Report Status PENDING  Incomplete    Lipid Panel Recent Labs    11/16/21 0415  TRIG [id number]*     Studies/Results: DG CHEST PORT 1 VIEW  Result Date: 11/16/2021 CLINICAL DATA:  Brain abscess, altered mental status, hypoxemia, MRSA, cervical cancer, asthma, hypertension EXAM: PORTABLE CHEST 1 VIEW COMPARISON:  Portable exam 0728 hours compared to 11/13/2021 FINDINGS: Tip of endotracheal tube projects 4.8 cm above carina. Feeding tube extends into stomach. LEFT subclavian central venous catheter tip projects over SVC. Normal heart size and mediastinal contours. Atherosclerotic calcifications aorta. Small LEFT pleural effusion and mild LEFT basilar atelectasis. Remaining lungs clear. No pneumothorax. IMPRESSION: Small LEFT pleural effusion and LEFT basilar atelectasis. Aortic Atherosclerosis (ICD10-I70.0). Electronically Signed   By: Lavonia Dana M.D.   On: 11/16/2021 08:28   DG Abd Portable 1V  Result Date:  11/15/2021 CLINICAL DATA:  Encounter for feeding tube placement EXAM: PORTABLE ABDOMEN - 1 VIEW COMPARISON:  None. FINDINGS: Enteric feeding tube tip overlies the distal stomach. Paucity of bowel gas. IMPRESSION: Enteric tube within the distal stomach. Electronically Signed   By: Macy Mis M.D.   On: 11/15/2021 11:13    Medications: Scheduled:  acetaminophen  650 mg Oral Q4H   Or   acetaminophen (TYLENOL) oral liquid 160 mg/5 mL  650 mg Per Tube Q4H   Or   acetaminophen  650 mg Rectal Q4H   chlorhexidine gluconate (MEDLINE KIT)  15 mL Mouth Rinse BID   Chlorhexidine Gluconate Cloth  6 each Topical Daily   docusate  100 mg Per Tube BID   fentaNYL (SUBLIMAZE) injection  50 mcg Intravenous Once   heparin injection (subcutaneous)  5,000 Units Subcutaneous Q8H   insulin aspart  0-9 Units Subcutaneous Q4H   insulin aspart  3 Units Subcutaneous Q4H   mouth rinse  15 mL Mouth Rinse 10 times per day   pantoprazole  40 mg Intravenous Q12H   polyethylene glycol  17 g Per Tube Daily   sodium chloride flush  3 mL Intravenous Once   Continuous:  sodium chloride     sodium chloride 5 mL/hr at 11/15/21 0803   sodium chloride     ceFEPime (MAXIPIME) IV Stopped (11/17/21 0201)   feeding supplement (VITAL AF 1.2 CAL)  fentaNYL infusion INTRAVENOUS 100 mcg/hr (11/17/21 0600)   levETIRAcetam 1,500 mg (11/17/21 0828)   linezolid (ZYVOX) IV Stopped (11/16/21 2213)   magnesium sulfate     metronidazole 500 mg (11/17/21 0755)   norepinephrine (LEVOPHED) Adult infusion 7 mcg/min (11/17/21 0826)   propofol (DIPRIVAN) infusion 30 mcg/kg/min (11/17/21 0827)   Assessment: 56 year old woman with a history of cervical cancer, status post nephrectomy 2 months ago, colostomy, chronic occlusion of the left common carotid artery, CAD, diabetes, depression, hypertension, remote history of stroke without residual deficits who was brought in by EMS for altered mental status. She had had a flulike illness for a  week PTA and was found confused on 2/19. The patient had acute PEA arrest while in the ED on Sunday (2/19) with CPR time of 2 minutes. Diagnosed with acute respiratory failure with hypoxemia and was intubated at that time. MRI brain performed on Tuesday revealed numerous (> 50) widely distributed lesions in the cerebral hemispheres as well as the cerebellum.  - Neurological exam on sedation is with minimal responses today (overall rates of sedating drips is higher than yesterday). Overbreathing the vent. On Fentanyl and Propofol gtts.  - MRI brain with and without contrast: Numerous (> 50) small, round DWI+ lesions are scattered throughout the brain parenchyma, infratentorially and supratentorially, some with ring-like morphologies. On FLAIR images, several of the lesions exhibit adjacent vasogenic edema. Some of the lesions exhibit corresponding T1-hypointensity. A small subset of the lesions exhibit enhancement on the post-contrast images. Many lesions show evidence of mild associated hemorrhage. Restricted diffusion in the left lateral ventricle likely due to ventriculitis. 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 DDx for underlying pathogen includes bacterial (including tuberculosis), fungal (histoplasmosis, aspergillosis), protozoan (toxoplasmosis), parasitic (neurocysticercosis). Lower on the DDx would be an atypical presentation of CNS inflammatory disease such as MS or CNS lupus, or metastatic disease.    - Acute GI bleed, most recent hemoglobin 10.2>12.9.  History of NSAID abuse.  - EEG: evidence of epileptogenicity arising from left frontotemporal region.  Additionally study is suggestive of severe diffuse encephalopathy, nonspecific etiology.  No seizures were seen throughout the recordin -WBC 22.4 > 17.6, continues to be tachycardic -CSF from LP showed: cell count markedly abnormal with WBC 585 of which 84% were segmented  neutrophils. Glucose 21 (low), Total protein 191 (high). Consistent with bacterial meningitis. -Cryptococcal antigen negative, Acid Fast Smear negative, remainder of infectious labs In process Bacterial culture preliminary shows only WBC present  Fungal culture shows no growth after 1 day -Per ID, on linezolid, flagyl, and cefepime   Recommendations: - Empiric ABX for probable cerebral abscesses. Changed from Vancomycin to Linezolid. On cefepime and Flagyl. Stopped acyclovir.  - Neurosurgery has been consulted and has determined that the patient is not a good candidate for lesion biopsy - Follow up on remaining CSF studies including VDRL, gram stain, fungal stain, fungal and bacterial culture pending.  - TEE to be performed due to concerns of bacteremia -ID consulted and recommended switching from vancomycin to Linezolid to cover MRSA pneumonia, Listeria, and Nocardia as well as VRE coverage. Also recommended Liver MR w wo contrast. - On LTM - Continue Keppra at 1500 mg BID - Discussed with CCM   35 minutes spent in the neurological evaluation and management of this critically ill patient.    Assisting with this follow up evaluation:  France Ravens, MD PGY1 Resident   Electronically signed: Dr. Kerney Elbe  LOS:  5 days  11/17/2021  8:46 AM

## 2021-11-17 NOTE — Plan of Care (Signed)
°  Problem: Education: Goal: Knowledge of General Education information will improve Description: Including pain rating scale, medication(s)/side effects and non-pharmacologic comfort measures Outcome: Progressing   Problem: Health Behavior/Discharge Planning: Goal: Ability to manage health-related needs will improve Outcome: Progressing   Problem: Clinical Measurements: Goal: Ability to maintain clinical measurements within normal limits will improve Outcome: Progressing Goal: Will remain free from infection Outcome: Progressing Goal: Diagnostic test results will improve Outcome: Progressing Goal: Respiratory complications will improve Outcome: Progressing Goal: Cardiovascular complication will be avoided Outcome: Progressing   Problem: Nutrition: Goal: Adequate nutrition will be maintained Outcome: Progressing   Problem: Activity: Goal: Risk for activity intolerance will decrease Outcome: Progressing   Problem: Coping: Goal: Level of anxiety will decrease Outcome: Progressing   Problem: Elimination: Goal: Will not experience complications related to bowel motility Outcome: Progressing Goal: Will not experience complications related to urinary retention Outcome: Progressing   Problem: Safety: Goal: Ability to remain free from injury will improve Outcome: Progressing   Problem: Skin Integrity: Goal: Risk for impaired skin integrity will decrease Outcome: Progressing   Problem: Activity: Goal: Ability to tolerate increased activity will improve Outcome: Progressing   Problem: Respiratory: Goal: Ability to maintain a clear airway and adequate ventilation will improve Outcome: Progressing   Problem: Role Relationship: Goal: Method of communication will improve Outcome: Progressing

## 2021-11-17 NOTE — Progress Notes (Addendum)
[facility] Progress Note Patient Name: Kristen Murphy DOB: 1966/09/17 MRN: [id number]   Date of Service  11/17/2021  HPI/Events of Note  CVP low at 3, MAP 50- on levophed now. K/mag low, on replacement protocol  Neurology notes reviewed. EEG: left fronto temporal epileptiform foci, diffuse encephalopathy.  ID 56 y.o. female admitted with:   Multiple brain abscesses MRSA pneumonia Possible liver lesion Sepsis Acute hypoxemic respiratory failure PEA arrest with ROSC Diabetes History of cervical cancer History of VRE UTI  eICU Interventions  - LR bolus over 1 hour. Follow CVP, call back if not better      Intervention Category Intermediate Interventions: Hypotension - evaluation and management  Elmer Sow 11/17/2021, 2:49 AM

## 2021-11-18 ENCOUNTER — Inpatient Hospital Stay (HOSPITAL_COMMUNITY): Payer: Medicaid Other

## 2021-11-18 DIAGNOSIS — I469 Cardiac arrest, cause unspecified: Secondary | ICD-10-CM | POA: Diagnosis not present

## 2021-11-18 DIAGNOSIS — G060 Intracranial abscess and granuloma: Secondary | ICD-10-CM | POA: Diagnosis not present

## 2021-11-18 LAB — GLUCOSE, CAPILLARY
Glucose-Capillary: 162 mg/dL — ABNORMAL HIGH (ref 70–99)
Glucose-Capillary: 176 mg/dL — ABNORMAL HIGH (ref 70–99)
Glucose-Capillary: 198 mg/dL — ABNORMAL HIGH (ref 70–99)
Glucose-Capillary: 205 mg/dL — ABNORMAL HIGH (ref 70–99)
Glucose-Capillary: 258 mg/dL — ABNORMAL HIGH (ref 70–99)
Glucose-Capillary: 285 mg/dL — ABNORMAL HIGH (ref 70–99)

## 2021-11-18 LAB — POCT I-STAT 7, (LYTES, BLD GAS, ICA,H+H)
Acid-Base Excess: 1 mmol/L (ref 0.0–2.0)
Acid-Base Excess: 2 mmol/L (ref 0.0–2.0)
Bicarbonate: 27.6 mmol/L (ref 20.0–28.0)
Bicarbonate: 27.7 mmol/L (ref 20.0–28.0)
Calcium, Ion: 1.35 mmol/L (ref 1.15–1.40)
Calcium, Ion: 1.3 mmol/L (ref 1.15–1.40)
HCT: 32 % — ABNORMAL LOW (ref 36.0–46.0)
HCT: 32 % — ABNORMAL LOW (ref 36.0–46.0)
Hemoglobin: 10.9 g/dL — ABNORMAL LOW (ref 12.0–15.0)
Hemoglobin: 10.9 g/dL — ABNORMAL LOW (ref 12.0–15.0)
O2 Saturation: 94 %
O2 Saturation: 90 %
Patient temperature: 98.6
Patient temperature: 98
Potassium: 3.4 mmol/L — ABNORMAL LOW (ref 3.5–5.1)
Potassium: 4.2 mmol/L (ref 3.5–5.1)
Sodium: 137 mmol/L (ref 135–145)
Sodium: 136 mmol/L (ref 135–145)
TCO2: 29 mmol/L (ref 22–32)
TCO2: 29 mmol/L (ref 22–32)
pCO2 arterial: 50.8 mmHg — ABNORMAL HIGH (ref 32–48)
pCO2 arterial: 48.1 mmHg — ABNORMAL HIGH (ref 32–48)
pH, Arterial: 7.343 — ABNORMAL LOW (ref 7.35–7.45)
pH, Arterial: 7.367 (ref 7.35–7.45)
pO2, Arterial: 76 mmHg — ABNORMAL LOW (ref 83–108)
pO2, Arterial: 59 mmHg — ABNORMAL LOW (ref 83–108)

## 2021-11-18 LAB — CBC
HCT: 32.7 % — ABNORMAL LOW (ref 36.0–46.0)
Hemoglobin: 9.7 g/dL — ABNORMAL LOW (ref 12.0–15.0)
MCH: 27.7 pg (ref 26.0–34.0)
MCHC: 29.7 g/dL — ABNORMAL LOW (ref 30.0–36.0)
MCV: 93.4 fL (ref 80.0–100.0)
Platelets: 220 10*3/uL (ref 150–400)
RBC: 3.5 MIL/uL — ABNORMAL LOW (ref 3.87–5.11)
RDW: 17 % — ABNORMAL HIGH (ref 11.5–15.5)
WBC: 16.3 10*3/uL — ABNORMAL HIGH (ref 4.0–10.5)
nRBC: 1 % — ABNORMAL HIGH (ref 0.0–0.2)

## 2021-11-18 LAB — CSF CULTURE W GRAM STAIN: Culture: NO GROWTH

## 2021-11-18 LAB — BASIC METABOLIC PANEL
Anion gap: 9 (ref 5–15)
BUN: 20 mg/dL (ref 6–20)
CO2: 26 mmol/L (ref 22–32)
Calcium: 9 mg/dL (ref 8.9–10.3)
Chloride: 102 mmol/L (ref 98–111)
Creatinine, Ser: 0.94 mg/dL (ref 0.44–1.00)
GFR, Estimated: >60 mL/min (ref >60)
Glucose, Bld: 202 mg/dL — ABNORMAL HIGH (ref 70–99)
Potassium: 3.4 mmol/L — ABNORMAL LOW (ref 3.5–5.1)
Sodium: 137 mmol/L (ref 135–145)

## 2021-11-18 LAB — MAGNESIUM: Magnesium: 2.2 mg/dL (ref 1.7–2.4)

## 2021-11-18 MED ORDER — POTASSIUM CHLORIDE 20 MEQ PO PACK
40.0000 meq | PACK | Freq: Once | ORAL | Status: AC
  Administered 2021-11-18: 40 meq
  Filled 2021-11-18: qty 2

## 2021-11-18 MED ORDER — POTASSIUM CHLORIDE 20 MEQ PO PACK
40.0000 meq | PACK | Freq: Four times a day (QID) | ORAL | Status: AC
  Administered 2021-11-18 (×2): 40 meq
  Filled 2021-11-18 (×2): qty 2

## 2021-11-18 MED ORDER — FUROSEMIDE 10 MG/ML IJ SOLN
40.0000 mg | Freq: Once | INTRAMUSCULAR | Status: AC
Start: 2021-11-18 — End: 2021-11-18
  Administered 2021-11-18: 40 mg via INTRAVENOUS
  Filled 2021-11-18: qty 4

## 2021-11-18 MED ORDER — FUROSEMIDE 10 MG/ML IJ SOLN: 40.0000 mg | Freq: Once | INTRAMUSCULAR | Status: DC

## 2021-11-18 MED ORDER — GADOBUTROL 1 MMOL/ML IV SOLN
7.0000 mL | Freq: Once | INTRAVENOUS | Status: AC | PRN
  Administered 2021-11-18: 7 mL via INTRAVENOUS

## 2021-11-18 MED ORDER — PANTOPRAZOLE 2 MG/ML SUSPENSION
40.0000 mg | Freq: Every day | ORAL | Status: DC
Start: 2021-11-18 — End: 2021-12-05
  Administered 2021-11-18 – 2021-12-05 (×17): 40 mg
  Filled 2021-11-18 (×17): qty 20

## 2021-11-18 NOTE — Progress Notes (Signed)
NAME:  Kristen Murphy, MRN:  [id number], DOB:  [birth year], LOS: 6 ADMISSION DATE:  11/12/2021, CONSULTATION DATE: 11/12/2021 REFERRING MD: Dr. Langston Masker, CHIEF COMPLAINT: PEA arrest  History of Present Illness:   56 year old woman with a history of tobacco use, cervical cancer ([facility]), post nephrectomy and colostomy, CAD with LAD stent 01/2019, diabetes, depression, hypertension, remote CVA without known deficits.  Also with a history of substance abuse.  Brought in by her son to the ED 2/19 with fever, altered mental status and slurred speech. Apparently has had a flulike illness for a week, was found confused on 2/19.  Some suspicion by son that she may have been taking more prescription narcotics, also taking many NSAIDs per his report.  She was disoriented in the ED, febrile 103F, hyperglycemic. In the ED she apparently experienced acute hypoxemia, had some blood from her mouth, question emesis versus hemoptysis.  Devolved to PEA and required emergent ET intubation and CPR.  ROSC after 2 minutes.  OG tube placed with bright red blood obtained.  Chest x-ray with mild right hilar prominence, more notable on postintubation film.  No infiltrates or effusions She has received empiric antibiotics, is receiving 1 unit PRBC, is about to start Protonix infusion. Hemodynamically stabilized, mechanically ventilated.   Pertinent  Medical History   Past Medical History:  Diagnosis Date   Anemia    Anxiety    Arthritis    Asthma    Cervical cancer ([place])    Chronic kidney disease    Coronary artery disease    Depression    Diabetes mellitus without complication (HCC)    Dyspnea    Headache    Heart murmur    Hx of adenomatous polyp of colon    Hypertension    Rectovaginal fistula    Seizures ([place])    Stercoral ulcer of rectum 12/2020   Stroke [facility])     Significant Hospital Events: Including procedures, antibiotic start and stop dates in addition to other pertinent events   Head CT 2/19 > no  acute CT findings, old left basal ganglia lacunar, old left frontal cortical and subcortical infarct MR Brain 11/15/21 - Numerous lesions with mild associated hemorrhage. Mild edema.   Interim History / Subjective:   Overnight required levophed after diuresis. Weaned levophed off this morning for SBP >150. Good UOPP  Objective   Blood pressure (!) 139/54, pulse 77, temperature 98.5 F (36.9 C), temperature source Axillary, resp. rate (!) 26, height 5\' 2"  (1.575 m), weight 82 kg, SpO2 95 %. CVP:  [8 mmHg-9 mmHg] 8 mmHg  Vent Mode: PRVC FiO2 (%):  [60 %-70 %] 60 % Set Rate:  [28 bmp] 28 bmp Vt Set:  [400 mL] 400 mL PEEP:  [12 cmH20] 12 cmH20 Plateau Pressure:  [20 cmH20-23 cmH20] 23 cmH20   Intake/Output Summary (Last 24 hours) at 11/18/2021 0819 Last data filed at 11/18/2021 0700 Gross per 24 hour  Intake 2557.15 ml  Output 2925 ml  Net -367.85 ml   Filed Weights   11/16/21 0500 11/17/21 0500 11/18/21 0500  Weight: 83 kg 82.9 kg 82 kg   Physical Exam: General: Critically ill-appearing, no acute distress HENT: [place], AT, ETT in place Eyes: EOMI, no scleral icterus Respiratory: Coarse breath sounds to auscultation bilaterally.  No wheezing  Cardiovascular: RRR, -M/R/G, no JVD GI: BS+, soft, nontender Extremities:-Edema,-tenderness Neuro: Sedated, PERRL, no withdrawal, +gag/cough  GU: Foley in place   CTA 11/12/21 - RLL cavitary infiltrate CT A/P 11/12/21 - S/p colostomy,  s/p right nephrectomy CXR 11/16/21 - Small left pleural effusion with atelectasis  WBC slightly improving to 16 Hg stable ABG with improved ventilation and adequate oxygenation  K 3.4 Cr normal and stable  TTE - EF 65-70%. Grade I DD. Mild-mod mitral stenosis  Resolved Hospital Problem list     Assessment & Plan:   Multiple brain abscesses Bacterial meningitis Acute metabolic encephalopathy secondary to above Repeat CT head 2/19 neg for acute findings.  EEG and LTM neg for seizures.  Consider also  substances as son concerned about possible narcotic use.  UDS only positive for benzo. MRI brain 2/21 numerous lesions with mild associated hemorrhage and edema. Neurosurgery reviewed imaging and patient not a good candidate for lesion aspiration. -Continue antibiotics: Linezolid (2/22-), Cefepime and Flagyl -Appreciate Neuro recs. Remains on LTM -Appreciate ID recs -Reviewed cultures. F/u final LP studies, quantiferon TB, toxo, oligoclonal bands, IgG. Neg cryptococcal ag -F/u TEE and MRI liver for prior reported lesion -Droplet precautions -Contact precautions for MRSA  Acute respiratory failure with hypoxemia secondary to necrotizing MRSA pneumonia Also urine culture polymicrobial with 1k Enterobacter and 3K Pseudomona -Full vent support. Wean FIO2/PEEP. PRVC 8 cc/kg. Wean FIO2/PEEP for SpO2 goal 88-95%.  -Reviewed ABG. FIO2 60%. Wean PEEP 10 -VAP  -ABG and CXR this AM -Continue antibiotics -Lasix for goal net neg daily  Septic shock secondary  necrotizing MRSA pneumonia, bacterial meningitis - new shock 2/24 -Wean levophed for MAP goal >65 -LA normal -Appreciate ID recs  Acute cardiopulmonary arrest, PEA arrest -Defer TTM, CPR time 2 minutes, initiated immediately -Follow for any evidence of endorgan injury -Supportive care -Telemetry  Acute blood loss/chronic anemia - Hg stable, no further bleeds History of NSAID abuse. EGD blood clots in gastric fundus/body, erosive gastropathy, no stigmata of bleeding but OG tube trauma in body -No history of alcohol, octreotide deferred -Change to PO PPI daily -Trend CBC  Hypertension History of CAD -Holding amlodipine, metoprolol, lisinopril until stabilizing -Hold aspirin in setting of GI blood loss  Diabetes with hyperglycemia -CBGs, SSI if > 180 -Home metformin on hold -Home Farxiga on hold  Hyponatremia, hypokalemia Hypocalcemia - corrected to wnl in setting hypoalbuminemia - Replete K and Mg  Best Practice (right click  and "Reselect all SmartList Selections" daily)   Diet/type: tubefeeds DVT prophylaxis: prophylactic heparin  GI prophylaxis: PPI Lines: Central line Foley:  N/A Code Status:  full code Last date of multidisciplinary goals of care discussion [Updated son on 2/20]  Labs   CBC: Recent Labs  Lab 11/12/21 1508 11/12/21 1514 11/14/21 0315 11/14/21 0826 11/15/21 0449 11/16/21 0415 11/16/21 0749 11/16/21 1327 11/17/21 0104 11/17/21 0904 11/18/21 0422 11/18/21 0459  WBC 18.6*   < > 26.7*  --  22.4* 17.6*  --   --  17.5*  --   --  16.3*  NEUTROABS 16.7*  --   --   --   --   --   --   --   --   --   --   --   HGB 8.9*   < > 10.1*   < > 10.0* 9.2*   < > 10.5* 9.7* 10.2* 10.9* 9.7*  HCT 28.9*   < > 32.8*   < > 32.6* 30.8*   < > 31.0* 33.0* 30.0* 32.0* 32.7*  MCV 87.0   < > 90.4  --  91.3 92.2  --   --  92.4  --   --  93.4  PLT 251   < >  181  --  211 226  --   --  231  --   --  220   < > = values in this interval not displayed.    Basic abnormalities panel: Recent Labs  Lab 11/12/21 1931 11/12/21 1946 11/14/21 0824 11/14/21 0826 11/14/21 1700 11/14/21 1838 11/15/21 0449 11/15/21 1700 11/16/21 0415 11/16/21 0749 11/16/21 2124 11/17/21 0104 11/17/21 0904 11/18/21 0422 11/18/21 0459  NA  --    < >  --    < >  --    < > 132*  --  134*   < > 134* 135 138 137 137  K  --    < >  --    < >  --    < > 3.9  --  3.4*   < > 3.3* 3.3* 3.7 3.4* 3.4*  CL  --    < >  --    < >  --   --  99  --  102  --  101 102  --   --  102  CO2  --    < >  --    < >  --   --  22  --  23  --  24 24  --   --  26  GLUCOSE  --    < >  --    < >  --   --  131*  --  139*  --  128* 172*  --   --  202*  BUN  --    < >  --    < >  --   --  15  --  15  --  17 17  --   --  20  CREATININE  --    < >  --    < >  --   --  0.87  --  0.87  --  0.84 0.90  --   --  0.94  CALCIUM  --    < >  --    < >  --   --  8.5*  --  8.7*  --  8.9 9.1  --   --  9.0  MG 2.0  --  1.7  --  1.6*  --  2.0 1.9  --   --   --  1.9  --   --   2.2  PHOS 5.7*  --  3.7  --  2.8  --  2.7 2.4*  --   --   --   --   --   --   --    < > = values in this interval not displayed.   GFR: Estimated Creatinine Clearance: 66.4 mL/min (by C-G formula based on SCr of 0.94 mg/dL). Recent Labs  Lab 11/12/21 1931 11/12/21 2233 11/13/21 0228 11/14/21 0824 11/15/21 0449 11/16/21 0415 11/17/21 0104 11/17/21 0905 11/18/21 0459  PROCALCITON 2.04  --   --   --   --   --   --   --   --   WBC  --   --    < >  --  22.4* 17.6* 17.5*  --  16.3*  LATICACIDVEN 2.5* 1.9  --  1.2  --   --   --  1.1  --    < > = values in this interval not displayed.    Liver Function Tests: Recent Labs  Lab 11/12/21 1508 11/13/21 0228 11/16/21 0415  AST 12*  84* 19  ALT 12 51* 25  ALKPHOS 58 65 202*  BILITOT 0.3 0.2* 0.4  PROT 7.0 6.4* 5.4*  ALBUMIN 2.5* 2.3* <1.5*   No results for input(s): LIPASE, AMYLASE in the last 168 hours. No results for input(s): AMMONIA in the last 168 hours.  ABG    Component Value Date/Time   PHART 7.343 (L) 11/18/2021 0422   PCO2ART 50.8 (H) 11/18/2021 0422   PO2ART 76 (L) 11/18/2021 0422   HCO3 27.6 11/18/2021 0422   TCO2 29 11/18/2021 0422   ACIDBASEDEF 1.0 11/17/2021 0904   O2SAT 94 11/18/2021 0422     Coagulation Profile: Recent Labs  Lab 11/12/21 1508  INR 1.2    Cardiac Enzymes: No results for input(s): CKTOTAL, CKMB, CKMBINDEX, TROPONINI in the last 168 hours.  HbA1C: Hgb A1c MFr Bld  Date/Time Value Ref Range Status  11/12/2021 07:31 PM 7.2 (H) 4.8 - 5.6 % Final    Comment:    (NOTE) Pre diabetes:          5.7%-6.4%  Diabetes:              >6.4%  Glycemic control for   <7.0% adults with diabetes   02/19/2019 03:32 AM 9.2 (H) 4.8 - 5.6 % Final    Comment:    (NOTE) Pre diabetes:          5.7%-6.4% Diabetes:              >6.4% Glycemic control for   <7.0% adults with diabetes     CBG: Recent Labs  Lab 11/17/21 1545 11/17/21 1959 11/17/21 2328 11/18/21 0412 11/18/21 0732  GLUCAP  110* 107* 207* 176* 198*   Critical care time: 52 minutes   The patient is critically ill with multiple organ systems failure and requires high complexity decision making for assessment and support, frequent evaluation and titration of therapies, application of advanced monitoring technologies and extensive interpretation of multiple databases.    Rodman Pickle, M.D. [facility] Pulmonary/Critical Care Medicine 11/18/2021 8:19 AM   Please see Amion for pager number to reach on-call Pulmonary and Critical Care Team.

## 2021-11-18 NOTE — Progress Notes (Signed)
Subjective: Continues to be on sedation, with Fentanyl gtt at 100 and propofol gtt at 30.   Objective: Current vital signs: BP (!) 139/54    Pulse 77    Temp 98.6 F (37 C) (Axillary)    Resp (!) 26    Ht _0  (1.575 m)    Wt 82 kg    SpO2 95%    BMI 33.06 kg/m  Vital signs in last 24 hours: Temp:  [98 F (36.7 C)-98.7 F (37.1 C)] 98.6 F (37 C) (02/25 0400) Pulse Rate:  [63-94] 77 (02/25 0700) Resp:  [14-31] 26 (02/25 0700) BP: (106-143)/(43-54) 139/54 (02/25 0700) SpO2:  [90 %-97 %] 95 % (02/25 0722) Arterial Line BP: (107-161)/(40-76) 153/54 (02/25 0700) FiO2 (%):  [60 %-80 %] 60 % (02/25 0722) Weight:  [82 kg] 82 kg (02/25 0500)  Intake/Output from previous day: 02/24 0701 - 02/25 0700 In: 2753.8 [I.V.:808.5; NG/GT:356; IV Piggyback:1589.3] Out: 3335 [Urine:3275; Emesis/NG output:60] Intake/Output this shift: No intake/output data recorded. Nutritional status:  Diet Order             Diet NPO time specified  Diet effective midnight                     Neurologic Exam: Ment: Patient under heavy sedation (propofol 30, fentanyl 100). Eyes are closed with no opening to any stimuli. Obtunded/sedated and non responsive to noxious stimuli. Patient does not follow commands. No attempts to communicate.  CN: Pupils are pinpoint. No blink to threat. No blink to eyelid stimulation. Negative dolls eyes. Breathes over the ventilator when sedation is decreased.  Motor/Sensory: All 4 extremities are are flaccid with no movement to noxious stimuli (on heavy sedation)  Lab Results: Results for orders placed or performed during the hospital encounter of 11/12/21 (from the past 48 hour(s))  I-STAT 7, (LYTES, BLD GAS, ICA, H+H)     Status: Abnormal   Collection Time: 11/16/21  7:49 AM  Result Value Ref Range   pH, Arterial 7.269 (L) 7.35 - 7.45   pCO2 arterial 54.0 (H) 32 - 48 mmHg   pO2, Arterial 62 (L) 83 - 108 mmHg   Bicarbonate 24.8 20.0 - 28.0 mmol/L   TCO2 26 22 - 32  mmol/L   O2 Saturation 88 %   Acid-base deficit 3.0 (H) 0.0 - 2.0 mmol/L   Sodium 136 135 - 145 mmol/L   Potassium 3.7 3.5 - 5.1 mmol/L   Calcium, Ion 1.35 1.15 - 1.40 mmol/L   HCT 38.0 36.0 - 46.0 %   Hemoglobin 12.9 12.0 - 15.0 g/dL   Patient temperature 98.0 F    Sample type ARTERIAL   Glucose, capillary     Status: Abnormal   Collection Time: 11/16/21  8:07 AM  Result Value Ref Range   Glucose-Capillary 101 (H) 70 - 99 mg/dL    Comment: Glucose reference range applies only to samples taken after fasting for at least 8 hours.  Troponin I (High Sensitivity)     Status: Abnormal   Collection Time: 11/16/21 11:01 AM  Result Value Ref Range   Troponin I (High Sensitivity) 18 (H) <18 ng/L    Comment: (NOTE) Elevated high sensitivity troponin I (hsTnI) values and significant  changes across serial measurements may suggest ACS but many other  chronic and acute conditions are known to elevate hsTnI results.  Refer to the "Links" section for chest pain algorithms and additional  guidance. Performed at [facility], [place]  [address] [place], [place] [address]   Glucose, capillary     Status: Abnormal   Collection Time: 11/16/21 12:23 PM  Result Value Ref Range   Glucose-Capillary 159 (H) 70 - 99 mg/dL    Comment: Glucose reference range applies only to samples taken after fasting for at least 8 hours.  I-STAT 7, (LYTES, BLD GAS, ICA, H+H)     Status: Abnormal   Collection Time: 11/16/21  1:27 PM  Result Value Ref Range   pH, Arterial 7.289 (L) 7.35 - 7.45   pCO2 arterial 55.3 (H) 32 - 48 mmHg   pO2, Arterial 73 (L) 83 - 108 mmHg   Bicarbonate 26.6 20.0 - 28.0 mmol/L   TCO2 28 22 - 32 mmol/L   O2 Saturation 92 %   Acid-base deficit 1.0 0.0 - 2.0 mmol/L   Sodium 134 (L) 135 - 145 mmol/L   Potassium 3.5 3.5 - 5.1 mmol/L   Calcium, Ion 1.35 1.15 - 1.40 mmol/L   HCT 31.0 (L) 36.0 - 46.0 %   Hemoglobin 10.5 (L) 12.0 - 15.0 g/dL   Patient temperature 98.0 F    Sample type  ARTERIAL   Glucose, capillary     Status: None   Collection Time: 11/16/21  5:32 PM  Result Value Ref Range   Glucose-Capillary 97 70 - 99 mg/dL    Comment: Glucose reference range applies only to samples taken after fasting for at least 8 hours.  Glucose, capillary     Status: Abnormal   Collection Time: 11/16/21  8:46 PM  Result Value Ref Range   Glucose-Capillary 109 (H) 70 - 99 mg/dL    Comment: Glucose reference range applies only to samples taken after fasting for at least 8 hours.  Basic metabolic panel     Status: Abnormal   Collection Time: 11/16/21  9:24 PM  Result Value Ref Range   Sodium 134 (L) 135 - 145 mmol/L   Potassium 3.3 (L) 3.5 - 5.1 mmol/L   Chloride 101 98 - 111 mmol/L   CO2 24 22 - 32 mmol/L   Glucose, Bld 128 (H) 70 - 99 mg/dL    Comment: Glucose reference range applies only to samples taken after fasting for at least 8 hours.   BUN 17 6 - 20 mg/dL   Creatinine, Ser 0.84 0.44 - 1.00 mg/dL   Calcium 8.9 8.9 - 10.3 mg/dL   GFR, Estimated >60 >60 mL/min    Comment: (NOTE) Calculated using the CKD-EPI Creatinine Equation (2021)    Anion gap 9 5 - 15    Comment: Performed at [facility] [address], [place], [place] [address]  Glucose, capillary     Status: Abnormal   Collection Time: 11/17/21 12:00 AM  Result Value Ref Range   Glucose-Capillary 158 (H) 70 - 99 mg/dL    Comment: Glucose reference range applies only to samples taken after fasting for at least 8 hours.  CBC     Status: Abnormal   Collection Time: 11/17/21  1:04 AM  Result Value Ref Range   WBC 17.5 (H) 4.0 - 10.5 K/uL   RBC 3.57 (L) 3.87 - 5.11 MIL/uL   Hemoglobin 9.7 (L) 12.0 - 15.0 g/dL   HCT 33.0 (L) 36.0 - 46.0 %   MCV 92.4 80.0 - 100.0 fL   MCH 27.2 26.0 - 34.0 pg   MCHC 29.4 (L) 30.0 - 36.0 g/dL   RDW 16.6 (H) 11.5 - 15.5 %   Platelets 231 150 -  400 K/uL   nRBC 0.9 (H) 0.0 - 0.2 %    Comment: Performed at [facility], [place] [address], [place], [place] [address]   Basic metabolic panel     Status: Abnormal   Collection Time: 11/17/21  1:04 AM  Result Value Ref Range   Sodium 135 135 - 145 mmol/L   Potassium 3.3 (L) 3.5 - 5.1 mmol/L   Chloride 102 98 - 111 mmol/L   CO2 24 22 - 32 mmol/L   Glucose, Bld 172 (H) 70 - 99 mg/dL    Comment: Glucose reference range applies only to samples taken after fasting for at least 8 hours.   BUN 17 6 - 20 mg/dL   Creatinine, Ser 0.90 0.44 - 1.00 mg/dL   Calcium 9.1 8.9 - 10.3 mg/dL   GFR, Estimated >60 >60 mL/min    Comment: (NOTE) Calculated using the CKD-EPI Creatinine Equation (2021)    Anion gap 9 5 - 15    Comment: Performed at [facility] [address], [place], [place] [address]  Magnesium     Status: None   Collection Time: 11/17/21  1:04 AM  Result Value Ref Range   Magnesium 1.9 1.7 - 2.4 mg/dL    Comment: Performed at [facility] [address], [place], [place] [address]  Glucose, capillary     Status: Abnormal   Collection Time: 11/17/21  4:03 AM  Result Value Ref Range   Glucose-Capillary 132 (H) 70 - 99 mg/dL    Comment: Glucose reference range applies only to samples taken after fasting for at least 8 hours.  Glucose, capillary     Status: Abnormal   Collection Time: 11/17/21  7:30 AM  Result Value Ref Range   Glucose-Capillary 101 (H) 70 - 99 mg/dL    Comment: Glucose reference range applies only to samples taken after fasting for at least 8 hours.  I-STAT 7, (LYTES, BLD GAS, ICA, H+H)     Status: Abnormal   Collection Time: 11/17/21  9:04 AM  Result Value Ref Range   pH, Arterial 7.263 (L) 7.35 - 7.45   pCO2 arterial 57.9 (H) 32 - 48 mmHg   pO2, Arterial 81 (L) 83 - 108 mmHg   Bicarbonate 26.2 20.0 - 28.0 mmol/L   TCO2 28 22 - 32 mmol/L   O2 Saturation 94 %   Acid-base deficit 1.0 0.0 - 2.0 mmol/L   Sodium 138 135 - 145 mmol/L   Potassium 3.7 3.5 - 5.1 mmol/L   Calcium, Ion 1.41 (H) 1.15 - 1.40 mmol/L   HCT 30.0 (L) 36.0 - 46.0 %   Hemoglobin 10.2 (L) 12.0 -  15.0 g/dL   Patient temperature 98.0 F    Sample type ARTERIAL   Lactic acid, plasma     Status: None   Collection Time: 11/17/21  9:05 AM  Result Value Ref Range   Lactic Acid, Venous 1.1 0.5 - 1.9 mmol/L    Comment: Performed at [facility], [place] [address], [place], [place] [address]  Triglycerides     Status: Abnormal   Collection Time: 11/17/21  9:45 AM  Result Value Ref Range   Triglycerides 482 (H) <150 mg/dL    Comment: Performed at [facility] [address], [place], [place] [address]  Glucose, capillary     Status: Abnormal   Collection Time: 11/17/21 11:42 AM  Result Value Ref Range   Glucose-Capillary 159 (H) 70 - 99 mg/dL  Comment: Glucose reference range applies only to samples taken after fasting for at least 8 hours.  Glucose, capillary     Status: Abnormal   Collection Time: 11/17/21  3:45 PM  Result Value Ref Range   Glucose-Capillary 110 (H) 70 - 99 mg/dL    Comment: Glucose reference range applies only to samples taken after fasting for at least 8 hours.  Glucose, capillary     Status: Abnormal   Collection Time: 11/17/21  7:59 PM  Result Value Ref Range   Glucose-Capillary 107 (H) 70 - 99 mg/dL    Comment: Glucose reference range applies only to samples taken after fasting for at least 8 hours.  Glucose, capillary     Status: Abnormal   Collection Time: 11/17/21 11:28 PM  Result Value Ref Range   Glucose-Capillary 207 (H) 70 - 99 mg/dL    Comment: Glucose reference range applies only to samples taken after fasting for at least 8 hours.  Glucose, capillary     Status: Abnormal   Collection Time: 11/18/21  4:12 AM  Result Value Ref Range   Glucose-Capillary 176 (H) 70 - 99 mg/dL    Comment: Glucose reference range applies only to samples taken after fasting for at least 8 hours.  I-STAT 7, (LYTES, BLD GAS, ICA, H+H)     Status: Abnormal   Collection Time: 11/18/21  4:22 AM  Result Value Ref Range   pH, Arterial 7.343 (L) 7.35 - 7.45   pCO2  arterial 50.8 (H) 32 - 48 mmHg   pO2, Arterial 76 (L) 83 - 108 mmHg   Bicarbonate 27.6 20.0 - 28.0 mmol/L   TCO2 29 22 - 32 mmol/L   O2 Saturation 94 %   Acid-Base Excess 1.0 0.0 - 2.0 mmol/L   Sodium 137 135 - 145 mmol/L   Potassium 3.4 (L) 3.5 - 5.1 mmol/L   Calcium, Ion 1.35 1.15 - 1.40 mmol/L   HCT 32.0 (L) 36.0 - 46.0 %   Hemoglobin 10.9 (L) 12.0 - 15.0 g/dL   Patient temperature 98.6 F    Sample type ARTERIAL   CBC     Status: Abnormal   Collection Time: 11/18/21  4:59 AM  Result Value Ref Range   WBC 16.3 (H) 4.0 - 10.5 K/uL   RBC 3.50 (L) 3.87 - 5.11 MIL/uL   Hemoglobin 9.7 (L) 12.0 - 15.0 g/dL   HCT 32.7 (L) 36.0 - 46.0 %   MCV 93.4 80.0 - 100.0 fL   MCH 27.7 26.0 - 34.0 pg   MCHC 29.7 (L) 30.0 - 36.0 g/dL   RDW 17.0 (H) 11.5 - 15.5 %   Platelets 220 150 - 400 K/uL   nRBC 1.0 (H) 0.0 - 0.2 %    Comment: Performed at [facility], [address], [place], [place] [address]  Basic metabolic panel     Status: Abnormal   Collection Time: 11/18/21  4:59 AM  Result Value Ref Range   Sodium 137 135 - 145 mmol/L   Potassium 3.4 (L) 3.5 - 5.1 mmol/L   Chloride 102 98 - 111 mmol/L   CO2 26 22 - 32 mmol/L   Glucose, Bld 202 (H) 70 - 99 mg/dL    Comment: Glucose reference range applies only to samples taken after fasting for at least 8 hours.   BUN 20 6 - 20 mg/dL   Creatinine, Ser 0.94 0.44 - 1.00 mg/dL   Calcium 9.0 8.9 - 10.3 mg/dL   GFR, Estimated >60 >  60 mL/min    Comment: (NOTE) Calculated using the CKD-EPI Creatinine Equation (2021)    Anion gap 9 5 - 15    Comment: Performed at [facility], [place] [address], [place], [place] [address]  Magnesium     Status: None   Collection Time: 11/18/21  4:59 AM  Result Value Ref Range   Magnesium 2.2 1.7 - 2.4 mg/dL    Comment: Performed at [facility] [address], [place], [place] [address]  Glucose, capillary     Status: Abnormal   Collection Time: 11/18/21  7:32 AM  Result Value Ref Range    Glucose-Capillary 198 (H) 70 - 99 mg/dL    Comment: Glucose reference range applies only to samples taken after fasting for at least 8 hours.    Recent Results (from the past 240 hour(s))  Urine Culture     Status: Abnormal   Collection Time: 11/12/21  3:36 PM   Specimen: In/Out Cath Urine  Result Value Ref Range Status   Specimen Description IN/OUT CATH URINE  Final   Special Requests   Final    NONE Performed at [facility], [address] [place], [place] [address]    Culture (A)  Final    1,000 COLONIES/mL ENTEROBACTER CLOACAE 3,000 COLONIES/mL PSEUDOMONAS AERUGINOSA    Report Status 11/15/2021 FINAL  Final   Organism ID, Bacteria ENTEROBACTER CLOACAE (A)  Final   Organism ID, Bacteria PSEUDOMONAS AERUGINOSA (A)  Final      Susceptibility   Enterobacter cloacae - MIC*    CEFAZOLIN >=64 RESISTANT Resistant     CEFEPIME <=0.12 SENSITIVE Sensitive     CIPROFLOXACIN <=0.25 SENSITIVE Sensitive     GENTAMICIN <=1 SENSITIVE Sensitive     IMIPENEM <=0.25 SENSITIVE Sensitive     NITROFURANTOIN 32 SENSITIVE Sensitive     TRIMETH/SULFA <=20 SENSITIVE Sensitive     PIP/TAZO <=4 SENSITIVE Sensitive     * 1,000 COLONIES/mL ENTEROBACTER CLOACAE   Pseudomonas aeruginosa - MIC*    CEFTAZIDIME 4 SENSITIVE Sensitive     CIPROFLOXACIN <=0.25 SENSITIVE Sensitive     GENTAMICIN <=1 SENSITIVE Sensitive     IMIPENEM 2 SENSITIVE Sensitive     PIP/TAZO 8 SENSITIVE Sensitive     CEFEPIME 2 SENSITIVE Sensitive     * 3,000 COLONIES/mL PSEUDOMONAS AERUGINOSA  Respiratory (~20 pathogens) panel by PCR     Status: None   Collection Time: 11/12/21  3:37 PM   Specimen: Nasopharyngeal Swab; Respiratory  Result Value Ref Range Status   Adenovirus NOT DETECTED NOT DETECTED Final   Coronavirus 229E NOT DETECTED NOT DETECTED Final    Comment: (NOTE) The Coronavirus on the Respiratory Panel, DOES NOT test for the novel  Coronavirus (2019 nCoV)    Coronavirus HKU1 NOT DETECTED NOT DETECTED Final    Coronavirus NL63 NOT DETECTED NOT DETECTED Final   Coronavirus OC43 NOT DETECTED NOT DETECTED Final   Metapneumovirus NOT DETECTED NOT DETECTED Final   Rhinovirus / Enterovirus NOT DETECTED NOT DETECTED Final   Influenza A NOT DETECTED NOT DETECTED Final   Influenza B NOT DETECTED NOT DETECTED Final   Parainfluenza Virus 1 NOT DETECTED NOT DETECTED Final   Parainfluenza Virus 2 NOT DETECTED NOT DETECTED Final   Parainfluenza Virus 3 NOT DETECTED NOT DETECTED Final   Parainfluenza Virus 4 NOT DETECTED NOT DETECTED Final   Respiratory Syncytial Virus NOT DETECTED NOT DETECTED Final   Bordetella pertussis NOT DETECTED NOT DETECTED Final   Bordetella  Parapertussis NOT DETECTED NOT DETECTED Final   Chlamydophila pneumoniae NOT DETECTED NOT DETECTED Final   Mycoplasma pneumoniae NOT DETECTED NOT DETECTED Final    Comment: Performed at [facility], [place] [address] [place], [place] [address]  Blood Culture (routine x 2)     Status: None   Collection Time: 11/12/21  4:23 PM   Specimen: Site Not Specified; Blood  Result Value Ref Range Status   Specimen Description SITE NOT SPECIFIED  Final   Special Requests   Final    BOTTLES DRAWN AEROBIC AND ANAEROBIC Blood Culture adequate volume   Culture   Final    NO GROWTH 5 DAYS Performed at [facility], [place] [address] [place], [place] [address]    Report Status 11/17/2021 FINAL  Final  Resp Panel by RT-PCR (Flu A&B, Covid) Nasopharyngeal Swab     Status: None   Collection Time: 11/12/21  4:25 PM   Specimen: Nasopharyngeal Swab; Nasopharyngeal(NP) swabs in vial transport medium  Result Value Ref Range Status   SARS Coronavirus 2 by RT PCR NEGATIVE NEGATIVE Final    Comment: (NOTE) SARS-CoV-2 target nucleic acids are NOT DETECTED.  The SARS-CoV-2 RNA is generally detectable in upper respiratory specimens during the acute phase of infection. The lowest concentration of SARS-CoV-2 viral copies this assay can detect is 138  copies/mL. A negative result does not preclude SARS-Cov-2 infection and should not be used as the sole basis for treatment or other patient management decisions. A negative result may occur with  improper specimen collection/handling, submission of specimen other than nasopharyngeal swab, presence of viral mutation(s) within the areas targeted by this assay, and inadequate number of viral copies(<138 copies/mL). A negative result must be combined with clinical observations, patient history, and epidemiological information. The expected result is Negative.  Fact Sheet for Patients:  EntrepreneurPulse.com.au  Fact Sheet for Healthcare Providers:  IncredibleEmployment.be  This test is no t yet approved or cleared by the [place] FDA and  has been authorized for detection and/or diagnosis of SARS-CoV-2 by FDA under an Emergency Use Authorization (EUA). This EUA will remain  in effect (meaning this test can be used) for the duration of the COVID-19 declaration under Section 564(b)(1) of the Act, 21 U.S.C.section 360bbb-3(b)(1), unless the authorization is terminated  or revoked sooner.       Influenza A by PCR NEGATIVE NEGATIVE Final   Influenza B by PCR NEGATIVE NEGATIVE Final    Comment: (NOTE) The Xpert Xpress SARS-CoV-2/FLU/RSV plus assay is intended as an aid in the diagnosis of influenza from Nasopharyngeal swab specimens and should not be used as a sole basis for treatment. Nasal washings and aspirates are unacceptable for Xpert Xpress SARS-CoV-2/FLU/RSV testing.  Fact Sheet for Patients: EntrepreneurPulse.com.au  Fact Sheet for Healthcare Providers: IncredibleEmployment.be  This test is not yet approved or cleared by the [place] FDA and has been authorized for detection and/or diagnosis of SARS-CoV-2 by FDA under an Emergency Use Authorization (EUA). This EUA will remain in effect (meaning  this test can be used) for the duration of the COVID-19 declaration under Section 564(b)(1) of the Act, 21 U.S.C. section 360bbb-3(b)(1), unless the authorization is terminated or revoked.  Performed at [facility], [place] [address], [place],  [address]   Blood Culture (routine x 2)     Status: None   Collection Time: 11/12/21  4:44 PM   Specimen: Site Not Specified; Blood  Result Value Ref Range Status   Specimen  Description SITE NOT SPECIFIED  Final   Special Requests   Final    BOTTLES DRAWN AEROBIC AND ANAEROBIC Blood Culture results may not be optimal due to an excessive volume of blood received in culture bottles   Culture   Final    NO GROWTH 5 DAYS Performed at [facility], [place] [address], [place], [place] [address]    Report Status 11/17/2021 FINAL  Final  MRSA Next Gen by PCR, Nasal     Status: Abnormal   Collection Time: 11/12/21  7:36 PM   Specimen: Nasal Mucosa; Nasal Swab  Result Value Ref Range Status   MRSA by PCR Next Gen DETECTED (A) NOT DETECTED Final    Comment: RESULT CALLED TO, READ BACK BY AND VERIFIED WITH: V [email or web address]  11/12/21 [place] (NOTE) The GeneXpert MRSA Assay (FDA approved for NASAL specimens only), is one component of a comprehensive MRSA colonization surveillance program. It is not intended to diagnose MRSA infection nor to guide or monitor treatment for MRSA infections. Test performance is not FDA approved in patients less than 19 years old. Performed at [facility], [place] [address] [place], [place] [address]   Culture, Respiratory w Gram Stain     Status: None   Collection Time: 11/13/21  2:09 AM   Specimen: Bronchoalveolar Lavage; Respiratory  Result Value Ref Range Status   Specimen Description BRONCHIAL ALVEOLAR LAVAGE  Final   Special Requests NONE  Final   Gram Stain   Final    MODERATE WBC PRESENT, PREDOMINANTLY PMN FEW GRAM POSITIVE COCCI IN PAIRS AND CHAINS Performed at [facility], [address] [place], [place] [address]    Culture FEW METHICILLIN RESISTANT STAPHYLOCOCCUS AUREUS  Final   Report Status 11/15/2021 FINAL  Final   Organism ID, Bacteria METHICILLIN RESISTANT STAPHYLOCOCCUS AUREUS  Final      Susceptibility   Methicillin resistant staphylococcus aureus - MIC*    CIPROFLOXACIN >=8 RESISTANT Resistant     ERYTHROMYCIN >=8 RESISTANT Resistant     GENTAMICIN <=0.5 SENSITIVE Sensitive     OXACILLIN >=4 RESISTANT Resistant     TETRACYCLINE <=1 SENSITIVE Sensitive     VANCOMYCIN 1 SENSITIVE Sensitive     TRIMETH/SULFA <=10 SENSITIVE Sensitive     CLINDAMYCIN <=0.25 SENSITIVE Sensitive     RIFAMPIN <=0.5 SENSITIVE Sensitive     Inducible Clindamycin NEGATIVE Sensitive     * FEW METHICILLIN RESISTANT STAPHYLOCOCCUS AUREUS  CSF culture w Stat Gram Stain     Status: None (Preliminary result)   Collection Time: 11/15/21  8:02 AM   Specimen: CSF; Cerebrospinal Fluid  Result Value Ref Range Status   Specimen Description CSF  Final   Special Requests NONE  Final   Gram Stain   Final    WBC PRESENT,BOTH PMN AND MONONUCLEAR NO ORGANISMS SEEN CYTOSPIN SMEAR    Culture   Final    NO GROWTH 2 DAYS Performed at [facility], [address] [place], [place] [address]    Report Status PENDING  Incomplete  Acid Fast Smear (AFB)     Status: None   Collection Time: 11/15/21  9:23 AM   Specimen: CSF; Cerebrospinal Fluid  Result Value Ref Range Status   AFB Specimen Processing Direct Inoculation  Final   Acid Fast Smear Negative  Final    Comment: (NOTE) Performed At: [facility] [place], [place] [id number] Rush Farmer MD [id number]    Source (AFB) CSF  Final  Comment: Performed at [facility], [place] [address], [place], [place] [address]  Culture, fungus without smear     Status: None (Preliminary result)   Collection Time: 11/15/21  9:24 AM   Specimen: CSF; Other  Result Value Ref Range Status   Specimen Description CSF   Final   Special Requests Normal  Final   Culture   Final    NO FUNGUS ISOLATED AFTER 2 DAYS Performed at [facility], [address] [place], [place] [address]    Report Status PENDING  Incomplete    Lipid Panel Recent Labs    11/17/21 0945  TRIG 482*    Studies/Results: EEG adult  Result Date: 11/16/2021 Lora Havens, MD     11/17/2021  9:44 AM Patient Name: Kristen Murphy MRN: [id number] Epilepsy Attending: Lora Havens Referring Physician/Provider: Kerney Elbe, MD Date: 11/16/2021 Duration: 21.48 mins  Patient history: 56 year old woman with a history of cervical cancer, status post nephrectomy 2 months ago, colostomy, CAD, diabetes, depression, hypertension, remote history of stroke without residual deficits who was brought in by EMS for altered mental status.  EEG to evaluate for seizure.  Level of alertness: obtunded  AEDs during EEG study: propofol, LEV  Technical aspects: This EEG study was done with scalp electrodes positioned according to the 10-20 International system of electrode placement. Electrical activity was acquired at a sampling rate of _0  and reviewed with a high frequency filter of _1  and a low frequency filter of _2 . EEG data were recorded continuously and digitally stored.  Description: EEG showed continuous generalized and lateralized right hemisphere low amplitude 3 to 6 Hz theta-delta slowing.  Frequent spikes were noted in left frontotemporal region, at times quasiperiodic every 3 to 6 seconds.  Sharp transients were also noted in right frontotemporal region.  Hyperventilation and photic stimulation were not performed.    ABNORMALITY -Spike, left frontotemporal region -Continuous slow, generalized and lateralized right hemisphere  IMPRESSION: This study showed evidence of epileptogenicity arising from left frontotemporal region.  Additionally study is suggestive of severe diffuse encephalopathy, nonspecific etiology.  No seizures were seen  throughout the recording. Priyanka [name]   Overnight EEG with video  Result Date: 11/17/2021 Lora Havens, MD     11/18/2021  7:19 AM Patient Name: Kristen Murphy MRN: [id number] Epilepsy Attending: Lora Havens Referring Physician/Provider: Kerney Elbe, MD Duration: 11/16/2021 1127 to 11/17/2021 1127  Patient history: 56 year old woman with a history of cervical cancer, status post nephrectomy 2 months ago, colostomy, CAD, diabetes, depression, hypertension, remote history of stroke without residual deficits who was brought in by EMS for altered mental status.  EEG to evaluate for seizure.  Level of alertness: obtunded  AEDs during EEG study: propofol, LEV  Technical aspects: This EEG study was done with scalp electrodes positioned according to the 10-20 International system of electrode placement. Electrical activity was acquired at a sampling rate of _3  and reviewed with a high frequency filter of _4  and a low frequency filter of _5 . EEG data were recorded continuously and digitally stored.  Description: EEG showed continuous generalized and lateralized right hemisphere low amplitude 3 to 6 Hz theta-delta slowing.  Generalized periodic discharges with triphasic morphology were noted every 3 to 4 seconds.  Hyperventilation and photic stimulation were not performed.   Patient event button was pressed on 11/17/2021 at 0953 for unclear reasons.  Concomitant EEG before, during and after the event did not show any EEG to suggest seizure.  ABNORMALITY -Periodic discharges with triphasic morphology, generalized -Continuous slow, generalized and lateralized right hemisphere  IMPRESSION: This study showed generalized periodic discharges with triphasic morphology every 3 to 4 seconds.  This EEG pattern is on the ictal-interictal continuum with low potential for seizures.  It can also be seen due to toxic-metabolic causes like cefepime toxicity, hyperammonemia, postcardiac arrest.  Additionally study is  suggestive of cortical dysfunction in right hemisphere likely secondary to underlying structural abnormality as well as moderate to severe diffuse encephalopathy, nonspecific etiology.  No seizures were seen throughout the recording. Patient event button was pressed on 11/17/2021 at 0953 for unclear reasons without concomitant EEG change.  This was most likely not an epileptic event.  Priyanka [name]    Medications: Scheduled:  chlorhexidine gluconate (MEDLINE KIT)  15 mL Mouth Rinse BID   Chlorhexidine Gluconate Cloth  6 each Topical Daily   docusate  100 mg Per Tube BID   fentaNYL (SUBLIMAZE) injection  50 mcg Intravenous Once   heparin injection (subcutaneous)  5,000 Units Subcutaneous Q8H   insulin aspart  0-9 Units Subcutaneous Q4H   insulin aspart  3 Units Subcutaneous Q4H   mouth rinse  15 mL Mouth Rinse 10 times per day   pantoprazole  40 mg Intravenous Q12H   polyethylene glycol  17 g Per Tube Daily   sodium chloride flush  3 mL Intravenous Once   Continuous:  sodium chloride     sodium chloride 5 mL/hr at 11/15/21 0803   sodium chloride     ceFEPime (MAXIPIME) IV Stopped (11/18/21 0328)   feeding supplement (VITAL AF 1.2 CAL) 1,000 mL (11/17/21 2047)   fentaNYL infusion INTRAVENOUS 100 mcg/hr (11/18/21 0700)   levETIRAcetam Stopped (11/17/21 2134)   linezolid (ZYVOX) IV Stopped (11/17/21 2223)   metronidazole Stopped (11/18/21 0242)   norepinephrine (LEVOPHED) Adult infusion Stopped (11/17/21 1530)   propofol (DIPRIVAN) infusion 30 mcg/kg/min (11/18/21 0700)    Assessment: 56 year old woman with a history of cervical cancer, status post nephrectomy 2 months ago, colostomy, chronic occlusion of the left common carotid artery, CAD, diabetes, depression, hypertension, remote history of stroke without residual deficits who was brought in by EMS for altered mental status. She had had a flulike illness for a week PTA and was found confused on 2/19. The patient had acute PEA arrest  while in the ED on Sunday (2/19) with CPR time of 2 minutes. Diagnosed with acute respiratory failure with hypoxemia and was intubated at that time. MRI brain performed on Tuesday revealed numerous (> 50) widely distributed lesions in the cerebral hemispheres as well as the cerebellum.  - LTM EEG,  11/17/2021 1127 to 11/18/2021 0730, to assess for seizure activity after CCM noticed some twitching in arm: Findings of continuous slowing, generalized and lateralized to the right hemisphere. This study is suggestive of cortical dysfunction in right hemisphere likely secondary to underlying structural abnormality as well as moderate to severe diffuse encephalopathy, nonspecific to etiology.  No seizures or definite epileptiform discharges were seen throughout the recording - Neurological exam while on sedation today (2/25) is unchanged since yesterday.    - MRI brain with and without contrast: Numerous (> 50) small, round DWI+ lesions are scattered throughout the brain parenchyma, infratentorially and supratentorially, some with ring-like morphologies. On FLAIR images, several of the lesions exhibit adjacent vasogenic edema. Some of the lesions exhibit corresponding T1-hypointensity. A small subset of the lesions exhibit enhancement on the post-contrast images. Many lesions show evidence of mild associated hemorrhage. Restricted  diffusion in the left lateral ventricle likely due to ventriculitis. 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 DDx for underlying pathogen includes bacterial (including tuberculosis), fungal (histoplasmosis, aspergillosis), protozoan (toxoplasmosis), parasitic (neurocysticercosis). Lower on the DDx would be an atypical presentation of CNS inflammatory disease such as MS or CNS lupus, or metastatic disease.    -WBC 22.4 > 17.6 > 16.3. -CSF from LP showed: cell count markedly abnormal with WBC 585 of which 84% were segmented  neutrophils. Glucose 21 (low), Total protein 191 (high). Consistent with bacterial meningitis. -Cryptococcal antigen negative, Acid Fast Smear negative, remainder of infectious labs In process Bacterial culture with no growth x 2 days. CSF fungal culture with no fungus isolated after 2 days.     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 On linezolid, cefepime and Flagyl. Per ID, linezolid to cover MRSA pneumonia, Listeria, and Nocardia as well as VRE coverage. - Follow up on remaining CSF studies including VDRL, gram stain, fungal stain, fungal and bacterial culture pending.  - TEE has been ordered and is pending. To be performed due to concerns of possible SBE - ID has recommended Liver MR w wo contrast. - Discontinuing LTM - Continue Keppra at 1500 mg BID  40 minutes spent in the neurological evaluation and management of this critically ill patient    LOS: 6 days   _0  signed: Dr. Kerney Elbe 11/18/2021  7:34 AM

## 2021-11-18 NOTE — Progress Notes (Signed)
[facility] Progress Note Patient Name: Kristen Murphy DOB: 06-23-1966 MRN: [id number]   Date of Service  11/18/2021  HPI/Events of Note  Notified of ABG result on PEEP 12, 70%, 400, rate 28 --> 7.343/50.8/76.  eICU Interventions  Continue on current vent settings.     Intervention Category Intermediate Interventions: Diagnostic test evaluation  Elsie Lincoln 11/18/2021, 5:02 AM

## 2021-11-18 NOTE — Progress Notes (Signed)
°   11/18/21 0722  Airway 7.5 mm  Placement Date/Time: 11/12/21 1748   Placed By: ED Physician  ETT Types: Oral  Size (mm): 7.5 mm  Cuffed: Cuffed  Secured at (cm) 25 cm  Measured From Lips  Secured Location Left  Secured By Actuary Repositioned Yes  Prone position No  Cuff Pressure (cm H2O) Green OR 18-26 CmH2O  Site Condition Dry  Adult Ventilator Settings  Vent Type Servo i  Humidity HME  Vent Mode PRVC  Vt Set 400 mL  Set Rate 28 bmp  FiO2 (%) 60 %  I Time 0.7 Sec(s)  PEEP 12 cmH20  Adult Ventilator Measurements  Mean Airway Pressure 16 cmH20  Plateau Pressure 23 cmH20  Resp Rate Spontaneous 0 br/min  Resp Rate Total 28 br/min  Exhaled Vt 439 mL  Measured Ve 13.6 mL  I:E Ratio Measured 1:2  Auto PEEP 0 cmH20  Total PEEP 12 cmH20  SpO2 95 %  Adult Ventilator Alarms  Alarms On Y  Ve High Alarm 21 L/min  Ve Low Alarm 6 L/min  Resp Rate High Alarm 42 br/min  Resp Rate Low Alarm 18  PEEP Low Alarm 8 cmH2O  Press High Alarm 45 cmH2O  Daily Weaning Assessment  Daily Assessment of Readiness to Wean Wean protocol criteria not met  Reason not met Fi02 > 40%;PEEP > 8  Breath Sounds  Bilateral Breath Sounds Diminished  Airway Suctioning/Secretions  Suction Type ETT  Suction Device  Catheter  Secretion Amount Moderate  Secretion Color White;Pink tinged  Secretion Consistency Thick  Suction Tolerance Tolerated well  Suctioning Adverse Effects None

## 2021-11-18 NOTE — Progress Notes (Signed)
Pharmacy Antibiotic Note  Kristen Murphy is a 56 y.o. female admitted on 11/12/2021 with septic shock, necrotizing MRSA PNA, bacterial meningitis, multiple brain abcesses.  Pharmacy has been consulted for linezolid, flagyl, cefepime.  Scr stable 0.94.    Plan: Continue metronidazole 500 q 8 hrs Continue linezolid 600 mg IV q 12 hrs Continue cefepime 2g IV q 8 hrs.  Height: _0  (157.5 cm) Weight: 82 kg (180 lb 12.4 oz) IBW/kg (Calculated) : 50.1  Temp (24hrs), Avg:98.5 F (36.9 C), Min:98 F (36.7 C), Max:98.7 F (37.1 C)  Recent Labs  Lab 11/12/21 1518 11/12/21 1931 11/12/21 2233 11/13/21 0228 11/14/21 0315 11/14/21 0824 11/14/21 1400 11/15/21 0449 11/15/21 0732 11/16/21 0415 11/16/21 2124 11/17/21 0104 11/17/21 0905 11/18/21 0459  WBC  --   --   --    < > 26.7*  --   --  22.4*  --  17.6*  --  17.5*  --  16.3*  CREATININE  --   --   --    < > 1.06*  --    < > 0.87  --  0.87 0.84 0.90  --  0.94  LATICACIDVEN 1.5 2.5* 1.9  --   --  1.2  --   --   --   --   --   --  1.1  --   VANCOTROUGH  --   --   --   --   --   --   --   --  23*  --   --   --   --   --    < > = values in this interval not displayed.    Estimated Creatinine Clearance: 66.4 mL/min (by C-G formula based on SCr of 0.94 mg/dL).    Allergies  Allergen Reactions   Glimepiride Anaphylaxis    Has sulfa in it per pharmacy   Sulfa Antibiotics Anaphylaxis and Hives   Effexor [Venlafaxine] Hives    Antimicrobials this admission: Rocephin 2/19 >> 2/21 Acyclovir 2/19 >> 2/21 Vanc 2/19 >> 2/22 Cefepime 2/21 >> Flagyl 2/21 >> Linezolid 2/22 >   Dose adjustments this admission:  Microbiology results: 2/22 CSF - ngtd 2/19 BCx: neg 2/19 Ucx pseudomonas pan sens, enterobacter cefaz -R, S -to all others 2/20 BAL > few MRSA 2/19 MRSA PCR > detected  Thank you for allowing pharmacy to be a part of this patients care.  [place] Crane, Roylene Reason, BCCP Clinical Pharmacist  11/18/2021 9:59 AM   [facility]  pharmacy phone numbers are listed on [email or web address]

## 2021-11-18 NOTE — Progress Notes (Signed)
K+ 3.4 Replaced per protocol  

## 2021-11-18 NOTE — Progress Notes (Signed)
LTM EEG discontinued per [place]- no skin breakdown at [employer].

## 2021-11-18 NOTE — Procedures (Addendum)
Patient Name: Kristen Murphy  MRN: [id number]  Epilepsy Attending: Lora Havens  Referring Physician/Provider: Kerney Elbe, MD Duration: 11/17/2021 1127 to 11/18/2021 0158   Patient history: 56 year old woman with a history of cervical cancer, status post nephrectomy 2 months ago, colostomy, CAD, diabetes, depression, hypertension, remote history of stroke without residual deficits who was brought in by EMS for altered mental status.  EEG to evaluate for seizure.   Level of alertness: obtunded   AEDs during EEG study: propofol, LEV   Technical aspects: This EEG study was done with scalp electrodes positioned according to the 10-20 International system of electrode placement. Electrical activity was acquired at a sampling rate of 500Hz  and reviewed with a high frequency filter of 70Hz  and a low frequency filter of 1Hz . EEG data were recorded continuously and digitally stored.    Description: EEG showed continuous generalized and lateralized right hemisphere low amplitude 3 to 6 Hz theta-delta slowing which at times appears sharply contoured. Hyperventilation and photic stimulation were not performed.     Patient event button was pressed on 11/18/2021 at 0738 for cough and twitching of left foot.  Concomitant EEG before, during and after the event did not show any definitive EEG to suggest seizure.  ABNORMALITY -Continuous slow, generalized and lateralized right hemisphere   IMPRESSION: This study is suggestive of cortical dysfunction in right hemisphere likely secondary to underlying structural abnormality as well as moderate to severe diffuse encephalopathy, nonspecific etiology.  No seizures or definite epileptiform discharges were seen throughout the recording.  Event button was pressed on 11/19/2019 0730 for cough and twitching of left foot without definite concomitant EEG change.  However, focal motor seizure may not be seen on scalp EEG.  Therefore clinical correlation is  recommended.  Greenley Martone [name]

## 2021-11-19 ENCOUNTER — Inpatient Hospital Stay (HOSPITAL_COMMUNITY): Payer: Medicaid Other

## 2021-11-19 DIAGNOSIS — G060 Intracranial abscess and granuloma: Secondary | ICD-10-CM | POA: Diagnosis not present

## 2021-11-19 LAB — GLUCOSE, CAPILLARY
Glucose-Capillary: 215 mg/dL — ABNORMAL HIGH (ref 70–99)
Glucose-Capillary: 319 mg/dL — ABNORMAL HIGH (ref 70–99)
Glucose-Capillary: 364 mg/dL — ABNORMAL HIGH (ref 70–99)
Glucose-Capillary: 356 mg/dL — ABNORMAL HIGH (ref 70–99)
Glucose-Capillary: 320 mg/dL — ABNORMAL HIGH (ref 70–99)
Glucose-Capillary: 336 mg/dL — ABNORMAL HIGH (ref 70–99)

## 2021-11-19 LAB — POCT I-STAT 7, (LYTES, BLD GAS, ICA,H+H)
Acid-Base Excess: 4 mmol/L — ABNORMAL HIGH (ref 0.0–2.0)
Bicarbonate: 30.3 mmol/L — ABNORMAL HIGH (ref 20.0–28.0)
Calcium, Ion: 1.29 mmol/L (ref 1.15–1.40)
HCT: 30 % — ABNORMAL LOW (ref 36.0–46.0)
Hemoglobin: 10.2 g/dL — ABNORMAL LOW (ref 12.0–15.0)
O2 Saturation: 91 %
Patient temperature: 99.1
Potassium: 4.1 mmol/L (ref 3.5–5.1)
Sodium: 137 mmol/L (ref 135–145)
TCO2: 32 mmol/L (ref 22–32)
pCO2 arterial: 52 mmHg — ABNORMAL HIGH (ref 32–48)
pH, Arterial: 7.375 (ref 7.35–7.45)
pO2, Arterial: 65 mmHg — ABNORMAL LOW (ref 83–108)

## 2021-11-19 LAB — BASIC METABOLIC PANEL
Anion gap: 8 (ref 5–15)
Anion gap: 8 (ref 5–15)
BUN: 26 mg/dL — ABNORMAL HIGH (ref 6–20)
BUN: 34 mg/dL — ABNORMAL HIGH (ref 6–20)
CO2: 27 mmol/L (ref 22–32)
CO2: 30 mmol/L (ref 22–32)
Calcium: 8.9 mg/dL (ref 8.9–10.3)
Calcium: 8.9 mg/dL (ref 8.9–10.3)
Chloride: 103 mmol/L (ref 98–111)
Chloride: 100 mmol/L (ref 98–111)
Creatinine, Ser: 0.9 mg/dL (ref 0.44–1.00)
Creatinine, Ser: 0.91 mg/dL (ref 0.44–1.00)
GFR, Estimated: >60 mL/min (ref >60)
GFR, Estimated: >60 mL/min (ref >60)
Glucose, Bld: 270 mg/dL — ABNORMAL HIGH (ref 70–99)
Glucose, Bld: 323 mg/dL — ABNORMAL HIGH (ref 70–99)
Potassium: 3.9 mmol/L (ref 3.5–5.1)
Potassium: 3.8 mmol/L (ref 3.5–5.1)
Sodium: 138 mmol/L (ref 135–145)
Sodium: 138 mmol/L (ref 135–145)

## 2021-11-19 LAB — CBC
HCT: 31.9 % — ABNORMAL LOW (ref 36.0–46.0)
Hemoglobin: 9.7 g/dL — ABNORMAL LOW (ref 12.0–15.0)
MCH: 28 pg (ref 26.0–34.0)
MCHC: 30.4 g/dL (ref 30.0–36.0)
MCV: 91.9 fL (ref 80.0–100.0)
Platelets: 226 10*3/uL (ref 150–400)
RBC: 3.47 MIL/uL — ABNORMAL LOW (ref 3.87–5.11)
RDW: 17.2 % — ABNORMAL HIGH (ref 11.5–15.5)
WBC: 14.2 10*3/uL — ABNORMAL HIGH (ref 4.0–10.5)
nRBC: 1.2 % — ABNORMAL HIGH (ref 0.0–0.2)

## 2021-11-19 LAB — TRIGLYCERIDES: Triglycerides: 540 mg/dL — ABNORMAL HIGH (ref <150)

## 2021-11-19 MED ORDER — FUROSEMIDE 10 MG/ML IJ SOLN
40.0000 mg | Freq: Once | INTRAMUSCULAR | Status: AC
Start: 2021-11-19 — End: 2021-11-19
  Administered 2021-11-19: 40 mg via INTRAVENOUS
  Filled 2021-11-19: qty 4

## 2021-11-19 MED ORDER — INSULIN ASPART 100 UNIT/ML IJ SOLN
6.0000 [IU] | INTRAMUSCULAR | Status: DC
Start: 2021-11-19 — End: 2021-11-20
  Administered 2021-11-19 – 2021-11-20 (×6): 6 [IU] via SUBCUTANEOUS

## 2021-11-19 MED ORDER — MIDAZOLAM HCL 2 MG/2ML IJ SOLN
2.0000 mg | INTRAMUSCULAR | Status: DC | PRN
  Administered 2021-11-19 – 2021-11-23 (×6): 2 mg via INTRAVENOUS
  Filled 2021-11-19 (×6): qty 2

## 2021-11-19 MED ORDER — INSULIN ASPART 100 UNIT/ML IJ SOLN
0.0000 [IU] | INTRAMUSCULAR | Status: DC
  Administered 2021-11-19 (×2): 11 [IU] via SUBCUTANEOUS
  Administered 2021-11-20: 8 [IU] via SUBCUTANEOUS
  Administered 2021-11-20: 3 [IU] via SUBCUTANEOUS
  Administered 2021-11-20: 5 [IU] via SUBCUTANEOUS
  Administered 2021-11-20: 8 [IU] via SUBCUTANEOUS
  Administered 2021-11-20: 5 [IU] via SUBCUTANEOUS
  Administered 2021-11-20: 8 [IU] via SUBCUTANEOUS
  Administered 2021-11-21: 5 [IU] via SUBCUTANEOUS
  Administered 2021-11-21: 8 [IU] via SUBCUTANEOUS
  Administered 2021-11-21: 5 [IU] via SUBCUTANEOUS
  Administered 2021-11-21: 8 [IU] via SUBCUTANEOUS
  Administered 2021-11-21 (×2): 5 [IU] via SUBCUTANEOUS
  Administered 2021-11-22: 3 [IU] via SUBCUTANEOUS
  Administered 2021-11-22 (×4): 5 [IU] via SUBCUTANEOUS
  Administered 2021-11-23 (×2): 8 [IU] via SUBCUTANEOUS
  Administered 2021-11-23: 5 [IU] via SUBCUTANEOUS
  Administered 2021-11-23 (×2): 3 [IU] via SUBCUTANEOUS
  Administered 2021-11-23: 5 [IU] via SUBCUTANEOUS
  Administered 2021-11-24: 3 [IU] via SUBCUTANEOUS
  Administered 2021-11-24 (×2): 2 [IU] via SUBCUTANEOUS
  Administered 2021-11-24: 3 [IU] via SUBCUTANEOUS
  Administered 2021-11-24: 5 [IU] via SUBCUTANEOUS
  Administered 2021-11-24 – 2021-11-25 (×2): 8 [IU] via SUBCUTANEOUS
  Administered 2021-11-25 (×2): 2 [IU] via SUBCUTANEOUS
  Administered 2021-11-25 – 2021-11-26 (×3): 3 [IU] via SUBCUTANEOUS
  Administered 2021-11-26: 5 [IU] via SUBCUTANEOUS
  Administered 2021-11-26 – 2021-11-27 (×2): 3 [IU] via SUBCUTANEOUS
  Administered 2021-11-27 (×4): 2 [IU] via SUBCUTANEOUS
  Administered 2021-11-28: 5 [IU] via SUBCUTANEOUS

## 2021-11-19 MED ORDER — DEXMEDETOMIDINE HCL IN NACL 400 MCG/100ML IV SOLN
0.4000 ug/kg/h | INTRAVENOUS | Status: DC
  Administered 2021-11-19 – 2021-11-22 (×3): 0.4 ug/kg/h via INTRAVENOUS
  Administered 2021-11-22: 0.5 ug/kg/h via INTRAVENOUS
  Administered 2021-11-22: 1 ug/kg/h via INTRAVENOUS
  Administered 2021-11-23: 0.9 ug/kg/h via INTRAVENOUS
  Administered 2021-11-23 (×2): 1 ug/kg/h via INTRAVENOUS
  Administered 2021-11-23: 1.2 ug/kg/h via INTRAVENOUS
  Administered 2021-11-24: 0.7 ug/kg/h via INTRAVENOUS
  Administered 2021-11-24: 0.9 ug/kg/h via INTRAVENOUS
  Administered 2021-11-24: 0.6 ug/kg/h via INTRAVENOUS
  Administered 2021-11-24: 0.8 ug/kg/h via INTRAVENOUS
  Administered 2021-11-25 (×2): 0.7 ug/kg/h via INTRAVENOUS
  Filled 2021-11-19 (×15): qty 100

## 2021-11-19 MED ORDER — INSULIN ASPART 100 UNIT/ML IJ SOLN: 6.0000 [IU] | INTRAMUSCULAR | Status: DC

## 2021-11-19 NOTE — Progress Notes (Addendum)
PCCM Progress Note  Called to bedside by RN for transient ectopy, PVCs. EKG NSR with non-specific TWI. Also patient off sedation and with decreased responsiveness with upward deviation of pupils bilaterally, minimally reactive.   CT head and CT chest ordered.  Family updated at bedside. Her recovery with her current conditions are expected to be slow however would obtain head imaging given acute changes in neuro exam.  Rodman Pickle, M.D. [facility] Pulmonary/Critical Care Medicine 11/19/2021 1:26 PM    ADDENDUM:  Updated family regarding CT Chest which demonstrates bilateral lower lobe pneumonia with atelectasis and CT head with new infarction in superior medial right frontal lobe and several hypoattenuating lesions in left basal ganglia and left cerebral hemispheres. Discussed with Neuro. Imaging suggests central source with embolization to brain. Patient has TEE ordered.  Rodman Pickle, M.D. [facility] Pulmonary/Critical Care Medicine 11/19/2021 4:12 PM   Total additional critical care time: 55 min

## 2021-11-19 NOTE — Progress Notes (Signed)
[place] Progress Note Patient Name: Kristen Murphy DOB: [birth year] MRN: [id number]   Date of Service  11/19/2021  HPI/Events of Note  Seizure - 2 minute seizure which broke spontaneously. Already on Keppra. Neurology seeing the patient in consultation already.   eICU Interventions  Plan: Change Versed 2 mg IV Q 2 hours to include agitation, sedation or seizures.  Bedside nurse to notify neurology for further management.      Intervention Category Major Interventions: Seizures - evaluation and management  Madelyn Tlatelpa Cornelia Copa 11/19/2021, 10:25 PM

## 2021-11-19 NOTE — Progress Notes (Signed)
°   11/19/21 0729  Airway 7.5 mm  Placement Date/Time: 11/12/21 1748   Placed By: ED Physician  ETT Types: Oral  Size (mm): 7.5 mm  Cuffed: Cuffed  Secured at (cm) 26 cm  Measured From Lips  Secured Location Right  Secured By Actuary Repositioned Yes  Prone position No  Cuff Pressure (cm H2O) Green OR 18-26 CmH2O  Site Condition Dry  Adult Ventilator Settings  Vent Type Servo i  Humidity HME  Vent Mode PRVC  Vt Set 400 mL  Set Rate 28 bmp  FiO2 (%) 60 %  I Time 0.7 Sec(s)  PEEP 10 cmH20  Adult Ventilator Measurements  Peak Airway Pressure 27 L/min  Mean Airway Pressure 19 cmH20  Plateau Pressure 13 cmH20  Resp Rate Spontaneous 4 br/min  Resp Rate Total 32 br/min  Exhaled Vt 400 mL  Measured Ve 11.9 mL  I:E Ratio Measured 1:  Auto PEEP 5 cmH20  Total PEEP 15 cmH20  SpO2 94 %  Adult Ventilator Alarms  Alarms On Y  Ve High Alarm 21 L/min  Ve Low Alarm 6 L/min  Resp Rate High Alarm 42 br/min  Resp Rate Low Alarm 18  PEEP Low Alarm 8 cmH2O  Press High Alarm 45 cmH2O  Daily Weaning Assessment  Daily Assessment of Readiness to Wean Wean protocol criteria not met  Reason not met Fi02 > 40%;PEEP > 8  Breath Sounds  Bilateral Breath Sounds Diminished  Airway Suctioning/Secretions  Suction Type ETT  Suction Device  Catheter  Secretion Amount Small  Secretion Color White  Secretion Consistency Thick  Suction Tolerance Tolerated well  Suctioning Adverse Effects None

## 2021-11-19 NOTE — Progress Notes (Addendum)
On-call interim progress note  Subjective Called by the CCM team because of patient having generalized seizures lasting from 2 minutes to a few seconds starting 10:15 PM. PCCM attending was contacted, Versed ordered which broke the seizure. Patient has been seizure-free since then. Currently on fentanyl and Precedex for sedation.  Propofol was discontinued this morning and switch to Precedex. She is on Keppra for antiepileptics.  Objective No spontaneous movements, laying in bed Does not open eyes to voice or noxious stimulation Pupils are barely reactive with disconjugate gaze right exotropia. Difficult to ascertain facial symmetry No movement to noxious stimulation in any of the 4 extremities.  Assessment Patient is being treated for multiple brain abscesses/septic emboli.  Was started on Keppra for possible seizures-twitching in the arms noted on 224, for which you had been on long-term EEG monitoring which had shown cortical dysfunction in the right hemisphere without any seizures or definite epileptiform activity being captured.  LTM EEG was discontinued on 11/18/2021. CT head this afternoon with relatively well-defined foci of hypoattenuation in the superior medial right frontal and mid chest and right parietal lobe consistent with recent infarction-new since the MRI of 11/14/2021-this was noted by the PCCM attending and discussed with the on-call day team-likely from a central embolic source.  Several small hypoattenuating lesions in the basal ganglia, cerebral hemorrhage and cerebellar hemispheres corresponding to the lesion seen prior brain MRI with differentials of abscess and metastatic disease.  Breakthrough seizure again today.  Unclear if this was related to any medication changes-propofol was discontinued and Precedex started this afternoon.   Recommendations: - Continue Keppra for now - Versed or Ativan as needed for any seizure lasting more than a few minutes-also call  neurology at that time - I would recommend considering changing cefepime to another antibiotic as it can cause neurotoxicity. - I would recommend spot EEG followed by continuous EEG monitoring in the morning - If she has another clinical seizure, would consider adding second antiepileptic agent.  At that time would also consider rapid EEG-which is of limited utility but is sometimes useful to rule out status epilepticus.  Best utilization of an electrodiagnostic study would be with proper EEG in the morning.  Plan discussed with Dr. Oletta Darter  -- Amie Portland, MD Neurologist [facility] Pager: [phone]  Additional 30 min of CC time.

## 2021-11-19 NOTE — Progress Notes (Signed)
NAME:  Kristen Murphy, MRN:  [id number], DOB:  17-Jul-1966, LOS: 7 ADMISSION DATE:  11/12/2021, CONSULTATION DATE: 11/12/2021 REFERRING MD: Dr. Langston Masker, CHIEF COMPLAINT: PEA arrest  History of Present Illness:   56 year old woman with a history of tobacco use, cervical cancer ([facility]), post nephrectomy and colostomy, CAD with LAD stent 01/2019, diabetes, depression, hypertension, remote CVA without known deficits.  Also with a history of substance abuse.  Brought in by her son to the ED 2/19 with fever, altered mental status and slurred speech. Apparently has had a flulike illness for a week, was found confused on 2/19.  Some suspicion by son that she may have been taking more prescription narcotics, also taking many NSAIDs per his report.  She was disoriented in the ED, febrile 103F, hyperglycemic. In the ED she apparently experienced acute hypoxemia, had some blood from her mouth, question emesis versus hemoptysis.  Devolved to PEA and required emergent ET intubation and CPR.  ROSC after 2 minutes.  OG tube placed with bright red blood obtained.  Chest x-ray with mild right hilar prominence, more notable on postintubation film.  No infiltrates or effusions She has received empiric antibiotics, is receiving 1 unit PRBC, is about to start Protonix infusion. Hemodynamically stabilized, mechanically ventilated.   Pertinent  Medical History   Past Medical History:  Diagnosis Date   Anemia    Anxiety    Arthritis    Asthma    Cervical cancer ([place])    Chronic kidney disease    Coronary artery disease    Depression    Diabetes mellitus without complication (HCC)    Dyspnea    Headache    Heart murmur    Hx of adenomatous polyp of colon    Hypertension    Rectovaginal fistula    Seizures ([place])    Stercoral ulcer of rectum 12/2020   Stroke [facility]     Significant Hospital Events: Including procedures, antibiotic start and stop dates in addition to other pertinent events   Head CT 2/19 > no  acute CT findings, old left basal ganglia lacunar, old left frontal cortical and subcortical infarct MR Brain 11/15/21 - Numerous lesions with mild associated hemorrhage. Mild edema. Concerned for brain abscesses 2/22 LP consistent with bacterial meningitis. Respiratory Cx +MRSA     Interim History / Subjective:   Overnight again required levophed overnight after bath and again weaned off in the morning without issue. MR Live with no evidence of malignancy or abscess  Objective   Blood pressure 123/70, pulse (!) 103, temperature 99.6 F (37.6 C), temperature source Axillary, resp. rate 19, height 5\' 2"  (1.575 m), weight 80.7 kg, SpO2 94 %. CVP:  [8 mmHg] 8 mmHg  Vent Mode: PRVC FiO2 (%):  [60 %-70 %] 60 % Set Rate:  [28 bmp] 28 bmp Vt Set:  [400 mL] 400 mL PEEP:  [10 cmH20] 10 cmH20 Plateau Pressure:  [13 cmH20-22 cmH20] 13 cmH20   Intake/Output Summary (Last 24 hours) at 11/19/2021 0748 Last data filed at 11/19/2021 0600 Gross per 24 hour  Intake 3458.62 ml  Output 2635 ml  Net 823.62 ml   Filed Weights   11/17/21 0500 11/18/21 0500 11/19/21 0500  Weight: 82.9 kg 82 kg 80.7 kg    Physical Exam: General: Critically ill-appearing, no acute distress HENT: [place], AT, ETT in place Eyes: EOMI, no scleral icterus Respiratory: Diminished and clear to auscultation bilaterally.  No crackles, wheezing or rales Cardiovascular: RRR, -M/R/G, no JVD GI: BS+, soft, nontender  Extremities:-Edema,-tenderness Neuro: Sedated, 31mm pupil equal sluggishly reactive, +cough/gag GU: Foley in place  CTA 11/12/21 - RLL cavitary infiltrate CT A/P 11/12/21 - S/p colostomy, s/p right nephrectomy CXR 11/16/21 - Small left pleural effusion with atelectasis  WBC improving to 14 Hg stable ABG with adequate oxygenation  K 3.9 Cr normal and stable  TTE - EF 65-70%. Grade I DD. Mild-mod mitral stenosis  CSF Cell count 462 with neutrophilic predominance, low glucose CSF culture neg.  MRI Liver 2/25 neg  for infection or malignancy   Resolved Hospital Problem list   Hyponatremia  Assessment & Plan:   Multiple brain abscesses Bacterial meningitis Acute metabolic encephalopathy secondary to above Repeat CT head 2/19 neg for acute findings.  EEG and LTM 2/25 neg for seizures.  Consider also substances as son concerned about possible narcotic use.  UDS only positive for benzo. MRI brain 2/21 numerous lesions with mild associated hemorrhage and edema. Neurosurgery reviewed imaging and patient not a good candidate for lesion aspiration. -Continue antibiotics: Linezolid (2/22-), Cefepime and Flagyl -Appreciate Neuro recs. Remains on LTM -Appreciate ID recs -Reviewed cultures. F/u final LP studies, quantiferon TB, toxo, oligoclonal bands, IgG. Neg cryptococcal ag -F/u TEE  -Droplet precautions -Contact precautions for MRSA  Acute respiratory failure with hypoxemia secondary to necrotizing MRSA pneumonia Also urine culture polymicrobial with 1k Enterobacter and 3K Pseudomona -Full vent support. Wean FIO2/PEEP. PRVC 8 cc/kg. Wean FIO2/PEEP for SpO2 goal 88-95%.  -Reviewed ABG Wean FIO2 50% -VAP  -PRN ABG and CXR  -Continue antibiotics -Repeat Lasix for goal net neg daily -PAD protocol: Transition from propofol to Precedex. Wean Fentanyl  Septic shock secondary  necrotizing MRSA pneumonia, bacterial meningitis - intermittent levophed use at night however able to wean off during the day. Leukocytosis improving and clinically pneumonia seems to be improving -Weaned levophed off this am -LA normal -Appreciate ID recs  Acute cardiopulmonary arrest, PEA arrest -Defer TTM, CPR time 2 minutes, initiated immediately -Follow for any evidence of endorgan injury -Supportive care -Telemetry  Acute blood loss/chronic anemia - Hg stable, no further bleeds History of NSAID abuse. EGD blood clots in gastric fundus/body, erosive gastropathy, no stigmata of bleeding but OG tube trauma in body -No  history of alcohol, octreotide deferred -Change to PO PPI daily -Trend CBC  Hypertension History of CAD -Holding amlodipine, metoprolol, lisinopril until stabilizing -Hold aspirin in setting of GI blood loss  Diabetes with hyperglycemia -CBGs, SSI if > 180 -Home metformin on hold -Home Farxiga on hold  Hypokalemia Hypocalcemia - corrected to wnl in setting hypoalbuminemia - Replete K and Mg  Best Practice (right click and "Reselect all SmartList Selections" daily)   Diet/type: tubefeeds DVT prophylaxis: prophylactic heparin  GI prophylaxis: PPI Lines: Central line Foley:  N/A Code Status:  full code Last date of multidisciplinary goals of care discussion [Updated son on 2/20]  Labs   CBC: Recent Labs  Lab 11/12/21 1508 11/12/21 1514 11/15/21 0449 11/16/21 0415 11/16/21 0749 11/17/21 0104 11/17/21 0904 11/18/21 0422 11/18/21 0459 11/18/21 1220 11/19/21 0328 11/19/21 0329  WBC 18.6*   < > 22.4* 17.6*  --  17.5*  --   --  16.3*  --  14.2*  --   NEUTROABS 16.7*  --   --   --   --   --   --   --   --   --   --   --   HGB 8.9*   < > 10.0* 9.2*   < > 9.7*   < >  10.9* 9.7* 10.9* 9.7* 10.2*  HCT 28.9*   < > 32.6* 30.8*   < > 33.0*   < > 32.0* 32.7* 32.0* 31.9* 30.0*  MCV 87.0   < > 91.3 92.2  --  92.4  --   --  93.4  --  91.9  --   PLT 251   < > 211 226  --  231  --   --  220  --  226  --    < > = values in this interval not displayed.    Basic abnormalities panel: Recent Labs  Lab 11/12/21 1931 11/12/21 1946 11/14/21 0824 11/14/21 0826 11/14/21 1700 11/14/21 1838 11/15/21 0449 11/15/21 1700 11/16/21 0415 11/16/21 0749 11/16/21 2124 11/17/21 0104 11/17/21 0904 11/18/21 0422 11/18/21 0459 11/18/21 1220 11/19/21 0328 11/19/21 0329  NA  --    < >  --    < >  --    < > 132*  --  134*   < > 134* 135   < > 137 137 136 138 137  K  --    < >  --    < >  --    < > 3.9  --  3.4*   < > 3.3* 3.3*   < > 3.4* 3.4* 4.2 3.9 4.1  CL  --    < >  --    < >  --   --  99   --  102  --  101 102  --   --  102  --  103  --   CO2  --    < >  --    < >  --   --  22  --  23  --  24 24  --   --  26  --  27  --   GLUCOSE  --    < >  --    < >  --   --  131*  --  139*  --  128* 172*  --   --  202*  --  270*  --   BUN  --    < >  --    < >  --   --  15  --  15  --  17 17  --   --  20  --  26*  --   CREATININE  --    < >  --    < >  --   --  0.87  --  0.87  --  0.84 0.90  --   --  0.94  --  0.90  --   CALCIUM  --    < >  --    < >  --   --  8.5*  --  8.7*  --  8.9 9.1  --   --  9.0  --  8.9  --   MG 2.0  --  1.7  --  1.6*  --  2.0 1.9  --   --   --  1.9  --   --  2.2  --   --   --   PHOS 5.7*  --  3.7  --  2.8  --  2.7 2.4*  --   --   --   --   --   --   --   --   --   --    < > = values in this interval not  displayed.   GFR: Estimated Creatinine Clearance: 68.6 mL/min (by C-G formula based on SCr of 0.9 mg/dL). Recent Labs  Lab 11/12/21 1931 11/12/21 2233 11/13/21 0228 11/14/21 1308 11/15/21 0449 11/16/21 0415 11/17/21 0104 11/17/21 0905 11/18/21 0459 11/19/21 0328  PROCALCITON 2.04  --   --   --   --   --   --   --   --   --   WBC  --   --    < >  --    < > 17.6* 17.5*  --  16.3* 14.2*  LATICACIDVEN 2.5* 1.9  --  1.2  --   --   --  1.1  --   --    < > = values in this interval not displayed.    Liver Function Tests: Recent Labs  Lab 11/12/21 1508 11/13/21 0228 11/16/21 0415  AST 12* 84* 19  ALT 12 51* 25  ALKPHOS 58 65 202*  BILITOT 0.3 0.2* 0.4  PROT 7.0 6.4* 5.4*  ALBUMIN 2.5* 2.3* <1.5*   No results for input(s): LIPASE, AMYLASE in the last 168 hours. No results for input(s): AMMONIA in the last 168 hours.  ABG    Component Value Date/Time   PHART 7.375 11/19/2021 0329   PCO2ART 52.0 (H) 11/19/2021 0329   PO2ART 65 (L) 11/19/2021 0329   HCO3 30.3 (H) 11/19/2021 0329   TCO2 32 11/19/2021 0329   ACIDBASEDEF 1.0 11/17/2021 0904   O2SAT 91 11/19/2021 0329     Coagulation Profile: Recent Labs  Lab 11/12/21 1508  INR 1.2    Cardiac  Enzymes: No results for input(s): CKTOTAL, CKMB, CKMBINDEX, TROPONINI in the last 168 hours.  HbA1C: Hgb A1c MFr Bld  Date/Time Value Ref Range Status  11/12/2021 07:31 PM 7.2 (H) 4.8 - 5.6 % Final    Comment:    (NOTE) Pre diabetes:          5.7%-6.4%  Diabetes:              >6.4%  Glycemic control for   <7.0% adults with diabetes   02/19/2019 03:32 AM 9.2 (H) 4.8 - 5.6 % Final    Comment:    (NOTE) Pre diabetes:          5.7%-6.4% Diabetes:              >6.4% Glycemic control for   <7.0% adults with diabetes     CBG: Recent Labs  Lab 11/18/21 1620 11/18/21 1938 11/18/21 2339 11/19/21 0333 11/19/21 0737  GLUCAP 205* 162* 285* 215* 319*   Critical care time: 45 minutes   The patient is critically ill with multiple organ systems failure and requires high complexity decision making for assessment and support, frequent evaluation and titration of therapies, application of advanced monitoring technologies and extensive interpretation of multiple databases.  Independent Critical Care Time: 80 Minutes.   Rodman Pickle, M.D. [facility] Pulmonary/Critical Care Medicine 11/19/2021 7:48 AM   Please see Amion for pager number to reach on-call Pulmonary and Critical Care Team.

## 2021-11-19 NOTE — Progress Notes (Addendum)
Subjective: On fentanyl gtt at a rate of 25 and propofol at a rate of 10, both decreased significantly since yesterday.  Objective: Current vital signs: BP 114/62    Pulse (!) 101    Temp 99.6 F (37.6 C) (Axillary)    Resp 19    Ht _0  (1.575 m)    Wt 80.7 kg    SpO2 95%    BMI 32.54 kg/m  Vital signs in last 24 hours: Temp:  [99 F (37.2 C)-99.6 F (37.6 C)] 99.6 F (37.6 C) (02/26 0734) Pulse Rate:  [69-119] 101 (02/26 0745) Resp:  [13-31] 19 (02/26 0745) BP: (103-174)/(52-83) 114/62 (02/26 0745) SpO2:  [86 %-97 %] 95 % (02/26 0745) Arterial Line BP: (67-229)/(46-110) 111/63 (02/26 0745) FiO2 (%):  [60 %-70 %] 60 % (02/26 0729) Weight:  [80.7 kg] 80.7 kg (02/26 0500)  Intake/Output from previous day: 02/25 0701 - 02/26 0700 In: 3458.6 [I.V.:730.9; NG/GT:1299.4; IV Piggyback:1428.3] Out: 2635 [Urine:2575; Emesis/NG output:60] Intake/Output this shift: No intake/output data recorded. Nutritional status:  Diet Order             Diet NPO time specified  Diet effective midnight                  HEENT: [place]/AT. Neck supple. Lungs: Intubated  Neurologic Exam: Performed while on fentanyl gtt at a rate of 25 and propofol at a rate of 10. Ment: Eyes are closed initially, but will remain partially open after lids are passively elevated. Obtunded/sedated and non-responsive to noxious stimuli. Does not gaze towards or away from visual stimuli. Does not respond to voice or other auditory stimuli. Patient does not follow commands. No attempts to communicate.  CN: Pupils are 2 mm and sluggishly reactive bilaterally. Conjugate slow roving EOM to right, left and upwards. No blink to threat. Blinks to eyelid stimulation bilaterally. Face flaccidly symmetric.   Motor/Sensory: All 4 extremities are are flaccid with no movement to noxious stimuli (sedated) Other: No jerking, twitching or myoclonus seen.    Lab Results: Results for orders placed or performed during the hospital encounter  of 11/12/21 (from the past 48 hour(s))  Triglycerides     Status: Abnormal   Collection Time: 11/17/21  9:45 AM  Result Value Ref Range   Triglycerides 482 (H) <150 mg/dL    Comment: Performed at [facility] [address], [place], [place] [address]  Glucose, capillary     Status: Abnormal   Collection Time: 11/17/21 11:42 AM  Result Value Ref Range   Glucose-Capillary 159 (H) 70 - 99 mg/dL    Comment: Glucose reference range applies only to samples taken after fasting for at least 8 hours.  Glucose, capillary     Status: Abnormal   Collection Time: 11/17/21  3:45 PM  Result Value Ref Range   Glucose-Capillary 110 (H) 70 - 99 mg/dL    Comment: Glucose reference range applies only to samples taken after fasting for at least 8 hours.  Glucose, capillary     Status: Abnormal   Collection Time: 11/17/21  7:59 PM  Result Value Ref Range   Glucose-Capillary 107 (H) 70 - 99 mg/dL    Comment: Glucose reference range applies only to samples taken after fasting for at least 8 hours.  Glucose, capillary     Status: Abnormal   Collection Time: 11/17/21 11:28 PM  Result Value Ref Range   Glucose-Capillary 207 (H) 70 - 99 mg/dL    Comment: Glucose reference range applies  only to samples taken after fasting for at least 8 hours.  Glucose, capillary     Status: Abnormal   Collection Time: 11/18/21  4:12 AM  Result Value Ref Range   Glucose-Capillary 176 (H) 70 - 99 mg/dL    Comment: Glucose reference range applies only to samples taken after fasting for at least 8 hours.  I-STAT 7, (LYTES, BLD GAS, ICA, H+H)     Status: Abnormal   Collection Time: 11/18/21  4:22 AM  Result Value Ref Range   pH, Arterial 7.343 (L) 7.35 - 7.45   pCO2 arterial 50.8 (H) 32 - 48 mmHg   pO2, Arterial 76 (L) 83 - 108 mmHg   Bicarbonate 27.6 20.0 - 28.0 mmol/L   TCO2 29 22 - 32 mmol/L   O2 Saturation 94 %   Acid-Base Excess 1.0 0.0 - 2.0 mmol/L   Sodium 137 135 - 145 mmol/L   Potassium 3.4 (L) 3.5 - 5.1  mmol/L   Calcium, Ion 1.35 1.15 - 1.40 mmol/L   HCT 32.0 (L) 36.0 - 46.0 %   Hemoglobin 10.9 (L) 12.0 - 15.0 g/dL   Patient temperature 98.6 F    Sample type ARTERIAL   CBC     Status: Abnormal   Collection Time: 11/18/21  4:59 AM  Result Value Ref Range   WBC 16.3 (H) 4.0 - 10.5 K/uL   RBC 3.50 (L) 3.87 - 5.11 MIL/uL   Hemoglobin 9.7 (L) 12.0 - 15.0 g/dL   HCT 32.7 (L) 36.0 - 46.0 %   MCV 93.4 80.0 - 100.0 fL   MCH 27.7 26.0 - 34.0 pg   MCHC 29.7 (L) 30.0 - 36.0 g/dL   RDW 17.0 (H) 11.5 - 15.5 %   Platelets 220 150 - 400 K/uL   nRBC 1.0 (H) 0.0 - 0.2 %    Comment: Performed at [facility], [address] [place], [place] [address]  Basic metabolic panel     Status: Abnormal   Collection Time: 11/18/21  4:59 AM  Result Value Ref Range   Sodium 137 135 - 145 mmol/L   Potassium 3.4 (L) 3.5 - 5.1 mmol/L   Chloride 102 98 - 111 mmol/L   CO2 26 22 - 32 mmol/L   Glucose, Bld 202 (H) 70 - 99 mg/dL    Comment: Glucose reference range applies only to samples taken after fasting for at least 8 hours.   BUN 20 6 - 20 mg/dL   Creatinine, Ser 0.94 0.44 - 1.00 mg/dL   Calcium 9.0 8.9 - 10.3 mg/dL   GFR, Estimated >60 >60 mL/min    Comment: (NOTE) Calculated using the CKD-EPI Creatinine Equation (2021)    Anion gap 9 5 - 15    Comment: Performed at [facility] [address], [place], [place] [address]  Magnesium     Status: None   Collection Time: 11/18/21  4:59 AM  Result Value Ref Range   Magnesium 2.2 1.7 - 2.4 mg/dL    Comment: Performed at [facility] [address], [place], [place] [address]  Glucose, capillary     Status: Abnormal   Collection Time: 11/18/21  7:32 AM  Result Value Ref Range   Glucose-Capillary 198 (H) 70 - 99 mg/dL    Comment: Glucose reference range applies only to samples taken after fasting for at least 8 hours.  Glucose, capillary     Status: Abnormal   Collection Time: 11/18/21 12:10 PM  Result  Value Ref Range    Glucose-Capillary 258 (H) 70 - 99 mg/dL    Comment: Glucose reference range applies only to samples taken after fasting for at least 8 hours.  I-STAT 7, (LYTES, BLD GAS, ICA, H+H)     Status: Abnormal   Collection Time: 11/18/21 12:20 PM  Result Value Ref Range   pH, Arterial 7.367 7.35 - 7.45   pCO2 arterial 48.1 (H) 32 - 48 mmHg   pO2, Arterial 59 (L) 83 - 108 mmHg   Bicarbonate 27.7 20.0 - 28.0 mmol/L   TCO2 29 22 - 32 mmol/L   O2 Saturation 90 %   Acid-Base Excess 2.0 0.0 - 2.0 mmol/L   Sodium 136 135 - 145 mmol/L   Potassium 4.2 3.5 - 5.1 mmol/L   Calcium, Ion 1.30 1.15 - 1.40 mmol/L   HCT 32.0 (L) 36.0 - 46.0 %   Hemoglobin 10.9 (L) 12.0 - 15.0 g/dL   Patient temperature 98.0 F    Sample type ARTERIAL   Glucose, capillary     Status: Abnormal   Collection Time: 11/18/21  4:20 PM  Result Value Ref Range   Glucose-Capillary 205 (H) 70 - 99 mg/dL    Comment: Glucose reference range applies only to samples taken after fasting for at least 8 hours.  Glucose, capillary     Status: Abnormal   Collection Time: 11/18/21  7:38 PM  Result Value Ref Range   Glucose-Capillary 162 (H) 70 - 99 mg/dL    Comment: Glucose reference range applies only to samples taken after fasting for at least 8 hours.  Glucose, capillary     Status: Abnormal   Collection Time: 11/18/21 11:39 PM  Result Value Ref Range   Glucose-Capillary 285 (H) 70 - 99 mg/dL    Comment: Glucose reference range applies only to samples taken after fasting for at least 8 hours.  Triglycerides     Status: Abnormal   Collection Time: 11/19/21  3:28 AM  Result Value Ref Range   Triglycerides 540 (H) <150 mg/dL    Comment: Performed at [facility] [address] [place], [place] [address]  CBC     Status: Abnormal   Collection Time: 11/19/21  3:28 AM  Result Value Ref Range   WBC 14.2 (H) 4.0 - 10.5 K/uL   RBC 3.47 (L) 3.87 - 5.11 MIL/uL   Hemoglobin 9.7 (L) 12.0 - 15.0 g/dL   HCT 31.9 (L) 36.0 - 46.0 %   MCV  91.9 80.0 - 100.0 fL   MCH 28.0 26.0 - 34.0 pg   MCHC 30.4 30.0 - 36.0 g/dL   RDW 17.2 (H) 11.5 - 15.5 %   Platelets 226 150 - 400 K/uL   nRBC 1.2 (H) 0.0 - 0.2 %    Comment: Performed at [facility], [place] [address], [place], [place] [address]  Basic metabolic panel     Status: Abnormal   Collection Time: 11/19/21  3:28 AM  Result Value Ref Range   Sodium 138 135 - 145 mmol/L   Potassium 3.9 3.5 - 5.1 mmol/L   Chloride 103 98 - 111 mmol/L   CO2 27 22 - 32 mmol/L   Glucose, Bld 270 (H) 70 - 99 mg/dL    Comment: Glucose reference range applies only to samples taken after fasting for at least 8 hours.   BUN 26 (H) 6 - 20 mg/dL   Creatinine, Ser 0.90 0.44 - 1.00 mg/dL   Calcium 8.9 8.9 -  10.3 mg/dL   GFR, Estimated >60 >60 mL/min    Comment: (NOTE) Calculated using the CKD-EPI Creatinine Equation (2021)    Anion gap 8 5 - 15    Comment: Performed at [facility], [place] [address] [place], [place] [address]  I-STAT 7, (LYTES, BLD GAS, ICA, H+H)     Status: Abnormal   Collection Time: 11/19/21  3:29 AM  Result Value Ref Range   pH, Arterial 7.375 7.35 - 7.45   pCO2 arterial 52.0 (H) 32 - 48 mmHg   pO2, Arterial 65 (L) 83 - 108 mmHg   Bicarbonate 30.3 (H) 20.0 - 28.0 mmol/L   TCO2 32 22 - 32 mmol/L   O2 Saturation 91 %   Acid-Base Excess 4.0 (H) 0.0 - 2.0 mmol/L   Sodium 137 135 - 145 mmol/L   Potassium 4.1 3.5 - 5.1 mmol/L   Calcium, Ion 1.29 1.15 - 1.40 mmol/L   HCT 30.0 (L) 36.0 - 46.0 %   Hemoglobin 10.2 (L) 12.0 - 15.0 g/dL   Patient temperature 99.1 F    Sample type ARTERIAL   Glucose, capillary     Status: Abnormal   Collection Time: 11/19/21  3:33 AM  Result Value Ref Range   Glucose-Capillary 215 (H) 70 - 99 mg/dL    Comment: Glucose reference range applies only to samples taken after fasting for at least 8 hours.  Glucose, capillary     Status: Abnormal   Collection Time: 11/19/21  7:37 AM  Result Value Ref Range   Glucose-Capillary 319 (H) 70 - 99  mg/dL    Comment: Glucose reference range applies only to samples taken after fasting for at least 8 hours.    Recent Results (from the past 240 hour(s))  Urine Culture     Status: Abnormal   Collection Time: 11/12/21  3:36 PM   Specimen: In/Out Cath Urine  Result Value Ref Range Status   Specimen Description IN/OUT CATH URINE  Final   Special Requests   Final    NONE Performed at [facility], [address] [place], [place] [address]    Culture (A)  Final    1,000 COLONIES/mL ENTEROBACTER CLOACAE 3,000 COLONIES/mL PSEUDOMONAS AERUGINOSA    Report Status 11/15/2021 FINAL  Final   Organism ID, Bacteria ENTEROBACTER CLOACAE (A)  Final   Organism ID, Bacteria PSEUDOMONAS AERUGINOSA (A)  Final      Susceptibility   Enterobacter cloacae - MIC*    CEFAZOLIN >=64 RESISTANT Resistant     CEFEPIME <=0.12 SENSITIVE Sensitive     CIPROFLOXACIN <=0.25 SENSITIVE Sensitive     GENTAMICIN <=1 SENSITIVE Sensitive     IMIPENEM <=0.25 SENSITIVE Sensitive     NITROFURANTOIN 32 SENSITIVE Sensitive     TRIMETH/SULFA <=20 SENSITIVE Sensitive     PIP/TAZO <=4 SENSITIVE Sensitive     * 1,000 COLONIES/mL ENTEROBACTER CLOACAE   Pseudomonas aeruginosa - MIC*    CEFTAZIDIME 4 SENSITIVE Sensitive     CIPROFLOXACIN <=0.25 SENSITIVE Sensitive     GENTAMICIN <=1 SENSITIVE Sensitive     IMIPENEM 2 SENSITIVE Sensitive     PIP/TAZO 8 SENSITIVE Sensitive     CEFEPIME 2 SENSITIVE Sensitive     * 3,000 COLONIES/mL PSEUDOMONAS AERUGINOSA  Respiratory (~20 pathogens) panel by PCR     Status: None   Collection Time: 11/12/21  3:37 PM   Specimen: Nasopharyngeal Swab; Respiratory  Result Value Ref Range Status   Adenovirus NOT DETECTED NOT DETECTED Final  Coronavirus 229E NOT DETECTED NOT DETECTED Final    Comment: (NOTE) The Coronavirus on the Respiratory Panel, DOES NOT test for the novel  Coronavirus (2019 nCoV)    Coronavirus HKU1 NOT DETECTED NOT DETECTED Final   Coronavirus NL63 NOT DETECTED  NOT DETECTED Final   Coronavirus OC43 NOT DETECTED NOT DETECTED Final   Metapneumovirus NOT DETECTED NOT DETECTED Final   Rhinovirus / Enterovirus NOT DETECTED NOT DETECTED Final   Influenza A NOT DETECTED NOT DETECTED Final   Influenza B NOT DETECTED NOT DETECTED Final   Parainfluenza Virus 1 NOT DETECTED NOT DETECTED Final   Parainfluenza Virus 2 NOT DETECTED NOT DETECTED Final   Parainfluenza Virus 3 NOT DETECTED NOT DETECTED Final   Parainfluenza Virus 4 NOT DETECTED NOT DETECTED Final   Respiratory Syncytial Virus NOT DETECTED NOT DETECTED Final   Bordetella pertussis NOT DETECTED NOT DETECTED Final   Bordetella Parapertussis NOT DETECTED NOT DETECTED Final   Chlamydophila pneumoniae NOT DETECTED NOT DETECTED Final   Mycoplasma pneumoniae NOT DETECTED NOT DETECTED Final    Comment: Performed at [facility], [place] [address] [place], [place] [address]  Blood Culture (routine x 2)     Status: None   Collection Time: 11/12/21  4:23 PM   Specimen: Site Not Specified; Blood  Result Value Ref Range Status   Specimen Description SITE NOT SPECIFIED  Final   Special Requests   Final    BOTTLES DRAWN AEROBIC AND ANAEROBIC Blood Culture adequate volume   Culture   Final    NO GROWTH 5 DAYS Performed at [facility], [place] [address] [place], [place] [address]    Report Status 11/17/2021 FINAL  Final  Resp Panel by RT-PCR (Flu A&B, Covid) Nasopharyngeal Swab     Status: None   Collection Time: 11/12/21  4:25 PM   Specimen: Nasopharyngeal Swab; Nasopharyngeal(NP) swabs in vial transport medium  Result Value Ref Range Status   SARS Coronavirus 2 by RT PCR NEGATIVE NEGATIVE Final    Comment: (NOTE) SARS-CoV-2 target nucleic acids are NOT DETECTED.  The SARS-CoV-2 RNA is generally detectable in upper respiratory specimens during the acute phase of infection. The lowest concentration of SARS-CoV-2 viral copies this assay can detect is 138 copies/mL. A negative result does not  preclude SARS-Cov-2 infection and should not be used as the sole basis for treatment or other patient management decisions. A negative result may occur with  improper specimen collection/handling, submission of specimen other than nasopharyngeal swab, presence of viral mutation(s) within the areas targeted by this assay, and inadequate number of viral copies(<138 copies/mL). A negative result must be combined with clinical observations, patient history, and epidemiological information. The expected result is Negative.  Fact Sheet for Patients:  EntrepreneurPulse.com.au  Fact Sheet for Healthcare Providers:  IncredibleEmployment.be  This test is no t yet approved or cleared by the [place] FDA and  has been authorized for detection and/or diagnosis of SARS-CoV-2 by FDA under an Emergency Use Authorization (EUA). This EUA will remain  in effect (meaning this test can be used) for the duration of the COVID-19 declaration under Section 564(b)(1) of the Act, 21 U.S.C.section 360bbb-3(b)(1), unless the authorization is terminated  or revoked sooner.       Influenza A by PCR NEGATIVE NEGATIVE Final   Influenza B by PCR NEGATIVE NEGATIVE Final    Comment: (NOTE) The Xpert Xpress SARS-CoV-2/FLU/RSV plus assay is intended as an aid in the diagnosis of influenza from Nasopharyngeal swab specimens  and should not be used as a sole basis for treatment. Nasal washings and aspirates are unacceptable for Xpert Xpress SARS-CoV-2/FLU/RSV testing.  Fact Sheet for Patients: EntrepreneurPulse.com.au  Fact Sheet for Healthcare Providers: IncredibleEmployment.be  This test is not yet approved or cleared by the [place] FDA and has been authorized for detection and/or diagnosis of SARS-CoV-2 by FDA under an Emergency Use Authorization (EUA). This EUA will remain in effect (meaning this test can be used) for the  duration of the COVID-19 declaration under Section 564(b)(1) of the Act, 21 U.S.C. section 360bbb-3(b)(1), unless the authorization is terminated or revoked.  Performed at [facility], [place] [address] [place], [place] [address]   Blood Culture (routine x 2)     Status: None   Collection Time: 11/12/21  4:44 PM   Specimen: Site Not Specified; Blood  Result Value Ref Range Status   Specimen Description SITE NOT SPECIFIED  Final   Special Requests   Final    BOTTLES DRAWN AEROBIC AND ANAEROBIC Blood Culture results may not be optimal due to an excessive volume of blood received in culture bottles   Culture   Final    NO GROWTH 5 DAYS Performed at [facility], [place] [address], [place], [place] [address]    Report Status 11/17/2021 FINAL  Final  MRSA Next Gen by PCR, Nasal     Status: Abnormal   Collection Time: 11/12/21  7:36 PM   Specimen: Nasal Mucosa; Nasal Swab  Result Value Ref Range Status   MRSA by PCR Next Gen DETECTED (A) NOT DETECTED Final    Comment: RESULT CALLED TO, READ BACK BY AND VERIFIED WITH: V [email or web address]  11/12/21 [place] (NOTE) The GeneXpert MRSA Assay (FDA approved for NASAL specimens only), is one component of a comprehensive MRSA colonization surveillance program. It is not intended to diagnose MRSA infection nor to guide or monitor treatment for MRSA infections. Test performance is not FDA approved in patients less than 61 years old. Performed at [facility], [place] [address], [place], [place] [address]   Culture, Respiratory w Gram Stain     Status: None   Collection Time: 11/13/21  2:09 AM   Specimen: Bronchoalveolar Lavage; Respiratory  Result Value Ref Range Status   Specimen Description BRONCHIAL ALVEOLAR LAVAGE  Final   Special Requests NONE  Final   Gram Stain   Final    MODERATE WBC PRESENT, PREDOMINANTLY PMN FEW GRAM POSITIVE COCCI IN PAIRS AND CHAINS Performed at [facility], [address], [place], [place] [address]     Culture FEW METHICILLIN RESISTANT STAPHYLOCOCCUS AUREUS  Final   Report Status 11/15/2021 FINAL  Final   Organism ID, Bacteria METHICILLIN RESISTANT STAPHYLOCOCCUS AUREUS  Final      Susceptibility   Methicillin resistant staphylococcus aureus - MIC*    CIPROFLOXACIN >=8 RESISTANT Resistant     ERYTHROMYCIN >=8 RESISTANT Resistant     GENTAMICIN <=0.5 SENSITIVE Sensitive     OXACILLIN >=4 RESISTANT Resistant     TETRACYCLINE <=1 SENSITIVE Sensitive     VANCOMYCIN 1 SENSITIVE Sensitive     TRIMETH/SULFA <=10 SENSITIVE Sensitive     CLINDAMYCIN <=0.25 SENSITIVE Sensitive     RIFAMPIN <=0.5 SENSITIVE Sensitive     Inducible Clindamycin NEGATIVE Sensitive     * FEW METHICILLIN RESISTANT STAPHYLOCOCCUS AUREUS  CSF culture w Stat Gram Stain     Status: None   Collection Time: 11/15/21  8:02 AM   Specimen: CSF;  Cerebrospinal Fluid  Result Value Ref Range Status   Specimen Description CSF  Final   Special Requests NONE  Final   Gram Stain   Final    WBC PRESENT,BOTH PMN AND MONONUCLEAR NO ORGANISMS SEEN CYTOSPIN SMEAR    Culture   Final    NO GROWTH Performed at [facility], [address] [place], [place] [address]    Report Status 11/18/2021 FINAL  Final  Acid Fast Smear (AFB)     Status: None   Collection Time: 11/15/21  9:23 AM   Specimen: CSF; Cerebrospinal Fluid  Result Value Ref Range Status   AFB Specimen Processing Direct Inoculation  Final   Acid Fast Smear Negative  Final    Comment: (NOTE) Performed At: [facility] [place], [place] [id number] Rush Farmer MD IR:[id number]    Source (AFB) CSF  Final    Comment: Performed at [facility], [place] [address], [place], [place] [address]  Culture, fungus without smear     Status: None (Preliminary result)   Collection Time: 11/15/21  9:24 AM   Specimen: CSF; Other  Result Value Ref Range Status   Specimen Description CSF  Final   Special Requests Normal  Final   Culture   Final     NO FUNGUS ISOLATED AFTER 3 DAYS Performed at [facility], [address] [place], [place] [address]    Report Status PENDING  Incomplete    Lipid Panel Recent Labs    11/19/21 0328  TRIG 540*    Studies/Results: MR LIVER W WO CONTRAST  Result Date: 11/18/2021 CLINICAL DATA:  Rule out metastatic disease or abscess in liver EXAM: MRI ABDOMEN WITHOUT AND WITH CONTRAST TECHNIQUE: Multiplanar multisequence MR imaging of the abdomen was performed both before and after the administration of intravenous contrast. CONTRAST:  4m GADAVIST GADOBUTROL 1 MMOL/ML IV SOLN COMPARISON:  CT 11/12/2021 FINDINGS: Lower chest: Dense consolidation in the RIGHT lower lobe increased from comparison exam. Hepatobiliary: No intrahepatic biliary duct dilatation. The gallbladder is distended distended to 4.2 cm. No gallstones evident. Common bile duct normal caliber. No lesion identified in the LEFT hepatic lobe along the falciform ligament site of concern on comparison CT. Postcontrast enhanced imaging demonstrates some hyper perfusion to the inferior RIGHT hepatic lobe (image 86/series 1200). This persist on the more delayed imaging. Pancreas: Normal pancreatic parenchymal intensity. No ductal dilatation or inflammation. Spleen: There is persistent hyper perfusion to peripheral regions of the spleen (image 26/series 12004. This persists on the more delayed sequences. Adrenal glands normal.  Solitary LEFT kidney noted. Adrenals/urinary tract: Adrenal glands normal.  Solitary LEFT kidney Stomach/Bowel: Stomach normal.  LEFT abdominal wall ostomy Vascular/Lymphatic: Abdominal aortic normal caliber. No retroperitoneal periportal lymphadenopathy. Musculoskeletal: No aggressive osseous lesion IMPRESSION: 1. Interval increase in dense atelectasis/consolidation of the RIGHT lower lobe. 2. No worrisome lesions within the liver. Hypoperfusion to the subcapsular RIGHT hepatic lobe is favored benign vascular phenomena. 3.  Distended gallbladder may relate to fasting state. 4. Persistent region of hypoperfusion to the spleen suggests splenic infarctions. 5. Solitary LEFT kidney without complication. Electronically Signed   By: SSuzy BouchardM.D.   On: 11/18/2021 20:06   Overnight EEG with video  Result Date: 11/17/2021 YLora Havens MD     11/18/2021  7:19 AM Patient Name: DLORISA SCHEIDMRN: [id number] Attending: PLora HavensReferring Physician/Provider: LKerney Elbe MD Duration: 11/16/2021 1127 to 11/17/2021 1127  Patient history: 56year old woman with  a history of cervical cancer, status post nephrectomy 2 months ago, colostomy, CAD, diabetes, depression, hypertension, remote history of stroke without residual deficits who was brought in by EMS for altered mental status.  EEG to evaluate for seizure.  Level of alertness: obtunded  AEDs during EEG study: propofol, LEV  Technical aspects: This EEG study was done with scalp electrodes positioned according to the 10-20 International system of electrode placement. Electrical activity was acquired at a sampling rate of _0  and reviewed with a high frequency filter of _1  and a low frequency filter of _2 . EEG data were recorded continuously and digitally stored.  Description: EEG showed continuous generalized and lateralized right hemisphere low amplitude 3 to 6 Hz theta-delta slowing.  Generalized periodic discharges with triphasic morphology were noted every 3 to 4 seconds.  Hyperventilation and photic stimulation were not performed.   Patient event button was pressed on 11/17/2021 at 0953 for unclear reasons.  Concomitant EEG before, during and after the event did not show any EEG to suggest seizure.  ABNORMALITY -Periodic discharges with triphasic morphology, generalized -Continuous slow, generalized and lateralized right hemisphere  IMPRESSION: This study showed generalized periodic discharges with triphasic morphology every 3 to 4 seconds.  This EEG  pattern is on the ictal-interictal continuum with low potential for seizures.  It can also be seen due to toxic-metabolic causes like cefepime toxicity, hyperammonemia, postcardiac arrest.  Additionally study is suggestive of cortical dysfunction in right hemisphere likely secondary to underlying structural abnormality as well as moderate to severe diffuse encephalopathy, nonspecific etiology.  No seizures were seen throughout the recording. Patient event button was pressed on 11/17/2021 at 0953 for unclear reasons without concomitant EEG change.  This was most likely not an epileptic event.  Priyanka [name]    Medications: Scheduled:  chlorhexidine gluconate (MEDLINE KIT)  15 mL Mouth Rinse BID   Chlorhexidine Gluconate Cloth  6 each Topical Daily   docusate  100 mg Per Tube BID   fentaNYL (SUBLIMAZE) injection  50 mcg Intravenous Once   furosemide  40 mg Intravenous Once   heparin injection (subcutaneous)  5,000 Units Subcutaneous Q8H   insulin aspart  0-9 Units Subcutaneous Q4H   insulin aspart  3 Units Subcutaneous Q4H   mouth rinse  15 mL Mouth Rinse 10 times per day   pantoprazole sodium  40 mg Per Tube Daily   polyethylene glycol  17 g Per Tube Daily   sodium chloride flush  3 mL Intravenous Once   Continuous:  sodium chloride     sodium chloride 5 mL/hr at 11/15/21 0803   sodium chloride     ceFEPime (MAXIPIME) IV 2 g (11/19/21 0840)   dexmedetomidine (PRECEDEX) IV infusion 0.4 mcg/kg/hr (11/19/21 0917)   feeding supplement (VITAL AF 1.2 CAL) 1,000 mL (11/19/21 0106)   fentaNYL infusion INTRAVENOUS 50 mcg/hr (11/19/21 0830)   levETIRAcetam 1,500 mg (11/19/21 0919)   linezolid (ZYVOX) IV Stopped (11/18/21 2349)   metronidazole 500 mg (11/19/21 0912)   norepinephrine (LEVOPHED) Adult infusion 2 mcg/min (11/19/21 0937)   propofol (DIPRIVAN) infusion 10 mcg/kg/min (11/19/21 0830)   Assessment: 56 year old woman with a history of cervical cancer, status post nephrectomy 2 months ago,  colostomy, chronic occlusion of the left common carotid artery, CAD, diabetes, depression, hypertension, remote history of stroke without residual deficits who was brought in by EMS for altered mental status. She had had a flulike illness for a week PTA and was found confused on 2/19. The patient had acute PEA  arrest while in the ED on Sunday (2/19) with CPR time of 2 minutes. Diagnosed with acute respiratory failure with hypoxemia and was intubated at that time. MRI brain performed on Tuesday revealed numerous (> 50) widely distributed lesions in the cerebral hemispheres as well as the cerebellum.  - Exam today (2/26) on lower levels of sedation (fentanyl and propofol gtt) relative to yesterday reveals an unresponsive patient with reactive pupils, intact blink reflexes, roving EOM and no responses to pain.  - LTM EEG,  11/17/2021 1127 to 11/18/2021 0730, to assess for seizure activity after CCM noticed some twitching in arm: Findings of continuous slowing, generalized and lateralized to the right hemisphere. This study is suggestive of cortical dysfunction in right hemisphere likely secondary to underlying structural abnormality as well as moderate to severe diffuse encephalopathy, nonspecific to etiology.  No seizures or definite epileptiform discharges were seen throughout the recording - MRI brain with and without contrast: Numerous (> 50) small, round DWI+ lesions are scattered throughout the brain parenchyma, infratentorially and supratentorially, some with ring-like morphologies. On FLAIR images, several of the lesions exhibit adjacent vasogenic edema. Some of the lesions exhibit corresponding T1-hypointensity. A small subset of the lesions exhibit enhancement on the post-contrast images. Many lesions show evidence of mild associated hemorrhage. Restricted diffusion in the left lateral ventricle likely due to ventriculitis. Overall appearance is most consistent with multiple cerebral abscesses, particularly  given the white count of 27. DDx for underlying pathogen includes bacterial (including tuberculosis), fungal (histoplasmosis, aspergillosis), protozoan (toxoplasmosis), parasitic (neurocysticercosis). Lower on the DDx would be an atypical presentation of CNS inflammatory disease such as MS or CNS lupus, or metastatic disease.    -WBC 22.4 > 17.6 > 16.3 > 14.2 -CSF from LP showed: cell count markedly abnormal with WBC 585 of which 84% were segmented neutrophils. Glucose 21 (low), Total protein 191 (high). Consistent with bacterial meningitis. -Cryptococcal antigen negative, Acid Fast Smear negative, remainder of infectious labs In process Bacterial culture with no growth x 3 days. AFB culture pending. CSF fungal culture with no fungus isolated after 3 days. Initial gram stain on fresh CSF was negative except for WBCs.  -Quantiferon Gold TB test: pending - Liver MRI recommended by ID: No worrisome lesions within the liver. Persistent region of hypoperfusion to the spleen suggests splenic infarctions.   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 On linezolid, cefepime and Flagyl. Per ID, linezolid to cover MRSA pneumonia, Listeria, and Nocardia as well as VRE coverage. - Follow up on remaining CSF studies including VDRL, fungal stain, final fungal and bacterial cultures pending.  - TEE has been ordered and is pending. To be performed due to concerns of possible SBE - Continue Keppra at 1500 mg BID - Wean sedation as tolerated  35 minutes spent in the neurological evaluation and management of this critically ill patient.   Addendum: Repeat CT of brain: 1. Relatively well-defined focus of hypoattenuation in the superior, medial right frontal lobe and adjacent right parietal lobe, consistent with recent infarction, occurring since the brain MRI dated 11/14/2021. 2. Several small hypoattenuating lesions noted in the basal ganglia on the left, cerebral hemispheres and cerebellar  hemispheres, corresponding to the lesions noted on the prior brain MRI. Differential diagnosis consistent with abscesses or metastatic disease. 3. Old left superior frontal lobe infarct stable from the prior head CT.  Images personally reviewed and discussed with CCM.   Additional 10 minutes of critical care time.    LOS: 7 days   _0   signed: Dr. Kerney Elbe 11/19/2021  9:34 AM

## 2021-11-20 ENCOUNTER — Inpatient Hospital Stay (HOSPITAL_COMMUNITY): Payer: Medicaid Other

## 2021-11-20 ENCOUNTER — Inpatient Hospital Stay: Payer: Self-pay

## 2021-11-20 DIAGNOSIS — R569 Unspecified convulsions: Secondary | ICD-10-CM

## 2021-11-20 DIAGNOSIS — J9601 Acute respiratory failure with hypoxia: Secondary | ICD-10-CM | POA: Diagnosis not present

## 2021-11-20 DIAGNOSIS — Z9911 Dependence on respirator [ventilator] status: Secondary | ICD-10-CM | POA: Diagnosis not present

## 2021-11-20 DIAGNOSIS — G060 Intracranial abscess and granuloma: Secondary | ICD-10-CM | POA: Diagnosis not present

## 2021-11-20 LAB — GLUCOSE, CAPILLARY
Glucose-Capillary: 241 mg/dL — ABNORMAL HIGH (ref 70–99)
Glucose-Capillary: 256 mg/dL — ABNORMAL HIGH (ref 70–99)
Glucose-Capillary: 235 mg/dL — ABNORMAL HIGH (ref 70–99)
Glucose-Capillary: 183 mg/dL — ABNORMAL HIGH (ref 70–99)
Glucose-Capillary: 277 mg/dL — ABNORMAL HIGH (ref 70–99)
Glucose-Capillary: 286 mg/dL — ABNORMAL HIGH (ref 70–99)

## 2021-11-20 LAB — BASIC METABOLIC PANEL
Anion gap: 8 (ref 5–15)
BUN: 36 mg/dL — ABNORMAL HIGH (ref 6–20)
CO2: 31 mmol/L (ref 22–32)
Calcium: 8.8 mg/dL — ABNORMAL LOW (ref 8.9–10.3)
Chloride: 100 mmol/L (ref 98–111)
Creatinine, Ser: 0.88 mg/dL (ref 0.44–1.00)
GFR, Estimated: >60 mL/min (ref >60)
Glucose, Bld: 280 mg/dL — ABNORMAL HIGH (ref 70–99)
Potassium: 3.6 mmol/L (ref 3.5–5.1)
Sodium: 139 mmol/L (ref 135–145)

## 2021-11-20 LAB — POCT I-STAT 7, (LYTES, BLD GAS, ICA,H+H)
Acid-Base Excess: 7 mmol/L — ABNORMAL HIGH (ref 0.0–2.0)
Bicarbonate: 33.4 mmol/L — ABNORMAL HIGH (ref 20.0–28.0)
Calcium, Ion: 1.27 mmol/L (ref 1.15–1.40)
HCT: 31 % — ABNORMAL LOW (ref 36.0–46.0)
Hemoglobin: 10.5 g/dL — ABNORMAL LOW (ref 12.0–15.0)
O2 Saturation: 95 %
Patient temperature: 37.2
Potassium: 3.5 mmol/L (ref 3.5–5.1)
Sodium: 138 mmol/L (ref 135–145)
TCO2: 35 mmol/L — ABNORMAL HIGH (ref 22–32)
pCO2 arterial: 55.9 mmHg — ABNORMAL HIGH (ref 32–48)
pH, Arterial: 7.385 (ref 7.35–7.45)
pO2, Arterial: 80 mmHg — ABNORMAL LOW (ref 83–108)

## 2021-11-20 LAB — CBC
HCT: 32.4 % — ABNORMAL LOW (ref 36.0–46.0)
Hemoglobin: 9.7 g/dL — ABNORMAL LOW (ref 12.0–15.0)
MCH: 28 pg (ref 26.0–34.0)
MCHC: 29.9 g/dL — ABNORMAL LOW (ref 30.0–36.0)
MCV: 93.6 fL (ref 80.0–100.0)
Platelets: 204 10*3/uL (ref 150–400)
RBC: 3.46 MIL/uL — ABNORMAL LOW (ref 3.87–5.11)
RDW: 17.6 % — ABNORMAL HIGH (ref 11.5–15.5)
WBC: 12.1 10*3/uL — ABNORMAL HIGH (ref 4.0–10.5)
nRBC: 0.7 % — ABNORMAL HIGH (ref 0.0–0.2)

## 2021-11-20 LAB — CSF IGG: IgG, CSF: 22.5 mg/dL — ABNORMAL HIGH (ref 0.0–6.7)

## 2021-11-20 MED ORDER — SODIUM CHLORIDE 0.9 % IV SOLN
2.0000 g | Freq: Three times a day (TID) | INTRAVENOUS | Status: DC
  Administered 2021-11-20 – 2021-11-23 (×8): 2 g via INTRAVENOUS
  Filled 2021-11-20 (×12): qty 40

## 2021-11-20 MED ORDER — LACOSAMIDE 200 MG/20ML IV SOLN
100.0000 mg | Freq: Two times a day (BID) | INTRAVENOUS | Status: DC
Start: 2021-11-21 — End: 2021-12-10
  Administered 2021-11-21 – 2021-12-10 (×40): 100 mg via INTRAVENOUS
  Filled 2021-11-20 (×44): qty 10

## 2021-11-20 MED ORDER — SODIUM CHLORIDE 0.9 % IV SOLN
200.0000 mg | Freq: Once | INTRAVENOUS | Status: AC
  Administered 2021-11-20: 200 mg via INTRAVENOUS
  Filled 2021-11-20: qty 20

## 2021-11-20 MED ORDER — INSULIN GLARGINE-YFGN 100 UNIT/ML ~~LOC~~ SOLN
10.0000 [IU] | Freq: Every day | SUBCUTANEOUS | Status: DC
  Administered 2021-11-20: 10 [IU] via SUBCUTANEOUS
  Filled 2021-11-20: qty 0.1

## 2021-11-20 MED ORDER — SODIUM CHLORIDE 0.9% FLUSH
10.0000 mL | Freq: Two times a day (BID) | INTRAVENOUS | Status: DC
  Administered 2021-11-20 – 2021-11-21 (×2): 10 mL

## 2021-11-20 MED ORDER — FUROSEMIDE 10 MG/ML IJ SOLN
40.0000 mg | Freq: Once | INTRAMUSCULAR | Status: AC
  Administered 2021-11-20: 40 mg via INTRAVENOUS
  Filled 2021-11-20: qty 4

## 2021-11-20 MED ORDER — SODIUM CHLORIDE 0.9% FLUSH: 10.0000 mL | INTRAVENOUS | Status: DC | PRN

## 2021-11-20 MED ORDER — HYDRALAZINE HCL 20 MG/ML IJ SOLN
5.0000 mg | INTRAMUSCULAR | Status: DC | PRN
  Administered 2021-11-21: 5 mg via INTRAVENOUS
  Filled 2021-11-20: qty 1

## 2021-11-20 MED ORDER — INSULIN ASPART 100 UNIT/ML IJ SOLN
8.0000 [IU] | INTRAMUSCULAR | Status: DC
Start: 2021-11-20 — End: 2021-11-24
  Administered 2021-11-20 – 2021-11-24 (×20): 8 [IU] via SUBCUTANEOUS

## 2021-11-20 MED ORDER — INSULIN GLARGINE-YFGN 100 UNIT/ML ~~LOC~~ SOLN
10.0000 [IU] | Freq: Two times a day (BID) | SUBCUTANEOUS | Status: DC
  Administered 2021-11-20 – 2021-11-21 (×2): 10 [IU] via SUBCUTANEOUS
  Filled 2021-11-20 (×3): qty 0.1

## 2021-11-20 MED ORDER — POTASSIUM CHLORIDE 20 MEQ PO PACK
40.0000 meq | PACK | Freq: Once | ORAL | Status: AC
  Administered 2021-11-20: 40 meq
  Filled 2021-11-20: qty 2

## 2021-11-20 NOTE — Plan of Care (Signed)
  Problem: Nutrition: Goal: Adequate nutrition will be maintained Outcome: Progressing   

## 2021-11-20 NOTE — Progress Notes (Addendum)
NAME:  Kristen Murphy, MRN:  [id number], DOB:  April 06, 1966, LOS: 8 ADMISSION DATE:  11/12/2021, CONSULTATION DATE: 11/12/2021 REFERRING MD: Dr. Langston Masker, CHIEF COMPLAINT: PEA arrest  History of Present Illness:  56 year old woman with a history of tobacco use, cervical cancer ([facility]), post nephrectomy and colostomy, CAD with LAD stent 01/2019, diabetes, depression, hypertension, remote CVA without known deficits.  Also with a history of substance abuse.  Brought in by her son to the ED 2/19 with fever, altered mental status and slurred speech. Apparently has had a flulike illness for a week, was found confused on 2/19.  Some suspicion by son that she may have been taking more prescription narcotics, also taking many NSAIDs per his report.  She was disoriented in the ED, febrile 103F, hyperglycemic. In the ED she apparently experienced acute hypoxemia, had some blood from her mouth, question emesis versus hemoptysis.  Devolved to PEA and required emergent ET intubation and CPR.  ROSC after 2 minutes.  OG tube placed with bright red blood obtained.  Chest x-ray with mild right hilar prominence, more notable on postintubation film.  No infiltrates or effusions She has received empiric antibiotics, is receiving 1 unit PRBC, is about to start Protonix infusion. Hemodynamically stabilized, mechanically ventilated.  Pertinent  Medical History   Past Medical History:  Diagnosis Date   Anemia    Anxiety    Arthritis    Asthma    Cervical cancer ([place])    Chronic kidney disease    Coronary artery disease    Depression    Diabetes mellitus without complication (HCC)    Dyspnea    Headache    Heart murmur    Hx of adenomatous polyp of colon    Hypertension    Rectovaginal fistula    Seizures ([place])    Stercoral ulcer of rectum 12/2020   Stroke [facility])     Significant Hospital Events: Including procedures, antibiotic start and stop dates in addition to other pertinent events   Intubated. Head CT 2/19  > no acute CT findings, old left basal ganglia lacunar, old left frontal cortical and subcortical infarct MR Brain 11/14/21 - Numerous lesions with mild associated hemorrhage. Mild edema. Concerned for brain abscesses 2/22 LP consistent with bacterial meningitis. Respiratory Cx +MRSA 2/25 MR Liver with no lesions, hypoperfusion to the subcapsular right hepatic lobe, favor benign vascular phenomena, distended gallbladder, persistent region of hypoperfusion to the   Interim History / Subjective:  Off vasopressors this AM. Sent for STAT Head CT per Neurology  Objective   Blood pressure (!) 143/59, pulse 68, temperature 99.3 F (37.4 C), resp. rate 19, height 5\' 2"  (1.575 m), weight 74.1 kg, SpO2 94 %.    Vent Mode: PRVC FiO2 (%):  [40 %-60 %] 60 % Set Rate:  [28 bmp] 28 bmp Vt Set:  [400 mL] 400 mL PEEP:  [10 cmH20] 10 cmH20 Plateau Pressure:  [20 cmH20-21 cmH20] 21 cmH20   Intake/Output Summary (Last 24 hours) at 11/20/2021 0808 Last data filed at 11/20/2021 0645 Gross per 24 hour  Intake 2371.89 ml  Output 3150 ml  Net -778.11 ml   Filed Weights   11/18/21 0500 11/19/21 0500 11/20/21 0343  Weight: 82 kg 80.7 kg 74.1 kg    Physical Exam: General: Critically ill-appearing, no acute distress HEENT: [place], AT, ETT in place Respiratory: Diminished and clear to auscultation bilaterally.  No crackles, wheezing or rales Cardiovascular: RRR, -M/R/G, no JVD GI: BS+, soft, nontender, colostomy bag in place >  clean/intact Extremities:-Edema,-tenderness Neuro: Sedated, pupils equal 2 mm, +cough/gag, no motor movement on left hemibody, right upper and lower withdrawal with nailbed pressure  GU: Foley in place  Resolved Hospital Problem list   Hyponatremia  Assessment & Plan:   Multiple brain abscesses Bacterial meningitis Acute metabolic encephalopathy secondary to above Repeat CT head 2/19 neg for acute findings.  EEG and LTM 2/25 neg for seizures.  Consider also substances as son  concerned about possible narcotic use.  UDS only positive for benzo. MRI brain 2/21 numerous lesions with mild associated hemorrhage and edema. Neurosurgery reviewed imaging and patient not a good candidate for lesion aspiration. Plan -Continue antibiotics: Linezolid (2/22-), Cefepime and Flagyl -Appreciate Neuro recs. Remains on LTM -Appreciate ID recs -F/u final LP studies, quantiferon TB, toxo, oligoclonal bands, IgG. Neg cryptococcal ag -Plan for STAT CT head per Neurology  -TEE pending   -Droplet precautions -Contact precautions for MRSA  Acute respiratory failure with hypoxemia secondary to necrotizing MRSA pneumonia Also urine culture polymicrobial with 1k Enterobacter and 3K Pseudomonas  Plan -Full vent support. Wean FIO2/PEEP. PRVC 8 cc/kg. Wean FIO2/PEEP for SpO2 goal 88-95%. -VAP  -PRN ABG and CXR  -Continue antibiotics as above.  -Repeat Lasix for goal net neg daily >> 40 mg ordered for this AM  -PAD protocol: Precedex and Fentanyl for RASS goal -1/-2   Septic shock secondary  necrotizing MRSA pneumonia, bacterial meningitis - intermittent levophed use at night however able to wean off during the day. Leukocytosis improving and clinically pneumonia seems to be improving Acute cardiopulmonary arrest, PEA arrest Hypertension History of CAD Plan -Currently off vasopressors.  -ID following  -Holding amlodipine, metoprolol, lisinopril until stabilizing -Hold aspirin in setting of GI blood loss  Acute blood loss/chronic anemia - Hg stable, no further bleeds History of NSAID abuse. EGD blood clots in gastric fundus/body, erosive gastropathy, no stigmata of bleeding but OG tube trauma in body Plan -Continue PPI  -Trend CBC  Diabetes with hyperglycemia Plan -CBGs, SSI if > 180 -TF Coverage  -Start Long Acting insulin  -Home metformin on hold -Home Farxiga on hold  Hypokalemia Hypocalcemia - corrected to wnl in setting hypoalbuminemia Plan - Trend BMP - Replace  electrolytes as indicated   Best Practice (right click and "Reselect all SmartList Selections" daily)   Diet/type: tubefeeds DVT prophylaxis: prophylactic heparin  GI prophylaxis: PPI Lines: Central line >> Will place PICC given need for prolonged antibiotics  Foley:  N/A Code Status:  full code Last date of multidisciplinary goals of care discussion pending   Labs   CBC: Recent Labs  Lab 11/16/21 0415 11/16/21 0749 11/17/21 0104 11/17/21 0904 11/18/21 0459 11/18/21 1220 11/19/21 0328 11/19/21 0329 11/20/21 0353 11/20/21 0416  WBC 17.6*  --  17.5*  --  16.3*  --  14.2*  --  12.1*  --   HGB 9.2*   < > 9.7*   < > 9.7* 10.9* 9.7* 10.2* 9.7* 10.5*  HCT 30.8*   < > 33.0*   < > 32.7* 32.0* 31.9* 30.0* 32.4* 31.0*  MCV 92.2  --  92.4  --  93.4  --  91.9  --  93.6  --   PLT 226  --  231  --  220  --  226  --  204  --    < > = values in this interval not displayed.    Basic abnormalities panel: Recent Labs  Lab 11/14/21 0824 11/14/21 0826 11/14/21 1700 11/14/21 1838 11/15/21 0449 11/15/21 1700  11/16/21 0415 11/17/21 0104 11/17/21 0904 11/18/21 0459 11/18/21 1220 11/19/21 0328 11/19/21 0329 11/19/21 2010 11/20/21 0353 11/20/21 0416  NA  --    < >  --    < > 132*  --    < > 135   < > 137   < > 138 137 138 139 138  K  --    < >  --    < > 3.9  --    < > 3.3*   < > 3.4*   < > 3.9 4.1 3.8 3.6 3.5  CL  --    < >  --   --  99  --    < > 102  --  102  --  103  --  100 100  --   CO2  --    < >  --   --  22  --    < > 24  --  26  --  27  --  30 31  --   GLUCOSE  --    < >  --   --  131*  --    < > 172*  --  202*  --  270*  --  323* 280*  --   BUN  --    < >  --   --  15  --    < > 17  --  20  --  26*  --  34* 36*  --   CREATININE  --    < >  --   --  0.87  --    < > 0.90  --  0.94  --  0.90  --  0.91 0.88  --   CALCIUM  --    < >  --   --  8.5*  --    < > 9.1  --  9.0  --  8.9  --  8.9 8.8*  --   MG 1.7  --  1.6*  --  2.0 1.9  --  1.9  --  2.2  --   --   --   --   --   --    PHOS 3.7  --  2.8  --  2.7 2.4*  --   --   --   --   --   --   --   --   --   --    < > = values in this interval not displayed.   GFR: Estimated Creatinine Clearance: 67.3 mL/min (by C-G formula based on SCr of 0.88 mg/dL). Recent Labs  Lab 11/14/21 0824 11/15/21 0449 11/17/21 0104 11/17/21 0905 11/18/21 0459 11/19/21 0328 11/20/21 0353  WBC  --    < > 17.5*  --  16.3* 14.2* 12.1*  LATICACIDVEN 1.2  --   --  1.1  --   --   --    < > = values in this interval not displayed.    Liver Function Tests: Recent Labs  Lab 11/16/21 0415  AST 19  ALT 25  ALKPHOS 202*  BILITOT 0.4  PROT 5.4*  ALBUMIN <1.5*   No results for input(s): LIPASE, AMYLASE in the last 168 hours. No results for input(s): AMMONIA in the last 168 hours.  ABG    Component Value Date/Time   PHART 7.385 11/20/2021 0416   PCO2ART 55.9 (H) 11/20/2021 0416   PO2ART 80 (L) 11/20/2021 0416   HCO3 33.4 (H) 11/20/2021 0416  TCO2 35 (H) 11/20/2021 0416   ACIDBASEDEF 1.0 11/17/2021 0904   O2SAT 95 11/20/2021 0416     Coagulation Profile: No results for input(s): INR, PROTIME in the last 168 hours.   Cardiac Enzymes: No results for input(s): CKTOTAL, CKMB, CKMBINDEX, TROPONINI in the last 168 hours.  HbA1C: Hgb A1c MFr Bld  Date/Time Value Ref Range Status  11/12/2021 07:31 PM 7.2 (H) 4.8 - 5.6 % Final    Comment:    (NOTE) Pre diabetes:          5.7%-6.4%  Diabetes:              >6.4%  Glycemic control for   <7.0% adults with diabetes   02/19/2019 03:32 AM 9.2 (H) 4.8 - 5.6 % Final    Comment:    (NOTE) Pre diabetes:          5.7%-6.4% Diabetes:              >6.4% Glycemic control for   <7.0% adults with diabetes     CBG: Recent Labs  Lab 11/19/21 1202 11/19/21 1708 11/19/21 2008 11/19/21 2242 11/20/21 0329  GLUCAP 364* 356* 320* 336* 241*   Critical care time: 45 minutes   The patient is critically ill with multiple organ systems failure and requires high complexity decision  making for assessment and support, frequent evaluation and titration of therapies, application of advanced monitoring technologies and extensive interpretation of multiple databases.  Independent Critical Care Time: 32 Minutes.   Hayden Pedro, AGACNP-BC [facility]  PCCM Pgr: [phone]

## 2021-11-20 NOTE — Progress Notes (Signed)
Inpatient Diabetes Program Recommendations  AACE/ADA: New Consensus Statement on Inpatient Glycemic Control (2015)  Target Ranges:  Prepandial:   less than 140 mg/dL      Peak postprandial:   less than 180 mg/dL (1-2 hours)      Critically ill patients:  140 - 180 mg/dL   Lab Results  Component Value Date   GLUCAP 256 (H) 11/20/2021   HGBA1C 7.2 (H) 11/12/2021    Review of Glycemic Control  Latest Reference Range & Units 11/19/21 20:08 11/19/21 22:42 11/20/21 03:29 11/20/21 08:32  Glucose-Capillary 70 - 99 mg/dL 320 (H) 336 (H) 241 (H) 256 (H)  (H): Data is abnormally high Diabetes history: Type 2 DM Outpatient Diabetes medications: Metformin 1000 mg BID (NT), Januvia 50 mg QD, Farxiga 10 mg QD Current orders for Inpatient glycemic control: Novolog 6 units Q4H, Novolog 0-15 units Q4H, Semglee 10 units QD  Inpatient Diabetes Program Recommendations:    Consider increasing tube feed coverage to Novolog 10 units Q4H once TF resumed today (to be stopped or held in the event tube feed stopped).   Thanks, Bronson Curb, MSN, RNC-OB Diabetes Coordinator [phone] (8a-5p)

## 2021-11-20 NOTE — Progress Notes (Signed)
Peripherally Inserted Central Catheter Placement  The IV Nurse has discussed with the patient and/or persons authorized to consent for the patient, the purpose of this procedure and the potential benefits and risks involved with this procedure.  The benefits include less needle sticks, lab draws from the catheter, and the patient may be discharged home with the catheter. Risks include, but not limited to, infection, bleeding, blood clot (thrombus formation), and puncture of an artery; nerve damage and irregular heartbeat and possibility to perform a PICC exchange if needed/ordered by physician.  Alternatives to this procedure were also discussed.  Bard Power PICC patient education guide, fact sheet on infection prevention and patient information card has been provided to patient /or left at bedside.    PICC Placement Documentation  PICC Double Lumen 40/98/11 Right Basilic 36 cm 0 cm (Active)  Indication for Insertion or Continuance of Line Prolonged intravenous therapies 11/20/21 1906  Exposed Catheter (cm) 0 cm 11/20/21 1906  Site Assessment Clean, Dry, Intact 11/20/21 1906  Lumen #1 Status Flushed;Blood return noted;Saline locked 11/20/21 1906  Lumen #2 Status Flushed;Blood return noted;Saline locked 11/20/21 1906  Dressing Type Transparent 11/20/21 1906  Dressing Status Antimicrobial disc in place 11/20/21 1906  Dressing Change Due 11/27/21 11/20/21 1906       Scotty Court 11/20/2021, 7:09 PM

## 2021-11-20 NOTE — Progress Notes (Signed)
Subjective: On fentanyl 276mg/hr and precedex off since @ 2250 last night  Objective: Current vital signs: BP (!) 143/59    Pulse 68    Temp 99.3 F (37.4 C)    Resp 19    Ht _0  (1.575 m)    Wt 74.1 kg    SpO2 94%    BMI 29.88 kg/m  Vital signs in last 24 hours: Temp:  [98.6 F (37 C)-100.3 F (37.9 C)] 99.3 F (37.4 C) (02/27 0700) Pulse Rate:  [52-108] 68 (02/27 0700) Resp:  [14-31] 19 (02/27 0700) BP: (109-164)/(49-79) 143/59 (02/27 0700) SpO2:  [85 %-99 %] 94 % (02/27 0700) Arterial Line BP: (84-154)/(51-148) 108/76 (02/27 0400) FiO2 (%):  [40 %-60 %] 60 % (02/27 0336) Weight:  [74.1 kg] 74.1 kg (02/27 0343)  Intake/Output from previous day: 02/26 0701 - 02/27 0700 In: 2586.9 [I.V.:338; NG/GT:1193; IV Piggyback:1055.8] Out: 3500 [Urine:2990; Emesis/NG output:60; Stool:450] Intake/Output this shift: No intake/output data recorded. Nutritional status:  Diet Order             Diet NPO time specified  Diet effective midnight                  HEENT: [place]/AT. Neck supple. Lungs: Intubated  Neurologic Exam: Performed while on fentanyl 2200  Ment: Eyes are closed initially, but will remain partially open after lids are passively elevated. Obtunded/sedated and non-responsive to noxious stimuli. Does not gaze towards or away from visual stimuli. Does not respond to voice or other auditory stimuli. Patient does not follow commands. No attempts to communicate.  CN: Pupils are 2 mm and only right eye sluggishly reactive to light. This is new on exam. Conjugate slow roving EOM to right, left and upwards. No blink to threat. Blinks to eyelid stimulation bilaterally. Face flaccidly symmetric.   Motor/Sensory: All 4 extremities are flaccid with only slight finger ?purposeful movement to noxious stimuli (sedated) Other: No jerking, twitching or myoclonus seen.    Lab Results: Results for orders placed or performed during the hospital encounter of 11/12/21 (from the past 48  hour(s))  Glucose, capillary     Status: Abnormal   Collection Time: 11/18/21 12:10 PM  Result Value Ref Range   Glucose-Capillary 258 (H) 70 - 99 mg/dL    Comment: Glucose reference range applies only to samples taken after fasting for at least 8 hours.  I-STAT 7, (LYTES, BLD GAS, ICA, H+H)     Status: Abnormal   Collection Time: 11/18/21 12:20 PM  Result Value Ref Range   pH, Arterial 7.367 7.35 - 7.45   pCO2 arterial 48.1 (H) 32 - 48 mmHg   pO2, Arterial 59 (L) 83 - 108 mmHg   Bicarbonate 27.7 20.0 - 28.0 mmol/L   TCO2 29 22 - 32 mmol/L   O2 Saturation 90 %   Acid-Base Excess 2.0 0.0 - 2.0 mmol/L   Sodium 136 135 - 145 mmol/L   Potassium 4.2 3.5 - 5.1 mmol/L   Calcium, Ion 1.30 1.15 - 1.40 mmol/L   HCT 32.0 (L) 36.0 - 46.0 %   Hemoglobin 10.9 (L) 12.0 - 15.0 g/dL   Patient temperature 98.0 F    Sample type ARTERIAL   Glucose, capillary     Status: Abnormal   Collection Time: 11/18/21  4:20 PM  Result Value Ref Range   Glucose-Capillary 205 (H) 70 - 99 mg/dL    Comment: Glucose reference range applies only to samples taken after fasting for at least 8 hours.  Glucose, capillary     Status: Abnormal   Collection Time: 11/18/21  7:38 PM  Result Value Ref Range   Glucose-Capillary 162 (H) 70 - 99 mg/dL    Comment: Glucose reference range applies only to samples taken after fasting for at least 8 hours.  Glucose, capillary     Status: Abnormal   Collection Time: 11/18/21 11:39 PM  Result Value Ref Range   Glucose-Capillary 285 (H) 70 - 99 mg/dL    Comment: Glucose reference range applies only to samples taken after fasting for at least 8 hours.  Triglycerides     Status: Abnormal   Collection Time: 11/19/21  3:28 AM  Result Value Ref Range   Triglycerides 540 (H) <150 mg/dL    Comment: Performed at [facility] [address] [place], [place] [address]  CBC     Status: Abnormal   Collection Time: 11/19/21  3:28 AM  Result Value Ref Range   WBC 14.2 (H) 4.0 - 10.5  K/uL   RBC 3.47 (L) 3.87 - 5.11 MIL/uL   Hemoglobin 9.7 (L) 12.0 - 15.0 g/dL   HCT 31.9 (L) 36.0 - 46.0 %   MCV 91.9 80.0 - 100.0 fL   MCH 28.0 26.0 - 34.0 pg   MCHC 30.4 30.0 - 36.0 g/dL   RDW 17.2 (H) 11.5 - 15.5 %   Platelets 226 150 - 400 K/uL   nRBC 1.2 (H) 0.0 - 0.2 %    Comment: Performed at [facility], [place] [address], [place], [place] [address]  Basic metabolic panel     Status: Abnormal   Collection Time: 11/19/21  3:28 AM  Result Value Ref Range   Sodium 138 135 - 145 mmol/L   Potassium 3.9 3.5 - 5.1 mmol/L   Chloride 103 98 - 111 mmol/L   CO2 27 22 - 32 mmol/L   Glucose, Bld 270 (H) 70 - 99 mg/dL    Comment: Glucose reference range applies only to samples taken after fasting for at least 8 hours.   BUN 26 (H) 6 - 20 mg/dL   Creatinine, Ser 0.90 0.44 - 1.00 mg/dL   Calcium 8.9 8.9 - 10.3 mg/dL   GFR, Estimated >60 >60 mL/min    Comment: (NOTE) Calculated using the CKD-EPI Creatinine Equation (2021)    Anion gap 8 5 - 15    Comment: Performed at [facility] [address], [place], [place] [address]  I-STAT 7, (LYTES, BLD GAS, ICA, H+H)     Status: Abnormal   Collection Time: 11/19/21  3:29 AM  Result Value Ref Range   pH, Arterial 7.375 7.35 - 7.45   pCO2 arterial 52.0 (H) 32 - 48 mmHg   pO2, Arterial 65 (L) 83 - 108 mmHg   Bicarbonate 30.3 (H) 20.0 - 28.0 mmol/L   TCO2 32 22 - 32 mmol/L   O2 Saturation 91 %   Acid-Base Excess 4.0 (H) 0.0 - 2.0 mmol/L   Sodium 137 135 - 145 mmol/L   Potassium 4.1 3.5 - 5.1 mmol/L   Calcium, Ion 1.29 1.15 - 1.40 mmol/L   HCT 30.0 (L) 36.0 - 46.0 %   Hemoglobin 10.2 (L) 12.0 - 15.0 g/dL   Patient temperature 99.1 F    Sample type ARTERIAL   Glucose, capillary     Status: Abnormal   Collection Time: 11/19/21  3:33 AM  Result Value Ref Range   Glucose-Capillary 215 (H) 70 - 99 mg/dL  Comment: Glucose reference range applies only to samples taken after fasting for at least 8 hours.  Glucose, capillary      Status: Abnormal   Collection Time: 11/19/21  7:37 AM  Result Value Ref Range   Glucose-Capillary 319 (H) 70 - 99 mg/dL    Comment: Glucose reference range applies only to samples taken after fasting for at least 8 hours.  Glucose, capillary     Status: Abnormal   Collection Time: 11/19/21 12:02 PM  Result Value Ref Range   Glucose-Capillary 364 (H) 70 - 99 mg/dL    Comment: Glucose reference range applies only to samples taken after fasting for at least 8 hours.  Glucose, capillary     Status: Abnormal   Collection Time: 11/19/21  5:08 PM  Result Value Ref Range   Glucose-Capillary 356 (H) 70 - 99 mg/dL    Comment: Glucose reference range applies only to samples taken after fasting for at least 8 hours.  Glucose, capillary     Status: Abnormal   Collection Time: 11/19/21  8:08 PM  Result Value Ref Range   Glucose-Capillary 320 (H) 70 - 99 mg/dL    Comment: Glucose reference range applies only to samples taken after fasting for at least 8 hours.  Basic metabolic panel     Status: Abnormal   Collection Time: 11/19/21  8:10 PM  Result Value Ref Range   Sodium 138 135 - 145 mmol/L   Potassium 3.8 3.5 - 5.1 mmol/L   Chloride 100 98 - 111 mmol/L   CO2 30 22 - 32 mmol/L   Glucose, Bld 323 (H) 70 - 99 mg/dL    Comment: Glucose reference range applies only to samples taken after fasting for at least 8 hours.   BUN 34 (H) 6 - 20 mg/dL   Creatinine, Ser 0.91 0.44 - 1.00 mg/dL   Calcium 8.9 8.9 - 10.3 mg/dL   GFR, Estimated >60 >60 mL/min    Comment: (NOTE) Calculated using the CKD-EPI Creatinine Equation (2021)    Anion gap 8 5 - 15    Comment: Performed at [facility] [address] [place], [place] [address]  Glucose, capillary     Status: Abnormal   Collection Time: 11/19/21 10:42 PM  Result Value Ref Range   Glucose-Capillary 336 (H) 70 - 99 mg/dL    Comment: Glucose reference range applies only to samples taken after fasting for at least 8 hours.  Glucose, capillary      Status: Abnormal   Collection Time: 11/20/21  3:29 AM  Result Value Ref Range   Glucose-Capillary 241 (H) 70 - 99 mg/dL    Comment: Glucose reference range applies only to samples taken after fasting for at least 8 hours.  CBC     Status: Abnormal   Collection Time: 11/20/21  3:53 AM  Result Value Ref Range   WBC 12.1 (H) 4.0 - 10.5 K/uL   RBC 3.46 (L) 3.87 - 5.11 MIL/uL   Hemoglobin 9.7 (L) 12.0 - 15.0 g/dL   HCT 32.4 (L) 36.0 - 46.0 %   MCV 93.6 80.0 - 100.0 fL   MCH 28.0 26.0 - 34.0 pg   MCHC 29.9 (L) 30.0 - 36.0 g/dL   RDW 17.6 (H) 11.5 - 15.5 %   Platelets 204 150 - 400 K/uL   nRBC 0.7 (H) 0.0 - 0.2 %    Comment: Performed at [facility], [place] [address] [place], [place] [address]  Basic metabolic panel  Status: Abnormal   Collection Time: 11/20/21  3:53 AM  Result Value Ref Range   Sodium 139 135 - 145 mmol/L   Potassium 3.6 3.5 - 5.1 mmol/L   Chloride 100 98 - 111 mmol/L   CO2 31 22 - 32 mmol/L   Glucose, Bld 280 (H) 70 - 99 mg/dL    Comment: Glucose reference range applies only to samples taken after fasting for at least 8 hours.   BUN 36 (H) 6 - 20 mg/dL   Creatinine, Ser 0.88 0.44 - 1.00 mg/dL   Calcium 8.8 (L) 8.9 - 10.3 mg/dL   GFR, Estimated >60 >60 mL/min    Comment: (NOTE) Calculated using the CKD-EPI Creatinine Equation (2021)    Anion gap 8 5 - 15    Comment: Performed at [facility] [address] [place], [place] [address]  I-STAT 7, (LYTES, BLD GAS, ICA, H+H)     Status: Abnormal   Collection Time: 11/20/21  4:16 AM  Result Value Ref Range   pH, Arterial 7.385 7.35 - 7.45   pCO2 arterial 55.9 (H) 32 - 48 mmHg   pO2, Arterial 80 (L) 83 - 108 mmHg   Bicarbonate 33.4 (H) 20.0 - 28.0 mmol/L   TCO2 35 (H) 22 - 32 mmol/L   O2 Saturation 95 %   Acid-Base Excess 7.0 (H) 0.0 - 2.0 mmol/L   Sodium 138 135 - 145 mmol/L   Potassium 3.5 3.5 - 5.1 mmol/L   Calcium, Ion 1.27 1.15 - 1.40 mmol/L   HCT 31.0 (L) 36.0 - 46.0 %   Hemoglobin 10.5  (L) 12.0 - 15.0 g/dL   Patient temperature 37.2 C    Collection site RADIAL, ALLEN'S TEST ACCEPTABLE    Drawn by RT    Sample type ARTERIAL     Recent Results (from the past 240 hour(s))  Urine Culture     Status: Abnormal   Collection Time: 11/12/21  3:36 PM   Specimen: In/Out Cath Urine  Result Value Ref Range Status   Specimen Description IN/OUT CATH URINE  Final   Special Requests   Final    NONE Performed at [facility], [address] [place], [place] [address]    Culture (A)  Final    1,000 COLONIES/mL ENTEROBACTER CLOACAE 3,000 COLONIES/mL PSEUDOMONAS AERUGINOSA    Report Status 11/15/2021 FINAL  Final   Organism ID, Bacteria ENTEROBACTER CLOACAE (A)  Final   Organism ID, Bacteria PSEUDOMONAS AERUGINOSA (A)  Final      Susceptibility   Enterobacter cloacae - MIC*    CEFAZOLIN >=64 RESISTANT Resistant     CEFEPIME <=0.12 SENSITIVE Sensitive     CIPROFLOXACIN <=0.25 SENSITIVE Sensitive     GENTAMICIN <=1 SENSITIVE Sensitive     IMIPENEM <=0.25 SENSITIVE Sensitive     NITROFURANTOIN 32 SENSITIVE Sensitive     TRIMETH/SULFA <=20 SENSITIVE Sensitive     PIP/TAZO <=4 SENSITIVE Sensitive     * 1,000 COLONIES/mL ENTEROBACTER CLOACAE   Pseudomonas aeruginosa - MIC*    CEFTAZIDIME 4 SENSITIVE Sensitive     CIPROFLOXACIN <=0.25 SENSITIVE Sensitive     GENTAMICIN <=1 SENSITIVE Sensitive     IMIPENEM 2 SENSITIVE Sensitive     PIP/TAZO 8 SENSITIVE Sensitive     CEFEPIME 2 SENSITIVE Sensitive     * 3,000 COLONIES/mL PSEUDOMONAS AERUGINOSA  Respiratory (~20 pathogens) panel by PCR     Status: None   Collection Time: 11/12/21  3:37 PM   Specimen: Nasopharyngeal Swab;  Respiratory  Result Value Ref Range Status   Adenovirus NOT DETECTED NOT DETECTED Final   Coronavirus 229E NOT DETECTED NOT DETECTED Final    Comment: (NOTE) The Coronavirus on the Respiratory Panel, DOES NOT test for the novel  Coronavirus (2019 nCoV)    Coronavirus HKU1 NOT DETECTED NOT DETECTED  Final   Coronavirus NL63 NOT DETECTED NOT DETECTED Final   Coronavirus OC43 NOT DETECTED NOT DETECTED Final   Metapneumovirus NOT DETECTED NOT DETECTED Final   Rhinovirus / Enterovirus NOT DETECTED NOT DETECTED Final   Influenza A NOT DETECTED NOT DETECTED Final   Influenza B NOT DETECTED NOT DETECTED Final   Parainfluenza Virus 1 NOT DETECTED NOT DETECTED Final   Parainfluenza Virus 2 NOT DETECTED NOT DETECTED Final   Parainfluenza Virus 3 NOT DETECTED NOT DETECTED Final   Parainfluenza Virus 4 NOT DETECTED NOT DETECTED Final   Respiratory Syncytial Virus NOT DETECTED NOT DETECTED Final   Bordetella pertussis NOT DETECTED NOT DETECTED Final   Bordetella Parapertussis NOT DETECTED NOT DETECTED Final   Chlamydophila pneumoniae NOT DETECTED NOT DETECTED Final   Mycoplasma pneumoniae NOT DETECTED NOT DETECTED Final    Comment: Performed at [facility], [place]. [address], [place], [place] [address]  Blood Culture (routine x 2)     Status: None   Collection Time: 11/12/21  4:23 PM   Specimen: Site Not Specified; Blood  Result Value Ref Range Status   Specimen Description SITE NOT SPECIFIED  Final   Special Requests   Final    BOTTLES DRAWN AEROBIC AND ANAEROBIC Blood Culture adequate volume   Culture   Final    NO GROWTH 5 DAYS Performed at [facility], [place] [address] [place], [place] [address]    Report Status 11/17/2021 FINAL  Final  Resp Panel by RT-PCR (Flu A&B, Covid) Nasopharyngeal Swab     Status: None   Collection Time: 11/12/21  4:25 PM   Specimen: Nasopharyngeal Swab; Nasopharyngeal(NP) swabs in vial transport medium  Result Value Ref Range Status   SARS Coronavirus 2 by RT PCR NEGATIVE NEGATIVE Final    Comment: (NOTE) SARS-CoV-2 target nucleic acids are NOT DETECTED.  The SARS-CoV-2 RNA is generally detectable in upper respiratory specimens during the acute phase of infection. The lowest concentration of SARS-CoV-2 viral copies this assay can detect is 138  copies/mL. A negative result does not preclude SARS-Cov-2 infection and should not be used as the sole basis for treatment or other patient management decisions. A negative result may occur with  improper specimen collection/handling, submission of specimen other than nasopharyngeal swab, presence of viral mutation(s) within the areas targeted by this assay, and inadequate number of viral copies(<138 copies/mL). A negative result must be combined with clinical observations, patient history, and epidemiological information. The expected result is Negative.  Fact Sheet for Patients:  EntrepreneurPulse.com.au  Fact Sheet for Healthcare Providers:  IncredibleEmployment.be  This test is no t yet approved or cleared by the [place] FDA and  has been authorized for detection and/or diagnosis of SARS-CoV-2 by FDA under an Emergency Use Authorization (EUA). This EUA will remain  in effect (meaning this test can be used) for the duration of the COVID-19 declaration under Section 564(b)(1) of the Act, 21 U.S.C.section 360bbb-3(b)(1), unless the authorization is terminated  or revoked sooner.       Influenza A by PCR NEGATIVE NEGATIVE Final   Influenza B by PCR NEGATIVE NEGATIVE Final    Comment: (NOTE) The Xpert Xpress  SARS-CoV-2/FLU/RSV plus assay is intended as an aid in the diagnosis of influenza from Nasopharyngeal swab specimens and should not be used as a sole basis for treatment. Nasal washings and aspirates are unacceptable for Xpert Xpress SARS-CoV-2/FLU/RSV testing.  Fact Sheet for Patients: EntrepreneurPulse.com.au  Fact Sheet for Healthcare Providers: IncredibleEmployment.be  This test is not yet approved or cleared by the [place] FDA and has been authorized for detection and/or diagnosis of SARS-CoV-2 by FDA under an Emergency Use Authorization (EUA). This EUA will remain in effect (meaning  this test can be used) for the duration of the COVID-19 declaration under Section 564(b)(1) of the Act, 21 U.S.C. section 360bbb-3(b)(1), unless the authorization is terminated or revoked.  Performed at [facility], [place] [address] [place], [place] [address]   Blood Culture (routine x 2)     Status: None   Collection Time: 11/12/21  4:44 PM   Specimen: Site Not Specified; Blood  Result Value Ref Range Status   Specimen Description SITE NOT SPECIFIED  Final   Special Requests   Final    BOTTLES DRAWN AEROBIC AND ANAEROBIC Blood Culture results may not be optimal due to an excessive volume of blood received in culture bottles   Culture   Final    NO GROWTH 5 DAYS Performed at [facility], [place] [address], [place], [place] [address]    Report Status 11/17/2021 FINAL  Final  MRSA Next Gen by PCR, Nasal     Status: Abnormal   Collection Time: 11/12/21  7:36 PM   Specimen: Nasal Mucosa; Nasal Swab  Result Value Ref Range Status   MRSA by PCR Next Gen DETECTED (A) NOT DETECTED Final    Comment: RESULT CALLED TO, READ BACK BY AND VERIFIED WITH: V [email or web address]  11/12/21 [place] (NOTE) The GeneXpert MRSA Assay (FDA approved for NASAL specimens only), is one component of a comprehensive MRSA colonization surveillance program. It is not intended to diagnose MRSA infection nor to guide or monitor treatment for MRSA infections. Test performance is not FDA approved in patients less than 60 years old. Performed at [facility], [place] [address] [place], [place] [address]   Culture, Respiratory w Gram Stain     Status: None   Collection Time: 11/13/21  2:09 AM   Specimen: Bronchoalveolar Lavage; Respiratory  Result Value Ref Range Status   Specimen Description BRONCHIAL ALVEOLAR LAVAGE  Final   Special Requests NONE  Final   Gram Stain   Final    MODERATE WBC PRESENT, PREDOMINANTLY PMN FEW GRAM POSITIVE COCCI IN PAIRS AND CHAINS Performed at [facility], [address], [place], [place] [address]    Culture FEW METHICILLIN RESISTANT STAPHYLOCOCCUS AUREUS  Final   Report Status 11/15/2021 FINAL  Final   Organism ID, Bacteria METHICILLIN RESISTANT STAPHYLOCOCCUS AUREUS  Final      Susceptibility   Methicillin resistant staphylococcus aureus - MIC*    CIPROFLOXACIN >=8 RESISTANT Resistant     ERYTHROMYCIN >=8 RESISTANT Resistant     GENTAMICIN <=0.5 SENSITIVE Sensitive     OXACILLIN >=4 RESISTANT Resistant     TETRACYCLINE <=1 SENSITIVE Sensitive     VANCOMYCIN 1 SENSITIVE Sensitive     TRIMETH/SULFA <=10 SENSITIVE Sensitive     CLINDAMYCIN <=0.25 SENSITIVE Sensitive     RIFAMPIN <=0.5 SENSITIVE Sensitive     Inducible Clindamycin NEGATIVE Sensitive     * FEW METHICILLIN RESISTANT STAPHYLOCOCCUS AUREUS  CSF culture w Stat Gram Stain  Status: None   Collection Time: 11/15/21  8:02 AM   Specimen: CSF; Cerebrospinal Fluid  Result Value Ref Range Status   Specimen Description CSF  Final   Special Requests NONE  Final   Gram Stain   Final    WBC PRESENT,BOTH PMN AND MONONUCLEAR NO ORGANISMS SEEN CYTOSPIN SMEAR    Culture   Final    NO GROWTH Performed at [facility], [address], [place], [place] [address]    Report Status 11/18/2021 FINAL  Final  Acid Fast Smear (AFB)     Status: None   Collection Time: 11/15/21  9:23 AM   Specimen: CSF; Cerebrospinal Fluid  Result Value Ref Range Status   AFB Specimen Processing Direct Inoculation  Final   Acid Fast Smear Negative  Final    Comment: (NOTE) Performed At: [facility] [place], [place] [id number] Rush Farmer MD [id number]    Source (AFB) CSF  Final    Comment: Performed at [facility], [place] [address], [place], [place] [address]  Culture, fungus without smear     Status: None (Preliminary result)   Collection Time: 11/15/21  9:24 AM   Specimen: CSF; Other  Result Value Ref Range Status   Specimen Description CSF  Final   Special Requests  Normal  Final   Culture   Final    NO GROWTH 4 DAYS Performed at [facility] [address], [place], [place] [address]    Report Status PENDING  Incomplete    Lipid Panel Recent Labs    11/19/21 0328  TRIG 540*     Studies/Results: CT HEAD WO CONTRAST (5MM)  Result Date: 11/19/2021 CLINICAL DATA:  Mental status change.  Unable to wake up. EXAM: CT HEAD WITHOUT CONTRAST TECHNIQUE: Contiguous axial images were obtained from the base of the skull through the vertex without intravenous contrast. RADIATION DOSE REDUCTION: This exam was performed according to the departmental dose-optimization program which includes automated exposure control, adjustment of the mA and/or kV according to patient size and/or use of iterative reconstruction technique. COMPARISON:  11/12/2021 FINDINGS: Brain: New area of hypoattenuation noted in the superior, posterior right frontal lobe extending to the adjacent right parietal lobe, consistent with a recent infarction. Hypoattenuation involving the superior, medial left frontal lobe is stable from the prior CT. There are several small rounded areas of hypoattenuation in the cerebellar hemispheres left basal ganglia and cerebral hemispheres not evident on the prior head CT, but consistent with a small lesions noted on the brain MRI from 11/14/2021. Ventricles are normal in size and configuration. No extra-axial masses or abnormal fluid collections. No intracranial hemorrhage. Vascular: No hyperdense vessel or unexpected calcification. Skull: Normal. Negative for fracture or focal lesion. Sinuses/Orbits: Globes and orbits are unremarkable. There is sinus disease with a right maxillary sinus air-fluid level, mild right maxillary, right frontal, bilateral ethmoid and right sphenoid sinus mucosal thickening. Small amount of right sphenoid sinus dependent fluid. Right and left mastoid air cell fluid. Other: None. IMPRESSION: 1. Relatively well-defined focus of  hypoattenuation in the superior, medial right frontal lobe and adjacent right parietal lobe, consistent with recent infarction, occurring since the brain MRI dated 11/14/2021. 2. Several small hypoattenuating lesions noted in the basal ganglia on the left, cerebral hemispheres and cerebellar hemispheres, corresponding to the lesions noted on the prior brain MRI. Differential diagnosis consistent with abscesses or metastatic disease. 3. 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 Electronically Signed   By: Lajean Manes M.D.   On: 11/19/2021 14:32   CT CHEST WO CONTRAST  Result Date: 11/19/2021 CLINICAL DATA:  Increased Levophed requirement. Slow to wean off ventilator. Unable to wake up. EXAM: CT CHEST WITHOUT CONTRAST TECHNIQUE: Multidetector CT imaging of the chest was performed following the standard protocol without IV contrast. RADIATION DOSE REDUCTION: This exam was performed according to the departmental dose-optimization program which includes automated exposure control, adjustment of the mA and/or kV according to patient size and/or use of iterative reconstruction technique. COMPARISON:  11/12/2021. FINDINGS: Cardiovascular: Heart is normal in size and configuration. Dense three-vessel coronary artery calcifications. No pericardial effusion. Is great vessels are normal in caliber. Stable aortic atherosclerotic calcifications. Mediastinum/Nodes: Endotracheal tube tip lies at the level of the upper Carina, above the origin of the right mainstem bronchus. Nasal/orogastric tube passes below the diaphragm well into the stomach, below the included field of view. No neck base masses or adenopathy. No mediastinal or hilar masses. Several prominent mediastinal lymph nodes, largest a subcarinal node, 1.4 cm in short axis. Nodes have increased compared to the prior CT. Trachea and esophagus are unremarkable. Lungs/Pleura: Numerous, small ill-defined nodular ground-glass opacities noted  throughout both lungs, predominating in the upper lobes, not evident on the previous CT. Masslike area of opacity noted in the right lower lobe is not convincingly changed, but is now partly obscured by right lower lobe atelectasis. There is opacity in the left lower lobe with depression of the oblique fissure posteriorly, also consistent with atelectasis. A component of this may reflect infection there is a small. Similar appearing area of opacity in the dependent left upper lobe lingula consistent with atelectasis and/or infection. Mild opacities noted in the anterior right middle lobe, new, consistent with infection and/or atelectasis. Trace left pleural fluid.  No pneumothorax. Upper Abdomen: Liver unremarkable. Subtle heterogeneous attenuation of the spleen with areas of relative decreased attenuation, nonspecific. Previous right nephrectomy. No acute findings. Musculoskeletal: No fracture or acute finding. No osteoblastic or osteolytic lesions. IMPRESSION: 1. Since the prior chest CT, numerous small ill-defined ground-glass nodular opacities have developed in both lungs. Multifocal infection is suspected. Findings could be inflammatory. 2. Bilateral lower lobe atelectasis, partly obscuring the right lower lobe cavitary consolidative process noted on the prior CT, which otherwise has not changed. Lung base opacities have significantly increased from the previous CT. New trace left pleural effusion. Small areas of atelectasis or infection noted in the right middle lobe and left upper lobe lingula, also new. 3. Stable coronary artery calcifications and aortic atherosclerosis. 4. Prominent mediastinal lymph nodes increased from the prior CT, reactive. Aortic Atherosclerosis (ICD10-I70.0). Electronically Signed   By: Lajean Manes M.D.   On: 11/19/2021 14:43   MR LIVER W WO CONTRAST  Result Date: 11/18/2021 CLINICAL DATA:  Rule out metastatic disease or abscess in liver EXAM: MRI ABDOMEN WITHOUT AND WITH  CONTRAST TECHNIQUE: Multiplanar multisequence MR imaging of the abdomen was performed both before and after the administration of intravenous contrast. CONTRAST:  31m GADAVIST GADOBUTROL 1 MMOL/ML IV SOLN COMPARISON:  CT 11/12/2021 FINDINGS: Lower chest: Dense consolidation in the RIGHT lower lobe increased from comparison exam. Hepatobiliary: No intrahepatic biliary duct dilatation. The gallbladder is distended distended to 4.2 cm. No gallstones evident. Common bile duct normal caliber. No lesion identified in the LEFT hepatic lobe along the falciform ligament site of concern on comparison CT. Postcontrast enhanced imaging demonstrates some hyper perfusion to the inferior RIGHT hepatic lobe (image  86/series 1200). This persist on the more delayed imaging. Pancreas: Normal pancreatic parenchymal intensity. No ductal dilatation or inflammation. Spleen: There is persistent hyper perfusion to peripheral regions of the spleen (image 26/series 12004. This persists on the more delayed sequences. Adrenal glands normal.  Solitary LEFT kidney noted. Adrenals/urinary tract: Adrenal glands normal.  Solitary LEFT kidney Stomach/Bowel: Stomach normal.  LEFT abdominal wall ostomy Vascular/Lymphatic: Abdominal aortic normal caliber. No retroperitoneal periportal lymphadenopathy. Musculoskeletal: No aggressive osseous lesion IMPRESSION: 1. Interval increase in dense atelectasis/consolidation of the RIGHT lower lobe. 2. No worrisome lesions within the liver. Hypoperfusion to the subcapsular RIGHT hepatic lobe is favored benign vascular phenomena. 3. Distended gallbladder may relate to fasting state. 4. Persistent region of hypoperfusion to the spleen suggests splenic infarctions. 5. Solitary LEFT kidney without complication. Electronically Signed   By: Suzy Bouchard M.D.   On: 11/18/2021 20:06    Medications: Scheduled:  chlorhexidine gluconate (MEDLINE KIT)  15 mL Mouth Rinse BID   Chlorhexidine Gluconate Cloth  6 each  Topical Daily   docusate  100 mg Per Tube BID   fentaNYL (SUBLIMAZE) injection  50 mcg Intravenous Once   heparin injection (subcutaneous)  5,000 Units Subcutaneous Q8H   insulin aspart  0-15 Units Subcutaneous Q4H   insulin aspart  6 Units Subcutaneous Q4H   mouth rinse  15 mL Mouth Rinse 10 times per day   pantoprazole sodium  40 mg Per Tube Daily   polyethylene glycol  17 g Per Tube Daily   potassium chloride  40 mEq Per Tube Once   sodium chloride flush  3 mL Intravenous Once   Continuous:  sodium chloride     sodium chloride 5 mL/hr at 11/15/21 0803   sodium chloride     ceFEPime (MAXIPIME) IV 2 g (11/20/21 0237)   dexmedetomidine (PRECEDEX) IV infusion Stopped (11/19/21 2255)   feeding supplement (VITAL AF 1.2 CAL) 1,000 mL (11/19/21 0106)   fentaNYL infusion INTRAVENOUS 200 mcg/hr (11/20/21 0542)   levETIRAcetam Stopped (11/19/21 2133)   linezolid (ZYVOX) IV Stopped (11/19/21 2243)   metronidazole 500 mg (11/20/21 0133)   norepinephrine (LEVOPHED) Adult infusion 2 mcg/min (11/19/21 1247)   propofol (DIPRIVAN) infusion Stopped (11/19/21 1119)   Assessment: 56 year old woman with a history of cervical cancer, status post nephrectomy 2 months ago, colostomy, chronic occlusion of the left common carotid artery, CAD, diabetes, depression, hypertension, remote history of stroke without residual deficits who was brought in by EMS for altered mental status. She had had a flulike illness for a week PTA and was found confused on 2/19. The patient had acute PEA arrest while in the ED on Sunday (2/19) with CPR time of 2 minutes. Diagnosed with acute respiratory failure with hypoxemia and was intubated at that time. MRI brain performed on Tuesday (2/21)revealed numerous (> 50) widely distributed lesions in the cerebral hemispheres as well as the cerebellum.  - Exam today (2/27) on lower levels of sedation (fentanyl gtt) relative to yesterday reveals an unresponsive patient however today with  only right reactive pupil, intact blink reflexes, roving EOM and slight response to pain with slight finger movement likely purposeful.  - LTM EEG,  11/17/2021 1127 to 11/18/2021 0730, to assess for seizure activity after CCM noticed some twitching in arm: Findings of continuous slowing, generalized and lateralized to the right hemisphere. This study is suggestive of cortical dysfunction in right hemisphere likely secondary to underlying structural abnormality as well as moderate to severe diffuse encephalopathy, nonspecific to etiology.  No seizures  or definite epileptiform discharges were seen throughout the recording. Reordered LT EEG to start today.  - MRI brain with and without contrast (2/21): Numerous (> 50) small, round DWI+ lesions are scattered throughout the brain parenchyma, infratentorially and supratentorially, some with ring-like morphologies. On FLAIR images, several of the lesions exhibit adjacent vasogenic edema. Some of the lesions exhibit corresponding T1-hypointensity. A small subset of the lesions exhibit enhancement on the post-contrast images. Many lesions show evidence of mild associated hemorrhage. Restricted diffusion in the left lateral ventricle likely due to ventriculitis. 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 DDx for underlying pathogen includes bacterial (including tuberculosis), fungal (histoplasmosis, aspergillosis), protozoan (toxoplasmosis), parasitic (neurocysticercosis). Lower on the DDx would be an atypical presentation of CNS inflammatory disease such as MS or CNS lupus, or metastatic disease.   - Repeat CT of brain (2/26):Relatively well-defined focus of hypoattenuation in the superior,medial right frontal lobe and adjacent right parietal lobe,consistent with recent infarction, occurring since the brain MRI dated 11/14/2021.Several small hypoattenuating lesions noted in the basal ganglia on the  left, cerebral hemispheres and cerebellar hemispheres, corresponding to the lesions noted on the prior brain MRI.Differential diagnosis consistent with abscesses or metastatic disease. Old left superior frontal lobe infarct stable from the prior head CT. -WBC 22.4 > 17.6 > 16.3 > 14.2 -CSF from LP showed: cell count markedly abnormal with WBC 585 of which 84% were segmented neutrophils. Glucose 21 (low), Total protein 191 (high). Consistent with bacterial meningitis. -Cryptococcal antigen negative, Acid Fast Smear negative, remainder of infectious labs In process Bacterial culture with no growth x 3 days. AFB culture pending. CSF fungal culture with no fungus isolated after 4 days. Initial gram stain on fresh CSF was negative except for WBCs.  -Quantiferon Gold TB test: pending - Liver MRI recommended by ID: No worrisome lesions within the liver. Persistent region of hypoperfusion to the spleen suggests splenic infarctions.   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 On linezolid, cefepime and Flagyl. Per ID, linezolid to cover MRSA pneumonia, Listeria, and Nocardia as well as VRE coverage. Cefepime can lower seizure threshold and if she continues to have seizures recommend changing cefepime to another appropriate abx.  - Follow up on remaining CSF studies including VDRL, fungal stain ( no growth 4 days), final fungal and bacterial cultures pending.  - TEE has been ordered and is pending. To be performed due to concerns of possible SBE - Continue Keppra at 1500 mg BID - Wean sedation as tolerated - repeat [facility] 11/20/21 to rule out hydrocephalus- not worrisome for hydrocephalus upon my review.  - LT EEG- pending  35 minutes spent in the neurological evaluation and management of this critically ill patient.     LOS: 8 days   Clarice Pole, Neuro NP  11/20/2021  7:43 AM

## 2021-11-20 NOTE — Progress Notes (Signed)
[facility] ADULT ICU REPLACEMENT PROTOCOL   The patient does apply for the [facility] Adult ICU Electrolyte Replacment Protocol based on the criteria listed below:   1.Exclusion criteria: TCTS patients, ECMO patients, and Dialysis patients 2. Is GFR >/= 30 ml/min? Yes.    Patient's GFR today is >60 3. Is SCr </= 2? Yes.   Patient's SCr is 0.88 mg/dL 4. Did SCr increase >/= 0.5 in 24 hours? No. 5.Pt's weight >40kg  Yes.   6. Abnormal electrolyte(s): K+3.6  7. Electrolytes replaced per protocol 8.  Call MD STAT for K+ </= 2.5, Phos </= 1, or Mag </= 1 Physician:  Kendell Bane 11/20/2021 6:51 AM

## 2021-11-20 NOTE — Progress Notes (Signed)
LTM EEG hooked up and running - no initial skin breakdown - push button tested - neuro notified. [facility] monitoring.  

## 2021-11-20 NOTE — Progress Notes (Signed)
Pharmacy Antibiotic Note  Kristen Murphy is a 56 y.o. female admitted on 11/12/2021 with septic shock, necrotizing MRSA PNA, bacterial meningitis, multiple brain abcesses.  Pharmacy has been consulted for Meropenem and Zyvox dosing.   Plan: - Start Meropenem 2g IV every 8 hours (CNS dosing) - Continue Zyvox 600 mg IV every 12 hours - Will continue to follow renal function, culture results, LOT, and antibiotic de-escalation plans   Height: _0  (157.5 cm) Weight: 74.1 kg (163 lb 5.8 oz) IBW/kg (Calculated) : 50.1  Temp (24hrs), Avg:99.2 F (37.3 C), Min:94.1 F (34.5 C), Max:100.3 F (37.9 C)  Recent Labs  Lab 11/14/21 0824 11/14/21 1400 11/15/21 0732 11/16/21 0415 11/16/21 2124 11/17/21 0104 11/17/21 0905 11/18/21 0459 11/19/21 0328 11/19/21 2010 11/20/21 0353  WBC  --    < >  --  17.6*  --  17.5*  --  16.3* 14.2*  --  12.1*  CREATININE  --    < >  --  0.87   < > 0.90  --  0.94 0.90 0.91 0.88  LATICACIDVEN 1.2  --   --   --   --   --  1.1  --   --   --   --   VANCOTROUGH  --   --  23*  --   --   --   --   --   --   --   --    < > = values in this interval not displayed.     Estimated Creatinine Clearance: 67.3 mL/min (by C-G formula based on SCr of 0.88 mg/dL).    Allergies  Allergen Reactions   Glimepiride Anaphylaxis    Has sulfa in it per pharmacy   Sulfa Antibiotics Anaphylaxis and Hives   Effexor [Venlafaxine] Hives    Antimicrobials this admission: Rocephin 2/19 >> 2/21 Acyclovir 2/19 >> 2/21 Vanc 2/19 >> 2/22 Cefepime 2/21 >> 2/27 Flagyl 2/21 >>2/27 Linezolid 2/22 >  Meropenem 2/27 >>  Dose adjustments this admission:  Microbiology results: 2/22 CSF - ngtd 2/19 BCx: neg 2/19 Ucx pseudomonas pan sens, enterobacter cefaz -R, S -to all others 2/20 BAL > few MRSA 2/19 MRSA PCR > detected  Thank you for allowing pharmacy to be a part of this patients care.  Alycia Rossetti, PharmD, BCPS Infectious Diseases Clinical Pharmacist 11/20/2021 11:10  AM   **Pharmacist phone directory can now be found on [email or web address] ([email or web address]).  Listed under [place].

## 2021-11-20 NOTE — Progress Notes (Signed)
[facility] for Infectious Disease   Reason for visit: Follow up on brain abscess  Interval History: CT scan yesterday with right frontal lobe and right parietal lobe infarction plus known abscesses.  Remains afebrile.  WBC 12.1.   Day 9 total antibiotics Day 7 vancomycin + cefepime + metronidazole  Physical Exam: Constitutional:  Vitals:   11/20/21 0847 11/20/21 1052  BP: (!) 189/53   Pulse: 80 77  Resp: (!) 28 (!) 28  Temp: 99.5 F (37.5 C) 98.8 F (37.1 C)  SpO2: 96% 94%  Patient does not respond Eyes: anicteric HENT: +ET Respiratory: respiratory effort on vent  Review of Systems: Unable to be assessed due to patient factors  Lab Results  Component Value Date   WBC 12.1 (H) 11/20/2021   HGB 10.5 (L) 11/20/2021   HCT 31.0 (L) 11/20/2021   MCV 93.6 11/20/2021   PLT 204 11/20/2021    Lab Results  Component Value Date   CREATININE 0.88 11/20/2021   BUN 36 (H) 11/20/2021   NA 138 11/20/2021   K 3.5 11/20/2021   CL 100 11/20/2021   CO2 31 11/20/2021    Lab Results  Component Value Date   ALT 25 11/16/2021   AST 19 11/16/2021   ALKPHOS 202 (H) 11/16/2021     Microbiology: Recent Results (from the past 240 hour(s))  Urine Culture     Status: Abnormal   Collection Time: 11/12/21  3:36 PM   Specimen: In/Out Cath Urine  Result Value Ref Range Status   Specimen Description IN/OUT CATH URINE  Final   Special Requests   Final    NONE Performed at [facility], [address] [place], [place] [address]    Culture (A)  Final    1,000 COLONIES/mL ENTEROBACTER CLOACAE 3,000 COLONIES/mL PSEUDOMONAS AERUGINOSA    Report Status 11/15/2021 FINAL  Final   Organism ID, Bacteria ENTEROBACTER CLOACAE (A)  Final   Organism ID, Bacteria PSEUDOMONAS AERUGINOSA (A)  Final      Susceptibility   Enterobacter cloacae - MIC*    CEFAZOLIN >=64 RESISTANT Resistant     CEFEPIME <=0.12 SENSITIVE Sensitive     CIPROFLOXACIN <=0.25 SENSITIVE Sensitive      GENTAMICIN <=1 SENSITIVE Sensitive     IMIPENEM <=0.25 SENSITIVE Sensitive     NITROFURANTOIN 32 SENSITIVE Sensitive     TRIMETH/SULFA <=20 SENSITIVE Sensitive     PIP/TAZO <=4 SENSITIVE Sensitive     * 1,000 COLONIES/mL ENTEROBACTER CLOACAE   Pseudomonas aeruginosa - MIC*    CEFTAZIDIME 4 SENSITIVE Sensitive     CIPROFLOXACIN <=0.25 SENSITIVE Sensitive     GENTAMICIN <=1 SENSITIVE Sensitive     IMIPENEM 2 SENSITIVE Sensitive     PIP/TAZO 8 SENSITIVE Sensitive     CEFEPIME 2 SENSITIVE Sensitive     * 3,000 COLONIES/mL PSEUDOMONAS AERUGINOSA  Respiratory (~20 pathogens) panel by PCR     Status: None   Collection Time: 11/12/21  3:37 PM   Specimen: Nasopharyngeal Swab; Respiratory  Result Value Ref Range Status   Adenovirus NOT DETECTED NOT DETECTED Final   Coronavirus 229E NOT DETECTED NOT DETECTED Final    Comment: (NOTE) The Coronavirus on the Respiratory Panel, DOES NOT test for the novel  Coronavirus (2019 nCoV)    Coronavirus HKU1 NOT DETECTED NOT DETECTED Final   Coronavirus NL63 NOT DETECTED NOT DETECTED Final   Coronavirus OC43 NOT DETECTED NOT DETECTED Final   Metapneumovirus NOT DETECTED NOT DETECTED Final   Rhinovirus /  Enterovirus NOT DETECTED NOT DETECTED Final   Influenza A NOT DETECTED NOT DETECTED Final   Influenza B NOT DETECTED NOT DETECTED Final   Parainfluenza Virus 1 NOT DETECTED NOT DETECTED Final   Parainfluenza Virus 2 NOT DETECTED NOT DETECTED Final   Parainfluenza Virus 3 NOT DETECTED NOT DETECTED Final   Parainfluenza Virus 4 NOT DETECTED NOT DETECTED Final   Respiratory Syncytial Virus NOT DETECTED NOT DETECTED Final   Bordetella pertussis NOT DETECTED NOT DETECTED Final   Bordetella Parapertussis NOT DETECTED NOT DETECTED Final   Chlamydophila pneumoniae NOT DETECTED NOT DETECTED Final   Mycoplasma pneumoniae NOT DETECTED NOT DETECTED Final    Comment: Performed at [facility], [place] [address], [place], [place] [address]  Blood Culture  (routine x 2)     Status: None   Collection Time: 11/12/21  4:23 PM   Specimen: Site Not Specified; Blood  Result Value Ref Range Status   Specimen Description SITE NOT SPECIFIED  Final   Special Requests   Final    BOTTLES DRAWN AEROBIC AND ANAEROBIC Blood Culture adequate volume   Culture   Final    NO GROWTH 5 DAYS Performed at [facility], [place] [address], [place], [place] [address]    Report Status 11/17/2021 FINAL  Final  Resp Panel by RT-PCR (Flu A&B, Covid) Nasopharyngeal Swab     Status: None   Collection Time: 11/12/21  4:25 PM   Specimen: Nasopharyngeal Swab; Nasopharyngeal(NP) swabs in vial transport medium  Result Value Ref Range Status   SARS Coronavirus 2 by RT PCR NEGATIVE NEGATIVE Final    Comment: (NOTE) SARS-CoV-2 target nucleic acids are NOT DETECTED.  The SARS-CoV-2 RNA is generally detectable in upper respiratory specimens during the acute phase of infection. The lowest concentration of SARS-CoV-2 viral copies this assay can detect is 138 copies/mL. A negative result does not preclude SARS-Cov-2 infection and should not be used as the sole basis for treatment or other patient management decisions. A negative result may occur with  improper specimen collection/handling, submission of specimen other than nasopharyngeal swab, presence of viral mutation(s) within the areas targeted by this assay, and inadequate number of viral copies(<138 copies/mL). A negative result must be combined with clinical observations, patient history, and epidemiological information. The expected result is Negative.  Fact Sheet for Patients:  EntrepreneurPulse.com.au  Fact Sheet for Healthcare Providers:  IncredibleEmployment.be  This test is no t yet approved or cleared by the [place] FDA and  has been authorized for detection and/or diagnosis of SARS-CoV-2 by FDA under an Emergency Use Authorization (EUA). This EUA will remain  in  effect (meaning this test can be used) for the duration of the COVID-19 declaration under Section 564(b)(1) of the Act, 21 U.S.C.section 360bbb-3(b)(1), unless the authorization is terminated  or revoked sooner.       Influenza A by PCR NEGATIVE NEGATIVE Final   Influenza B by PCR NEGATIVE NEGATIVE Final    Comment: (NOTE) The Xpert Xpress SARS-CoV-2/FLU/RSV plus assay is intended as an aid in the diagnosis of influenza from Nasopharyngeal swab specimens and should not be used as a sole basis for treatment. Nasal washings and aspirates are unacceptable for Xpert Xpress SARS-CoV-2/FLU/RSV testing.  Fact Sheet for Patients: EntrepreneurPulse.com.au  Fact Sheet for Healthcare Providers: IncredibleEmployment.be  This test is not yet approved or cleared by the [place] FDA and has been authorized for detection and/or diagnosis of SARS-CoV-2 by FDA under an Emergency Use Authorization (EUA). This  EUA will remain in effect (meaning this test can be used) for the duration of the COVID-19 declaration under Section 564(b)(1) of the Act, 21 U.S.C. section 360bbb-3(b)(1), unless the authorization is terminated or revoked.  Performed at [facility], [place] [address], [place], [place] [address]   Blood Culture (routine x 2)     Status: None   Collection Time: 11/12/21  4:44 PM   Specimen: Site Not Specified; Blood  Result Value Ref Range Status   Specimen Description SITE NOT SPECIFIED  Final   Special Requests   Final    BOTTLES DRAWN AEROBIC AND ANAEROBIC Blood Culture results may not be optimal due to an excessive volume of blood received in culture bottles   Culture   Final    NO GROWTH 5 DAYS Performed at [facility], [place] [address] [place], [place] [address]    Report Status 11/17/2021 FINAL  Final  MRSA Next Gen by PCR, Nasal     Status: Abnormal   Collection Time: 11/12/21  7:36 PM   Specimen: Nasal Mucosa; Nasal Swab   Result Value Ref Range Status   MRSA by PCR Next Gen DETECTED (A) NOT DETECTED Final    Comment: RESULT CALLED TO, READ BACK BY AND VERIFIED WITH: V [email or web address]  11/12/21 [place] (NOTE) The GeneXpert MRSA Assay (FDA approved for NASAL specimens only), is one component of a comprehensive MRSA colonization surveillance program. It is not intended to diagnose MRSA infection nor to guide or monitor treatment for MRSA infections. Test performance is not FDA approved in patients less than 54 years old. Performed at [facility], [place] [address] [place], [place] [address]   Culture, Respiratory w Gram Stain     Status: None   Collection Time: 11/13/21  2:09 AM   Specimen: Bronchoalveolar Lavage; Respiratory  Result Value Ref Range Status   Specimen Description BRONCHIAL ALVEOLAR LAVAGE  Final   Special Requests NONE  Final   Gram Stain   Final    MODERATE WBC PRESENT, PREDOMINANTLY PMN FEW GRAM POSITIVE COCCI IN PAIRS AND CHAINS Performed at [facility], [address], [place], [place] [address]    Culture FEW METHICILLIN RESISTANT STAPHYLOCOCCUS AUREUS  Final   Report Status 11/15/2021 FINAL  Final   Organism ID, Bacteria METHICILLIN RESISTANT STAPHYLOCOCCUS AUREUS  Final      Susceptibility   Methicillin resistant staphylococcus aureus - MIC*    CIPROFLOXACIN >=8 RESISTANT Resistant     ERYTHROMYCIN >=8 RESISTANT Resistant     GENTAMICIN <=0.5 SENSITIVE Sensitive     OXACILLIN >=4 RESISTANT Resistant     TETRACYCLINE <=1 SENSITIVE Sensitive     VANCOMYCIN 1 SENSITIVE Sensitive     TRIMETH/SULFA <=10 SENSITIVE Sensitive     CLINDAMYCIN <=0.25 SENSITIVE Sensitive     RIFAMPIN <=0.5 SENSITIVE Sensitive     Inducible Clindamycin NEGATIVE Sensitive     * FEW METHICILLIN RESISTANT STAPHYLOCOCCUS AUREUS  CSF culture w Stat Gram Stain     Status: None   Collection Time: 11/15/21  8:02 AM   Specimen: CSF; Cerebrospinal Fluid  Result Value Ref Range Status   Specimen  Description CSF  Final   Special Requests NONE  Final   Gram Stain   Final    WBC PRESENT,BOTH PMN AND MONONUCLEAR NO ORGANISMS SEEN CYTOSPIN SMEAR    Culture   Final    NO GROWTH Performed at [facility], [place] [address] [place],  [address]  Report Status 11/18/2021 FINAL  Final  Acid Fast Smear (AFB)     Status: None   Collection Time: 11/15/21  9:23 AM   Specimen: CSF; Cerebrospinal Fluid  Result Value Ref Range Status   AFB Specimen Processing Direct Inoculation  Final   Acid Fast Smear Negative  Final    Comment: (NOTE) Performed At: [facility] [place], [place] [id number] Rush Farmer MD SK:[id number]    Source (AFB) CSF  Final    Comment: Performed at [facility], [place] [address], [place], [place] [address]  Culture, fungus without smear     Status: None (Preliminary result)   Collection Time: 11/15/21  9:24 AM   Specimen: CSF; Other  Result Value Ref Range Status   Specimen Description CSF  Final   Special Requests Normal  Final   Culture   Final    NO GROWTH 4 DAYS Performed at [place] [address] [place], [place] [address]    Report Status PENDING  Incomplete    Impression/Plan:  1. Brain abscess - multiple areas and only one positive culture from the lungs.  Blood cultures have remained negative otherwise and unclear etiology of the brain findings.  There are > 500 WBCs in the CSF.  Seems most c/w infection but unclear if MRSA so will continue with broad coverage.    2.  Pulmonary opacities - more findings on CT scan yesterday with multifocal opacities/ground glass.  May be related to endocarditis but unclear and will need a TEE.  ?PFO  3.  Seizures - ongoing seizures and on keppra.  Will change cefepime and metronidazole to meropenem to take out any cefepime concerns with ongoing seizures.

## 2021-11-21 ENCOUNTER — Other Ambulatory Visit: Payer: Self-pay

## 2021-11-21 ENCOUNTER — Inpatient Hospital Stay (HOSPITAL_COMMUNITY): Payer: Medicaid Other

## 2021-11-21 DIAGNOSIS — R609 Edema, unspecified: Secondary | ICD-10-CM | POA: Diagnosis not present

## 2021-11-21 DIAGNOSIS — I469 Cardiac arrest, cause unspecified: Secondary | ICD-10-CM | POA: Diagnosis not present

## 2021-11-21 DIAGNOSIS — R569 Unspecified convulsions: Secondary | ICD-10-CM | POA: Diagnosis not present

## 2021-11-21 DIAGNOSIS — G060 Intracranial abscess and granuloma: Secondary | ICD-10-CM | POA: Diagnosis not present

## 2021-11-21 LAB — BASIC METABOLIC PANEL
Anion gap: 11 (ref 5–15)
BUN: 38 mg/dL — ABNORMAL HIGH (ref 6–20)
CO2: 32 mmol/L (ref 22–32)
Calcium: 9 mg/dL (ref 8.9–10.3)
Chloride: 100 mmol/L (ref 98–111)
Creatinine, Ser: 0.81 mg/dL (ref 0.44–1.00)
GFR, Estimated: >60 mL/min (ref >60)
Glucose, Bld: 227 mg/dL — ABNORMAL HIGH (ref 70–99)
Potassium: 3.9 mmol/L (ref 3.5–5.1)
Sodium: 143 mmol/L (ref 135–145)

## 2021-11-21 LAB — MAGNESIUM: Magnesium: 2.4 mg/dL (ref 1.7–2.4)

## 2021-11-21 LAB — PHOSPHORUS: Phosphorus: 2.6 mg/dL (ref 2.5–4.6)

## 2021-11-21 LAB — CBC
HCT: 31.2 % — ABNORMAL LOW (ref 36.0–46.0)
Hemoglobin: 9.3 g/dL — ABNORMAL LOW (ref 12.0–15.0)
MCH: 28.2 pg (ref 26.0–34.0)
MCHC: 29.8 g/dL — ABNORMAL LOW (ref 30.0–36.0)
MCV: 94.5 fL (ref 80.0–100.0)
Platelets: 205 10*3/uL (ref 150–400)
RBC: 3.3 MIL/uL — ABNORMAL LOW (ref 3.87–5.11)
RDW: 18.2 % — ABNORMAL HIGH (ref 11.5–15.5)
WBC: 10.9 10*3/uL — ABNORMAL HIGH (ref 4.0–10.5)
nRBC: 0.3 % — ABNORMAL HIGH (ref 0.0–0.2)

## 2021-11-21 LAB — QUANTIFERON-TB GOLD PLUS

## 2021-11-21 LAB — GLUCOSE, CAPILLARY
Glucose-Capillary: 215 mg/dL — ABNORMAL HIGH (ref 70–99)
Glucose-Capillary: 218 mg/dL — ABNORMAL HIGH (ref 70–99)
Glucose-Capillary: 221 mg/dL — ABNORMAL HIGH (ref 70–99)
Glucose-Capillary: 244 mg/dL — ABNORMAL HIGH (ref 70–99)
Glucose-Capillary: 260 mg/dL — ABNORMAL HIGH (ref 70–99)
Glucose-Capillary: 293 mg/dL — ABNORMAL HIGH (ref 70–99)

## 2021-11-21 LAB — TOXOPLASMA GONDII, PCR: Toxoplasma Gondii, PCR: NEGATIVE

## 2021-11-21 LAB — OLIGOCLONAL BANDS, CSF + SERM

## 2021-11-21 MED ORDER — METOPROLOL TARTRATE 25 MG/10 ML ORAL SUSPENSION
25.0000 mg | Freq: Two times a day (BID) | ORAL | Status: DC
  Administered 2021-11-21 – 2021-12-02 (×21): 25 mg
  Filled 2021-11-21 (×21): qty 10

## 2021-11-21 MED ORDER — INSULIN GLARGINE-YFGN 100 UNIT/ML ~~LOC~~ SOLN
20.0000 [IU] | Freq: Two times a day (BID) | SUBCUTANEOUS | Status: DC
  Administered 2021-11-21 – 2021-11-27 (×11): 20 [IU] via SUBCUTANEOUS
  Filled 2021-11-21 (×15): qty 0.2

## 2021-11-21 MED ORDER — ENOXAPARIN SODIUM 80 MG/0.8ML IJ SOSY
80.0000 mg | PREFILLED_SYRINGE | Freq: Two times a day (BID) | INTRAMUSCULAR | Status: DC
  Administered 2021-11-21 – 2021-11-24 (×6): 80 mg via SUBCUTANEOUS
  Filled 2021-11-21 (×7): qty 0.8

## 2021-11-21 MED ORDER — AMLODIPINE BESYLATE 10 MG PO TABS
10.0000 mg | ORAL_TABLET | Freq: Every day | ORAL | Status: DC
  Administered 2021-11-22 – 2021-12-02 (×10): 10 mg
  Filled 2021-11-21 (×10): qty 1

## 2021-11-21 MED ORDER — METOPROLOL TARTRATE 50 MG PO TABS
50.0000 mg | ORAL_TABLET | Freq: Two times a day (BID) | ORAL | Status: DC
  Administered 2021-11-21: 50 mg
  Filled 2021-11-21: qty 1

## 2021-11-21 MED ORDER — AMLODIPINE BESYLATE 10 MG PO TABS
10.0000 mg | ORAL_TABLET | Freq: Every day | ORAL | Status: DC
  Administered 2021-11-21: 10 mg via ORAL
  Filled 2021-11-21: qty 1

## 2021-11-21 NOTE — Plan of Care (Signed)
°  Problem: Nutrition: Goal: Adequate nutrition will be maintained Outcome: Progressing   Problem: Coping: Goal: Level of anxiety will decrease Outcome: Progressing   Problem: Pain Managment: Goal: General experience of comfort will improve Outcome: Progressing   Problem: Safety: Goal: Ability to remain free from injury will improve Outcome: Progressing   Problem: Respiratory: Goal: Ability to maintain a clear airway and adequate ventilation will improve Outcome: Progressing

## 2021-11-21 NOTE — Progress Notes (Signed)
NAME:  Kristen Murphy, MRN:  [id number], DOB:  November 06, 1965, LOS: 9 ADMISSION DATE:  11/12/2021, CONSULTATION DATE: 11/12/2021 REFERRING MD: Dr. Langston Masker, CHIEF COMPLAINT: PEA arrest  History of Present Illness:  56 year old woman with a history of tobacco use, cervical cancer ([facility]), post nephrectomy and colostomy, CAD with LAD stent 01/2019, diabetes, depression, hypertension, remote CVA without known deficits.  Also with a history of substance abuse.  Brought in by her son to the ED 2/19 with fever, altered mental status and slurred speech.  Apparently she had a flulike illness for a week, was found confused on 2/19.  Some suspicion by son that she may have been taking more prescription narcotics, also taking many NSAIDs per his report.  She was disoriented in the ED, febrile 103F, hyperglycemic. In the ED she apparently experienced acute hypoxemia, had some blood from her mouth, question emesis versus hemoptysis.  Devolved to PEA and required emergent ET intubation and CPR.  ROSC after 2 minutes.  OG tube placed with bright red blood obtained.  Chest x-ray with mild right hilar prominence, more notable on postintubation film.  No infiltrates or effusions She has received empiric antibiotics, is receiving 1 unit PRBC, is about to start Protonix infusion. Hemodynamically stabilized, mechanically ventilated.  Pertinent  Medical History   Past Medical History:  Diagnosis Date   Anemia    Anxiety    Arthritis    Asthma    Cervical cancer ([place])    Chronic kidney disease    Coronary artery disease    Depression    Diabetes mellitus without complication (HCC)    Dyspnea    Headache    Heart murmur    Hx of adenomatous polyp of colon    Hypertension    Rectovaginal fistula    Seizures ([place])    Stercoral ulcer of rectum 12/2020   Stroke [facility])     Significant Hospital Events: Including procedures, antibiotic start and stop dates in addition to other pertinent events   2/19 Intubated. Head  CT  > no acute CT findings, old left basal ganglia lacunar, old left frontal cortical and subcortical infarct 2/24 MR Brain - Numerous lesions with mild associated hemorrhage. Mild edema. Concerned for brain abscesses 2/22 LP consistent with bacterial meningitis. Respiratory Cx +MRSA 2/25 MR Liver with no lesions, hypoperfusion to the subcapsular right hepatic lobe, favor benign vascular phenomena, distended gallbladder, persistent region of hypoperfusion to the  2/28 No acute events overnight, LTM read pending  Interim History / Subjective:  Sedated on vent   Objective   Blood pressure (!) 126/44, pulse (!) 102, temperature 99.1 F (37.3 C), resp. rate (!) 28, height 5\' 2"  (1.575 m), weight 78.6 kg, SpO2 99 %.    Vent Mode: PRVC FiO2 (%):  [40 %-60 %] 50 % Set Rate:  [28 bmp] 28 bmp Vt Set:  [400 mL] 400 mL PEEP:  [10 cmH20] 10 cmH20 Plateau Pressure:  [20 cmH20-21 cmH20] 20 cmH20   Intake/Output Summary (Last 24 hours) at 11/21/2021 0938 Last data filed at 11/21/2021 0720 Gross per 24 hour  Intake 3271.5 ml  Output 2350 ml  Net 921.5 ml    Filed Weights   11/19/21 0500 11/20/21 0343 11/21/21 0500  Weight: 80.7 kg 74.1 kg 78.6 kg    Physical Exam: General: Acute on chronically ill appearing middle aged female lying in bed on mechanical ventilation, in NAD HEENT: ETT, MM pink/moist, PERRL,  Neuro: Sedated on vent  CV: s1s2 regular rate and  rhythm, no murmur, rubs, or gallops,  PULM:  Faint rhonchi bilaterally, tolerating vent, PEEP and FIO2 requirements improving  GI: soft, bowel sounds active in all 4 quadrants, non-tender, non-distended, tolerating TF Extremities: warm/dry, generalized edema  Skin: no rashes or lesions  Resolved Hospital Problem list   Hyponatremia  Assessment & Plan:   Multiple brain abscesses Bacterial meningitis Acute metabolic encephalopathy secondary to above -Repeat CT head 2/19 neg for acute findings.   -EEG and LTM 2/25 neg for seizures.   Consider also substances as son concerned about possible narcotic use.  UDS only positive for benzo.  -MRI brain 2/21 numerous lesions with mild associated hemorrhage and edema. Neurosurgery reviewed imaging and patient not a good candidate for lesion aspiration. -Repeat head CT 2/27 with no acute changes  P: Remains on Linezolid and Meropenem Neuro and ID following, appreciate assistance  LP culture NTD TEE pending  Quantiferon goal and Oligoclonal bands remain pending  CSF IgG elevated at 22.5 Continue droplet and contact precautions  Consider lightening sedation if LTM negative   Acute respiratory failure with hypoxemia secondary to necrotizing MRSA pneumonia -Also urine culture polymicrobial with 1k Enterobacter and 3K Pseudomonas  P: Continue ventilator support with lung protective strategies  Wean PEEP and FiO2 for sats greater than 90%. Head of bed elevated 30 degrees. Plateau pressures less than 30 cm H20.  Follow intermittent chest x-ray and ABG.   SAT/SBT as tolerated, mentation preclude extubation  Ensure adequate pulmonary hygiene  Follow cultures  VAP bundle in place  PAD protocol  Septic shock secondary  necrotizing MRSA pneumonia, bacterial meningitis  - intermittent levophed use at night however able to wean off during the day. Leukocytosis improving and clinically pneumonia seems to be improving  Acute cardiopulmonary arrest, PEA arrest Hypertension History of CAD P: Off pressors as of 2/27 ID following appreciate assistance  Resume home Norvasc  Keep Lisinopril and Metoprolol on hold, resume as able   Continuous telemetry  Goal for net negative volume status , diureses as able  Resume ASA  Acute blood loss/chronic anemia  - Hgb stable, no further bleeds History of NSAID abuse -EGD blood clots in gastric fundus/body, erosive gastropathy, no stigmata of bleeding but OG tube trauma in body P: Continue PPR  Trend CBC  ASA   Diabetes with  hyperglycemia P: Continue SSI , TF coverage, and long acting insulin  Consider uptitrating TF coverage if CBG remains elevated  CBG goal 140-180 Home Metformin, and Farxiga on hold   Hypokalemia Hypocalcemia - corrected to wnl in setting hypoalbuminemia P: Trend BMP  Replace as needed   Best Practice (right click and "Reselect all SmartList Selections" daily)   Diet/type: tubefeeds DVT prophylaxis: prophylactic heparin  GI prophylaxis: PPI Lines: Central line > PICC Foley:  N/A Code Status:  full code Last date of multidisciplinary goals of care discussion pending   Critical care time:    Performed by: Bryston Colocho D. Harris  Total critical care time: 38 minutes  Critical care time was exclusive of separately billable procedures and treating other patients.  Critical care was necessary to treat or prevent imminent or life-threatening deterioration.  Critical care was time spent personally by me on the following activities: development of treatment plan with patient and/or surrogate as well as nursing, discussions with consultants, evaluation of patient's response to treatment, examination of patient, obtaining history from patient or surrogate, ordering and performing treatments and interventions, ordering and review of laboratory studies, ordering and review of radiographic studies,  pulse oximetry and re-evaluation of patient's condition.  Abdullahi Vallone D. Kenton Kingfisher, NP-C [facility] Personal contact information can be found on Amion  11/21/2021, 9:59 AM

## 2021-11-21 NOTE — Progress Notes (Addendum)
Inpatient Diabetes Program Recommendations  AACE/ADA: New Consensus Statement on Inpatient Glycemic Control (2015)  Target Ranges:  Prepandial:   less than 140 mg/dL      Peak postprandial:   less than 180 mg/dL (1-2 hours)      Critically ill patients:  140 - 180 mg/dL   Lab Results  Component Value Date   GLUCAP 244 (H) 11/21/2021   HGBA1C 7.2 (H) 11/12/2021    Review of Glycemic Control  Latest Reference Range & Units 11/20/21 08:32 11/20/21 11:55 11/20/21 15:22 11/20/21 19:39 11/20/21 21:00 11/20/21 23:04 11/21/21 03:45 11/21/21 07:51  Glucose-Capillary 70 - 99 mg/dL 256 (H) Novolog 14 units 235 (H) Novolog 11 units Semglee 10 units 183 (H) Novolog 9 units  277 (H) Novolog 16 units Semglee 10 units 286 (H) Novolog 17 units 215 (H) Novolog 11 units 244 (H) Novolog 13 units   (H): Data is abnormally high  Diabetes history: DM2 Outpatient Diabetes medications:  Metformin (not taking) Januvia 50 mg QD Farxiga 10 mg QD Current orders for Inpatient glycemic control:  Semglee 10 units BID Novolog 0-15 units Q4H Novolog 8 units Q4H Vital 1.5 continuous   Inpatient Diabetes Program Recommendations:    Has received a total of 91 units of Novolog in 24hrs.  Might consider increasing Semglee to 20 units BID.  Will continue to follow while inpatient.  Thank you, Reche Dixon, MSN, RN Diabetes Coordinator Inpatient Diabetes Program [phone] (team pager from 8a-5p)

## 2021-11-21 NOTE — Progress Notes (Signed)
[facility] for Infectious Disease   Reason for visit: Follow up on brain abscess  Interval History: remains afebrile.   WBC 10.9. Day 10 total antibiotics Day 2 meropenem Day 8 linezolid  Physical Exam: Constitutional:  Vitals:   11/21/21 0700 11/21/21 0756  BP: (!) 126/44   Pulse: 62 (!) 102  Resp: (!) 28 (!) 28  Temp: 99 F (37.2 C) 99.1 F (37.3 C)  SpO2: 90% 99%  Patient does not respond HENT: +ET Respiratory: respiratory effort on vent  Review of Systems: Unable to be assessed due to patient factors  Lab Results  Component Value Date   WBC 10.9 (H) 11/21/2021   HGB 9.3 (L) 11/21/2021   HCT 31.2 (L) 11/21/2021   MCV 94.5 11/21/2021   PLT 205 11/21/2021    Lab Results  Component Value Date   CREATININE 0.81 11/21/2021   BUN 38 (H) 11/21/2021   NA 143 11/21/2021   K 3.9 11/21/2021   CL 100 11/21/2021   CO2 32 11/21/2021    Lab Results  Component Value Date   ALT 25 11/16/2021   AST 19 11/16/2021   ALKPHOS 202 (H) 11/16/2021     Microbiology: Recent Results (from the past 240 hour(s))  Urine Culture     Status: Abnormal   Collection Time: 11/12/21  3:36 PM   Specimen: In/Out Cath Urine  Result Value Ref Range Status   Specimen Description IN/OUT CATH URINE  Final   Special Requests   Final    NONE Performed at [facility], [address] [place], [place] [address]    Culture (A)  Final    1,000 COLONIES/mL ENTEROBACTER CLOACAE 3,000 COLONIES/mL PSEUDOMONAS AERUGINOSA    Report Status 11/15/2021 FINAL  Final   Organism ID, Bacteria ENTEROBACTER CLOACAE (A)  Final   Organism ID, Bacteria PSEUDOMONAS AERUGINOSA (A)  Final      Susceptibility   Enterobacter cloacae - MIC*    CEFAZOLIN >=64 RESISTANT Resistant     CEFEPIME <=0.12 SENSITIVE Sensitive     CIPROFLOXACIN <=0.25 SENSITIVE Sensitive     GENTAMICIN <=1 SENSITIVE Sensitive     IMIPENEM <=0.25 SENSITIVE Sensitive     NITROFURANTOIN 32 SENSITIVE Sensitive      TRIMETH/SULFA <=20 SENSITIVE Sensitive     PIP/TAZO <=4 SENSITIVE Sensitive     * 1,000 COLONIES/mL ENTEROBACTER CLOACAE   Pseudomonas aeruginosa - MIC*    CEFTAZIDIME 4 SENSITIVE Sensitive     CIPROFLOXACIN <=0.25 SENSITIVE Sensitive     GENTAMICIN <=1 SENSITIVE Sensitive     IMIPENEM 2 SENSITIVE Sensitive     PIP/TAZO 8 SENSITIVE Sensitive     CEFEPIME 2 SENSITIVE Sensitive     * 3,000 COLONIES/mL PSEUDOMONAS AERUGINOSA  Respiratory (~20 pathogens) panel by PCR     Status: None   Collection Time: 11/12/21  3:37 PM   Specimen: Nasopharyngeal Swab; Respiratory  Result Value Ref Range Status   Adenovirus NOT DETECTED NOT DETECTED Final   Coronavirus 229E NOT DETECTED NOT DETECTED Final    Comment: (NOTE) The Coronavirus on the Respiratory Panel, DOES NOT test for the novel  Coronavirus (2019 nCoV)    Coronavirus HKU1 NOT DETECTED NOT DETECTED Final   Coronavirus NL63 NOT DETECTED NOT DETECTED Final   Coronavirus OC43 NOT DETECTED NOT DETECTED Final   Metapneumovirus NOT DETECTED NOT DETECTED Final   Rhinovirus / Enterovirus NOT DETECTED NOT DETECTED Final   Influenza A NOT DETECTED NOT DETECTED Final   Influenza B  NOT DETECTED NOT DETECTED Final   Parainfluenza Virus 1 NOT DETECTED NOT DETECTED Final   Parainfluenza Virus 2 NOT DETECTED NOT DETECTED Final   Parainfluenza Virus 3 NOT DETECTED NOT DETECTED Final   Parainfluenza Virus 4 NOT DETECTED NOT DETECTED Final   Respiratory Syncytial Virus NOT DETECTED NOT DETECTED Final   Bordetella pertussis NOT DETECTED NOT DETECTED Final   Bordetella Parapertussis NOT DETECTED NOT DETECTED Final   Chlamydophila pneumoniae NOT DETECTED NOT DETECTED Final   Mycoplasma pneumoniae NOT DETECTED NOT DETECTED Final    Comment: Performed at [facility], [place] [address] [place], [place] [address]  Blood Culture (routine x 2)     Status: None   Collection Time: 11/12/21  4:23 PM   Specimen: Site Not Specified; Blood  Result Value Ref  Range Status   Specimen Description SITE NOT SPECIFIED  Final   Special Requests   Final    BOTTLES DRAWN AEROBIC AND ANAEROBIC Blood Culture adequate volume   Culture   Final    NO GROWTH 5 DAYS Performed at [facility], [place] [address], [place],  [address]    Report Status 11/17/2021 FINAL  Final  Resp Panel by RT-PCR (Flu A&B, Covid) Nasopharyngeal Swab     Status: None   Collection Time: 11/12/21  4:25 PM   Specimen: Nasopharyngeal Swab; Nasopharyngeal(NP) swabs in vial transport medium  Result Value Ref Range Status   SARS Coronavirus 2 by RT PCR NEGATIVE NEGATIVE Final    Comment: (NOTE) SARS-CoV-2 target nucleic acids are NOT DETECTED.  The SARS-CoV-2 RNA is generally detectable in upper respiratory specimens during the acute phase of infection. The lowest concentration of SARS-CoV-2 viral copies this assay can detect is 138 copies/mL. A negative result does not preclude SARS-Cov-2 infection and should not be used as the sole basis for treatment or other patient management decisions. A negative result may occur with  improper specimen collection/handling, submission of specimen other than nasopharyngeal swab, presence of viral mutation(s) within the areas targeted by this assay, and inadequate number of viral copies(<138 copies/mL). A negative result must be combined with clinical observations, patient history, and epidemiological information. The expected result is Negative.  Fact Sheet for Patients:  EntrepreneurPulse.com.au  Fact Sheet for Healthcare Providers:  IncredibleEmployment.be  This test is no t yet approved or cleared by the [place] FDA and  has been authorized for detection and/or diagnosis of SARS-CoV-2 by FDA under an Emergency Use Authorization (EUA). This EUA will remain  in effect (meaning this test can be used) for the duration of the COVID-19 declaration under Section 564(b)(1) of the Act,  21 U.S.C.section 360bbb-3(b)(1), unless the authorization is terminated  or revoked sooner.       Influenza A by PCR NEGATIVE NEGATIVE Final   Influenza B by PCR NEGATIVE NEGATIVE Final    Comment: (NOTE) The Xpert Xpress SARS-CoV-2/FLU/RSV plus assay is intended as an aid in the diagnosis of influenza from Nasopharyngeal swab specimens and should not be used as a sole basis for treatment. Nasal washings and aspirates are unacceptable for Xpert Xpress SARS-CoV-2/FLU/RSV testing.  Fact Sheet for Patients: EntrepreneurPulse.com.au  Fact Sheet for Healthcare Providers: IncredibleEmployment.be  This test is not yet approved or cleared by the [place] FDA and has been authorized for detection and/or diagnosis of SARS-CoV-2 by FDA under an Emergency Use Authorization (EUA). This EUA will remain in effect (meaning this test can be used) for the duration of the COVID-19 declaration under  Section 564(b)(1) of the Act, 21 U.S.C. section 360bbb-3(b)(1), unless the authorization is terminated or revoked.  Performed at [facility], [place] [address], [place], [place] [address]   Blood Culture (routine x 2)     Status: None   Collection Time: 11/12/21  4:44 PM   Specimen: Site Not Specified; Blood  Result Value Ref Range Status   Specimen Description SITE NOT SPECIFIED  Final   Special Requests   Final    BOTTLES DRAWN AEROBIC AND ANAEROBIC Blood Culture results may not be optimal due to an excessive volume of blood received in culture bottles   Culture   Final    NO GROWTH 5 DAYS Performed at [facility], [place] [address] [place], [place] [address]    Report Status 11/17/2021 FINAL  Final  MRSA Next Gen by PCR, Nasal     Status: Abnormal   Collection Time: 11/12/21  7:36 PM   Specimen: Nasal Mucosa; Nasal Swab  Result Value Ref Range Status   MRSA by PCR Next Gen DETECTED (A) NOT DETECTED Final    Comment: RESULT CALLED TO, READ  BACK BY AND VERIFIED WITH: V [email or web address]  11/12/21 [place] (NOTE) The GeneXpert MRSA Assay (FDA approved for NASAL specimens only), is one component of a comprehensive MRSA colonization surveillance program. It is not intended to diagnose MRSA infection nor to guide or monitor treatment for MRSA infections. Test performance is not FDA approved in patients less than 44 years old. Performed at [facility], [place] [address], [place], [place] [address]   Culture, Respiratory w Gram Stain     Status: None   Collection Time: 11/13/21  2:09 AM   Specimen: Bronchoalveolar Lavage; Respiratory  Result Value Ref Range Status   Specimen Description BRONCHIAL ALVEOLAR LAVAGE  Final   Special Requests NONE  Final   Gram Stain   Final    MODERATE WBC PRESENT, PREDOMINANTLY PMN FEW GRAM POSITIVE COCCI IN PAIRS AND CHAINS Performed at [facility], [address] [place], [place] [address]    Culture FEW METHICILLIN RESISTANT STAPHYLOCOCCUS AUREUS  Final   Report Status 11/15/2021 FINAL  Final   Organism ID, Bacteria METHICILLIN RESISTANT STAPHYLOCOCCUS AUREUS  Final      Susceptibility   Methicillin resistant staphylococcus aureus - MIC*    CIPROFLOXACIN >=8 RESISTANT Resistant     ERYTHROMYCIN >=8 RESISTANT Resistant     GENTAMICIN <=0.5 SENSITIVE Sensitive     OXACILLIN >=4 RESISTANT Resistant     TETRACYCLINE <=1 SENSITIVE Sensitive     VANCOMYCIN 1 SENSITIVE Sensitive     TRIMETH/SULFA <=10 SENSITIVE Sensitive     CLINDAMYCIN <=0.25 SENSITIVE Sensitive     RIFAMPIN <=0.5 SENSITIVE Sensitive     Inducible Clindamycin NEGATIVE Sensitive     * FEW METHICILLIN RESISTANT STAPHYLOCOCCUS AUREUS  CSF culture w Stat Gram Stain     Status: None   Collection Time: 11/15/21  8:02 AM   Specimen: CSF; Cerebrospinal Fluid  Result Value Ref Range Status   Specimen Description CSF  Final   Special Requests NONE  Final   Gram Stain   Final    WBC PRESENT,BOTH PMN AND MONONUCLEAR NO ORGANISMS  SEEN CYTOSPIN SMEAR    Culture   Final    NO GROWTH Performed at [facility], [place] [address], [place], [place] [address]    Report Status 11/18/2021 FINAL  Final  Acid Fast Smear (AFB)     Status: None  Collection Time: 11/15/21  9:23 AM   Specimen: CSF; Cerebrospinal Fluid  Result Value Ref Range Status   AFB Specimen Processing Direct Inoculation  Final   Acid Fast Smear Negative  Final    Comment: (NOTE) Performed At: [facility] [place], [place] [id number] Rush Farmer MD [id number]    Source (AFB) CSF  Final    Comment: Performed at [facility], [place] [address], [place], [place] [address]  Culture, fungus without smear     Status: None (Preliminary result)   Collection Time: 11/15/21  9:24 AM   Specimen: CSF; Other  Result Value Ref Range Status   Specimen Description CSF  Final   Special Requests Normal  Final   Culture   Final    NO FUNGUS ISOLATED AFTER 5 DAYS Performed at [facility], [address] [place], [place] [address]    Report Status PENDING  Incomplete    Impression/Plan:  1. Brain abscesses - multiple and no positive cultures findings to date.  On broad coverage with linezolid and meropenem.  2.  Pulmonary opacities - ? Septic embolli and will need a TEE at some point.  Positive MRSA in respiratory culture and this all could be a disseminated MRSA infection but no other positive cultures to confirm.    3.  Seizures - decreased seizure activity.  Have removed cefepime in case it is contributing.    4.  Infection prevention - on contact isolation for MRSA.  She is on droplet isolation as well for unclear reasons.  Respiratory viral panel negative.  Not meningococcal meningitis.    Will continue to follow

## 2021-11-21 NOTE — Progress Notes (Signed)
ETT found 24@ lip. ETT was advanced back to 30 as previously charted with assistance from RN . ETT holder was also changed.

## 2021-11-21 NOTE — Progress Notes (Signed)
Subjective: On fentanyl 265mg/hr sedated, has cough, gag, both pupils equal and reactive to light.   Objective: Current vital signs: BP (!) 126/44    Pulse (!) 102    Temp 99.1 F (37.3 C)    Resp (!) 28    Ht _0  (1.575 m)    Wt 78.6 kg    SpO2 99%    BMI 31.69 kg/m  Vital signs in last 24 hours: Temp:  [98.1 F (36.7 C)-99.7 F (37.6 C)] 99.1 F (37.3 C) (02/28 0756) Pulse Rate:  [57-123] 102 (02/28 0756) Resp:  [14-28] 28 (02/28 0756) BP: (117-190)/(38-94) 126/44 (02/28 0700) SpO2:  [85 %-99 %] 99 % (02/28 0756) FiO2 (%):  [40 %-60 %] 50 % (02/28 0756) Weight:  [78.6 kg] 78.6 kg (02/28 0500)  Intake/Output from previous day: 02/27 0701 - 02/28 0700 In: 2807.7 [I.V.:937.3; NG/GT:1034.1; IV Piggyback:836.3] Out: 2600 [Urine:2250; Stool:350] Intake/Output this shift: Total I/O In: 1005.8 [I.V.:290.8; IV Piggyback:715] Out: -  Nutritional status:  Diet Order             Diet NPO time specified  Diet effective midnight                  HEENT: [place]/AT. Neck supple. Lungs: Intubated  Neurologic Exam: Performed while on fentanyl  Ment: Eyes are closed initially, but will remain partially open after lids are passively elevated. Obtunded/sedated and non-responsive to noxious stimuli. Does not gaze towards or away from visual stimuli. Does not respond to voice or other auditory stimuli. Patient does not follow commands. No attempts to communicate.  CN: Pupils are 2 mm and reactive to light. This is improved from yesterday. Conjugate slow roving EOM to right, left and upwards.Positive doll eyes. No blink to threat. Blinks to eyelid stimulation bilaterally. Face flaccidly symmetric.   Motor/Sensory: All 4 extremities are flaccid with only slight finger ?purposeful movement to noxious stimuli (sedated) Other: No jerking, twitching or myoclonus seen.    Lab Results: Results for orders placed or performed during the hospital encounter of 11/12/21 (from the past 48 hour(s))   Glucose, capillary     Status: Abnormal   Collection Time: 11/19/21 12:02 PM  Result Value Ref Range   Glucose-Capillary 364 (H) 70 - 99 mg/dL    Comment: Glucose reference range applies only to samples taken after fasting for at least 8 hours.  Glucose, capillary     Status: Abnormal   Collection Time: 11/19/21  5:08 PM  Result Value Ref Range   Glucose-Capillary 356 (H) 70 - 99 mg/dL    Comment: Glucose reference range applies only to samples taken after fasting for at least 8 hours.  Glucose, capillary     Status: Abnormal   Collection Time: 11/19/21  8:08 PM  Result Value Ref Range   Glucose-Capillary 320 (H) 70 - 99 mg/dL    Comment: Glucose reference range applies only to samples taken after fasting for at least 8 hours.  Basic metabolic panel     Status: Abnormal   Collection Time: 11/19/21  8:10 PM  Result Value Ref Range   Sodium 138 135 - 145 mmol/L   Potassium 3.8 3.5 - 5.1 mmol/L   Chloride 100 98 - 111 mmol/L   CO2 30 22 - 32 mmol/L   Glucose, Bld 323 (H) 70 - 99 mg/dL    Comment: Glucose reference range applies only to samples taken after fasting for at least 8 hours.   BUN 34 (H) 6 - 20  mg/dL   Creatinine, Ser 0.91 0.44 - 1.00 mg/dL   Calcium 8.9 8.9 - 10.3 mg/dL   GFR, Estimated >60 >60 mL/min    Comment: (NOTE) Calculated using the CKD-EPI Creatinine Equation (2021)    Anion gap 8 5 - 15    Comment: Performed at [facility] [address], [place], [place] [address]  Glucose, capillary     Status: Abnormal   Collection Time: 11/19/21 10:42 PM  Result Value Ref Range   Glucose-Capillary 336 (H) 70 - 99 mg/dL    Comment: Glucose reference range applies only to samples taken after fasting for at least 8 hours.  Glucose, capillary     Status: Abnormal   Collection Time: 11/20/21  3:29 AM  Result Value Ref Range   Glucose-Capillary 241 (H) 70 - 99 mg/dL    Comment: Glucose reference range applies only to samples taken after fasting for at least 8 hours.   CBC     Status: Abnormal   Collection Time: 11/20/21  3:53 AM  Result Value Ref Range   WBC 12.1 (H) 4.0 - 10.5 K/uL   RBC 3.46 (L) 3.87 - 5.11 MIL/uL   Hemoglobin 9.7 (L) 12.0 - 15.0 g/dL   HCT 32.4 (L) 36.0 - 46.0 %   MCV 93.6 80.0 - 100.0 fL   MCH 28.0 26.0 - 34.0 pg   MCHC 29.9 (L) 30.0 - 36.0 g/dL   RDW 17.6 (H) 11.5 - 15.5 %   Platelets 204 150 - 400 K/uL   nRBC 0.7 (H) 0.0 - 0.2 %    Comment: Performed at [facility], [place] [address] [place], [place] [address]  Basic metabolic panel     Status: Abnormal   Collection Time: 11/20/21  3:53 AM  Result Value Ref Range   Sodium 139 135 - 145 mmol/L   Potassium 3.6 3.5 - 5.1 mmol/L   Chloride 100 98 - 111 mmol/L   CO2 31 22 - 32 mmol/L   Glucose, Bld 280 (H) 70 - 99 mg/dL    Comment: Glucose reference range applies only to samples taken after fasting for at least 8 hours.   BUN 36 (H) 6 - 20 mg/dL   Creatinine, Ser 0.88 0.44 - 1.00 mg/dL   Calcium 8.8 (L) 8.9 - 10.3 mg/dL   GFR, Estimated >60 >60 mL/min    Comment: (NOTE) Calculated using the CKD-EPI Creatinine Equation (2021)    Anion gap 8 5 - 15    Comment: Performed at [facility] [address] [place], [place] [address]  I-STAT 7, (LYTES, BLD GAS, ICA, H+H)     Status: Abnormal   Collection Time: 11/20/21  4:16 AM  Result Value Ref Range   pH, Arterial 7.385 7.35 - 7.45   pCO2 arterial 55.9 (H) 32 - 48 mmHg   pO2, Arterial 80 (L) 83 - 108 mmHg   Bicarbonate 33.4 (H) 20.0 - 28.0 mmol/L   TCO2 35 (H) 22 - 32 mmol/L   O2 Saturation 95 %   Acid-Base Excess 7.0 (H) 0.0 - 2.0 mmol/L   Sodium 138 135 - 145 mmol/L   Potassium 3.5 3.5 - 5.1 mmol/L   Calcium, Ion 1.27 1.15 - 1.40 mmol/L   HCT 31.0 (L) 36.0 - 46.0 %   Hemoglobin 10.5 (L) 12.0 - 15.0 g/dL   Patient temperature 37.2 C    Collection site RADIAL, ALLEN'S TEST ACCEPTABLE    Drawn by RT  Sample type ARTERIAL   Glucose, capillary     Status: Abnormal   Collection Time: 11/20/21  8:32 AM   Result Value Ref Range   Glucose-Capillary 256 (H) 70 - 99 mg/dL    Comment: Glucose reference range applies only to samples taken after fasting for at least 8 hours.  Glucose, capillary     Status: Abnormal   Collection Time: 11/20/21 11:55 AM  Result Value Ref Range   Glucose-Capillary 235 (H) 70 - 99 mg/dL    Comment: Glucose reference range applies only to samples taken after fasting for at least 8 hours.  Glucose, capillary     Status: Abnormal   Collection Time: 11/20/21  3:22 PM  Result Value Ref Range   Glucose-Capillary 183 (H) 70 - 99 mg/dL    Comment: Glucose reference range applies only to samples taken after fasting for at least 8 hours.  Glucose, capillary     Status: Abnormal   Collection Time: 11/20/21  7:39 PM  Result Value Ref Range   Glucose-Capillary 277 (H) 70 - 99 mg/dL    Comment: Glucose reference range applies only to samples taken after fasting for at least 8 hours.  Glucose, capillary     Status: Abnormal   Collection Time: 11/20/21 11:04 PM  Result Value Ref Range   Glucose-Capillary 286 (H) 70 - 99 mg/dL    Comment: Glucose reference range applies only to samples taken after fasting for at least 8 hours.  Glucose, capillary     Status: Abnormal   Collection Time: 11/21/21  3:45 AM  Result Value Ref Range   Glucose-Capillary 215 (H) 70 - 99 mg/dL    Comment: Glucose reference range applies only to samples taken after fasting for at least 8 hours.  Basic metabolic panel     Status: Abnormal   Collection Time: 11/21/21  3:46 AM  Result Value Ref Range   Sodium 143 135 - 145 mmol/L   Potassium 3.9 3.5 - 5.1 mmol/L   Chloride 100 98 - 111 mmol/L   CO2 32 22 - 32 mmol/L   Glucose, Bld 227 (H) 70 - 99 mg/dL    Comment: Glucose reference range applies only to samples taken after fasting for at least 8 hours.   BUN 38 (H) 6 - 20 mg/dL   Creatinine, Ser 0.81 0.44 - 1.00 mg/dL   Calcium 9.0 8.9 - 10.3 mg/dL   GFR, Estimated >60 >60 mL/min    Comment:  (NOTE) Calculated using the CKD-EPI Creatinine Equation (2021)    Anion gap 11 5 - 15    Comment: Performed at [facility] [address] [place], [place] [address]  Magnesium     Status: None   Collection Time: 11/21/21  3:46 AM  Result Value Ref Range   Magnesium 2.4 1.7 - 2.4 mg/dL    Comment: Performed at [facility] [address], [place], [place] [address]  Phosphorus     Status: None   Collection Time: 11/21/21  3:46 AM  Result Value Ref Range   Phosphorus 2.6 2.5 - 4.6 mg/dL    Comment: Performed at [facility] [address] [place] [address]  CBC     Status: Abnormal   Collection Time: 11/21/21  3:46 AM  Result Value Ref Range   WBC 10.9 (H) 4.0 - 10.5 K/uL   RBC 3.30 (L) 3.87 - 5.11 MIL/uL   Hemoglobin 9.3 (L) 12.0 - 15.0 g/dL  HCT 31.2 (L) 36.0 - 46.0 %   MCV 94.5 80.0 - 100.0 fL   MCH 28.2 26.0 - 34.0 pg   MCHC 29.8 (L) 30.0 - 36.0 g/dL   RDW 18.2 (H) 11.5 - 15.5 %   Platelets 205 150 - 400 K/uL   nRBC 0.3 (H) 0.0 - 0.2 %    Comment: Performed at [facility] [address], [place], [place] [address]  Glucose, capillary     Status: Abnormal   Collection Time: 11/21/21  7:51 AM  Result Value Ref Range   Glucose-Capillary 244 (H) 70 - 99 mg/dL    Comment: Glucose reference range applies only to samples taken after fasting for at least 8 hours.    Recent Results (from the past 240 hour(s))  Urine Culture     Status: Abnormal   Collection Time: 11/12/21  3:36 PM   Specimen: In/Out Cath Urine  Result Value Ref Range Status   Specimen Description IN/OUT CATH URINE  Final   Special Requests   Final    NONE Performed at [facility], [address] [place], [place] [address]    Culture (A)  Final    1,000 COLONIES/mL ENTEROBACTER CLOACAE 3,000 COLONIES/mL PSEUDOMONAS AERUGINOSA    Report Status 11/15/2021 FINAL  Final   Organism ID, Bacteria ENTEROBACTER CLOACAE (A)  Final   Organism ID, Bacteria PSEUDOMONAS  AERUGINOSA (A)  Final      Susceptibility   Enterobacter cloacae - MIC*    CEFAZOLIN >=64 RESISTANT Resistant     CEFEPIME <=0.12 SENSITIVE Sensitive     CIPROFLOXACIN <=0.25 SENSITIVE Sensitive     GENTAMICIN <=1 SENSITIVE Sensitive     IMIPENEM <=0.25 SENSITIVE Sensitive     NITROFURANTOIN 32 SENSITIVE Sensitive     TRIMETH/SULFA <=20 SENSITIVE Sensitive     PIP/TAZO <=4 SENSITIVE Sensitive     * 1,000 COLONIES/mL ENTEROBACTER CLOACAE   Pseudomonas aeruginosa - MIC*    CEFTAZIDIME 4 SENSITIVE Sensitive     CIPROFLOXACIN <=0.25 SENSITIVE Sensitive     GENTAMICIN <=1 SENSITIVE Sensitive     IMIPENEM 2 SENSITIVE Sensitive     PIP/TAZO 8 SENSITIVE Sensitive     CEFEPIME 2 SENSITIVE Sensitive     * 3,000 COLONIES/mL PSEUDOMONAS AERUGINOSA  Respiratory (~20 pathogens) panel by PCR     Status: None   Collection Time: 11/12/21  3:37 PM   Specimen: Nasopharyngeal Swab; Respiratory  Result Value Ref Range Status   Adenovirus NOT DETECTED NOT DETECTED Final   Coronavirus 229E NOT DETECTED NOT DETECTED Final    Comment: (NOTE) The Coronavirus on the Respiratory Panel, DOES NOT test for the novel  Coronavirus (2019 nCoV)    Coronavirus HKU1 NOT DETECTED NOT DETECTED Final   Coronavirus NL63 NOT DETECTED NOT DETECTED Final   Coronavirus OC43 NOT DETECTED NOT DETECTED Final   Metapneumovirus NOT DETECTED NOT DETECTED Final   Rhinovirus / Enterovirus NOT DETECTED NOT DETECTED Final   Influenza A NOT DETECTED NOT DETECTED Final   Influenza B NOT DETECTED NOT DETECTED Final   Parainfluenza Virus 1 NOT DETECTED NOT DETECTED Final   Parainfluenza Virus 2 NOT DETECTED NOT DETECTED Final   Parainfluenza Virus 3 NOT DETECTED NOT DETECTED Final   Parainfluenza Virus 4 NOT DETECTED NOT DETECTED Final   Respiratory Syncytial Virus NOT DETECTED NOT DETECTED Final   Bordetella pertussis NOT DETECTED NOT DETECTED Final   Bordetella Parapertussis NOT DETECTED NOT DETECTED Final   Chlamydophila  pneumoniae NOT  DETECTED NOT DETECTED Final   Mycoplasma pneumoniae NOT DETECTED NOT DETECTED Final    Comment: Performed at [facility], [place] [address] [place], [place] [address]  Blood Culture (routine x 2)     Status: None   Collection Time: 11/12/21  4:23 PM   Specimen: Site Not Specified; Blood  Result Value Ref Range Status   Specimen Description SITE NOT SPECIFIED  Final   Special Requests   Final    BOTTLES DRAWN AEROBIC AND ANAEROBIC Blood Culture adequate volume   Culture   Final    NO GROWTH 5 DAYS Performed at [facility], [place] [address], [place], [place] [address]    Report Status 11/17/2021 FINAL  Final  Resp Panel by RT-PCR (Flu A&B, Covid) Nasopharyngeal Swab     Status: None   Collection Time: 11/12/21  4:25 PM   Specimen: Nasopharyngeal Swab; Nasopharyngeal(NP) swabs in vial transport medium  Result Value Ref Range Status   SARS Coronavirus 2 by RT PCR NEGATIVE NEGATIVE Final    Comment: (NOTE) SARS-CoV-2 target nucleic acids are NOT DETECTED.  The SARS-CoV-2 RNA is generally detectable in upper respiratory specimens during the acute phase of infection. The lowest concentration of SARS-CoV-2 viral copies this assay can detect is 138 copies/mL. A negative result does not preclude SARS-Cov-2 infection and should not be used as the sole basis for treatment or other patient management decisions. A negative result may occur with  improper specimen collection/handling, submission of specimen other than nasopharyngeal swab, presence of viral mutation(s) within the areas targeted by this assay, and inadequate number of viral copies(<138 copies/mL). A negative result must be combined with clinical observations, patient history, and epidemiological information. The expected result is Negative.  Fact Sheet for Patients:  EntrepreneurPulse.com.au  Fact Sheet for Healthcare Providers:  IncredibleEmployment.be  This test  is no t yet approved or cleared by the [place] FDA and  has been authorized for detection and/or diagnosis of SARS-CoV-2 by FDA under an Emergency Use Authorization (EUA). This EUA will remain  in effect (meaning this test can be used) for the duration of the COVID-19 declaration under Section 564(b)(1) of the Act, 21 U.S.C.section 360bbb-3(b)(1), unless the authorization is terminated  or revoked sooner.       Influenza A by PCR NEGATIVE NEGATIVE Final   Influenza B by PCR NEGATIVE NEGATIVE Final    Comment: (NOTE) The Xpert Xpress SARS-CoV-2/FLU/RSV plus assay is intended as an aid in the diagnosis of influenza from Nasopharyngeal swab specimens and should not be used as a sole basis for treatment. Nasal washings and aspirates are unacceptable for Xpert Xpress SARS-CoV-2/FLU/RSV testing.  Fact Sheet for Patients: EntrepreneurPulse.com.au  Fact Sheet for Healthcare Providers: IncredibleEmployment.be  This test is not yet approved or cleared by the [place] FDA and has been authorized for detection and/or diagnosis of SARS-CoV-2 by FDA under an Emergency Use Authorization (EUA). This EUA will remain in effect (meaning this test can be used) for the duration of the COVID-19 declaration under Section 564(b)(1) of the Act, 21 U.S.C. section 360bbb-3(b)(1), unless the authorization is terminated or revoked.  Performed at [facility], [place] [address], [place], [place] [address]   Blood Culture (routine x 2)     Status: None   Collection Time: 11/12/21  4:44 PM   Specimen: Site Not Specified; Blood  Result Value Ref Range Status   Specimen Description SITE NOT SPECIFIED  Final   Special Requests  Final    BOTTLES DRAWN AEROBIC AND ANAEROBIC Blood Culture results may not be optimal due to an excessive volume of blood received in culture bottles   Culture   Final    NO GROWTH 5 DAYS Performed at [facility]  [address], [place], [place] [address]    Report Status 11/17/2021 FINAL  Final  MRSA Next Gen by PCR, Nasal     Status: Abnormal   Collection Time: 11/12/21  7:36 PM   Specimen: Nasal Mucosa; Nasal Swab  Result Value Ref Range Status   MRSA by PCR Next Gen DETECTED (A) NOT DETECTED Final    Comment: RESULT CALLED TO, READ BACK BY AND VERIFIED WITH: V [email or web address]  11/12/21 [place] (NOTE) The GeneXpert MRSA Assay (FDA approved for NASAL specimens only), is one component of a comprehensive MRSA colonization surveillance program. It is not intended to diagnose MRSA infection nor to guide or monitor treatment for MRSA infections. Test performance is not FDA approved in patients less than 57 years old. Performed at [facility], [place] [address], [place], [place] [address]   Culture, Respiratory w Gram Stain     Status: None   Collection Time: 11/13/21  2:09 AM   Specimen: Bronchoalveolar Lavage; Respiratory  Result Value Ref Range Status   Specimen Description BRONCHIAL ALVEOLAR LAVAGE  Final   Special Requests NONE  Final   Gram Stain   Final    MODERATE WBC PRESENT, PREDOMINANTLY PMN FEW GRAM POSITIVE COCCI IN PAIRS AND CHAINS Performed at [facility], [address] [place], [place] [address]    Culture FEW METHICILLIN RESISTANT STAPHYLOCOCCUS AUREUS  Final   Report Status 11/15/2021 FINAL  Final   Organism ID, Bacteria METHICILLIN RESISTANT STAPHYLOCOCCUS AUREUS  Final      Susceptibility   Methicillin resistant staphylococcus aureus - MIC*    CIPROFLOXACIN >=8 RESISTANT Resistant     ERYTHROMYCIN >=8 RESISTANT Resistant     GENTAMICIN <=0.5 SENSITIVE Sensitive     OXACILLIN >=4 RESISTANT Resistant     TETRACYCLINE <=1 SENSITIVE Sensitive     VANCOMYCIN 1 SENSITIVE Sensitive     TRIMETH/SULFA <=10 SENSITIVE Sensitive     CLINDAMYCIN <=0.25 SENSITIVE Sensitive     RIFAMPIN <=0.5 SENSITIVE Sensitive     Inducible Clindamycin NEGATIVE Sensitive     * FEW METHICILLIN  RESISTANT STAPHYLOCOCCUS AUREUS  CSF culture w Stat Gram Stain     Status: None   Collection Time: 11/15/21  8:02 AM   Specimen: CSF; Cerebrospinal Fluid  Result Value Ref Range Status   Specimen Description CSF  Final   Special Requests NONE  Final   Gram Stain   Final    WBC PRESENT,BOTH PMN AND MONONUCLEAR NO ORGANISMS SEEN CYTOSPIN SMEAR    Culture   Final    NO GROWTH Performed at [facility], [place] [address] [place], [place] [address]    Report Status 11/18/2021 FINAL  Final  Acid Fast Smear (AFB)     Status: None   Collection Time: 11/15/21  9:23 AM   Specimen: CSF; Cerebrospinal Fluid  Result Value Ref Range Status   AFB Specimen Processing Direct Inoculation  Final   Acid Fast Smear Negative  Final    Comment: (NOTE) Performed At: [facility] [place], [place] [id number] Rush Farmer MD RJ:[id number]    Source (AFB) CSF  Final    Comment: Performed at [facility], [place] [address],  [place], [place] [address]  Culture, fungus without smear     Status: None (Preliminary result)   Collection Time: 11/15/21  9:24 AM   Specimen: CSF; Other  Result Value Ref Range Status   Specimen Description CSF  Final   Special Requests Normal  Final   Culture   Final    NO FUNGUS ISOLATED AFTER 5 DAYS Performed at [facility], [address] [place], [place] [address]    Report Status PENDING  Incomplete    Lipid Panel Recent Labs    11/19/21 0328  TRIG 540*     Studies/Results: CT HEAD WO CONTRAST (5MM)  Result Date: 11/20/2021 CLINICAL DATA:  Mental status change with unknown cause. None reactive left pupil EXAM: CT HEAD WITHOUT CONTRAST TECHNIQUE: Contiguous axial images were obtained from the base of the skull through the vertex without intravenous contrast. RADIATION DOSE REDUCTION: This exam was performed according to the departmental dose-optimization program which includes automated exposure control, adjustment of the mA  and/or kV according to patient size and/or use of iterative reconstruction technique. COMPARISON:  Head CT from yesterday FINDINGS: Brain: Numerous brain lesions scattered in the brain, underestimated relative to prior MRI. These have a similar distribution when compared to prior CT. Recent right parietooccipital infarcts without detected progression. Remote high left frontal infarct with dense gliosis. No acute hemorrhage, hydrocephalus, or collection. No herniation. Vascular: Atheromatous calcification. Skull: Normal. Negative for fracture or focal lesion. Sinuses/Orbits: Patchy sinus opacification and bilateral mastoid opacification in the setting of nasal intubation. No evidence of orbital inflammation or mass to explain changes. IMPRESSION: 1. No change from recent head CT to explain the new deficit. 2. Known numerous brain lesions and subacute right cerebral infarcts. Electronically Signed   By: Jorje Guild M.D.   On: 11/20/2021 10:58   CT HEAD WO CONTRAST (5MM)  Result Date: 11/19/2021 CLINICAL DATA:  Mental status change.  Unable to wake up. EXAM: CT HEAD WITHOUT CONTRAST TECHNIQUE: Contiguous axial images were obtained from the base of the skull through the vertex without intravenous contrast. RADIATION DOSE REDUCTION: This exam was performed according to the departmental dose-optimization program which includes automated exposure control, adjustment of the mA and/or kV according to patient size and/or use of iterative reconstruction technique. COMPARISON:  11/12/2021 FINDINGS: Brain: New area of hypoattenuation noted in the superior, posterior right frontal lobe extending to the adjacent right parietal lobe, consistent with a recent infarction. Hypoattenuation involving the superior, medial left frontal lobe is stable from the prior CT. There are several small rounded areas of hypoattenuation in the cerebellar hemispheres left basal ganglia and cerebral hemispheres not evident on the prior head CT,  but consistent with a small lesions noted on the brain MRI from 11/14/2021. Ventricles are normal in size and configuration. No extra-axial masses or abnormal fluid collections. No intracranial hemorrhage. Vascular: No hyperdense vessel or unexpected calcification. Skull: Normal. Negative for fracture or focal lesion. Sinuses/Orbits: Globes and orbits are unremarkable. There is sinus disease with a right maxillary sinus air-fluid level, mild right maxillary, right frontal, bilateral ethmoid and right sphenoid sinus mucosal thickening. Small amount of right sphenoid sinus dependent fluid. Right and left mastoid air cell fluid. Other: None. IMPRESSION: 1. Relatively well-defined focus of hypoattenuation in the superior, medial right frontal lobe and adjacent right parietal lobe, consistent with recent infarction, occurring since the brain MRI dated 11/14/2021. 2. Several small hypoattenuating lesions noted in the basal ganglia on the left, cerebral hemispheres and cerebellar hemispheres, corresponding to  the lesions noted on the prior brain MRI. Differential diagnosis consistent with abscesses or metastatic disease. 3. Old left superior frontal lobe infarct stable from the prior head CT. Electronically Signed   By: Lajean Manes M.D.   On: 11/19/2021 14:32   CT CHEST WO CONTRAST  Result Date: 11/19/2021 CLINICAL DATA:  Increased Levophed requirement. Slow to wean off ventilator. Unable to wake up. EXAM: CT CHEST WITHOUT CONTRAST TECHNIQUE: Multidetector CT imaging of the chest was performed following the standard protocol without IV contrast. RADIATION DOSE REDUCTION: This exam was performed according to the departmental dose-optimization program which includes automated exposure control, adjustment of the mA and/or kV according to patient size and/or use of iterative reconstruction technique. COMPARISON:  11/12/2021. FINDINGS: Cardiovascular: Heart is normal in size and configuration. Dense three-vessel coronary  artery calcifications. No pericardial effusion. Is great vessels are normal in caliber. Stable aortic atherosclerotic calcifications. Mediastinum/Nodes: Endotracheal tube tip lies at the level of the upper [place], above the origin of the right mainstem bronchus. Nasal/orogastric tube passes below the diaphragm well into the stomach, below the included field of view. No neck base masses or adenopathy. No mediastinal or hilar masses. Several prominent mediastinal lymph nodes, largest a subcarinal node, 1.4 cm in short axis. Nodes have increased compared to the prior CT. Trachea and esophagus are unremarkable. Lungs/Pleura: Numerous, small ill-defined nodular ground-glass opacities noted throughout both lungs, predominating in the upper lobes, not evident on the previous CT. Masslike area of opacity noted in the right lower lobe is not convincingly changed, but is now partly obscured by right lower lobe atelectasis. There is opacity in the left lower lobe with depression of the oblique fissure posteriorly, also consistent with atelectasis. A component of this may reflect infection there is a small. Similar appearing area of opacity in the dependent left upper lobe lingula consistent with atelectasis and/or infection. Mild opacities noted in the anterior right middle lobe, new, consistent with infection and/or atelectasis. Trace left pleural fluid.  No pneumothorax. Upper Abdomen: Liver unremarkable. Subtle heterogeneous attenuation of the spleen with areas of relative decreased attenuation, nonspecific. Previous right nephrectomy. No acute findings. Musculoskeletal: No fracture or acute finding. No osteoblastic or osteolytic lesions. IMPRESSION: 1. Since the prior chest CT, numerous small ill-defined ground-glass nodular opacities have developed in both lungs. Multifocal infection is suspected. Findings could be inflammatory. 2. Bilateral lower lobe atelectasis, partly obscuring the right lower lobe cavitary  consolidative process noted on the prior CT, which otherwise has not changed. Lung base opacities have significantly increased from the previous CT. New trace left pleural effusion. Small areas of atelectasis or infection noted in the right middle lobe and left upper lobe lingula, also new. 3. Stable coronary artery calcifications and aortic atherosclerosis. 4. Prominent mediastinal lymph nodes increased from the prior CT, reactive. Aortic Atherosclerosis (ICD10-I70.0). Electronically Signed   By: Lajean Manes M.D.   On: 11/19/2021 14:43   DG CHEST PORT 1 VIEW  Result Date: 11/20/2021 CLINICAL DATA:  Endotracheal tube present. Altered mental status. Hypoxemia. EXAM: PORTABLE CHEST 1 VIEW COMPARISON:  AP chest 11/16/2021, CT chest 11/19/2021 and 11/12/2021 FINDINGS: The endotracheal tube tip terminates approximately 3.2 cm above the carina. Left subclavian approach central venous catheter tip overlies the superior vena cava/right atrial junction unchanged. Enteric tube descends below the diaphragm with the tip excluded by collimation. Improved aeration of the left lower lung with resolution of the prior atelectasis versus pneumonia. Resolution of the small left pleural effusion. There is increased opacification  around the right hilum compared to AP 11/16/2021 radiograph, which may reflect the atelectasis around the known cavitary lesion seen on yesterday's 11/19/2021 chest CT within the medial right lower lobe. Cardiac silhouette and mediastinal contours are within normal limits with mild calcification within aortic arch. No acute skeletal abnormality. IMPRESSION:: IMPRESSION: 1. Endotracheal tube in appropriate position. 2. Heterogeneous opacification right hilum corresponding to the atelectasis and cavitary lesion within the medial right lower lobe seen on yesterday's chest CT. 3. No definite pleural effusion is seen. Electronically Signed   By: Yvonne Kendall M.D.   On: 11/20/2021 08:41   [place] EKG SITE  RITE  Result Date: 11/20/2021 If [facility] image not attached, placement could not be confirmed due to current cardiac rhythm.   Medications: Scheduled:  chlorhexidine gluconate (MEDLINE KIT)  15 mL Mouth Rinse BID   Chlorhexidine Gluconate Cloth  6 each Topical Daily   docusate  100 mg Per Tube BID   heparin injection (subcutaneous)  5,000 Units Subcutaneous Q8H   insulin aspart  0-15 Units Subcutaneous Q4H   insulin aspart  8 Units Subcutaneous Q4H   insulin glargine-yfgn  10 Units Subcutaneous BID   mouth rinse  15 mL Mouth Rinse 10 times per day   pantoprazole sodium  40 mg Per Tube Daily   polyethylene glycol  17 g Per Tube Daily   sodium chloride flush  10-40 mL Intracatheter Q12H   Continuous:  sodium chloride     sodium chloride 5 mL/hr at 11/21/21 0720   sodium chloride     dexmedetomidine (PRECEDEX) IV infusion Stopped (11/20/21 0542)   feeding supplement (VITAL AF 1.2 CAL) 1,000 mL (11/21/21 0738)   fentaNYL infusion INTRAVENOUS 200 mcg/hr (11/21/21 0720)   lacosamide (VIMPAT) IV Stopped (11/21/21 0033)   levETIRAcetam Stopped (11/20/21 2132)   linezolid (ZYVOX) IV Stopped (11/20/21 2244)   meropenem (MERREM) IV Stopped (11/21/21 0256)   norepinephrine (LEVOPHED) Adult infusion 2 mcg/min (11/19/21 1247)   Assessment: 56 year old woman with a history of cervical cancer, status post nephrectomy 2 months ago, colostomy, chronic occlusion of the left common carotid artery, CAD, diabetes, depression, hypertension, remote history of stroke without residual deficits who was brought in by EMS for altered mental status. She had had a flulike illness for a week PTA and was found confused on 2/19. The patient had acute PEA arrest while in the ED on Sunday (2/19) with CPR time of 2 minutes. Diagnosed with acute respiratory failure with hypoxemia and was intubated at that time. MRI brain performed on Tuesday (2/21)revealed numerous (> 50) widely distributed lesions in the cerebral  hemispheres as well as the cerebellum.  - Exam today (2/27) on lower levels of sedation (fentanyl gtt) relative to yesterday reveals an unresponsive patient however today with only right reactive pupil, intact blink reflexes, roving EOM and slight response to pain with slight finger movement likely purposeful.  - LTM EEG,  11/17/2021 1127 to 11/18/2021 0730, to assess for seizure activity after CCM noticed some twitching in arm: Findings of continuous slowing, generalized and lateralized to the right hemisphere. This study is suggestive of cortical dysfunction in right hemisphere likely secondary to underlying structural abnormality as well as moderate to severe diffuse encephalopathy, nonspecific to etiology.  No seizures or definite epileptiform discharges were seen throughout the recording. Reordered LT EEG to start yesterday read still pending.  - MRI brain with and without contrast (2/21): Numerous (> 50) small, round DWI+ lesions are scattered throughout the brain parenchyma, infratentorially and  supratentorially, some with ring-like morphologies. On FLAIR images, several of the lesions exhibit adjacent vasogenic edema. Some of the lesions exhibit corresponding T1-hypointensity. A small subset of the lesions exhibit enhancement on the post-contrast images. Many lesions show evidence of mild associated hemorrhage. Restricted diffusion in the left lateral ventricle likely due to ventriculitis. 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 DDx for underlying pathogen includes bacterial (including tuberculosis), fungal (histoplasmosis, aspergillosis), protozoan (toxoplasmosis), parasitic (neurocysticercosis). Lower on the DDx would be an atypical presentation of CNS inflammatory disease such as MS or CNS lupus, or metastatic disease.   - Repeat CT of brain (2/26):Relatively well-defined focus of hypoattenuation in the superior,medial right  frontal lobe and adjacent right parietal lobe,consistent with recent infarction, occurring since the brain MRI dated 11/14/2021.Several small hypoattenuating lesions noted in the basal ganglia on the left, cerebral hemispheres and cerebellar hemispheres, corresponding to the lesions noted on the prior brain MRI.Differential diagnosis consistent with abscesses or metastatic disease. Old left superior frontal lobe infarct stable from the prior head CT. -WBC 22.4 > 17.6 > 16.3 > 14.2 -CSF from LP showed: cell count markedly abnormal with WBC 585 of which 84% were segmented neutrophils. Glucose 21 (low), Total protein 191 (high). Consistent with bacterial meningitis. -Cryptococcal antigen negative, Acid Fast Smear negative, remainder of infectious labs In process Bacterial culture with no growth x 3 days. AFB culture pending. CSF fungal culture with no fungus isolated after 4 days. Initial gram stain on fresh CSF was negative except for WBCs.  -Quantiferon Gold TB test: pending - Liver MRI recommended by ID: No worrisome lesions within the liver. Persistent region of hypoperfusion to the spleen suggests splenic infarctions.   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 On linezolid, cefepime and Flagyl. Per ID, linezolid to cover MRSA pneumonia, Listeria, and Nocardia as well as VRE coverage. Cefepime can lower seizure threshold and if she continues to have seizures recommend changing cefepime to another appropriate abx.  - Follow up on remaining CSF studies including VDRL, fungal stain ( no growth 4 days), final fungal and bacterial cultures pending.  - TEE has been ordered and is pending. To be performed due to concerns of possible SBE - Continue Keppra at 1500 mg BID - Wean sedation as tolerated - repeat [facility] 11/20/21 to rule out hydrocephalus- not worrisome for hydrocephalus upon my review.  - LT EEG- pending  35 minutes spent in the neurological evaluation and management of this  critically ill patient.     LOS: 9 days   Merilynn Haydu Evalina Field, Neuro NP  11/21/2021  9:28 AM

## 2021-11-21 NOTE — Progress Notes (Signed)
Bilateral upper extremity venous duplex completed. Refer to "CV Proc" under chart review to view preliminary results.  11/21/2021 4:11 PM Kelby Aline., MHA, RVT, RDCS, RDMS

## 2021-11-21 NOTE — Progress Notes (Signed)
Initial Nutrition Assessment  DOCUMENTATION CODES:   Not applicable  INTERVENTION:   Tube Feeding via Cortrak:   Vital AF 1.2 at 65 ml/hr This provides 117 g of protein, 1872 kcals, 1264 mL of free water   NUTRITION DIAGNOSIS:   Inadequate oral intake related to acute illness as evidenced by NPO status.  GOAL:   Patient will meet greater than or equal to 90% of their needs   MONITOR:   Vent status, Labs, Weight trends, TF tolerance  REASON FOR ASSESSMENT:   Ventilator    ASSESSMENT:   56 yo female admitted with encephalopathy, acute PEA arrest, upper GI bleed with hemoptysis. 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 Head CT no acute findings.  2/21 MRI brain with numerous lesions, mild edema, concerned for brain abscesses 2/22 LP consistent with bacterial meningitis 2/25 MRI LIver with no lesions 2/27 Head CT-no new changes  Pt remains on vent support; remains on LTM. Off pressors.   Vital AF 1.2 at 65 mlhr via Cortrak  +stool via colostomy; 350 mL in 24 hours.   Current wt 78.6 kg; admit wt 75.6 kg. Net +5 L per I/O  No new skin breakdown per RN assessment  Labs: CBGs 183-286 Meds: ss novolog, novolog q 4 hours, semglee, colace, miralax     Diet Order:   Diet Order             Diet NPO time specified  Diet effective midnight                   EDUCATION NEEDS:   Not appropriate for education at this time  Skin:  Skin Assessment: Reviewed RN Assessment  Last BM:  +stool via colostomy  Height:   Ht Readings from Last 1 Encounters:  11/12/21 5\' 2"  (1.575 m)    Weight:   Wt Readings from Last 1 Encounters:  11/21/21 78.6 kg     BMI:  Body mass index is 31.69 kg/m.  Estimated Nutritional  Needs:   Kcal:  1800-2000 kcals  Protein:  115-130 g  Fluid:  >/= 1.8 L   Kerman Passey MS, RDN, LDN, CNSC Registered Dietitian III Clinical Nutrition RD Pager and On-Call Pager Number Located in [place]

## 2021-11-21 NOTE — Procedures (Addendum)
Patient Name: Kristen Murphy  MRN: [id number]  Epilepsy Attending: Lora Havens  Referring Physician/Provider: Donnetta Simpers, MD Duration: 11/20/2021 7903 to 11/21/2021 8333   Patient history: 56 year old woman with a history of cervical cancer, status post nephrectomy 2 months ago, colostomy, CAD, diabetes, depression, hypertension, remote history of stroke without residual deficits who was brought in by EMS for altered mental status.  EEG to evaluate for seizure.   Level of alertness: lethargic   AEDs during EEG study: LEV, Vimpat   Technical aspects: This EEG study was done with scalp electrodes positioned according to the 10-20 International system of electrode placement. Electrical activity was acquired at a sampling rate of 500Hz  and reviewed with a high frequency filter of 70Hz  and a low frequency filter of 1Hz . EEG data were recorded continuously and digitally stored.    Description: EEG showed continuous generalized 3 to 6 Hz theta-delta slowing. EEG also showed generalized periodic discharges at 0.5-1hz , more prominent when awake/stimulated hyperventilation and photic stimulation were not performed.      Patient event button was pressed on 11/20/2021 at 1620, [id number] and on 11/18/2021 0044 for generalized twitching and eyes rolled back. Concomitant EEG before, during and after the event showed generalized periodic discharges at 0.5-1hz . However, EEG interpretation was limited due to significant movement artifact.   ABNORMALITY - Periodic discharges, generalized - Continuous slow, generalized    IMPRESSION: This study showed evidence of epileptogenicity with generalized onset, more prominent when awake/stimulated.  This EEG pattern can also be seen due to toxic-metabolic causes like cefepime toxicity, anoxic/hypoxic brain injury.  Additionally there is moderate to severe diffuse encephalopathy, nonspecific etiology.    Event button was pressed on 11/20/2021 at 1620, 156 [id number]  and on 11/18/2021 0044 for generalized twitching and eyes rolled back. Concomitant EEG showed generalized periodic discharges.  However, there was significant movement artifact.  Therefore clinical correlation is recommended.   Nathaniel Wakeley [name]

## 2021-11-21 NOTE — Progress Notes (Signed)
VAST consult to remove RA PICC d/t DVT. Discussed with Carlis Abbott, DO possible treatment of DVT by keeping working PICC in place and administering anticoagulant per CHEST guidelines. Clark, DO stated PICC no longer necessary as patient off pressors and has other IV access. RA DL PICC removed per protocol per MD order. Manual pressure applied for 5 mins. Vaseline gauze, gauze, and Tegaderm applied over insertion site. No bleeding or swelling noted. Dressing to remain dry and intact for 24 hours.

## 2021-11-21 NOTE — Progress Notes (Signed)
LTM EEG maint complete.

## 2021-11-22 DIAGNOSIS — G060 Intracranial abscess and granuloma: Secondary | ICD-10-CM | POA: Diagnosis not present

## 2021-11-22 DIAGNOSIS — R569 Unspecified convulsions: Secondary | ICD-10-CM | POA: Diagnosis not present

## 2021-11-22 DIAGNOSIS — G9340 Encephalopathy, unspecified: Secondary | ICD-10-CM

## 2021-11-22 DIAGNOSIS — Z9911 Dependence on respirator [ventilator] status: Secondary | ICD-10-CM | POA: Diagnosis not present

## 2021-11-22 DIAGNOSIS — I469 Cardiac arrest, cause unspecified: Secondary | ICD-10-CM | POA: Diagnosis not present

## 2021-11-22 DIAGNOSIS — J9601 Acute respiratory failure with hypoxia: Secondary | ICD-10-CM | POA: Diagnosis not present

## 2021-11-22 LAB — CBC
HCT: 31.5 % — ABNORMAL LOW (ref 36.0–46.0)
Hemoglobin: 9.1 g/dL — ABNORMAL LOW (ref 12.0–15.0)
MCH: 27.4 pg (ref 26.0–34.0)
MCHC: 28.9 g/dL — ABNORMAL LOW (ref 30.0–36.0)
MCV: 94.9 fL (ref 80.0–100.0)
Platelets: 219 10*3/uL (ref 150–400)
RBC: 3.32 MIL/uL — ABNORMAL LOW (ref 3.87–5.11)
RDW: 18.6 % — ABNORMAL HIGH (ref 11.5–15.5)
WBC: 13.7 10*3/uL — ABNORMAL HIGH (ref 4.0–10.5)
nRBC: 0.1 % (ref 0.0–0.2)

## 2021-11-22 LAB — GLUCOSE, CAPILLARY
Glucose-Capillary: 192 mg/dL — ABNORMAL HIGH (ref 70–99)
Glucose-Capillary: 206 mg/dL — ABNORMAL HIGH (ref 70–99)
Glucose-Capillary: 207 mg/dL — ABNORMAL HIGH (ref 70–99)
Glucose-Capillary: 214 mg/dL — ABNORMAL HIGH (ref 70–99)
Glucose-Capillary: 206 mg/dL — ABNORMAL HIGH (ref 70–99)

## 2021-11-22 LAB — BASIC METABOLIC PANEL
Anion gap: 9 (ref 5–15)
BUN: 41 mg/dL — ABNORMAL HIGH (ref 6–20)
CO2: 29 mmol/L (ref 22–32)
Calcium: 9.1 mg/dL (ref 8.9–10.3)
Chloride: 104 mmol/L (ref 98–111)
Creatinine, Ser: 0.79 mg/dL (ref 0.44–1.00)
GFR, Estimated: >60 mL/min (ref >60)
Glucose, Bld: 207 mg/dL — ABNORMAL HIGH (ref 70–99)
Potassium: 4 mmol/L (ref 3.5–5.1)
Sodium: 142 mmol/L (ref 135–145)

## 2021-11-22 LAB — MAGNESIUM: Magnesium: 2.4 mg/dL (ref 1.7–2.4)

## 2021-11-22 LAB — VDRL, CSF: VDRL Quant, CSF: NONREACTIVE

## 2021-11-22 LAB — PHOSPHORUS: Phosphorus: 1.8 mg/dL — ABNORMAL LOW (ref 2.5–4.6)

## 2021-11-22 MED ORDER — FUROSEMIDE 10 MG/ML IJ SOLN
40.0000 mg | Freq: Once | INTRAMUSCULAR | Status: AC
  Administered 2021-11-22: 40 mg via INTRAVENOUS
  Filled 2021-11-22: qty 4

## 2021-11-22 MED ORDER — POTASSIUM CHLORIDE CRYS ER 20 MEQ PO TBCR
20.0000 meq | EXTENDED_RELEASE_TABLET | Freq: Once | ORAL | Status: AC
  Administered 2021-11-22: 20 meq via ORAL
  Filled 2021-11-22: qty 1

## 2021-11-22 MED ORDER — SODIUM PHOSPHATES 45 MMOLE/15ML IV SOLN
30.0000 mmol | Freq: Once | INTRAVENOUS | Status: AC
  Administered 2021-11-22: 30 mmol via INTRAVENOUS
  Filled 2021-11-22: qty 10

## 2021-11-22 MED ORDER — ARTIFICIAL TEARS OPHTHALMIC OINT
TOPICAL_OINTMENT | Freq: Three times a day (TID) | OPHTHALMIC | Status: DC
  Administered 2021-11-24 – 2021-12-01 (×11): 1 via OPHTHALMIC
  Filled 2021-11-22 (×7): qty 3.5

## 2021-11-22 NOTE — Progress Notes (Addendum)
Neurology Progress Note  Subjective: No acute overnight events noted. Patient remains on fentanyl @ 180 mcg/hr without change in neurologic status from yesterday.  Objective: Current vital signs: BP (!) 152/60 (BP Location: Left Wrist)    Pulse 70    Temp 100 F (37.8 C) (Axillary)    Resp (!) 23    Ht _0  (1.575 m)    Wt 78.4 kg    SpO2 92%    BMI 31.61 kg/m  Vital signs in last 24 hours: Temp:  [98.9 F (37.2 C)-100 F (37.8 C)] 100 F (37.8 C) (03/01 0830) Pulse Rate:  [56-136] 70 (03/01 0800) Resp:  [16-42] 23 (03/01 0800) BP: (116-231)/(40-110) 152/60 (03/01 0800) SpO2:  [89 %-97 %] 92 % (03/01 0802) FiO2 (%):  [40 %-45 %] 45 % (03/01 0802) Weight:  [78.4 kg] 78.4 kg (03/01 0349)  Intake/Output from previous day: 02/28 0701 - 03/01 0700 In: 4383.8 [I.V.:757.3; IW/OE:3212.2; IV Piggyback:1948.7] Out: 1410 [Urine:1010; Stool:400] Intake/Output this shift: No intake/output data recorded. Nutritional status:  Diet Order             Diet NPO time specified  Diet effective midnight                  HEENT: Normocephalic and atraumatic. Respiratory: Oral ETT in place, secured, respirations assisted via mechanical ventilation. Patient does have spontaneous respirations over set ventilator rate.   Neurologic Exam: Performed while on fentanyl gtt at 180 mcg/h Mental Status: Patient is intubated and sedated in the ICU. Her eyes are open on exam initially with an upward gaze.   Obtunded/sedated and minimally responsive to noxious stimuli.  Does not fixate or track examiner.  Patient does not follow commands. No attempts to communicate.  CN: Pupils are 4 mm and briskly reactive to light. Positive doll eyes. No blink to threat. Cough and corneal reflexes are intact. Face flaccidly symmetric. Does not protrude tongue to command.  Motor/Sensory: She has minimal movement to noxious stimuli of bilateral upper extremities right > left with noxious stimuli application via  trapezius squeeze. She does not have any movement of bilateral lower extremities spontaneously or with application of noxious stimuli. Other: No jerking, twitching or myoclonus seen.    Lab Results: Results for orders placed or performed during the hospital encounter of 11/12/21 (from the past 48 hour(s))  Glucose, capillary     Status: Abnormal   Collection Time: 11/20/21 11:55 AM  Result Value Ref Range   Glucose-Capillary 235 (H) 70 - 99 mg/dL    Comment: Glucose reference range applies only to samples taken after fasting for at least 8 hours.  Glucose, capillary     Status: Abnormal   Collection Time: 11/20/21  3:22 PM  Result Value Ref Range   Glucose-Capillary 183 (H) 70 - 99 mg/dL    Comment: Glucose reference range applies only to samples taken after fasting for at least 8 hours.  Glucose, capillary     Status: Abnormal   Collection Time: 11/20/21  7:39 PM  Result Value Ref Range   Glucose-Capillary 277 (H) 70 - 99 mg/dL    Comment: Glucose reference range applies only to samples taken after fasting for at least 8 hours.  Glucose, capillary     Status: Abnormal   Collection Time: 11/20/21 11:04 PM  Result Value Ref Range   Glucose-Capillary 286 (H) 70 - 99 mg/dL    Comment: Glucose reference range applies only to samples taken after fasting for at least 8  hours.  Glucose, capillary     Status: Abnormal   Collection Time: 11/21/21  3:45 AM  Result Value Ref Range   Glucose-Capillary 215 (H) 70 - 99 mg/dL    Comment: Glucose reference range applies only to samples taken after fasting for at least 8 hours.  Basic metabolic panel     Status: Abnormal   Collection Time: 11/21/21  3:46 AM  Result Value Ref Range   Sodium 143 135 - 145 mmol/L   Potassium 3.9 3.5 - 5.1 mmol/L   Chloride 100 98 - 111 mmol/L   CO2 32 22 - 32 mmol/L   Glucose, Bld 227 (H) 70 - 99 mg/dL    Comment: Glucose reference range applies only to samples taken after fasting for at least 8 hours.   BUN 38  (H) 6 - 20 mg/dL   Creatinine, Ser 0.81 0.44 - 1.00 mg/dL   Calcium 9.0 8.9 - 10.3 mg/dL   GFR, Estimated >60 >60 mL/min    Comment: (NOTE) Calculated using the CKD-EPI Creatinine Equation (2021)    Anion gap 11 5 - 15    Comment: Performed at [facility] [address], [place], [place] [address]  Magnesium     Status: None   Collection Time: 11/21/21  3:46 AM  Result Value Ref Range   Magnesium 2.4 1.7 - 2.4 mg/dL    Comment: Performed at [facility] [address], [place], [place] [address]  Phosphorus     Status: None   Collection Time: 11/21/21  3:46 AM  Result Value Ref Range   Phosphorus 2.6 2.5 - 4.6 mg/dL    Comment: Performed at [facility] [address], [place], [place] [address]  CBC     Status: Abnormal   Collection Time: 11/21/21  3:46 AM  Result Value Ref Range   WBC 10.9 (H) 4.0 - 10.5 K/uL   RBC 3.30 (L) 3.87 - 5.11 MIL/uL   Hemoglobin 9.3 (L) 12.0 - 15.0 g/dL   HCT 31.2 (L) 36.0 - 46.0 %   MCV 94.5 80.0 - 100.0 fL   MCH 28.2 26.0 - 34.0 pg   MCHC 29.8 (L) 30.0 - 36.0 g/dL   RDW 18.2 (H) 11.5 - 15.5 %   Platelets 205 150 - 400 K/uL   nRBC 0.3 (H) 0.0 - 0.2 %    Comment: Performed at [facility], [place] [address], [place], [place] [address]  Glucose, capillary     Status: Abnormal   Collection Time: 11/21/21  7:51 AM  Result Value Ref Range   Glucose-Capillary 244 (H) 70 - 99 mg/dL    Comment: Glucose reference range applies only to samples taken after fasting for at least 8 hours.  Glucose, capillary     Status: Abnormal   Collection Time: 11/21/21 11:42 AM  Result Value Ref Range   Glucose-Capillary 293 (H) 70 - 99 mg/dL    Comment: Glucose reference range applies only to samples taken after fasting for at least 8 hours.  Glucose, capillary     Status: Abnormal   Collection Time: 11/21/21  4:18 PM  Result Value Ref Range   Glucose-Capillary 260 (H) 70 - 99 mg/dL    Comment: Glucose reference range applies only to samples  taken after fasting for at least 8 hours.   Comment 1 Notify RN   Glucose, capillary     Status: Abnormal   Collection Time: 11/21/21  7:52 PM  Result  Value Ref Range   Glucose-Capillary 218 (H) 70 - 99 mg/dL    Comment: Glucose reference range applies only to samples taken after fasting for at least 8 hours.  Glucose, capillary     Status: Abnormal   Collection Time: 11/21/21 11:34 PM  Result Value Ref Range   Glucose-Capillary 221 (H) 70 - 99 mg/dL    Comment: Glucose reference range applies only to samples taken after fasting for at least 8 hours.  Basic metabolic panel     Status: Abnormal   Collection Time: 11/22/21  1:39 AM  Result Value Ref Range   Sodium 142 135 - 145 mmol/L   Potassium 4.0 3.5 - 5.1 mmol/L   Chloride 104 98 - 111 mmol/L   CO2 29 22 - 32 mmol/L   Glucose, Bld 207 (H) 70 - 99 mg/dL    Comment: Glucose reference range applies only to samples taken after fasting for at least 8 hours.   BUN 41 (H) 6 - 20 mg/dL   Creatinine, Ser 0.79 0.44 - 1.00 mg/dL   Calcium 9.1 8.9 - 10.3 mg/dL   GFR, Estimated >60 >60 mL/min    Comment: (NOTE) Calculated using the CKD-EPI Creatinine Equation (2021)    Anion gap 9 5 - 15    Comment: Performed at [facility] [address] [place], [place] [address]  Magnesium     Status: None   Collection Time: 11/22/21  1:39 AM  Result Value Ref Range   Magnesium 2.4 1.7 - 2.4 mg/dL    Comment: Performed at [facility] [address], [place], [place] [address]  Phosphorus     Status: Abnormal   Collection Time: 11/22/21  1:39 AM  Result Value Ref Range   Phosphorus 1.8 (L) 2.5 - 4.6 mg/dL    Comment: Performed at [facility] [address] [place], [place] [address]  CBC     Status: Abnormal   Collection Time: 11/22/21  1:39 AM  Result Value Ref Range   WBC 13.7 (H) 4.0 - 10.5 K/uL   RBC 3.32 (L) 3.87 - 5.11 MIL/uL   Hemoglobin 9.1 (L) 12.0 - 15.0 g/dL   HCT 31.5 (L) 36.0 - 46.0 %   MCV 94.9 80.0 -  100.0 fL   MCH 27.4 26.0 - 34.0 pg   MCHC 28.9 (L) 30.0 - 36.0 g/dL   RDW 18.6 (H) 11.5 - 15.5 %   Platelets 219 150 - 400 K/uL   nRBC 0.1 0.0 - 0.2 %    Comment: Performed at [facility] [address] [place], [place] [address]  Glucose, capillary     Status: Abnormal   Collection Time: 11/22/21  3:18 AM  Result Value Ref Range   Glucose-Capillary 192 (H) 70 - 99 mg/dL    Comment: Glucose reference range applies only to samples taken after fasting for at least 8 hours.  Glucose, capillary     Status: Abnormal   Collection Time: 11/22/21  8:31 AM  Result Value Ref Range   Glucose-Capillary 206 (H) 70 - 99 mg/dL    Comment: Glucose reference range applies only to samples taken after fasting for at least 8 hours.    Recent Results (from the past 240 hour(s))  Urine Culture     Status: Abnormal   Collection Time: 11/12/21  3:36 PM   Specimen: In/Out Cath Urine  Result Value Ref Range Status   Specimen Description IN/OUT CATH URINE  Final   Special Requests   Final    NONE Performed at [facility], [place] [address], [place], [place] [address]    Culture (A)  Final    1,000 COLONIES/mL ENTEROBACTER CLOACAE 3,000 COLONIES/mL PSEUDOMONAS AERUGINOSA    Report Status 11/15/2021 FINAL  Final   Organism ID, Bacteria ENTEROBACTER CLOACAE (A)  Final   Organism ID, Bacteria PSEUDOMONAS AERUGINOSA (A)  Final      Susceptibility   Enterobacter cloacae - MIC*    CEFAZOLIN >=64 RESISTANT Resistant     CEFEPIME <=0.12 SENSITIVE Sensitive     CIPROFLOXACIN <=0.25 SENSITIVE Sensitive     GENTAMICIN <=1 SENSITIVE Sensitive     IMIPENEM <=0.25 SENSITIVE Sensitive     NITROFURANTOIN 32 SENSITIVE Sensitive     TRIMETH/SULFA <=20 SENSITIVE Sensitive     PIP/TAZO <=4 SENSITIVE Sensitive     * 1,000 COLONIES/mL ENTEROBACTER CLOACAE   Pseudomonas aeruginosa - MIC*    CEFTAZIDIME 4 SENSITIVE Sensitive     CIPROFLOXACIN <=0.25 SENSITIVE Sensitive     GENTAMICIN <=1 SENSITIVE  Sensitive     IMIPENEM 2 SENSITIVE Sensitive     PIP/TAZO 8 SENSITIVE Sensitive     CEFEPIME 2 SENSITIVE Sensitive     * 3,000 COLONIES/mL PSEUDOMONAS AERUGINOSA  Respiratory (~20 pathogens) panel by PCR     Status: None   Collection Time: 11/12/21  3:37 PM   Specimen: Nasopharyngeal Swab; Respiratory  Result Value Ref Range Status   Adenovirus NOT DETECTED NOT DETECTED Final   Coronavirus 229E NOT DETECTED NOT DETECTED Final    Comment: (NOTE) The Coronavirus on the Respiratory Panel, DOES NOT test for the novel  Coronavirus (2019 nCoV)    Coronavirus HKU1 NOT DETECTED NOT DETECTED Final   Coronavirus NL63 NOT DETECTED NOT DETECTED Final   Coronavirus OC43 NOT DETECTED NOT DETECTED Final   Metapneumovirus NOT DETECTED NOT DETECTED Final   Rhinovirus / Enterovirus NOT DETECTED NOT DETECTED Final   Influenza A NOT DETECTED NOT DETECTED Final   Influenza B NOT DETECTED NOT DETECTED Final   Parainfluenza Virus 1 NOT DETECTED NOT DETECTED Final   Parainfluenza Virus 2 NOT DETECTED NOT DETECTED Final   Parainfluenza Virus 3 NOT DETECTED NOT DETECTED Final   Parainfluenza Virus 4 NOT DETECTED NOT DETECTED Final   Respiratory Syncytial Virus NOT DETECTED NOT DETECTED Final   Bordetella pertussis NOT DETECTED NOT DETECTED Final   Bordetella Parapertussis NOT DETECTED NOT DETECTED Final   Chlamydophila pneumoniae NOT DETECTED NOT DETECTED Final   Mycoplasma pneumoniae NOT DETECTED NOT DETECTED Final    Comment: Performed at [facility], [place]. [address], [place], [place] [address]  Blood Culture (routine x 2)     Status: None   Collection Time: 11/12/21  4:23 PM   Specimen: Site Not Specified; Blood  Result Value Ref Range Status   Specimen Description SITE NOT SPECIFIED  Final   Special Requests   Final    BOTTLES DRAWN AEROBIC AND ANAEROBIC Blood Culture adequate volume   Culture   Final    NO GROWTH 5 DAYS Performed at [facility], [place] [address], [place], [place]  [address]    Report Status 11/17/2021 FINAL  Final  Resp Panel by RT-PCR (Flu A&B, Covid) Nasopharyngeal Swab     Status: None   Collection Time: 11/12/21  4:25 PM   Specimen: Nasopharyngeal Swab; Nasopharyngeal(NP) swabs in vial transport medium  Result Value Ref Range Status   SARS Coronavirus 2  by RT PCR NEGATIVE NEGATIVE Final    Comment: (NOTE) SARS-CoV-2 target nucleic acids are NOT DETECTED.  The SARS-CoV-2 RNA is generally detectable in upper respiratory specimens during the acute phase of infection. The lowest concentration of SARS-CoV-2 viral copies this assay can detect is 138 copies/mL. A negative result does not preclude SARS-Cov-2 infection and should not be used as the sole basis for treatment or other patient management decisions. A negative result may occur with  improper specimen collection/handling, submission of specimen other than nasopharyngeal swab, presence of viral mutation(s) within the areas targeted by this assay, and inadequate number of viral copies(<138 copies/mL). A negative result must be combined with clinical observations, patient history, and epidemiological information. The expected result is Negative.  Fact Sheet for Patients:  EntrepreneurPulse.com.au  Fact Sheet for Healthcare Providers:  IncredibleEmployment.be  This test is no t yet approved or cleared by the [place] FDA and  has been authorized for detection and/or diagnosis of SARS-CoV-2 by FDA under an Emergency Use Authorization (EUA). This EUA will remain  in effect (meaning this test can be used) for the duration of the COVID-19 declaration under Section 564(b)(1) of the Act, 21 U.S.C.section 360bbb-3(b)(1), unless the authorization is terminated  or revoked sooner.       Influenza A by PCR NEGATIVE NEGATIVE Final   Influenza B by PCR NEGATIVE NEGATIVE Final    Comment: (NOTE) The Xpert Xpress SARS-CoV-2/FLU/RSV plus assay is intended as an  aid in the diagnosis of influenza from Nasopharyngeal swab specimens and should not be used as a sole basis for treatment. Nasal washings and aspirates are unacceptable for Xpert Xpress SARS-CoV-2/FLU/RSV testing.  Fact Sheet for Patients: EntrepreneurPulse.com.au  Fact Sheet for Healthcare Providers: IncredibleEmployment.be  This test is not yet approved or cleared by the [place] FDA and has been authorized for detection and/or diagnosis of SARS-CoV-2 by FDA under an Emergency Use Authorization (EUA). This EUA will remain in effect (meaning this test can be used) for the duration of the COVID-19 declaration under Section 564(b)(1) of the Act, 21 U.S.C. section 360bbb-3(b)(1), unless the authorization is terminated or revoked.  Performed at [facility], [place] [address], [place], [place] [address]   Blood Culture (routine x 2)     Status: None   Collection Time: 11/12/21  4:44 PM   Specimen: Site Not Specified; Blood  Result Value Ref Range Status   Specimen Description SITE NOT SPECIFIED  Final   Special Requests   Final    BOTTLES DRAWN AEROBIC AND ANAEROBIC Blood Culture results may not be optimal due to an excessive volume of blood received in culture bottles   Culture   Final    NO GROWTH 5 DAYS Performed at [facility], [place] [address], [place], [place] [address]    Report Status 11/17/2021 FINAL  Final  MRSA Next Gen by PCR, Nasal     Status: Abnormal   Collection Time: 11/12/21  7:36 PM   Specimen: Nasal Mucosa; Nasal Swab  Result Value Ref Range Status   MRSA by PCR Next Gen DETECTED (A) NOT DETECTED Final    Comment: RESULT CALLED TO, READ BACK BY AND VERIFIED WITH: V NGUYEN,RN_0  11/12/21 [place] (NOTE) The GeneXpert MRSA Assay (FDA approved for NASAL specimens only), is one component of a comprehensive MRSA colonization surveillance program. It is not intended to diagnose MRSA infection nor to guide or monitor  treatment for MRSA infections. Test performance is not FDA approved in patients  less than 28 years old. Performed at [facility], [place] [address] [place], [place] [address]   Culture, Respiratory w Gram Stain     Status: None   Collection Time: 11/13/21  2:09 AM   Specimen: Bronchoalveolar Lavage; Respiratory  Result Value Ref Range Status   Specimen Description BRONCHIAL ALVEOLAR LAVAGE  Final   Special Requests NONE  Final   Gram Stain   Final    MODERATE WBC PRESENT, PREDOMINANTLY PMN FEW GRAM POSITIVE COCCI IN PAIRS AND CHAINS Performed at [facility], [address] [place], [place] [address]    Culture FEW METHICILLIN RESISTANT STAPHYLOCOCCUS AUREUS  Final   Report Status 11/15/2021 FINAL  Final   Organism ID, Bacteria METHICILLIN RESISTANT STAPHYLOCOCCUS AUREUS  Final      Susceptibility   Methicillin resistant staphylococcus aureus - MIC*    CIPROFLOXACIN >=8 RESISTANT Resistant     ERYTHROMYCIN >=8 RESISTANT Resistant     GENTAMICIN <=0.5 SENSITIVE Sensitive     OXACILLIN >=4 RESISTANT Resistant     TETRACYCLINE <=1 SENSITIVE Sensitive     VANCOMYCIN 1 SENSITIVE Sensitive     TRIMETH/SULFA <=10 SENSITIVE Sensitive     CLINDAMYCIN <=0.25 SENSITIVE Sensitive     RIFAMPIN <=0.5 SENSITIVE Sensitive     Inducible Clindamycin NEGATIVE Sensitive     * FEW METHICILLIN RESISTANT STAPHYLOCOCCUS AUREUS  CSF culture w Stat Gram Stain     Status: None   Collection Time: 11/15/21  8:02 AM   Specimen: CSF; Cerebrospinal Fluid  Result Value Ref Range Status   Specimen Description CSF  Final   Special Requests NONE  Final   Gram Stain   Final    WBC PRESENT,BOTH PMN AND MONONUCLEAR NO ORGANISMS SEEN CYTOSPIN SMEAR    Culture   Final    NO GROWTH Performed at [facility], [place] [address] [place], [place] [address]    Report Status 11/18/2021 FINAL  Final  Toxoplasma Gondii, PCR     Status: None   Collection Time: 11/15/21  8:20 AM   Specimen:  Cerebrospinal Fluid  Result Value Ref Range Status   Toxoplasma Gondii, PCR Negative Negative Final    Comment: (NOTE) No Toxoplasma gondii DNA detected. This test was developed and its performance characteristics determined by [employer]. It has not been cleared or approved by the U.S. Food and Drug Administration. The FDA has determined that such clearance or approval is not necessary. This test is used for clinical purposes. It should not be regarded as investigational or research. Performed At: [facility] [place], [place] [id number] Rush Farmer MD [id number]   Acid Fast Smear (AFB)     Status: None   Collection Time: 11/15/21  9:23 AM   Specimen: CSF; Cerebrospinal Fluid  Result Value Ref Range Status   AFB Specimen Processing Direct Inoculation  Final   Acid Fast Smear Negative  Final    Comment: (NOTE) Performed At: [facility] [place], [place] [id number] Rush Farmer MD [id number]    Source (AFB) CSF  Final    Comment: Performed at [facility], [place] [address] [place], [place] [address]  Culture, fungus without smear     Status: None (Preliminary result)   Collection Time: 11/15/21  9:24 AM   Specimen: CSF; Other  Result Value Ref Range Status   Specimen Description CSF  Final   Special Requests Normal  Final   Culture   Final  NO FUNGUS ISOLATED AFTER 6 DAYS Performed at [facility], [place] [address], [place], [place] [address]    Report Status PENDING  Incomplete   Lipid Panel No results for input(s): CHOL, TRIG, HDL, CHOLHDL, VLDL, LDLCALC in the last 72 hours.  Studies/Results: CT HEAD WO CONTRAST (5MM)  Result Date: 11/20/2021 CLINICAL DATA:  Mental status change with unknown cause. None reactive left pupil EXAM: CT HEAD WITHOUT CONTRAST TECHNIQUE: Contiguous axial images were obtained from the base of the skull through the vertex without intravenous contrast. RADIATION DOSE  REDUCTION: This exam was performed according to the departmental dose-optimization program which includes automated exposure control, adjustment of the mA and/or kV according to patient size and/or use of iterative reconstruction technique. COMPARISON:  Head CT from yesterday FINDINGS: Brain: Numerous brain lesions scattered in the brain, underestimated relative to prior MRI. These have a similar distribution when compared to prior CT. Recent right parietooccipital infarcts without detected progression. Remote high left frontal infarct with dense gliosis. No acute hemorrhage, hydrocephalus, or collection. No herniation. Vascular: Atheromatous calcification. Skull: Normal. Negative for fracture or focal lesion. Sinuses/Orbits: Patchy sinus opacification and bilateral mastoid opacification in the setting of nasal intubation. No evidence of orbital inflammation or mass to explain changes. IMPRESSION: 1. No change from recent head CT to explain the new deficit. 2. Known numerous brain lesions and subacute right cerebral infarcts. Electronically Signed   By: Jorje Guild M.D.   On: 11/20/2021 10:58   Overnight EEG with video  Result Date: 11/21/2021 Lora Havens, MD     11/21/2021 10:47 AM Patient Name: ARELIS NEUMEIER MRN: [id number] Epilepsy Attending: Lora Havens Referring Physician/Provider: Donnetta Simpers, MD Duration: 11/20/2021 6295 to 11/21/2021 2841  Patient history: 57 year old woman with a history of cervical cancer, status post nephrectomy 2 months ago, colostomy, CAD, diabetes, depression, hypertension, remote history of stroke without residual deficits who was brought in by EMS for altered mental status.  EEG to evaluate for seizure.  Level of alertness: lethargic  AEDs during EEG study: LEV, Vimpat  Technical aspects: This EEG study was done with scalp electrodes positioned according to the 10-20 International system of electrode placement. Electrical activity was acquired at a sampling  rate of _0  and reviewed with a high frequency filter of _1  and a low frequency filter of _2 . EEG data were recorded continuously and digitally stored.  Description: EEG showed continuous generalized 3 to 6 Hz theta-delta slowing. EEG also showed generalized periodic discharges at 0.5-_3 , more prominent when awake/stimulated hyperventilation and photic stimulation were not performed.    Patient event button was pressed on 11/20/2021 at 1620, 156 1702 and on 11/18/2021 0044 for generalized twitching and eyes rolled back. Concomitant EEG before, during and after the event showed generalized periodic discharges at 0.5-_4 . However, EEG interpretation was limited due to significant movement artifact.  ABNORMALITY - Periodic discharges, generalized - Continuous slow, generalized  IMPRESSION: This study showed evidence of epileptogenicity with generalized onset, more prominent when awake/stimulated. Additionally there is moderate to severe diffuse encephalopathy, nonspecific etiology.  Event button was pressed on 11/20/2021 at 1620, 156 1702 and on 11/18/2021 0044 for generalized twitching and eyes rolled back. Concomitant EEG showed generalized periodic discharges.  However, there was significant movement artifact.  Therefore clinical correlation is recommended.  Priyanka O Yadav   VAS [place] UPPER EXTREMITY VENOUS DUPLEX  Result Date: 11/21/2021 UPPER VENOUS STUDY  Patient Name:  MICHIKO LINEMAN  Date of Exam:   11/21/2021 Medical  Rec #: [id number]        Accession #:    [id number] Date of Birth: 24-Oct-1965        Patient Gender: F Patient Age:   56 years Exam Location:  [facility] Procedure:      VAS [place] UPPER EXTREMITY VENOUS DUPLEX Referring Phys: Noemi Chapel --------------------------------------------------------------------------------  Indications: Edema Comparison Study: No prior study Performing Technologist: Maudry Mayhew MHA, RDMS, RVT, RDCS  Examination Guidelines: A complete evaluation includes  B-mode imaging, spectral Doppler, color Doppler, and power Doppler as needed of all accessible portions of each vessel. Bilateral testing is considered an integral part of a complete examination. Limited examinations for reoccurring indications may be performed as noted.  Right Findings: +----------+------------+---------+-----------+----------+--------------------+  RIGHT      Compressible Phasicity Spontaneous Properties       Summary         +----------+------------+---------+-----------+----------+--------------------+  IJV            Full        Yes        Yes                                      +----------+------------+---------+-----------+----------+--------------------+  Subclavian     Full        Yes        Yes                                      +----------+------------+---------+-----------+----------+--------------------+  Axillary       Full        Yes        Yes                                      +----------+------------+---------+-----------+----------+--------------------+  Brachial       None                   No                  Acute surrounding                                                                      PICC          +----------+------------+---------+-----------+----------+--------------------+  Radial         Full                                                            +----------+------------+---------+-----------+----------+--------------------+  Ulnar          Full                                                            +----------+------------+---------+-----------+----------+--------------------+  Cephalic       Full                                                            +----------+------------+---------+-----------+----------+--------------------+  Basilic                                                     Not visualized     +----------+------------+---------+-----------+----------+--------------------+  Left Findings:  +----------+------------+---------+-----------+----------+-------+  LEFT       Compressible Phasicity Spontaneous Properties Summary  +----------+------------+---------+-----------+----------+-------+  IJV            Full        Yes        Yes                         +----------+------------+---------+-----------+----------+-------+  Subclavian     Full        Yes        Yes                         +----------+------------+---------+-----------+----------+-------+  Axillary       Full        Yes        Yes                         +----------+------------+---------+-----------+----------+-------+  Brachial       Full        Yes        Yes                         +----------+------------+---------+-----------+----------+-------+  Radial         Full                                               +----------+------------+---------+-----------+----------+-------+  Ulnar          Full                                               +----------+------------+---------+-----------+----------+-------+  Cephalic       None                                       Acute   +----------+------------+---------+-----------+----------+-------+  Basilic        Full                                               +----------+------------+---------+-----------+----------+-------+  Summary:  Right: Findings consistent with acute deep vein thrombosis involving the right brachial veins surrounding the PICC line.  Left: No  evidence of deep vein thrombosis in the upper extremity. Findings consistent with acute superficial vein thrombosis involving the left cephalic vein.  *See table(s) above for measurements and observations.  Diagnosing physician: Deitra Mayo MD Electronically signed by Deitra Mayo MD on 11/21/2021 at 6:17:06 PM.    Final    [place] EKG SITE RITE  Result Date: 11/20/2021 If [facility] image not attached, placement could not be confirmed due to current cardiac rhythm.   Medications: Scheduled:  amLODipine  10 mg Per  Tube Daily   chlorhexidine gluconate (MEDLINE KIT)  15 mL Mouth Rinse BID   Chlorhexidine Gluconate Cloth  6 each Topical Daily   docusate  100 mg Per Tube BID   enoxaparin (LOVENOX) injection  80 mg Subcutaneous Q12H   insulin aspart  0-15 Units Subcutaneous Q4H   insulin aspart  8 Units Subcutaneous Q4H   insulin glargine-yfgn  20 Units Subcutaneous BID   mouth rinse  15 mL Mouth Rinse 10 times per day   metoprolol tartrate  25 mg Per Tube BID   pantoprazole sodium  40 mg Per Tube Daily   polyethylene glycol  17 g Per Tube Daily   Continuous:  sodium chloride Stopped (11/21/21 1033)   dexmedetomidine (PRECEDEX) IV infusion Stopped (11/20/21 0542)   feeding supplement (VITAL AF 1.2 CAL) 1,000 mL (11/22/21 0015)   fentaNYL infusion INTRAVENOUS 180 mcg/hr (11/22/21 0600)   lacosamide (VIMPAT) IV Stopped (11/21/21 2208)   levETIRAcetam Stopped (11/21/21 2227)   linezolid (ZYVOX) IV Stopped (11/21/21 2239)   meropenem (MERREM) IV Stopped (11/22/21 0409)   sodium phosphate  Dextrose 5% IVPB 30 mmol (11/22/21 0654)   Assessment: 56 year old woman with a history of cervical cancer, status post nephrectomy 2 months ago, colostomy, chronic occlusion of the left common carotid artery, CAD, diabetes, depression, hypertension, remote history of stroke without residual deficits who was brought in by EMS for altered mental status. She had had a flulike illness for a week PTA and was found confused on 2/19. The patient had acute PEA arrest while in the ED on Sunday (2/19) with CPR time of 2 minutes. Diagnosed with acute respiratory failure with hypoxemia and was intubated at that time. MRI brain performed on Tuesday (2/21)revealed numerous (> 50) widely distributed lesions in the cerebral hemispheres as well as the cerebellum.  - Exam 3/1 reveals patient with minimal movement to noxious stimuli of BUE without movement to BLE. She does have equal and briskly reactive pupils bilaterally. She remains  intubated and sedated. - EEG:  - LTM EEG,  11/17/2021 1127 - 11/18/2021 0730, to assess for seizure activity after CCM noticed some twitching in arm: findings consistent with moderate to severe diffuse encephalopathy, nonspecific to etiology without seizures or definite epileptiform discharges.  - EEG overnight 2/27 - 2/28: evidence of epileptogenicity with generalized onset, more prominent when awake and stimulated with moderate to severe encephalopathy. Event button pushed for generalized twitching and eyes rolling back with GPDs with significant movement artifact.  - MRI brain with and without contrast (2/21): Numerous (> 50) small, round DWI+ lesions are scattered throughout the brain parenchyma, infratentorially and supratentorially, some with ring-like morphologies. On FLAIR images, several of the lesions exhibit adjacent vasogenic edema. Some of the lesions exhibit corresponding T1-hypointensity. A small subset of the lesions exhibit enhancement on the post-contrast images. Many lesions show evidence of mild associated hemorrhage. Restricted diffusion in the left lateral ventricle likely due to ventriculitis. Overall appearance is most consistent with multiple cerebral abscesses, particularly  given the white count of 27. DDx for underlying pathogen includes bacterial (including tuberculosis), fungal (histoplasmosis, aspergillosis), protozoan (toxoplasmosis), parasitic (neurocysticercosis). Lower on the DDx would be an atypical presentation of CNS inflammatory disease such as MS or CNS lupus, or metastatic disease.   - Repeat CT of brain (2/26):Relatively well-defined focus of hypoattenuation in the superior,medial right frontal lobe and adjacent right parietal lobe,consistent with recent infarction, occurring since the brain MRI dated 11/14/2021.Several small hypoattenuating lesions noted in the basal ganglia on the left, cerebral hemispheres and cerebellar hemispheres, corresponding to the lesions noted  on the prior brain MRI.Differential diagnosis consistent with abscesses or metastatic disease. Old left superior frontal lobe infarct stable from the prior head CT. - WBC 22.4 > 17.6 > 16.3 > 14.2 > 13.7 - CSF from LP showed: cell count markedly abnormal with WBC 585 of which 84% were segmented neutrophils. Glucose 21 (low), Total protein 191 (high). Consistent with bacterial meningitis. - Cryptococcal antigen negative, Acid Fast Smear negative, remainder of infectious labs In process Bacterial culture with no growth x 3 days. AFB culture pending. CSF fungal culture with no fungus isolated after 4 days. Initial gram stain on fresh CSF was negative except for WBCs.  - Quantiferon Gold TB test: not completed due to insufficient plasma yield. - Liver MRI recommended by ID: No worrisome lesions within the liver. Persistent region of hypoperfusion to the spleen suggests splenic infarctions.   Recommendations: - Empiric ABX for cerebral abscesses. On linezolid, meropenem, and Flagyl. Per ID, linezolid to cover MRSA pneumonia, Listeria, and Nocardia as well as VRE coverage. Recent change from cefepime to meropenem to decrease the risk of lowering the seizure threshold.  - Follow up on remaining CSF studies including VDRL, fungal stain (no growth 6 days), final fungal and bacterial cultures pending.  - TEE has been ordered and is pending. To be performed due to concerns of possible SBE - Continue Keppra at 1500 mg BID - Wean sedation as tolerated - At this time, prognosis is indeterminate and patient will need ongoing assessment of mental status going forward - LTM EEG read- pending, will discontinue if negative for seizures - Discussed with provider at bedside   LOS: 10 days  Rikki Spearing, Neuro NP 11/22/2021  8:48 AM  NEUROHOSPITALIST ADDENDUM Performed a face to face diagnostic evaluation.   I have reviewed the contents of history and physical exam as documented by PA/ARNP/Resident and agree  with above documentation.  I have discussed and formulated the above plan as documented. Edits to the note have been made as needed.  Impression/Key exam findings/Plan: EEG difficult to interpret but notable for intermittent rhythmic delta activity in left frontal region between 1230 and 1410. The pattern is on the ictal interictal continuum with low potential for seizures.  Clinically, no seizure activity was noted yesterday. At this time, will take her off cEEG and keep her on keppra 1547m BIG and vimpat 1055mBID. Antibiotics per ID.  I also spoke to her son yesterday and discussed obtunded mentation and significant brain injury in the setting of cerebral abscesses, brief cardiac arrest and strokes. She has intact brainstem reflexes but I do not see any obvious signs of higher cerebral function. However, this is confounded by her being on high dose fentanyl gtt which she requires for vent dyssynchrony. At this time, her prognosis is indeterminate. Treatment of her abscesses, weaning off sedating medications and time will tell how she is going to do.  At this time, we will  signoff. Would give her a couple weeks and if there is no significant change in her mentation please call us back. Would also recommend having a low threshold to get [place] involved if there is any clinical seizure activity noted.  This patient is critically ill and at significant risk of neurological worsening, death and care requires constant monitoring of vital signs, hemodynamics,respiratory and cardiac monitoring, neurological assessment, discussion with family, other specialists and medical decision making of high complexity. I spent 35 minutes of neurocritical care time  in the care of  this patient. This was time spent independent of any time provided by nurse practitioner or PA.  Donnetta Simpers [facility] Pager Number [phone] 11/22/2021  12:07 PM  Donnetta Simpers, MD [facility] [phone]   If 7pm to 7am, please call on call as listed on AMION.

## 2021-11-22 NOTE — Progress Notes (Signed)
[facility]ious [facility]   Reason for visit: follow up on brain abscess  Interval History: remains afebrile.  WBC 13.7.   Day 11 total antibiotics Day 3 meropenem Day 9 linezolid  Physical Exam: Constitutional:  Vitals:   11/22/21 0802 11/22/21 0830  BP:    Pulse:    Resp:    Temp:  100 F (37.8 C)  SpO2: 92%   Patient does not respond Eyes: upward gaze, fixed HENT: +ET Respiratory: respiratory effort on vent  Review of Systems: Unable to be assessed due to patient factors  Lab Results  Component Value Date   WBC 13.7 (H) 11/22/2021   HGB 9.1 (L) 11/22/2021   HCT 31.5 (L) 11/22/2021   MCV 94.9 11/22/2021   PLT 219 11/22/2021    Lab Results  Component Value Date   CREATININE 0.79 11/22/2021   BUN 41 (H) 11/22/2021   NA 142 11/22/2021   K 4.0 11/22/2021   CL 104 11/22/2021   CO2 29 11/22/2021    Lab Results  Component Value Date   ALT 25 11/16/2021   AST 19 11/16/2021   ALKPHOS 202 (H) 11/16/2021     Microbiology: Recent Results (from the past 240 hour(s))  Urine Culture     Status: Abnormal   Collection Time: 11/12/21  3:36 PM   Specimen: In/Out Cath Urine  Result Value Ref Range Status   Specimen Description IN/OUT CATH URINE  Final   Special Requests   Final    NONE Performed at [facility], [address], [place], [place] [address]    Culture (A)  Final    1,000 COLONIES/mL ENTEROBACTER CLOACAE 3,000 COLONIES/mL PSEUDOMONAS AERUGINOSA    Report Status 11/15/2021 FINAL  Final   Organism ID, Bacteria ENTEROBACTER CLOACAE (A)  Final   Organism ID, Bacteria PSEUDOMONAS AERUGINOSA (A)  Final      Susceptibility   Enterobacter cloacae - MIC*    CEFAZOLIN >=64 RESISTANT Resistant     CEFEPIME <=0.12 SENSITIVE Sensitive     CIPROFLOXACIN <=0.25 SENSITIVE Sensitive     GENTAMICIN <=1 SENSITIVE Sensitive     IMIPENEM <=0.25 SENSITIVE Sensitive     NITROFURANTOIN 32 SENSITIVE Sensitive     TRIMETH/SULFA <=20 SENSITIVE  Sensitive     PIP/TAZO <=4 SENSITIVE Sensitive     * 1,000 COLONIES/mL ENTEROBACTER CLOACAE   Pseudomonas aeruginosa - MIC*    CEFTAZIDIME 4 SENSITIVE Sensitive     CIPROFLOXACIN <=0.25 SENSITIVE Sensitive     GENTAMICIN <=1 SENSITIVE Sensitive     IMIPENEM 2 SENSITIVE Sensitive     PIP/TAZO 8 SENSITIVE Sensitive     CEFEPIME 2 SENSITIVE Sensitive     * 3,000 COLONIES/mL PSEUDOMONAS AERUGINOSA  Respiratory (~20 pathogens) panel by PCR     Status: None   Collection Time: 11/12/21  3:37 PM   Specimen: Nasopharyngeal Swab; Respiratory  Result Value Ref Range Status   Adenovirus NOT DETECTED NOT DETECTED Final   Coronavirus 229E NOT DETECTED NOT DETECTED Final    Comment: (NOTE) The Coronavirus on the Respiratory Panel, DOES NOT test for the novel  Coronavirus (2019 nCoV)    Coronavirus HKU1 NOT DETECTED NOT DETECTED Final   Coronavirus NL63 NOT DETECTED NOT DETECTED Final   Coronavirus OC43 NOT DETECTED NOT DETECTED Final   Metapneumovirus NOT DETECTED NOT DETECTED Final   Rhinovirus / Enterovirus NOT DETECTED NOT DETECTED Final   Influenza A NOT DETECTED NOT DETECTED Final   Influenza B NOT DETECTED  NOT DETECTED Final   Parainfluenza Virus 1 NOT DETECTED NOT DETECTED Final   Parainfluenza Virus 2 NOT DETECTED NOT DETECTED Final   Parainfluenza Virus 3 NOT DETECTED NOT DETECTED Final   Parainfluenza Virus 4 NOT DETECTED NOT DETECTED Final   Respiratory Syncytial Virus NOT DETECTED NOT DETECTED Final   Bordetella pertussis NOT DETECTED NOT DETECTED Final   Bordetella Parapertussis NOT DETECTED NOT DETECTED Final   Chlamydophila pneumoniae NOT DETECTED NOT DETECTED Final   Mycoplasma pneumoniae NOT DETECTED NOT DETECTED Final    Comment: Performed at [facility], [place] [address], [place], [place] [address]  Blood Culture (routine x 2)     Status: None   Collection Time: 11/12/21  4:23 PM   Specimen: Site Not Specified; Blood  Result Value Ref Range Status   Specimen  Description SITE NOT SPECIFIED  Final   Special Requests   Final    BOTTLES DRAWN AEROBIC AND ANAEROBIC Blood Culture adequate volume   Culture   Final    NO GROWTH 5 DAYS Performed at [facility], [place] [address], [place], [place] [address]    Report Status 11/17/2021 FINAL  Final  Resp Panel by RT-PCR (Flu A&B, Covid) Nasopharyngeal Swab     Status: None   Collection Time: 11/12/21  4:25 PM   Specimen: Nasopharyngeal Swab; Nasopharyngeal(NP) swabs in vial transport medium  Result Value Ref Range Status   SARS Coronavirus 2 by RT PCR NEGATIVE NEGATIVE Final    Comment: (NOTE) SARS-CoV-2 target nucleic acids are NOT DETECTED.  The SARS-CoV-2 RNA is generally detectable in upper respiratory specimens during the acute phase of infection. The lowest concentration of SARS-CoV-2 viral copies this assay can detect is 138 copies/mL. A negative result does not preclude SARS-Cov-2 infection and should not be used as the sole basis for treatment or other patient management decisions. A negative result may occur with  improper specimen collection/handling, submission of specimen other than nasopharyngeal swab, presence of viral mutation(s) within the areas targeted by this assay, and inadequate number of viral copies(<138 copies/mL). A negative result must be combined with clinical observations, patient history, and epidemiological information. The expected result is Negative.  Fact Sheet for Patients:  EntrepreneurPulse.com.au  Fact Sheet for Healthcare Providers:  IncredibleEmployment.be  This test is no t yet approved or cleared by the [place] FDA and  has been authorized for detection and/or diagnosis of SARS-CoV-2 by FDA under an Emergency Use Authorization (EUA). This EUA will remain  in effect (meaning this test can be used) for the duration of the COVID-19 declaration under Section 564(b)(1) of the Act, 21 U.S.C.section  360bbb-3(b)(1), unless the authorization is terminated  or revoked sooner.       Influenza A by PCR NEGATIVE NEGATIVE Final   Influenza B by PCR NEGATIVE NEGATIVE Final    Comment: (NOTE) The Xpert Xpress SARS-CoV-2/FLU/RSV plus assay is intended as an aid in the diagnosis of influenza from Nasopharyngeal swab specimens and should not be used as a sole basis for treatment. Nasal washings and aspirates are unacceptable for Xpert Xpress SARS-CoV-2/FLU/RSV testing.  Fact Sheet for Patients: EntrepreneurPulse.com.au  Fact Sheet for Healthcare Providers: IncredibleEmployment.be  This test is not yet approved or cleared by the [place] FDA and has been authorized for detection and/or diagnosis of SARS-CoV-2 by FDA under an Emergency Use Authorization (EUA). This EUA will remain in effect (meaning this test can be used) for the duration of the COVID-19 declaration under Section 564(b)(1)  of the Act, 21 U.S.C. section 360bbb-3(b)(1), unless the authorization is terminated or revoked.  Performed at [facility], [place] [address], [place], [place] [address]   Blood Culture (routine x 2)     Status: None   Collection Time: 11/12/21  4:44 PM   Specimen: Site Not Specified; Blood  Result Value Ref Range Status   Specimen Description SITE NOT SPECIFIED  Final   Special Requests   Final    BOTTLES DRAWN AEROBIC AND ANAEROBIC Blood Culture results may not be optimal due to an excessive volume of blood received in culture bottles   Culture   Final    NO GROWTH 5 DAYS Performed at [facility], [place] [address] [place], [place] [address]    Report Status 11/17/2021 FINAL  Final  MRSA Next Gen by PCR, Nasal     Status: Abnormal   Collection Time: 11/12/21  7:36 PM   Specimen: Nasal Mucosa; Nasal Swab  Result Value Ref Range Status   MRSA by PCR Next Gen DETECTED (A) NOT DETECTED Final    Comment: RESULT CALLED TO, READ BACK BY AND VERIFIED  WITH: V NGUYEN,RN_0  11/12/21 [place] (NOTE) The GeneXpert MRSA Assay (FDA approved for NASAL specimens only), is one component of a comprehensive MRSA colonization surveillance program. It is not intended to diagnose MRSA infection nor to guide or monitor treatment for MRSA infections. Test performance is not FDA approved in patients less than 48 years old. Performed at [facility], [place] [address] [place], [place] [address]   Culture, Respiratory w Gram Stain     Status: None   Collection Time: 11/13/21  2:09 AM   Specimen: Bronchoalveolar Lavage; Respiratory  Result Value Ref Range Status   Specimen Description BRONCHIAL ALVEOLAR LAVAGE  Final   Special Requests NONE  Final   Gram Stain   Final    MODERATE WBC PRESENT, PREDOMINANTLY PMN FEW GRAM POSITIVE COCCI IN PAIRS AND CHAINS Performed at [facility], [address], [place], [place] [address]    Culture FEW METHICILLIN RESISTANT STAPHYLOCOCCUS AUREUS  Final   Report Status 11/15/2021 FINAL  Final   Organism ID, Bacteria METHICILLIN RESISTANT STAPHYLOCOCCUS AUREUS  Final      Susceptibility   Methicillin resistant staphylococcus aureus - MIC*    CIPROFLOXACIN >=8 RESISTANT Resistant     ERYTHROMYCIN >=8 RESISTANT Resistant     GENTAMICIN <=0.5 SENSITIVE Sensitive     OXACILLIN >=4 RESISTANT Resistant     TETRACYCLINE <=1 SENSITIVE Sensitive     VANCOMYCIN 1 SENSITIVE Sensitive     TRIMETH/SULFA <=10 SENSITIVE Sensitive     CLINDAMYCIN <=0.25 SENSITIVE Sensitive     RIFAMPIN <=0.5 SENSITIVE Sensitive     Inducible Clindamycin NEGATIVE Sensitive     * FEW METHICILLIN RESISTANT STAPHYLOCOCCUS AUREUS  CSF culture w Stat Gram Stain     Status: None   Collection Time: 11/15/21  8:02 AM   Specimen: CSF; Cerebrospinal Fluid  Result Value Ref Range Status   Specimen Description CSF  Final   Special Requests NONE  Final   Gram Stain   Final    WBC PRESENT,BOTH PMN AND MONONUCLEAR NO ORGANISMS SEEN CYTOSPIN  SMEAR    Culture   Final    NO GROWTH Performed at [facility], [place] [address] [place], [place] [address]    Report Status 11/18/2021 FINAL  Final  Toxoplasma Gondii, PCR     Status: None   Collection  Time: 11/15/21  8:20 AM   Specimen: Cerebrospinal Fluid  Result Value Ref Range Status   Toxoplasma Gondii, PCR Negative Negative Final    Comment: (NOTE) No Toxoplasma gondii DNA detected. This test was developed and its performance characteristics determined by [employer]. It has not been cleared or approved by the U.S. Food and Drug Administration. The FDA has determined that such clearance or approval is not necessary. This test is used for clinical purposes. It should not be regarded as investigational or research. Performed At: [facility] [place], [place] [id number] Rush Farmer MD YT:[id number]   Acid Fast Smear (AFB)     Status: None   Collection Time: 11/15/21  9:23 AM   Specimen: CSF; Cerebrospinal Fluid  Result Value Ref Range Status   AFB Specimen Processing Direct Inoculation  Final   Acid Fast Smear Negative  Final    Comment: (NOTE) Performed At: [facility] [place], [place] [id number] Rush Farmer MD BS:[id number]    Source (AFB) CSF  Final    Comment: Performed at [facility], [place] [address], [place], [place] [address]  Culture, fungus without smear     Status: None (Preliminary result)   Collection Time: 11/15/21  9:24 AM   Specimen: CSF; Other  Result Value Ref Range Status   Specimen Description CSF  Final   Special Requests Normal  Final   Culture   Final    NO FUNGUS ISOLATED AFTER 7 DAYS Performed at [facility], [place] [address] [place],  [address]    Report Status PENDING  Incomplete    Impression/Plan:  1. Brain abscess - multiple areas on MRI and unclear etiology.  On broad coverage though may be MRSA emboli from endocarditis if she had a PFO or  multiple valves. Only benzodiazepines on drug screen.   Will continue with linezolid and meropenem.   2.  Pulmonary opacities - no new growth and on linezolid and will continue.    3.  Infection prevention - on contact for MRSA and droplet.  No clear indication for droplet isolation so will d/c this.

## 2021-11-22 NOTE — Procedures (Signed)
Patient Name: Kristen Murphy  ?MRN: [id number]  ?Epilepsy Attending: Lora Havens  ?Referring Physician/Provider: Donnetta Simpers, MD ?Duration: 11/21/2021 3825 to 11/22/2021 0539 ?  ?Patient history: 56 year old woman with a history of cervical cancer, status post nephrectomy 2 months ago, colostomy, CAD, diabetes, depression, hypertension, remote history of stroke without residual deficits who was brought in by EMS for altered mental status.  EEG to evaluate for seizure. ?  ?Level of alertness: lethargic ?  ?AEDs during EEG study: LEV, Vimpat ?  ?Technical aspects: This EEG study was done with scalp electrodes positioned according to the 10-20 International system of electrode placement. Electrical activity was acquired at a sampling rate of 500Hz  and reviewed with a high frequency filter of 70Hz  and a low frequency filter of 1Hz . EEG data were recorded continuously and digitally stored.  ?  ?Description: EEG showed continuous generalized 3 to 6 Hz theta-delta slowing which at times appears sharply contoured with triphasic morphology. EEG also showed intermittent rhythmic delta activity without definite evolution in left frontal region between 1230 to 1410 on 11/21/2021.  Hyperventilation and photic stimulation were not performed.    ?  ?Patient event button was pressed on 11/21/2021 at 1105  during which patient's eyes were rolled back. Concomitant EEG before, during and after the event did not show any EEG change.  However, focal motor seizures may not be seen on scalp EEG. ? ?ABNORMALITY ?- Intermittent rhythmic delta activity, left frontal region ( FIRDA) ?- Continuous slow, generalized  ?  ?IMPRESSION: ?This study showed intermittent rhythmic delta activity in left frontal region between 1230 to 1410 on 11/21/2021 which is suggestive of cortical dysfunction in left frontal region. This EEG pattern is also on the ictal-interictal continuum with low potential for seizures. Additionally there is moderate to  severe diffuse encephalopathy, nonspecific etiology.  ?  ?Event button was pressed on 11/20/2021 at 1105  during which patient's eyes were rolled back without concomitant EEG change.  However, focal motor seizures may not be seen on scalp EEG. Therefore clinical correlation is recommended. ?  ?Lora Havens  ?

## 2021-11-22 NOTE — Progress Notes (Signed)
EEG maintenance performed.  No skin breakdown observed at electrode sites Fp1, Fp2. 

## 2021-11-22 NOTE — Progress Notes (Signed)
? ?NAME:  BRANDON SCARBROUGH, MRN:  [id number], DOB:  [birth year], LOS: 10 ?ADMISSION DATE:  11/12/2021, CONSULTATION DATE: 11/12/2021 ?REFERRING MD: Dr. Langston Masker, CHIEF COMPLAINT: PEA arrest ? ?History of Present Illness:  ?56 year old woman with a history of tobacco use, cervical cancer ([facility]), post nephrectomy and colostomy, CAD with LAD stent 01/2019, diabetes, depression, hypertension, remote CVA without known deficits.  Also with a history of substance abuse.  Brought in by her son to the ED 2/19 with fever, altered mental status and slurred speech. ? ?Apparently she had a flulike illness for a week, was found confused on 2/19.  Some suspicion by son that she may have been taking more prescription narcotics, also taking many NSAIDs per his report.  She was disoriented in the ED, febrile 103F, hyperglycemic. ?In the ED she apparently experienced acute hypoxemia, had some blood from her mouth, question emesis versus hemoptysis.  Devolved to PEA and required emergent ET intubation and CPR.  ROSC after 2 minutes.  OG tube placed with bright red blood obtained.  Chest x-ray with mild right hilar prominence, more notable on postintubation film.  No infiltrates or effusions ?She has received empiric antibiotics, is receiving 1 unit PRBC, is about to start Protonix infusion. ?Hemodynamically stabilized, mechanically ventilated. ? ?She remains critically ill. ? ?Pertinent  Medical History  ? ?Past Medical History:  ?Diagnosis Date  ? Anemia   ? Anxiety   ? Arthritis   ? Asthma   ? Cervical cancer ([place])   ? Chronic kidney disease   ? Coronary artery disease   ? Depression   ? Diabetes mellitus without complication ([place])   ? Dyspnea   ? Headache   ? Heart murmur   ? Hx of adenomatous polyp of colon   ? Hypertension   ? Rectovaginal fistula   ? Seizures ([place])   ? Stercoral ulcer of rectum 12/2020  ? [facility]roke [facility])   ? ? ?Significant Hospital Events: ?Including procedures, antibiotic start and stop dates in addition to other pertinent  events   ?2/19 Intubated. Head CT  > no acute CT findings, old left basal ganglia lacunar, old left frontal cortical and subcortical infarct ?2/24 MR Brain - Numerous lesions with mild associated hemorrhage. Mild edema. Concerned for brain abscesses ?2/22 LP consistent with bacterial meningitis. Respiratory Cx +MRSA ?2/25 MR Liver with no lesions, hypoperfusion to the subcapsular right hepatic lobe, favor benign vascular phenomena, distended gallbladder, persistent region of hypoperfusion to the  ?2/28 No acute events overnight, LTM read ? Seizure.  Cefepime switched to meropenem. ? ?Interim History / Subjective:  ?Tmax 99.3 ?+7.5L admit, +2.9L past 24 hours, 1 L uop, 400 stool ? ?180 fentanyl, sedated on vent ? ?Unable to obtain subjective evaluation due to patient status ? ?Objective   ?Blood pressure (!) 152/60, pulse 70, temperature 99.2 ?F (37.3 ?C), temperature source Oral, resp. rate (!) 23, height 5\' 2"  (1.575 m), weight 78.4 kg, SpO2 92 %. ?   ?Vent Mode: PSV;CPAP ?FiO2 (%):  [40 %-45 %] 45 % ?Set Rate:  [28 bmp] 28 bmp ?Vt Set:  [400 mL] 400 mL ?PEEP:  [8 cmH20-10 cmH20] 8 cmH20 ?Pressure Support:  [8 cmH20] 8 cmH20 ?Plateau Pressure:  [18 cmH20-22 cmH20] 18 cmH20  ? ?Intake/Output Summary (Last 24 hours) at 11/22/2021 0828 ?Last data filed at 11/22/2021 0600 ?Gross per 24 hour  ?Intake 3377.91 ml  ?Output 1410 ml  ?Net 1967.91 ml  ? ?Filed Weights  ? 11/20/21 0343 11/21/21 0500 11/22/21 0349  ?  Weight: 74.1 kg 78.6 kg 78.4 kg  ? ? ?Physical Exam: ?General: In bed, ill appearing, sedated on vent ?HEENT: MM pink/moist, anicteric, atraumatic ?Neuro: Right gaze deviation, pupils equal and reactive to light, flexing right upper extremity, intact corneal cough and gag, on fentanyl drip ?CV: S1S2, NSR, no m/r/g appreciated ?PULM: Air movement in all lobes, trachea midline, chest expansion symmetric ?GI: soft, bsx4 active, non-tender   ?Extremities: warm/dry, generalized edema, capillary refill less than 3 seconds   ?Skin:  no rashes or lesions noted ? ?Pertinent labs: ?BUN 38>41, creat 0.79 ?Phos 1.8 ?Wbc 12.1>10.9>13.7 ?HGB 9.3>9.1 ?Bg  207- 293 ? ?Resolved Hospital Problem list   ?Hyponatremia ?Hypokalemia ?Hypocalcemia ? ?Assessment & Plan:  ? ?Multiple brain abscesses ?Bacterial meningitis ?Acute metabolic encephalopathy secondary to above ?-Repeat CT head 2/19 neg for acute findings.   ?-EEG and LTM 2/25 neg for seizures.  Consider also substances as son concerned about possible narcotic use.  UDS only positive for benzo.  ?-MRI brain 2/21 numerous lesions with mild associated hemorrhage and edema. Neurosurgery reviewed imaging and patient not a good candidate for lesion aspiration. ?-Repeat head CT 2/27 with no acute changes  ?P: ?-Neuro and ID following, appreciate assistance ?-EEG per neuro. ?-Continue meropenem and linezolid.  Narrow as cultures finalize ?-TEE pending.  Plan to get TEE once off EEG.  ?-Continue droplet and contact precautions ?- We will work on decreasing fentanyl requirements if possible ?-Quant gold unable to be analyzed.  ID notified.  No need to redraw. ?-CSF cultures pending, no growth to date, CSF IgG elevated at 22.5 ?-Seizure precautions, as needed Versed for seizure ?-Continue Keppra and Vimpat ? ?Acute respiratory failure with hypoxemia secondary to necrotizing MRSA pneumonia ?Difficulty weaning from vent ?P: ?-LTVV strategy with tidal volumes of 4-8 cc/kg ideal body weight ?-Goal plateau pressures less than 30 and driving pressures less than 15 ?-Wean PEEP/FiO2 for SpO2 92-98% ?-VAP bundle ?-Daily SAT and SBT ?-PAD bundle with fentanyl gtt and precedex ?-RASS goal 0 to -1 ?-Follow intermittent CXR and ABG PRN ?-Continue broad-spectrum antibiotics as above ? ?Septic shock secondary necrotizing MRSA pneumonia, bacterial meningitis- improving   ?-Also urine culture polymicrobial with 1k Enterobacter and 3K Pseudomonas. Off levophed. Wbc 12.1>10.9>13.7 ?Acute cardiopulmonary arrest, PEA  arrest ?Hypertension ?History of CAD ?DVT from PICC line ?Remains off vasopressors ?P: ?-ID following appreciate assistance ?-TEE as above ?-Continue amlodipine 10 daily, metoprolol 25 twice daily, Continue ASA ?-Continue Lovenox twice daily.  Will require 3 months for provoked thrombosis. ?-Continuous telemetry ?-Will consider gentle diuresis ? ?Acute blood loss/chronic anemia  ?HGB 9.3>9.1, no signs of active bleeding ?History of NSAID abuse ?-EGD blood clots in gastric fundus/body, erosive gastropathy, no stigmata of bleeding but OG tube trauma in body ?P: ?-Transfuse PRBC if HBG less than 7 ?-Obtain AM CBC to trend H&H ?-Monitor for signs of bleeding ? ?Diabetes with hyperglycemia ?Bg  207- 293 ?P: ?-Blood Glucose goal 140-180. ?-SSI ?-Continue insulin glargine 20 units twice daily, blood sugar trending down, will monitor for increase ?-Home Metformin, and Farxiga on hold  ? ?Hypophosphatemia ?-Replete ?-Follow-up in a.m. labs ? ? ? ?Best Practice (right click and "Reselect all SmartList Selections" daily)  ? ?Diet/type: tubefeeds ?DVT prophylaxis: prophylactic heparin  ?GI prophylaxis: PPI ?Lines: N/A > PICC ?Foley:  N/A ?Code Status:  full code ?Last date of multidisciplinary goals of care discussion: pending ? ?Critical care time: 37 minutes  ? ? ?Redmond School., MSN, APRN, AGACNP-BC ?[facility]  ?  11/22/2021 , 8:29 AM ? ?Please see [email or web address] for pager details ? ?If no response, please call [phone] ?After hours, please call Elink at [phone] ? ? ? ?

## 2021-11-22 NOTE — Progress Notes (Signed)
Pharmacy Antibiotic Note ? ?Kristen Murphy is a 56 y.o. female admitted on 11/12/2021 with septic shock, necrotizing MRSA PNA, bacterial meningitis, multiple brain abcesses.  Pharmacy has been consulted for Meropenem and Zyvox dosing.  ? ?Afebrile, WBC slightly up 10.9 > 13.7 ? ?Plan: ?- Continue Meropenem 2g IV every 8 hours (CNS dosing) ?- Continue Zyvox 600 mg IV every 12 hours ?- Will continue to follow renal function, culture results, LOT, and antibiotic de-escalation plans  ? ?Height: _0  (157.5 cm) ?Weight: 78.4 kg (172 lb 13.5 oz) ?IBW/kg (Calculated) : 50.1 ? ?Temp (24hrs), Avg:99.2 ?F (37.3 ?C), Min:98.7 ?F (37.1 ?C), Max:100 ?F (37.8 ?C) ? ?Recent Labs  ?Lab 11/17/21 ?9458 11/18/21 ?0459 11/19/21 ?0328 11/19/21 ?2010 11/20/21 ?0353 11/21/21 ?0346 11/22/21 ?0139  ?WBC  --  16.3* 14.2*  --  12.1* 10.9* 13.7*  ?CREATININE  --  0.94 0.90 0.91 0.88 0.81 0.79  ?LATICACIDVEN 1.1  --   --   --   --   --   --   ? ?  ?Estimated Creatinine Clearance: 76.1 mL/min (by C-G formula based on SCr of 0.79 mg/dL).   ? ?Allergies  ?Allergen Reactions  ? Glimepiride Anaphylaxis  ?  Has sulfa in it per pharmacy  ? Sulfa Antibiotics Anaphylaxis and Hives  ? Effexor [Venlafaxine] Hives  ? ? ?Antimicrobials this admission: ?Rocephin 2/19 >> 2/21 ?Acyclovir 2/19 >> 2/21 ?Vanc 2/19 >> 2/22 ?Cefepime 2/21 >> 2/27 ?Flagyl 2/21 >>2/27 ? ?Linezolid 2/22 >  ?Meropenem 2/27 >> ? ?Dose adjustments this admission: ? ?Microbiology results: ?2/22 Fungus Cx - ng x7d ?2/22 CSF - ngtd ?2/19 BCx: neg ?2/19 Ucx pseudomonas pan sens, enterobacter cefaz -R, S -to all others ?2/20 BAL > few MRSA ?2/19 MRSA PCR > detected ? ?Thank you for allowing pharmacy to be a part of this patient?s care. ? ?Laurey Arrow, PharmD ?PGY1 Pharmacy Resident ?11/22/2021  12:08 PM ? ?Please check [email or web address] for unit-specific pharmacy phone numbers. ? ? ? ?

## 2021-11-22 NOTE — Progress Notes (Signed)
LTM EEG discontinued - no skin breakdown at unhook.   

## 2021-11-22 NOTE — Progress Notes (Signed)
[facility] ADULT ICU REPLACEMENT PROTOCOL ? ? ?The patient does apply for the [facility] Adult ICU Electrolyte Replacment Protocol based on the criteria listed below:  ? ?1.Exclusion criteria: TCTS patients, ECMO patients, and Dialysis patients ?2. Is GFR >/= 30 ml/min? Yes.    ?Patient's GFR today is >60 ?3. Is SCr </= 2? Yes.   ?Patient's SCr is 0.79 mg/dL ?4. Did SCr increase >/= 0.5 in 24 hours? No. ?5.Pt's weight >40kg  Yes.   ?6. Abnormal electrolyte(s):   Phos 1.8  ?7. Electrolytes replaced per protocol ?8.  Call MD STAT for K+ </= 2.5, Phos </= 1, or Mag </= 1 ?Physician:  R. Nallamothu ? ?Kristen Murphy 11/22/2021 6:12 AM ? ?

## 2021-11-22 NOTE — Plan of Care (Signed)

## 2021-11-23 ENCOUNTER — Inpatient Hospital Stay (HOSPITAL_COMMUNITY): Payer: Medicaid Other

## 2021-11-23 DIAGNOSIS — I469 Cardiac arrest, cause unspecified: Secondary | ICD-10-CM | POA: Diagnosis not present

## 2021-11-23 DIAGNOSIS — G9340 Encephalopathy, unspecified: Secondary | ICD-10-CM | POA: Diagnosis not present

## 2021-11-23 DIAGNOSIS — I480 Paroxysmal atrial fibrillation: Secondary | ICD-10-CM

## 2021-11-23 DIAGNOSIS — G060 Intracranial abscess and granuloma: Secondary | ICD-10-CM | POA: Diagnosis not present

## 2021-11-23 DIAGNOSIS — Z9911 Dependence on respirator [ventilator] status: Secondary | ICD-10-CM | POA: Diagnosis not present

## 2021-11-23 DIAGNOSIS — I340 Nonrheumatic mitral (valve) insufficiency: Secondary | ICD-10-CM

## 2021-11-23 LAB — CBC
HCT: 33.9 % — ABNORMAL LOW (ref 36.0–46.0)
Hemoglobin: 10.2 g/dL — ABNORMAL LOW (ref 12.0–15.0)
MCH: 27.3 pg (ref 26.0–34.0)
MCHC: 30.1 g/dL (ref 30.0–36.0)
MCV: 90.9 fL (ref 80.0–100.0)
Platelets: 271 10*3/uL (ref 150–400)
RBC: 3.73 MIL/uL — ABNORMAL LOW (ref 3.87–5.11)
RDW: 18.7 % — ABNORMAL HIGH (ref 11.5–15.5)
WBC: 15.4 10*3/uL — ABNORMAL HIGH (ref 4.0–10.5)
nRBC: 0 % (ref 0.0–0.2)

## 2021-11-23 LAB — BASIC METABOLIC PANEL
Anion gap: 12 (ref 5–15)
BUN: 36 mg/dL — ABNORMAL HIGH (ref 6–20)
CO2: 35 mmol/L — ABNORMAL HIGH (ref 22–32)
Calcium: 8.8 mg/dL — ABNORMAL LOW (ref 8.9–10.3)
Chloride: 96 mmol/L — ABNORMAL LOW (ref 98–111)
Creatinine, Ser: 0.91 mg/dL (ref 0.44–1.00)
GFR, Estimated: >60 mL/min (ref >60)
Glucose, Bld: 203 mg/dL — ABNORMAL HIGH (ref 70–99)
Potassium: 3.6 mmol/L (ref 3.5–5.1)
Sodium: 143 mmol/L (ref 135–145)

## 2021-11-23 LAB — GLUCOSE, CAPILLARY
Glucose-Capillary: 255 mg/dL — ABNORMAL HIGH (ref 70–99)
Glucose-Capillary: 199 mg/dL — ABNORMAL HIGH (ref 70–99)
Glucose-Capillary: 172 mg/dL — ABNORMAL HIGH (ref 70–99)
Glucose-Capillary: 217 mg/dL — ABNORMAL HIGH (ref 70–99)
Glucose-Capillary: 213 mg/dL — ABNORMAL HIGH (ref 70–99)
Glucose-Capillary: 254 mg/dL — ABNORMAL HIGH (ref 70–99)

## 2021-11-23 LAB — MAGNESIUM: Magnesium: 2.4 mg/dL (ref 1.7–2.4)

## 2021-11-23 LAB — ECHO TEE
MV M vel: 4.76 m/s
MV Peak grad: 90.6 mmHg
Radius: 0.75 cm

## 2021-11-23 LAB — PHOSPHORUS: Phosphorus: 2.3 mg/dL — ABNORMAL LOW (ref 2.5–4.6)

## 2021-11-23 LAB — TRIGLYCERIDES: Triglycerides: 239 mg/dL — ABNORMAL HIGH (ref <150)

## 2021-11-23 MED ORDER — SODIUM CHLORIDE 0.9 % IV SOLN: INTRAVENOUS | Status: DC

## 2021-11-23 MED ORDER — FUROSEMIDE 10 MG/ML IJ SOLN
40.0000 mg | Freq: Two times a day (BID) | INTRAMUSCULAR | Status: AC
  Administered 2021-11-23 (×2): 40 mg via INTRAVENOUS
  Filled 2021-11-23 (×2): qty 4

## 2021-11-23 MED ORDER — MIDAZOLAM HCL 2 MG/2ML IJ SOLN
2.0000 mg | INTRAMUSCULAR | Status: DC | PRN
  Administered 2021-11-26: 2 mg via INTRAVENOUS
  Filled 2021-11-23 (×3): qty 2

## 2021-11-23 MED ORDER — POTASSIUM CHLORIDE 20 MEQ PO PACK
40.0000 meq | PACK | Freq: Once | ORAL | Status: AC
  Administered 2021-11-23: 40 meq
  Filled 2021-11-23: qty 2

## 2021-11-23 MED ORDER — ROCURONIUM BROMIDE 10 MG/ML (PF) SYRINGE
1.0000 mg/kg | PREFILLED_SYRINGE | Freq: Once | INTRAVENOUS | Status: AC
  Administered 2021-11-23: 79.6 mg via INTRAVENOUS
  Filled 2021-11-23: qty 10

## 2021-11-23 MED ORDER — FENTANYL CITRATE PF 50 MCG/ML IJ SOSY
50.0000 ug | PREFILLED_SYRINGE | INTRAMUSCULAR | Status: AC | PRN
  Administered 2021-11-24 (×3): 50 ug via INTRAVENOUS
  Filled 2021-11-23 (×3): qty 1

## 2021-11-23 MED ORDER — SODIUM CHLORIDE 0.9 % IV SOLN
2.0000 g | Freq: Two times a day (BID) | INTRAVENOUS | Status: DC
  Administered 2021-11-23 – 2021-12-05 (×25): 2 g via INTRAVENOUS
  Filled 2021-11-23 (×25): qty 20

## 2021-11-23 MED ORDER — FENTANYL CITRATE PF 50 MCG/ML IJ SOSY
50.0000 ug | PREFILLED_SYRINGE | INTRAMUSCULAR | Status: DC | PRN
  Administered 2021-11-23 – 2021-11-24 (×2): 100 ug via INTRAVENOUS
  Administered 2021-11-25: 50 ug via INTRAVENOUS
  Administered 2021-11-25: 100 ug via INTRAVENOUS
  Administered 2021-11-25: 50 ug via INTRAVENOUS
  Filled 2021-11-23: qty 2
  Filled 2021-11-23: qty 1
  Filled 2021-11-23: qty 4
  Filled 2021-11-23: qty 2

## 2021-11-23 MED ORDER — MIDAZOLAM HCL 2 MG/2ML IJ SOLN
2.0000 mg | Freq: Once | INTRAMUSCULAR | Status: AC
  Administered 2021-11-23: 2 mg via INTRAVENOUS

## 2021-11-23 MED ORDER — K PHOS MONO-SOD PHOS DI & MONO 155-852-130 MG PO TABS
250.0000 mg | ORAL_TABLET | Freq: Two times a day (BID) | ORAL | Status: AC
  Administered 2021-11-23 (×2): 250 mg
  Filled 2021-11-23 (×2): qty 1

## 2021-11-23 NOTE — TOC Initial Note (Addendum)
Transition of Care (TOC) - Initial/Assessment Note  ? ? ?Patient Details  ?Name: Kristen Murphy ?MRN: [id number] ?Date of Birth: 1966-04-15 ? ?Transition of Care (TOC) CM/SW Contact:    ?Verdell Carmine, RN ?Phone Number: ?11/23/2021, 12:09 PM ? ?Clinical Narrative:                 ? ?56 yo patient admitted with brain abbesses, history of previous stroke. Son is listed as NOK. . Patient is intubated, sedated after PEA arrest  with diffuse encephalopathy. On day 10 of IV Abx WBC 15.4 .ID, neurology on case. DVT from PICC line, will need 3 months of lovenox.  ?Patient moving extremities ( upper) at times. Currently on fentanyl and propofol due to ventilator dyssynchrony.   Would be beneficial to consult with palliative care for [place] discussion.  Patient would not be a LTAC candidate due to insurance barriers.Patient will have an TEE today to rule out endocarditis  ? ?Disposition: continue in ICU intubated,  GOC / disposition uncertain at this time.  MD to progressively discuss with family. Day 10- getting to the point of decision making for Trach/PEG if needed.  ? ?. Care Management will follow for needs, recommendation, and transitions of care. ? ?  ?Barriers to Discharge: Continued Medical Work up ? ? ?Patient Goals and CMS Choice ?  ?  ?  ? ?Expected Discharge Plan and Services ?  ?In-house Referral: Clinical Social Work ?Discharge Planning Services: CM Consult ?  ?Living arrangements for the past 2 months: [place] ?                ?  ?  ?  ?  ?  ?  ?  ?  ?  ?  ? ?Prior Living Arrangements/Services ?Living arrangements for the past 2 months: [place] ?  ?Patient language and need for interpreter reviewed:: Yes ?       ?Need for Family Participation in Patient Care: Yes (Comment) ?Care giver support system in place?: Yes (comment) ?  ?Criminal Activity/Legal Involvement Pertinent to Current Situation/Hospitalization: No - Comment as needed ? ?Activities of Daily Living ?Home Assistive  Devices/Equipment: None ?ADL Screening (condition at time of admission) ?Patient's cognitive ability adequate to safely complete daily activities?: No ?Is the patient deaf or have difficulty hearing?: No ?Does the patient have difficulty seeing, even when wearing glasses/contacts?: No ?Does the patient have difficulty concentrating, remembering, or making decisions?: Yes ?Patient able to express need for assistance with ADLs?: No ?Does the patient have difficulty dressing or bathing?: Yes ?Independently performs ADLs?: No ?Communication: Dependent ?Is this a change from baseline?: Change from baseline, expected to last >3 days ?Dressing (OT): Dependent ?Is this a change from baseline?: Change from baseline, expected to last >3 days ?Grooming: Dependent ?Is this a change from baseline?: Change from baseline, expected to last >3 days ?Feeding: Dependent ?Is this a change from baseline?: Change from baseline, expected to last >3 days ?Bathing: Dependent ?Is this a change from baseline?: Change from baseline, expected to last >3 days ?Toileting: Dependent ?Is this a change from baseline?: Change from baseline, expected to last >3days ?In/Out Bed: Dependent ?Is this a change from baseline?: Change from baseline, expected to last >3 days ?Walks in Home: Dependent ?Is this a change from baseline?: Change from baseline, expected to last >3 days ?Does the patient have difficulty walking or climbing stairs?: Yes ?Weakness of Legs: Both ?Weakness of Arms/Hands: Both ? ?Permission Sought/Granted ?  ?  ?   ?   ?   ?   ? ?  Emotional Assessment ?  ?Attitude/Demeanor/Rapport: Intubated (Following Commands or Not Following Commands) ?  ?Orientation: : Fluctuating Orientation (Suspected and/or reported [place]) ?Alcohol / Substance Use: Other (comment) (substance abuse) ?Psych Involvement: No (comment) ? ?Admission diagnosis:  Cardiac arrest [facility]) [I46.9] ?Respiratory failure ([place]) [J96.90] ?Sepsis, due to unspecified organism,  unspecified whether acute organ dysfunction present ([place]) [A41.9] ?Hematemesis, unspecified whether nausea present [K92.0] ?Patient Active Problem List  ? Diagnosis Date Noted  ? MRSA pneumonia ([place]) 11/15/2021  ? Brain abscess 11/15/2021  ? Cardiac arrest ([place]) 11/12/2021  ? Hematemesis   ? Respiratory failure ([place])   ? Sepsis ([place])   ? Stroke [facility])   ? Seizures ([place])   ? Hypertension   ? Heart murmur   ? Headache   ? Dyspnea   ? Diabetes mellitus without complication ([place])   ? Depression   ? Chronic kidney disease   ? Asthma   ? Coronary artery disease   ? Arthritis   ? Anxiety   ? Anemia   ? Cervical cancer ([place]) 01/26/2020  ? CAD in native artery 02/27/2019  ? Dyslipidemia (high LDL; low HDL) 02/27/2019  ? Non-ST elevation (NSTEMI) myocardial infarction ([place]) 02/18/2019  ? NSTEMI (non-ST elevated myocardial infarction) ([place]) 02/18/2019  ? HTN (hypertension) 02/20/2017  ? DM (diabetes mellitus) ([place]) 02/20/2017  ? Cerebrovascular disease 02/20/2017  ? Tobacco use disorder 02/20/2017  ? Opioid use disorder, severe, dependence ([place]) 02/20/2017  ? Sedative, hypnotic or anxiolytic use disorder, severe, dependence ([place]) 02/20/2017  ? Sedative hypnotic withdrawal ([place]) 02/20/2017  ? Methamphetamine use disorder, moderate ([place]) 02/20/2017  ? Alcohol use disorder, moderate, dependence ([place]) 02/20/2017  ? MDD (major depressive disorder) 02/19/2017  ? ?PCP:  Gweneth Fritter, FNP ?Pharmacy:   ?[facility] ?[address] ?[place] [address] ?Phone: [phone] Fax: [phone] ? ? ? ? ?Social Determinants of Health (SDOH) Interventions ?  ? ?Readmission Risk Interventions ?No flowsheet data found. ? ? ?

## 2021-11-23 NOTE — Progress Notes (Incomplete)
11/23/2021   I have seen and evaluated the patient for multiorgan failure in setting of MRSA septicemia.  S:  ***  O: Blood pressure (!) 146/78, pulse (!) 110, temperature (!) 101.3 F (38.5 C), temperature source Oral, resp. rate (!) 21, height 5\' 2"  (1.575 m), weight 79.6 kg, SpO2 94 %.  Constitutional: ***  Eyes: *** Ears, nose, mouth, and throat: *** Cardiovascular: *** Respiratory: *** Gastrointestinal: *** Skin: No rashes, normal turgor Neurologic: *** Psychiatric: ***   A:  MRSA bacteremia with CNS and pulmonary spread. Acute hypoxemic respiratory failure due to necrosing MRSA pneumonia of  Multifactorial encephalopathy in setting of MRSA CNS infection, sedation, post arrest,and sepsis.  LTVEEG d/c'd 3/1.  No active seizures, active focus in left frontal region Acute on chronic anemia, UGIB- s/p EGD on 2/19 showing erosive gastropathy Hx tobacco abuse Hx cervical Ca post nephrectomy and diverting colostomy S/P in ER PEA arrest 2/19 Hx CAD s/p LAD stent 2020 DM Depression CKD Seizure hx HTN Remote CVA Distant hx substance abuse  P:  - Vent support, VAP prevention bundle, usual LTVV - Abx per ID - ***  Patient critically ill due to *** Interventions to address this today *** Risk of deterioration without these interventions is high  I personally spent *** minutes providing critical care not including any separately billable procedures  Erskine Emery MD [facility] Prefer epic messenger for cross cover needs If after hours, please call E-link

## 2021-11-23 NOTE — Progress Notes (Signed)
? ?  [facility] has been requested to perform a transesophageal echocardiogram on Kristen Murphy for bacteremia/brain abscess  After careful review of history and examination, the risks and benefits of transesophageal echocardiogram have been explained including risks of esophageal damage, perforation (1:10,000 risk), bleeding, pharyngeal hematoma as well as other potential complications associated with conscious sedation including aspiration, arrhythmia, respiratory failure and death. Alternatives to treatment were discussed, questions were answered. Patient is currently intubated. Discussed procedure with son, Kristen Murphy, who agree to proceed with procedure. ? ?Procedure will be performed at bedside today round 2:30pm with Dr. Johney Frame. ? ?Darreld Mclean, PA-C ?11/23/2021 1:06 PM  ? ?

## 2021-11-23 NOTE — Progress Notes (Signed)
?  [facility] ? ? ?Reason for visit: follow up on brain abscess ? ?Interval History: Tmax up to 101.3, WBC stable at 15.4.   ?Day 12 total antibiotics ?Day 4 meropenem ?Day 10 linezolid ? ?Physical Exam: ?Constitutional:  ?Vitals:  ? 11/23/21 0700 11/23/21 0800  ?BP: (!) 116/57 (!) 146/78  ?Pulse: 77 (!) 110  ?Resp: (!) 33 (!) 21  ?Temp:    ?SpO2: (!) 88% 94%  ?No response ?HENT: +ET ?Respiratory: respiratory effort on vent ? ?Review of Systems: ?Unable to be assessed due to patient factors ? ?Lab Results  ?Component Value Date  ? WBC 15.4 (H) 11/23/2021  ? HGB 10.2 (L) 11/23/2021  ? HCT 33.9 (L) 11/23/2021  ? MCV 90.9 11/23/2021  ? PLT 271 11/23/2021  ?  ?Lab Results  ?Component Value Date  ? CREATININE 0.91 11/23/2021  ? BUN 36 (H) 11/23/2021  ? NA 143 11/23/2021  ? K 3.6 11/23/2021  ? CL 96 (L) 11/23/2021  ? CO2 35 (H) 11/23/2021  ?  ?Lab Results  ?Component Value Date  ? ALT 25 11/16/2021  ? AST 19 11/16/2021  ? ALKPHOS 202 (H) 11/16/2021  ?  ? ?Microbiology: ?Recent Results (from the past 240 hour(s))  ?CSF culture w Stat Gram Stain     Status: None  ? Collection Time: 11/15/21  8:02 AM  ? Specimen: CSF; Cerebrospinal Fluid  ?Result Value Ref Range Status  ? Specimen Description CSF  Final  ? Special Requests NONE  Final  ? Gram Stain   Final  ?  WBC PRESENT,BOTH PMN AND MONONUCLEAR ?NO ORGANISMS SEEN ?CYTOSPIN SMEAR ?  ? Culture   Final  ?  NO GROWTH ?Performed at [facility], [place] [address], [place], [place] [address] ?  ? Report Status 11/18/2021 FINAL  Final  ?Toxoplasma Gondii, PCR     Status: None  ? Collection Time: 11/15/21  8:20 AM  ? Specimen: Cerebrospinal Fluid  ?Result Value Ref Range Status  ? Toxoplasma Gondii, PCR Negative Negative Final  ?  Comment: (NOTE) ?No Toxoplasma gondii DNA detected. ?This test was developed and its performance characteristics ?determined by [employer]. It has not been cleared or ?approved by the U.S. Food and Drug  Administration. The FDA has ?determined that such clearance or approval is not necessary. This ?test is used for clinical purposes. It should not be regarded as ?investigational or research. ?Performed At: [facility] ?[address] [place], [place] [id number] ?Rush Farmer MD [id number] ?  ?Acid Fast Smear (AFB)     Status: None  ? Collection Time: 11/15/21  9:23 AM  ? Specimen: CSF; Cerebrospinal Fluid  ?Result Value Ref Range Status  ? AFB Specimen Processing Direct Inoculation  Final  ? Acid Fast Smear Negative  Final  ?  Comment: (NOTE) ?Performed At: [facility] ?[address] [place], [place] [id number] ?Rush Farmer MD [id number] ?  ? Source (AFB) CSF  Final  ?  Comment: Performed at [facility], [place] [address] [place], [place] [address]  ?Culture, fungus without smear     Status: None (Preliminary result)  ? Collection Time: 11/15/21  9:24 AM  ? Specimen: CSF; Other  ?Result Value Ref Range Status  ? Specimen Description CSF  Final  ? Special Requests Normal  Final  ? Culture   Final  ?  NO FUNGUS ISOLATED AFTER 7 DAYS ?Performed at [facility], [place] [address], [place], [place] [address] ?  ?  Report Status PENDING  Incomplete  ? ? ?Impression/Plan:  ?1. Brain abscess - multiple areas and no certain etiology.  Could be embolic from endocarditis and TEE to be done today.  The only positive culture is the MRSA in the lungs and may not explain the probable embolic findings in the brain, save the presence of a PFO and TV endocarditis.   ?Will continue with linezolid and change the meropenem to CNS dosed ceftriaxone.  Not likely Pseudomonas or other gram negative organisms.   ? ?2.  Pulmonary opacities - numerous small nodular opacities on CT that was new from previous CT and I question if these are embolic.  Also with lower lobe consolidative cavitary lesions.  Culture with MRSA and on linezolid as above.  Will continue with this for now with the complex  infection/nodules.   ? ?3.  Acute metabolic encephalopathy - from brain abscesses and likely resultant damage and EEG suggestive of left frontal cortical dysfunction.  No further seizure activity.  ? ? ? ?  ?

## 2021-11-23 NOTE — CV Procedure (Signed)
? ? ? ?  Transesophageal Echocardiogram Note ? ?[name] ?[id number] ?03-26-1966 ? ?Procedure: Transesophageal Echocardiogram ?Indications: Concern for septic emboli ? ?Procedure Details ?Consent: Obtained ?Time Out: Verified patient identification, verified procedure, site/side was marked, verified correct patient position, special equipment/implants available, Radiology Safety Procedures followed,  medications/allergies/relevent history reviewed, required imaging and test results available.  Performed ? ?Medications: ?Rocuronium 80mg  ?Versed 2mg  ? ?Left Ventrical:  LVEF 60-65% ? ?Mitral Valve: The mitral valve leaflets are thickened and calcified. Leaflet motion restricted without significant stenosis. The anterior mitral valve leaflet regions of asymmetric thickening with small mobile densities which may represent vegetations vs redundant MV tissue vs prior endocarditis. There is mild-to-moderate MR ? ?Aortic Valve: Tricuspid. Mild thickening. No vegetations visualized ? ?Tricuspid Valve: Normal structure. Trivial TR. No vegetations visualized ? ?Pulmonic Valve: Normal structure, trivial PI. No vegetations visualized ? ?Left Atrium/ Left atrial appendage: No LAA thrombus ? ?Atrial septum: No PFO by color doppler ? ?Aorta: Mild plaquing ? ? ?Complications: No complications with TEE procedure. Patient did become hypoxic on the vent that resolved with aggressive suction ? ?Patient did tolerate procedure well. ? ?Freada Bergeron, MD ?11/23/2021, 4:53 PM  ?

## 2021-11-23 NOTE — Procedures (Signed)
Patient Name: Kristen Murphy  ?MRN: [id number]  ?Epilepsy Attending: Lora Havens  ?Referring Physician/Provider: Donnetta Simpers, MD ?Duration: 11/22/2021 5396 to 11/22/2021 1324 ?  ?Patient history: 57 year old woman with a history of cervical cancer, status post nephrectomy 2 months ago, colostomy, CAD, diabetes, depression, hypertension, remote history of stroke without residual deficits who was brought in by EMS for altered mental status.  EEG to evaluate for seizure. ?  ?Level of alertness: lethargic ?  ?AEDs during EEG study: LEV, Vimpat ?  ?Technical aspects: This EEG study was done with scalp electrodes positioned according to the 10-20 International system of electrode placement. Electrical activity was acquired at a sampling rate of 500Hz  and reviewed with a high frequency filter of 70Hz  and a low frequency filter of 1Hz . EEG data were recorded continuously and digitally stored.  ?  ?Description: EEG showed continuous generalized 3 to 6 Hz theta-delta slowing which at times appears sharply contoured with triphasic morphology.  Sharp transients were noted in left frontal region.  Hyperventilation and photic stimulation were not performed.    ?   ?ABNORMALITY ?- Continuous slow, generalized  ?  ?IMPRESSION: ?This study is suggestive of moderate to severe diffuse encephalopathy, nonspecific etiology.  No seizures or definite epileptiform discharges were seen during the study. ? ?Lora Havens  ?

## 2021-11-23 NOTE — Progress Notes (Signed)
? ? ? ?NAME:  Kristen Murphy, MRN:  [id number], DOB:  23-Mar-1966, LOS: 105 ?ADMISSION DATE:  11/12/2021, CONSULTATION DATE: 11/12/2021 ?REFERRING MD: Dr. Langston Masker, CHIEF COMPLAINT: PEA arrest ? ?History of Present Illness:  ?56 year old woman with a history of tobacco use, cervical cancer ([facility]), post nephrectomy and colostomy, CAD with LAD stent 01/2019, diabetes, depression, hypertension, remote CVA without known deficits.  Also with a history of substance abuse.  Brought in by her son to the ED 2/19 with fever, altered mental status and slurred speech. ? ?Apparently she had a flulike illness for a week, was found confused on 2/19.  Some suspicion by son that she may have been taking more prescription narcotics, also taking many NSAIDs per his report.  She was disoriented in the ED, febrile 103F, hyperglycemic. ?In the ED she apparently experienced acute hypoxemia, had some blood from her mouth, question emesis versus hemoptysis.  Devolved to PEA and required emergent ET intubation and CPR.  ROSC after 2 minutes.  OG tube placed with bright red blood obtained.  Chest x-ray with mild right hilar prominence, more notable on postintubation film.  No infiltrates or effusions ?She has received empiric antibiotics, is receiving 1 unit PRBC, is about to start Protonix infusion. ?Hemodynamically stabilized, mechanically ventilated. ? ?She remains critically ill ? ?Pertinent  Medical History  ? ?Past Medical History:  ?Diagnosis Date  ? Anemia   ? Anxiety   ? Arthritis   ? Asthma   ? Cervical cancer ([place])   ? Chronic kidney disease   ? Coronary artery disease   ? Depression   ? Diabetes mellitus without complication ([place])   ? Dyspnea   ? Headache   ? Heart murmur   ? Hx of adenomatous polyp of colon   ? Hypertension   ? Rectovaginal fistula   ? Seizures ([place])   ? Stercoral ulcer of rectum 12/2020  ? Stroke [facility])   ? ? ?Significant Hospital Events: ?Including procedures, antibiotic start and stop dates in addition to other  pertinent events   ?2/19 Intubated. Head CT  > no acute CT findings, old left basal ganglia lacunar, old left frontal cortical and subcortical infarct ?2/24 MR Brain - Numerous lesions with mild associated hemorrhage. Mild edema. Concerned for brain abscesses ?2/22 LP consistent with bacterial meningitis. Respiratory Cx +MRSA ?2/25 MR Liver with no lesions, hypoperfusion to the subcapsular right hepatic lobe, favor benign vascular phenomena, distended gallbladder, persistent region of hypoperfusion to the  ?2/28 No acute events overnight, LTM read ? Seizure.  Cefepime switched to meropenem. ? ?Interim History / Subjective:  ?Tmax 101.3 ?+5.6 L admission, -1.9 L past 24 hours, 4.7 L of urine output, 550 of stool ? ?EEG discontinued ? ?Intubated/sedated ? ?Dex 1.2 ? ?Unable to obtain subjective evaluation due to patient status ? ?Objective   ?Blood pressure (!) 146/78, pulse (!) 110, temperature (!) 101.3 ?F (38.5 ?C), temperature source Oral, resp. rate (!) 21, height 5\' 2"  (1.575 m), weight 79.6 kg, SpO2 94 %. ?   ?Vent Mode: PRVC ?FiO2 (%):  [45 %-55 %] 50 % ?Set Rate:  [28 bmp] 28 bmp ?Vt Set:  [400 mL] 400 mL ?PEEP:  [8 cmH20] 8 cmH20 ?Pressure Support:  [8 cmH20] 8 cmH20 ?Plateau Pressure:  [16 cmH20-20 cmH20] 18 cmH20  ? ?Intake/Output Summary (Last 24 hours) at 11/23/2021 0918 ?Last data filed at 11/23/2021 0700 ?Gross per 24 hour  ?Intake 3745.91 ml  ?Output 5280 ml  ?Net -1534.09 ml  ? ?Filed  Weights  ? 11/21/21 0500 11/22/21 0349 11/23/21 0500  ?Weight: 78.6 kg 78.4 kg 79.6 kg  ? ? ?Physical Exam: ?General: In bed, NAD, ill-appearing ?HEENT: MM pink/moist, anicteric, atraumatic ?Neuro: RASS -2, PERRL 7mm, extension in the uppers to pain, slight flexion in lower extremities, right gaze deviation, eyes open to pain ?CV: S1S2, ST, no m/r/g appreciated ?PULM: Air movement present in all lobes, trachea midline, chest expansion symmetric ?GI: soft, bsx4 active, ostomy with brown output ?Extremities: warm/dry,  generalized pretibial edema, capillary refill less than 3 seconds  ?Skin:  no rashes or lesions noted ? ?Pertinent labs ?CSF no fungus after 7 days, culture, no growth final ?Blood glucose 199-255 ?WBC 10.9> 13.7> 15.4 ?Hemoglobin 9.1> 10.2 ?BMP: Testing 3.6, BUN 36, CO2 35, anion gap 12 ?Phos 2.3 ?Triglyceride 329 ? ?Resolved Hospital Problem list   ?Hyponatremia ?Hypokalemia ?Hypocalcemia ? ?Assessment & Plan:  ? ?Multiple brain abscesses ?Bacterial meningitis ?Acute metabolic encephalopathy secondary to above ?-Repeat CT head 2/19 neg for acute findings.   ?-EEG and LTM 2/25 neg for seizures.  Consider also substances as son concerned about possible narcotic use.  UDS only positive for benzo.  ?-MRI brain 2/21 numerous lesions with mild associated hemorrhage and edema. Neurosurgery reviewed imaging and patient not a good candidate for lesion aspiration. ?-Repeat head CT 2/27 with no acute changes  ?-EEG discontinued 3/1, CSF no fungus after 7 days, culture, no growth final ?P: ?-Appreciate ID assistance ?-Continue meropenem and linezolid.  Avoid cefepime due to encephalopathy ?-Plan for TTE today.  Concern for endocarditis. ?-Continue contact precautions ?-Stopped fentanyl drip switched to as needed fentanyl push, continue Precedex wean sedation as able ?-Versed as needed for seizure ?-Keppra and Vimpat ? ?Acute respiratory failure with hypoxemia secondary to necrotizing MRSA pneumonia ?Increasing FiO2 overnight from 40% to 55%, weaned for 5 hours on pressure support on 3/1 ?P: ?-LTVV strategy with tidal volumes of 4-8 cc/kg ideal body weight ?-Goal plateau pressures less than 30 and driving pressures less than 15 ?-Wean PEEP/FiO2 for SpO2 92-98% ?-VAP bundle ?-Wean sedation and attempt SBT/SAT today after TEE ?-PAD bundle with Precedex gtt and fentanyl push ?-RASS goal 0 to -1 ?-Follow intermittent CXR and ABG PRN ?-Continue diuresis, 40mg  IV Lasix once ? ?Sepsis secondary necrotizing MRSA pneumonia, bacterial  meningitis ?-Also urine culture polymicrobial with 1k Enterobacter and 3K Pseudomonas. O remains off Levophed. WBC 10.9> 13.7> 15.4.  Suspect white blood cell count elevation secondary to known DVT.  ?Acute cardiopulmonary arrest, PEA arrest ?Hypertension ?Tachycardia ?History of CAD ?DVT from PICC line ?Remains off vasopressors ?P: ?-ID following appreciate assistance ?-TEE as above.   ?-Continue amlodipine 10 daily, metoprolol 25 twice daily, as needed hydralazine, continue ASA ?-Continue Lovenox twice daily.  Will require 3 months for provoked thrombosis. ?-Continuous telemetry ?-Diuresis as above ? ?Acute blood loss/chronic anemia  ?Hemoglobin 9.1> 10.2, no signs of active bleeding ?History of NSAID abuse ?-EGD blood clots in gastric fundus/body, erosive gastropathy, no stigmata of bleeding but OG tube trauma in body ?P: ?-Transfuse PRBC if HBG less than 7 ?-Obtain AM CBC to trend H&H ?-Monitor for signs of bleeding ? ?Diabetes with hyperglycemia ?Bg  207- 293 ?P: ?-Blood Glucose goal 140-180. ?-SSI ?-Continue insulin glargine 20 units twice daily, tube feeds currently held for TEE.  Holding on increases at the moment. ?-Continue holding Home Metformin, and Farxiga ? ?Hypophosphatemia ?-Replete ?- Follow-up on a.m. labs ? ?Best Practice (right click and "Reselect all SmartList Selections" daily)  ? ?Diet/type: tubefeeds ?DVT prophylaxis:  LMWH ?GI prophylaxis: PPI ?Lines: N/A  ?Foley:  N/A ?Code Status:  full code ?Last date of multidisciplinary goals of care discussion: Family updated at bedside 3/1. GOC ongoing ? ?Critical care time: 38 minutes  ? ? ?Redmond School., MSN, APRN, AGACNP-BC ?[facility]  ?11/23/2021 , 9:18 AM ? ?Please see [email or web address] for pager details ? ?If no response, please call [phone] ?After hours, please call Elink at [phone] ? ? ? ?

## 2021-11-24 DIAGNOSIS — G060 Intracranial abscess and granuloma: Secondary | ICD-10-CM | POA: Diagnosis not present

## 2021-11-24 LAB — GLUCOSE, CAPILLARY
Glucose-Capillary: 262 mg/dL — ABNORMAL HIGH (ref 70–99)
Glucose-Capillary: 141 mg/dL — ABNORMAL HIGH (ref 70–99)
Glucose-Capillary: 183 mg/dL — ABNORMAL HIGH (ref 70–99)
Glucose-Capillary: 217 mg/dL — ABNORMAL HIGH (ref 70–99)
Glucose-Capillary: 158 mg/dL — ABNORMAL HIGH (ref 70–99)

## 2021-11-24 LAB — CBC
HCT: 34.1 % — ABNORMAL LOW (ref 36.0–46.0)
Hemoglobin: 10.3 g/dL — ABNORMAL LOW (ref 12.0–15.0)
MCH: 28 pg (ref 26.0–34.0)
MCHC: 30.2 g/dL (ref 30.0–36.0)
MCV: 92.7 fL (ref 80.0–100.0)
Platelets: 285 10*3/uL (ref 150–400)
RBC: 3.68 MIL/uL — ABNORMAL LOW (ref 3.87–5.11)
RDW: 18.8 % — ABNORMAL HIGH (ref 11.5–15.5)
WBC: 13.3 10*3/uL — ABNORMAL HIGH (ref 4.0–10.5)
nRBC: 0 % (ref 0.0–0.2)

## 2021-11-24 LAB — BASIC METABOLIC PANEL
Anion gap: 11 (ref 5–15)
BUN: 41 mg/dL — ABNORMAL HIGH (ref 6–20)
CO2: 30 mmol/L (ref 22–32)
Calcium: 8.4 mg/dL — ABNORMAL LOW (ref 8.9–10.3)
Chloride: 93 mmol/L — ABNORMAL LOW (ref 98–111)
Creatinine, Ser: 0.83 mg/dL (ref 0.44–1.00)
GFR, Estimated: >60 mL/min (ref >60)
Glucose, Bld: 214 mg/dL — ABNORMAL HIGH (ref 70–99)
Potassium: 3.7 mmol/L (ref 3.5–5.1)
Sodium: 134 mmol/L — ABNORMAL LOW (ref 135–145)

## 2021-11-24 LAB — PHOSPHORUS: Phosphorus: 3.3 mg/dL (ref 2.5–4.6)

## 2021-11-24 LAB — MAGNESIUM: Magnesium: 2.4 mg/dL (ref 1.7–2.4)

## 2021-11-24 MED ORDER — PROPOFOL 10 MG/ML IV BOLUS: 500.0000 mg | Freq: Once | INTRAVENOUS | Status: DC

## 2021-11-24 MED ORDER — INSULIN ASPART 100 UNIT/ML IJ SOLN
12.0000 [IU] | INTRAMUSCULAR | Status: DC
  Administered 2021-11-24 – 2021-12-03 (×38): 12 [IU] via SUBCUTANEOUS

## 2021-11-24 MED ORDER — VECURONIUM BROMIDE 10 MG IV SOLR
10.0000 mg | Freq: Once | INTRAVENOUS | Status: AC
Start: 2021-11-25 — End: 2021-11-25
  Administered 2021-11-25: 5 mg via INTRAVENOUS
  Filled 2021-11-24: qty 10

## 2021-11-24 MED ORDER — INFLUENZA VAC SPLIT QUAD 0.5 ML IM SUSY
0.5000 mL | PREFILLED_SYRINGE | INTRAMUSCULAR | Status: AC
  Administered 2021-11-25: 0.5 mL via INTRAMUSCULAR
  Filled 2021-11-24: qty 0.5

## 2021-11-24 MED ORDER — FENTANYL CITRATE PF 50 MCG/ML IJ SOSY
200.0000 ug | PREFILLED_SYRINGE | Freq: Once | INTRAMUSCULAR | Status: AC
  Administered 2021-11-25: 100 ug via INTRAVENOUS
  Filled 2021-11-24: qty 4

## 2021-11-24 MED ORDER — FENTANYL CITRATE PF 50 MCG/ML IJ SOSY: 200.0000 ug | PREFILLED_SYRINGE | Freq: Once | INTRAMUSCULAR | Status: DC

## 2021-11-24 MED ORDER — HEPARIN (PORCINE) 25000 UT/250ML-% IV SOLN
900.0000 [IU]/h | INTRAVENOUS | Status: AC
  Administered 2021-11-24: 1000 [IU]/h via INTRAVENOUS
  Filled 2021-11-24: qty 250

## 2021-11-24 MED ORDER — MIDAZOLAM HCL 2 MG/2ML IJ SOLN
5.0000 mg | Freq: Once | INTRAMUSCULAR | Status: AC
  Administered 2021-11-25: 2 mg via INTRAVENOUS
  Filled 2021-11-24: qty 6

## 2021-11-24 MED ORDER — MIDAZOLAM HCL 2 MG/2ML IJ SOLN: 5.0000 mg | Freq: Once | INTRAMUSCULAR | Status: DC

## 2021-11-24 MED ORDER — ETOMIDATE 2 MG/ML IV SOLN
40.0000 mg | Freq: Once | INTRAVENOUS | Status: AC
  Administered 2021-11-25: 20 mg via INTRAVENOUS
  Filled 2021-11-24: qty 20

## 2021-11-24 MED ORDER — VECURONIUM BROMIDE 10 MG IV SOLR: 10.0000 mg | Freq: Once | INTRAVENOUS | Status: DC

## 2021-11-24 MED ORDER — ETOMIDATE 2 MG/ML IV SOLN: 40.0000 mg | Freq: Once | INTRAVENOUS | Status: DC

## 2021-11-24 NOTE — Progress Notes (Signed)
?  [facility] ? ? ?Reason for visit: follow up on brain abscess ? ?Interval History:  Tmax 100; WBC 13.3;.  remains unresponsive.  TEE with possible signs of vegetaion on the mitral valve ?Day 13 total antibiotics ?Day 2 ceftriaxone  ?Day 11 linezolid ? ?Physical Exam: ?Constitutional:  ?Vitals:  ? 11/24/21 0700 11/24/21 0739  ?BP: 122/65   ?Pulse: 85   ?Resp: (!) 28   ?Temp:  100 ?F (37.8 ?C)  ?SpO2: 97%   ?No response ?HENT: +ET ?Respiratory: respiratory effort on vent ? ?Review of Systems: ?Unable to be assessed due to patient factors ? ?Lab Results  ?Component Value Date  ? WBC 13.3 (H) 11/24/2021  ? HGB 10.3 (L) 11/24/2021  ? HCT 34.1 (L) 11/24/2021  ? MCV 92.7 11/24/2021  ? PLT 285 11/24/2021  ?  ?Lab Results  ?Component Value Date  ? CREATININE 0.83 11/24/2021  ? BUN 41 (H) 11/24/2021  ? NA 134 (L) 11/24/2021  ? K 3.7 11/24/2021  ? CL 93 (L) 11/24/2021  ? CO2 30 11/24/2021  ?  ?Lab Results  ?Component Value Date  ? ALT 25 11/16/2021  ? AST 19 11/16/2021  ? ALKPHOS 202 (H) 11/16/2021  ?  ? ?Microbiology: ?Recent Results (from the past 240 hour(s))  ?CSF culture w Stat Gram Stain     Status: None  ? Collection Time: 11/15/21  8:02 AM  ? Specimen: CSF; Cerebrospinal Fluid  ?Result Value Ref Range Status  ? Specimen Description CSF  Final  ? Special Requests NONE  Final  ? Gram Stain   Final  ?  WBC PRESENT,BOTH PMN AND MONONUCLEAR ?NO ORGANISMS SEEN ?CYTOSPIN SMEAR ?  ? Culture   Final  ?  NO GROWTH ?Performed at [facility], [place] [address] [place], [place] [address] ?  ? Report Status 11/18/2021 FINAL  Final  ?Toxoplasma Gondii, PCR     Status: None  ? Collection Time: 11/15/21  8:20 AM  ? Specimen: Cerebrospinal Fluid  ?Result Value Ref Range Status  ? Toxoplasma Gondii, PCR Negative Negative Final  ?  Comment: (NOTE) ?No Toxoplasma gondii DNA detected. ?This test was developed and its performance characteristics ?determined by [employer]. It has not been cleared  or ?approved by the U.S. Food and Drug Administration. The FDA has ?determined that such clearance or approval is not necessary. This ?test is used for clinical purposes. It should not be regarded as ?investigational or research. ?Performed At: [facility] ?[address] [place], [place] [id number] ?Rush Farmer MD [id number] ?  ?Acid Fast Smear (AFB)     Status: None  ? Collection Time: 11/15/21  9:23 AM  ? Specimen: CSF; Cerebrospinal Fluid  ?Result Value Ref Range Status  ? AFB Specimen Processing Direct Inoculation  Final  ? Acid Fast Smear Negative  Final  ?  Comment: (NOTE) ?Performed At: [facility] ?[address] [place], [place] [id number] ?Rush Farmer MD [id number] ?  ? Source (AFB) CSF  Final  ?  Comment: Performed at [facility], [place] [address] [place], [place] [address]  ?Culture, fungus without smear     Status: None (Preliminary result)  ? Collection Time: 11/15/21  9:24 AM  ? Specimen: CSF; Other  ?Result Value Ref Range Status  ? Specimen Description CSF  Final  ? Special Requests Normal  Final  ? Culture   Final  ?  NO FUNGUS ISOLATED AFTER 8 DAYS ?Performed at [facility], [address] [place], [place] [address] ?  ? Report Status PENDING  Incomplete  ? ? ?Impression/Plan:  ?1. Brain abscesses - unclear organism but may be embolic from mitral valve endocarditis.  No positive cultures other than the positive MRSA culture in the lungs which may not necessarily correlate with the findings in the brain since there is no TV involvement or PFO.   ?Therefore, will have her remain on dual antibiotic therapy  ? ?2.  Pulmonary opacities - may be embolic but no signs of TV endocarditis. MRSA in culture and on linezolid.  Will continue with linezolid for #1.   ? ?3.  Acute respiratory failure - she remains on ventilator support and unable to wean off.  CCM considering tracheostomy.   ? ?Dr. Tommy Medal is available over the weekend if needed, otherwise I will  follow up again on Monday.  ? ? ? ?  ?

## 2021-11-24 NOTE — Progress Notes (Signed)
Spoke at length w/ the patients daughter.  ?Updated on current findings.  ? ?Plan ?Lurline Idol this weekend ?Cont supportive care ?If declines family open to another goals of care discussion but really just want more time to see if off sedation we see more evidence of recovery  ? ?[name] ACNP-BC ?[place] ?Pager # [phone] OR # [phone] if no answer ? ?

## 2021-11-24 NOTE — Progress Notes (Signed)
ANTICOAGULATION CONSULT NOTE - Initial Consult ? ?Pharmacy Consult for heparin ?Indication:  Upper Extremity Thrombus ? ?Allergies  ?Allergen Reactions  ? Glimepiride Anaphylaxis  ?  Has sulfa in it per pharmacy  ? Sulfa Antibiotics Anaphylaxis and Hives  ? Effexor [Venlafaxine] Hives  ? ? ?Patient Measurements: ?Height: 5\' 2"  (157.5 cm) ?Weight: 77.2 kg (170 lb 3.1 oz) ?IBW/kg (Calculated) : 50.1 ? ? ?Vital Signs: ?Temp: 98.5 ?F (36.9 ?C) (03/03 1200) ?Temp Source: Oral (03/03 1200) ?BP: 101/51 (03/03 1300) ?Pulse Rate: 67 (03/03 1300) ? ?Labs: ?Recent Labs  ?  11/22/21 ?0139 11/23/21 ?0612 11/24/21 ?0141  ?HGB 9.1* 10.2* 10.3*  ?HCT 31.5* 33.9* 34.1*  ?PLT 219 271 285  ?CREATININE 0.79 0.91 0.83  ? ? ?Estimated Creatinine Clearance: 72.8 mL/min (by C-G formula based on SCr of 0.83 mg/dL). ? ? ?Medical History: ?Past Medical History:  ?Diagnosis Date  ? Anemia   ? Anxiety   ? Arthritis   ? Asthma   ? Cervical cancer ([place])   ? Chronic kidney disease   ? Coronary artery disease   ? Depression   ? Diabetes mellitus without complication ([place])   ? Dyspnea   ? Headache   ? Heart murmur   ? Hx of adenomatous polyp of colon   ? Hypertension   ? Rectovaginal fistula   ? Seizures ([place])   ? Stercoral ulcer of rectum 12/2020  ? Stroke [facility])   ? ? ? ? ?Assessment: ?89yof with MRSA bacteremia, endocarditis, and brain abscess.  S/p VDRF and new UE thrombus > PICC removed and treated with enoxaparin 1mg /kg q12h.  Planning trach over the weekend will change to heparin drip for easier on/off anticoagulation insetting of procedures.   ? ?Goal of Therapy:  ?Heparin level 0.3-0.7 units/ml ?Monitor platelets by anticoagulation protocol: Yes ?  ?Plan:  ?Stop enoxaparin  ?Start heparin 1000 uts/hr drip at 1800 (12hr after last enoxaparin dose)  ?Daily heparin level and CBC ?Monitor s/s bleeding  ? ? ?Bonnita Nasuti Pharm.D. CPP, BCPS ?Clinical Pharmacist ?[phone] ?11/24/2021 2:59 PM  ? ? ? ?

## 2021-11-24 NOTE — Progress Notes (Addendum)
? ?NAME:  Kristen Murphy, MRN:  [id number], DOB:  [birth year], LOS: 12 ?ADMISSION DATE:  11/12/2021, CONSULTATION DATE: 11/12/2021 ?REFERRING MD: Dr. Langston Masker, CHIEF COMPLAINT: PEA arrest ? ?History of Present Illness:  ?56 year old woman with a history of tobacco use, cervical cancer ([facility]), post nephrectomy and colostomy, CAD with LAD stent 01/2019, diabetes, depression, hypertension, remote CVA without known deficits.  Also with a history of substance abuse.  Brought in by her son to the ED 2/19 with fever, altered mental status and slurred speech. ? ?Apparently she had a flulike illness for a week, was found confused on 2/19.  Some suspicion by son that she may have been taking more prescription narcotics, also taking many NSAIDs per his report.  She was disoriented in the ED, febrile 103F, hyperglycemic. ?In the ED she apparently experienced acute hypoxemia, had some blood from her mouth, question emesis versus hemoptysis.  Devolved to PEA and required emergent ET intubation and CPR.  ROSC after 2 minutes.  OG tube placed with bright red blood obtained.  Chest x-ray with mild right hilar prominence, more notable on postintubation film.  No infiltrates or effusions ?She has received empiric antibiotics, is receiving 1 unit PRBC, is about to start Protonix infusion. ?Hemodynamically stabilized, mechanically ventilated. ? ?She remains critically ill. ? ?Pertinent  Medical History  ? ?Past Medical History:  ?Diagnosis Date  ? Anemia   ? Anxiety   ? Arthritis   ? Asthma   ? Cervical cancer ([place])   ? Chronic kidney disease   ? Coronary artery disease   ? Depression   ? Diabetes mellitus without complication ([place])   ? Dyspnea   ? Headache   ? Heart murmur   ? Hx of adenomatous polyp of colon   ? Hypertension   ? Rectovaginal fistula   ? Seizures ([place])   ? Stercoral ulcer of rectum 12/2020  ? Stroke [facility])   ? ? ?Significant Hospital Events: ?Including procedures, antibiotic start and stop dates in addition to other pertinent  events   ?2/19 Intubated. Head CT  > no acute CT findings, old left basal ganglia lacunar, old left frontal cortical and subcortical infarct ?MRI brain 2/21 numerous lesions with mild associated hemorrhage and edema. Neurosurgery reviewed imaging and patient not a good candidate for lesion aspiration. ?2/24 MR Brain - Numerous lesions with mild associated hemorrhage. Mild edema. Concerned for brain abscesses ?2/22 LP consistent with bacterial meningitis. Respiratory Cx +MRSA ?2/25 MR Liver with no lesions, hypoperfusion to the subcapsular right hepatic lobe, favor benign vascular phenomena, distended gallbladder, persistent region of hypoperfusion. EEG and LTM 2/25 neg for seizures.  ?Repeat head CT 2/27 with no acute changes  ?2/28 No acute events overnight, LTM read ? Seizure.  Cefepime switched to meropenem. ?3/2 LTM stopped. Meropenem changed to CTX. TEE + mobile mass on MV w/ mild to mod MR EF 60-65% ?3/3 change to IV heparin for possible trach  ?Interim History / Subjective:  ?No sig change  ?Objective   ?Blood pressure 122/65, pulse 85, temperature 100 ?F (37.8 ?C), temperature source Axillary, resp. rate (Abnormal) 28, height 5\' 2"  (1.575 m), weight 77.2 kg, SpO2 97 %. ?   ?Vent Mode: PRVC ?FiO2 (%):  [60 %-80 %] 60 % ?Set Rate:  [28 bmp] 28 bmp ?Vt Set:  [400 mL] 400 mL ?PEEP:  [8 cmH20-12 cmH20] 12 cmH20 ?Plateau Pressure:  [18 cmH20-22 cmH20] 22 cmH20  ? ?Intake/Output Summary (Last 24 hours) at 11/24/2021 0836 ?Last data filed at  11/24/2021 0600 ?Gross per 24 hour  ?Intake 1798.39 ml  ?Output 1250 ml  ?Net 548.39 ml  ? ?Filed Weights  ? 11/22/21 0349 11/23/21 0500 11/24/21 0500  ?Weight: 78.4 kg 79.6 kg 77.2 kg  ? ? ?Physical Exam: ?General this is a 56 year old critically ill female who remains on full ventilatory support  ?HEENT normocephalic atraumatic does have upward gaze preference orally intubated  ?Pulmonary clear slightly tachypneic remains on 60% FiO2 with PEEP 12, has room to titrate down last  portable chest x-ray on 3/2 demonstrates improved aeration endotracheal tube in good position ?Cardiac tachycardic rhythm with systolic murmur ?Abdomen soft not tender no organomegaly, colostomy unremarkable  ?Extremities warm dry trace dependent edema ?Neuro eyes are open with upward gaze not following commands slight withdrawal to pain, when nursing staff attempted weaning sedation patient became tachypneic ? ?Resolved Hospital Problem list   ?Hyponatremia ?Hypokalemia ?Hypocalcemia ?Septic shock resolved ?Urinary tract infection with Pseudomonas and Enterobacter (completed rx) ?Acute cardiopulmonary arrest, PEA arrest ?Assessment & Plan:  ? 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 Neuro and ID following, LTM remains neg for sz and c/w metabolic encephalopathy  ?Plan ?Linezolid and Ceftriaxone per Infectious disease ?F/u CSF culture ?TEE still pending  ?Cont Keppra and Vimpat  ? ?Acute respiratory failure with hypoxemia secondary to necrotizing MRSA pneumonia ?-Ability to wean complicated by encephalopathic state ?Plan ?Continuing full ventilator support with daily assessment for pressure support ventilation/SBT ?Unfortunately I do think she will need tracheostomy I suspect she could be liberated from the mechanical ventilator if her airway was protected ?VAP bundle ?Antibiotics as mentioned above, she will need a prolonged course ? ?Hypertension ?History of CAD ?Plan ?Cont amlodipine, Lopressor, and aspirin ? ? ?DVT from PICC line ?Plan ?changing low molecular weight heparin to IV heparin for possible trach.  We will treat for 3 months ? ?Acute blood loss/chronic anemia  ?History of NSAID abuse ?-EGD blood clots in gastric fundus/body, erosive gastropathy, no stigmata of bleeding but OG tube trauma in body ?-Hemoglobin stable ?Plan ?Trending CBC ?Monitoring for evidence of bleeding ?Trigger for transfusion  hemoglobin less than 7 ?PPI ? ?Diabetes with hyperglycemia ?Still hyperglycemic but closer to goal ?Plan ?Continue sliding scale insulin ?Holding home oral agents ?Will increase tube feed coverage to 12 units ?Continue basal dosing at 20 twice daily ? ? ?Fluid and electrolyte imbalance: mild hyponatremia ?+ 6.5 liters  ?Plan ?Trend chems ?Hold lasix today  ? ? ?Best Practice (right click and "Reselect all SmartList Selections" daily)  ? ?Diet/type: tubefeeds ?DVT prophylaxis: prophylactic heparin  ?GI prophylaxis: PPI ?Lines: N/A > PICC ?Foley:  N/A ?Code Status:  full code ?Last date of multidisciplinary goals of care discussion: pending ? ?Critical care time: 32 min   ? ?[name] ACNP-BC ?[place] ?Pager # [phone] OR # [phone] if no answer ? ? ? ? ?

## 2021-11-25 ENCOUNTER — Inpatient Hospital Stay (HOSPITAL_COMMUNITY): Payer: Medicaid Other

## 2021-11-25 ENCOUNTER — Inpatient Hospital Stay: Payer: Self-pay

## 2021-11-25 DIAGNOSIS — J9690 Respiratory failure, unspecified, unspecified whether with hypoxia or hypercapnia: Secondary | ICD-10-CM | POA: Diagnosis not present

## 2021-11-25 DIAGNOSIS — G060 Intracranial abscess and granuloma: Secondary | ICD-10-CM | POA: Diagnosis not present

## 2021-11-25 DIAGNOSIS — J15212 Pneumonia due to Methicillin resistant Staphylococcus aureus: Secondary | ICD-10-CM | POA: Diagnosis not present

## 2021-11-25 LAB — CBC
HCT: 30.3 % — ABNORMAL LOW (ref 36.0–46.0)
Hemoglobin: 9.1 g/dL — ABNORMAL LOW (ref 12.0–15.0)
MCH: 28.1 pg (ref 26.0–34.0)
MCHC: 30 g/dL (ref 30.0–36.0)
MCV: 93.5 fL (ref 80.0–100.0)
Platelets: 298 10*3/uL (ref 150–400)
RBC: 3.24 MIL/uL — ABNORMAL LOW (ref 3.87–5.11)
RDW: 19 % — ABNORMAL HIGH (ref 11.5–15.5)
WBC: 10 10*3/uL (ref 4.0–10.5)
nRBC: 0 % (ref 0.0–0.2)

## 2021-11-25 LAB — GLUCOSE, CAPILLARY
Glucose-Capillary: 124 mg/dL — ABNORMAL HIGH (ref 70–99)
Glucose-Capillary: 138 mg/dL — ABNORMAL HIGH (ref 70–99)
Glucose-Capillary: 138 mg/dL — ABNORMAL HIGH (ref 70–99)
Glucose-Capillary: 166 mg/dL — ABNORMAL HIGH (ref 70–99)
Glucose-Capillary: 190 mg/dL — ABNORMAL HIGH (ref 70–99)
Glucose-Capillary: 237 mg/dL — ABNORMAL HIGH (ref 70–99)
Glucose-Capillary: 246 mg/dL — ABNORMAL HIGH (ref 70–99)
Glucose-Capillary: 59 mg/dL — ABNORMAL LOW (ref 70–99)
Glucose-Capillary: 67 mg/dL — ABNORMAL LOW (ref 70–99)
Glucose-Capillary: 94 mg/dL (ref 70–99)

## 2021-11-25 LAB — POCT I-STAT 7, (LYTES, BLD GAS, ICA,H+H)
Acid-Base Excess: 7 mmol/L — ABNORMAL HIGH (ref 0.0–2.0)
Bicarbonate: 31.9 mmol/L — ABNORMAL HIGH (ref 20.0–28.0)
Calcium, Ion: 1.15 mmol/L (ref 1.15–1.40)
HCT: 31 % — ABNORMAL LOW (ref 36.0–46.0)
Hemoglobin: 10.5 g/dL — ABNORMAL LOW (ref 12.0–15.0)
O2 Saturation: 98 %
Patient temperature: 99.1
Potassium: 4.4 mmol/L (ref 3.5–5.1)
Sodium: 137 mmol/L (ref 135–145)
TCO2: 33 mmol/L — ABNORMAL HIGH (ref 22–32)
pCO2 arterial: 46.2 mmHg (ref 32–48)
pH, Arterial: 7.448 (ref 7.35–7.45)
pO2, Arterial: 110 mmHg — ABNORMAL HIGH (ref 83–108)

## 2021-11-25 LAB — BASIC METABOLIC PANEL
Anion gap: 8 (ref 5–15)
BUN: 38 mg/dL — ABNORMAL HIGH (ref 6–20)
CO2: 32 mmol/L (ref 22–32)
Calcium: 8.2 mg/dL — ABNORMAL LOW (ref 8.9–10.3)
Chloride: 100 mmol/L (ref 98–111)
Creatinine, Ser: 0.8 mg/dL (ref 0.44–1.00)
GFR, Estimated: >60 mL/min (ref >60)
Glucose, Bld: 197 mg/dL — ABNORMAL HIGH (ref 70–99)
Potassium: 3.5 mmol/L (ref 3.5–5.1)
Sodium: 140 mmol/L (ref 135–145)

## 2021-11-25 LAB — HEPARIN LEVEL (UNFRACTIONATED): Heparin Unfractionated: 0.76 IU/mL — ABNORMAL HIGH (ref 0.30–0.70)

## 2021-11-25 MED ORDER — HEPARIN (PORCINE) 25000 UT/250ML-% IV SOLN
1800.0000 [IU]/h | INTRAVENOUS | Status: DC
  Administered 2021-11-25: 1000 [IU]/h via INTRAVENOUS
  Administered 2021-11-26: 1100 [IU]/h via INTRAVENOUS
  Administered 2021-11-27: 1200 [IU]/h via INTRAVENOUS
  Administered 2021-11-28: 1550 [IU]/h via INTRAVENOUS
  Filled 2021-11-25 (×3): qty 250

## 2021-11-25 MED ORDER — ATROPINE SULFATE 1 MG/10ML IJ SOSY
PREFILLED_SYRINGE | INTRAMUSCULAR | Status: AC
  Filled 2021-11-25: qty 10

## 2021-11-25 MED ORDER — FENTANYL 2500MCG IN NS 250ML (10MCG/ML) PREMIX INFUSION
0.0000 ug/h | INTRAVENOUS | Status: DC
  Administered 2021-11-25: 50 ug/h via INTRAVENOUS
  Administered 2021-11-26: 125 ug/h via INTRAVENOUS
  Administered 2021-11-26 – 2021-11-27 (×2): 50 ug/h via INTRAVENOUS
  Administered 2021-11-29: 25 ug/h via INTRAVENOUS
  Filled 2021-11-25 (×4): qty 250

## 2021-11-25 MED ORDER — DEXTROSE 50 % IV SOLN: 25.0000 mL | Freq: Once | INTRAVENOUS | Status: AC

## 2021-11-25 MED ORDER — DEXTROSE 50 % IV SOLN
INTRAVENOUS | Status: AC
Start: 2021-11-25 — End: 2021-11-25
  Administered 2021-11-25: 25 mL via INTRAVENOUS
  Filled 2021-11-25: qty 50

## 2021-11-25 MED ORDER — SODIUM CHLORIDE 0.9 % IV SOLN
250.0000 mL | INTRAVENOUS | Status: DC
  Administered 2021-11-25: 250 mL via INTRAVENOUS

## 2021-11-25 MED ORDER — POTASSIUM CHLORIDE 10 MEQ/100ML IV SOLN
10.0000 meq | INTRAVENOUS | Status: AC
  Administered 2021-11-25 (×4): 10 meq via INTRAVENOUS
  Filled 2021-11-25 (×4): qty 100

## 2021-11-25 MED ORDER — NOREPINEPHRINE 4 MG/250ML-% IV SOLN: 0.0000 ug/min | INTRAVENOUS | Status: DC

## 2021-11-25 MED ORDER — NOREPINEPHRINE 4 MG/250ML-% IV SOLN
2.0000 ug/min | INTRAVENOUS | Status: DC
  Administered 2021-11-25 (×2): 2 ug/min via INTRAVENOUS
  Filled 2021-11-25: qty 250

## 2021-11-25 NOTE — Progress Notes (Signed)
HR 44, intermittently junctional and SB after Precedex increased to 0.7 mcg's/kg/hr. BP stable with Levo at 2 mcg's. Dr Tamala Julian notified. May switch to Fentanyl drip. ?

## 2021-11-25 NOTE — Progress Notes (Signed)
Attempted to obtain consent for this patient with [name], daughter. Unable to reach at this time. ?

## 2021-11-25 NOTE — Progress Notes (Signed)
Peripherally Inserted Central Catheter Placement ? ?The IV Nurse has discussed with the patient and/or persons authorized to consent for the patient, the purpose of this procedure and the potential benefits and risks involved with this procedure.  The benefits include less needle sticks, lab draws from the catheter, and the patient may be discharged home with the catheter. Risks include, but not limited to, infection, bleeding, blood clot (thrombus formation), and puncture of an artery; nerve damage and irregular heartbeat and possibility to perform a PICC exchange if needed/ordered by physician.  Alternatives to this procedure were also discussed.  Bard Power PICC patient education guide, fact sheet on infection prevention and patient information card has been provided to patient /or left at bedside.  Telephone consent obtained per [name], son. ? ?PICC Placement Documentation  ?  ? ? ? ? ?Kristen Murphy  Kristen Murphy ?11/25/2021, 5:31 PM ? ?

## 2021-11-25 NOTE — Procedures (Signed)
Bronchoscopy Procedure Note ? ?Kristen Murphy  ?[id number]  ?Feb 14, 1966 ? ?Date:11/25/21  ?Time:11:25 AM  ? ?Provider Performing:Karas Pickerill  ? ?Procedure(s):  Flexible Bronchoscopy (33582) and Initial Therapeutic Aspiration of Tracheobronchial Tree (51898) ? ?Indication(s) ?Acute respiratory failure ? ?Consent ?Risks of the procedure as well as the alternatives and risks of each were explained to the patient and/or caregiver.  Consent for the procedure was obtained and is signed in the bedside chart ? ?Anesthesia ?Etomidate and Vecuronium ? ? ?Time Out ?Verified patient identification, verified procedure, site/side was marked, verified correct patient position, special equipment/implants available, medications/allergies/relevant history reviewed, required imaging and test results available. ? ? ?Sterile Technique ?Usual hand hygiene, masks, gowns, and gloves were used ? ? ?Procedure Description ?Bronchoscope advanced through tracheostomy tube and into airway.  Airways were examined down to subsegmental level with findings noted below.   ?Following diagnostic evaluation, Therapeutic aspiration performed in RML/RLL/LLL ? ?Findings: [place] amounts of thick secretions noted in all over bronchial tree, suction ? ? ?Complications/Tolerance ?None; patient tolerated the procedure well. ?Chest X-ray is needed post procedure. ? ? ?EBL ?Minimal ? ? ? ?

## 2021-11-25 NOTE — Progress Notes (Signed)
ANTICOAGULATION CONSULT NOTE - Initial Consult ? ?Pharmacy Consult for heparin ?Indication:  Upper Extremity Thrombus ? ?Allergies  ?Allergen Reactions  ? Glimepiride Anaphylaxis  ?  Has sulfa in it per pharmacy  ? Sulfa Antibiotics Anaphylaxis and Hives  ? Effexor [Venlafaxine] Hives  ? ? ?Patient Measurements: ?Height: '5\' 2"'$  (157.5 cm) ?Weight: 77.2 kg (170 lb 3.1 oz) ?IBW/kg (Calculated) : 50.1 ? ? ?Vital Signs: ?Temp: 99 ?F (37.2 ?C) (03/04 0000) ?Temp Source: Oral (03/04 0000) ?BP: 105/52 (03/04 0200) ?Pulse Rate: 70 (03/04 0200) ? ?Labs: ?Recent Labs  ?  11/23/21 ?0612 11/24/21 ?0141 11/25/21 ?0041  ?HGB 10.2* 10.3* 9.1*  ?HCT 33.9* 34.1* 30.3*  ?PLT 271 285 298  ?HEPARINUNFRC  --   --  0.76*  ?CREATININE 0.91 0.83 0.80  ? ? ? ?Estimated Creatinine Clearance: 75.5 mL/min (by C-G formula based on SCr of 0.8 mg/dL). ? ? ?Medical History: ?Past Medical History:  ?Diagnosis Date  ? Anemia   ? Anxiety   ? Arthritis   ? Asthma   ? Cervical cancer ([place])   ? Chronic kidney disease   ? Coronary artery disease   ? Depression   ? Diabetes mellitus without complication ([place])   ? Dyspnea   ? Headache   ? Heart murmur   ? Hx of adenomatous polyp of colon   ? Hypertension   ? Rectovaginal fistula   ? Seizures ([place])   ? Stercoral ulcer of rectum 12/2020  ? Stroke [facility])   ? ? ? ? ?Assessment: ?53yof with MRSA bacteremia, endocarditis, and brain abscess.  S/p VDRF and new UE thrombus > PICC removed and treated with enoxaparin '1mg'$ /kg q12h.  Planning trach over the weekend will change to heparin drip for easier on/off anticoagulation insetting of procedures.   ? ?Heparin level just above goal at 0.76. Hgb down slightly to 9.1, no bleeding issues noted.  ? ?Note patient for trach later today, heparin to be turned off at 0500.  ? ?Goal of Therapy:  ?Heparin level 0.3-0.7 units/ml ?Monitor platelets by anticoagulation protocol: Yes ?  ?Plan:  ?Decrease heparin to 900 units/hr  ?Heparin off at 0500 ?Daily heparin level and  CBC ?Monitor s/s bleeding  ? ?Erin Hearing PharmD., BCPS ?Clinical Pharmacist ?11/25/2021 3:52 AM ? ?

## 2021-11-25 NOTE — Progress Notes (Signed)
Spoke with Ute RN re PICC order.  States plan on trach at 1100.  Will secure chat PTA for PICC placement. ?

## 2021-11-25 NOTE — Consult Note (Signed)
Chief Complaint: Patient was seen in consultation today for percutaneous gastric tube placemrnt Chief Complaint  Patient presents with   Altered Mental Status   at the request of Dr Charlsie Quest  Supervising Physician: Juliet Rude  Patient Status: [facility] - In-pt  History of Present Illness: Kristen Murphy is a 56 y.o. female   Hx smoker Cervical cancer Nephrectomy and colostomy CAD; DM; HTN; remote CVA Hx substance abuse To ED with AMS and slurred speech- 11/12/21 High fever Hypoxic; developed PEA in ED---- intubation and CPR ROSC 2 minutes  Imaging revealing brain abscesses; 2/22: LP +bacterial meningitis and MRSA respiratory Neg for seizures Endocarditis Antibiotics for CSF and MRSA and Endocarditis Multiorgan failure and septicemia  Intubated /vent and Trach planned for today  Dr Tamala Julian note today: - PEG consult - After trach/PEG would switch to eliquis x 3 mo for provoked DVT - Post trach bundle and work toward [employer] - Updated son by phone that this will be a weeks to months process to see if any neuro recovery; if no recovery within a month or two family seem to be interested in discussing comfort care   Request made for percutaneous G tube placement in IR Imaging reviewed with Dr Carlyon Shadow He approves procedure but does say that ileostomy location may limit our ability to place  Percutaneous gastric tube in proper position  Scheduled for placement Monday 11/27/21 in IR  Past Medical History:  Diagnosis Date   Anemia    Anxiety    Arthritis    Asthma    Cervical cancer ([place])    Chronic kidney disease    Coronary artery disease    Depression    Diabetes mellitus without complication ([place])    Dyspnea    Headache    Heart murmur    Hx of adenomatous polyp of colon    Hypertension    Rectovaginal fistula    Seizures ([place])    Stercoral ulcer of rectum 12/2020   Stroke [facility])     Past Surgical History:  Procedure Laterality Date   CARDIAC CATHETERIZATION      COLONOSCOPY  12/2020   Subcentimeter adenoma, stercoral ulcer   COLOSTOMY  08/2021   CORONARY STENT INTERVENTION N/A 02/19/2019   Procedure: CORONARY STENT INTERVENTION;  Surgeon: Lorretta Harp, MD;  Location: [place] [facility];  Service: Cardiovascular;  Laterality: N/A;   ESOPHAGOGASTRODUODENOSCOPY  12/2020   Small GE junction ulcer and small hiatal hernia   ESOPHAGOGASTRODUODENOSCOPY N/A 11/12/2021   Procedure: ESOPHAGOGASTRODUODENOSCOPY (EGD);  Surgeon: Gatha Mayer, MD;  Location: [facility] ENDOSCOPY;  Service: Endoscopy;  Laterality: N/A;   LEFT HEART CATH AND CORONARY ANGIOGRAPHY N/A 02/19/2019   Procedure: LEFT HEART CATH AND CORONARY ANGIOGRAPHY;  Surgeon: Lorretta Harp, MD;  Location: [place] [facility];  Service: Cardiovascular;  Laterality: N/A;   NEPHRECTOMY Right 08/2021   TONSILLECTOMY      Allergies: Glimepiride, Sulfa antibiotics, and Effexor [venlafaxine]  Medications: Prior to Admission medications   Medication Sig Start Date End Date Taking? Authorizing Provider  albuterol (VENTOLIN HFA) 108 (90 Base) MCG/ACT inhaler Inhale 2 puffs into the lungs every 6 (six) hours as needed for wheezing or shortness of breath.   Yes [provider]  ALPRAZolam Duanne Moron) 1 MG tablet Take 1 mg by mouth 3 (three) times daily as needed for anxiety. 10/19/21  Yes [provider]  amLODipine (NORVASC) 10 MG tablet TAKE 1 TABLET BY MOUTH ONCE DAILY Patient taking differently: Take 10  mg by mouth daily. 04/12/20  Yes Tobb, Kardie, DO  Cyanocobalamin (VITAMIN B-12 PO) Take 1 tablet by mouth daily.   Yes [provider]  dapagliflozin propanediol (FARXIGA) 10 MG TABS tablet Take 10 mg by mouth daily.   Yes [provider]  gabapentin (NEURONTIN) 800 MG tablet Take 800 mg by mouth 3 (three) times daily. 10/19/21  Yes [provider]  lisinopril (ZESTRIL) 40 MG tablet Take 40 mg by mouth daily.   Yes [provider]  metoprolol succinate  (TOPROL-XL) 50 MG 24 hr tablet Take 50 mg by mouth 2 (two) times daily. 10/14/21  Yes [provider]  nitroGLYCERIN (NITROSTAT) 0.4 MG SL tablet Place 1 tablet (0.4 mg total) under the tongue every 5 (five) minutes x 3 doses as needed for chest pain. 02/20/19  Yes Reino Bellis B, NP  pantoprazole (PROTONIX) 40 MG tablet Take 40 mg by mouth 2 (two) times daily. 10/28/21  Yes [provider]  prochlorperazine (COMPAZINE) 10 MG tablet Take 10 mg by mouth every 6 (six) hours as needed for nausea.   Yes [provider]  sitaGLIPtin (JANUVIA) 50 MG tablet Take 50 mg by mouth daily.   Yes [provider]  aspirin 81 MG EC tablet Take 1 tablet (81 mg total) by mouth daily. Patient not taking: Reported on 11/13/2021 02/20/19   Reino Bellis B, NP  atorvastatin (LIPITOR) 80 MG tablet Take 1 tablet (80 mg total) by mouth daily at 6 PM. Patient not taking: Reported on 11/13/2021 02/20/19   Cheryln Manly, NP  metFORMIN (GLUCOPHAGE) 1000 MG tablet Take 1 tablet (1,000 mg total) by mouth 2 (two) times daily with a meal. Patient not taking: Reported on 11/13/2021 02/20/19   Reino Bellis B, NP  metoprolol tartrate (LOPRESSOR) 50 MG tablet Take 1 tablet (50 mg total) by mouth 2 (two) times daily. Patient not taking: Reported on 11/13/2021 02/20/19   Cheryln Manly, NP     Family History  Problem Relation Age of Onset   Hypertension Mother    Hyperlipidemia Mother    Hypertension Brother    Diabetes Maternal Uncle     Social History   Socioeconomic History   Marital status: Married    Spouse name: Not on file   Number of children: Not on file   Years of education: Not on file   Highest education level: Not on file  Occupational History   Not on file  Tobacco Use   Smoking status: Every Day    Packs/day: 3.00    Years: 38.00    Pack years: 114.00    Types: Cigarettes   Smokeless tobacco: Never  Vaping Use   Vaping Use: Some days  Substance and Sexual  Activity   Alcohol use: Not Currently   Drug use: Not Currently    Types: Cocaine, Benzodiazepines, Amphetamines, "Crack" cocaine, Marijuana, Opium, Methylphenidate, Heroin    Comment: recovering addict   Sexual activity: Not Currently  Other Topics Concern   Not on file  Social History Narrative   Patient lives in [place].  There is a history of polysubstance abuse.  Current status of that not known though suspected.   Social Determinants of Health   Financial Resource Strain: Not on file  Food Insecurity: Not on file  Transportation Needs: Not on file  Physical Activity: Not on file  Stress: Not on file  Social Connections: Not on file    Review of Systems: A 12 point ROS discussed and  pertinent positives are indicated in the HPI above.  All other systems are negative.  Vital Signs: BP (!) 107/49 (BP Location: Left Leg)    Pulse 71    Temp 98.2 F (36.8 C) (Axillary)    Resp (!) 28    Ht '5\' 2"'$  (1.575 m)    Wt 171 lb 15.3 oz (78 kg)    SpO2 97%    BMI 31.45 kg/m   Physical Exam Vitals reviewed.  Constitutional:      Comments: Sedated; intubated  Cardiovascular:     Rate and Rhythm: Normal rate and regular rhythm.  Skin:    General: Skin is warm.  Neurological:     Comments: No response    Imaging: CT HEAD WO CONTRAST (5MM)  Result Date: 11/20/2021 CLINICAL DATA:  Mental status change with unknown cause. None reactive left pupil EXAM: CT HEAD WITHOUT CONTRAST TECHNIQUE: Contiguous axial images were obtained from the base of the skull through the vertex without intravenous contrast. RADIATION DOSE REDUCTION: This exam was performed according to the departmental dose-optimization program which includes automated exposure control, adjustment of the mA and/or kV according to patient size and/or use of iterative reconstruction technique. COMPARISON:  Head CT from yesterday FINDINGS: Brain: Numerous brain lesions scattered in the brain, underestimated relative to prior MRI.  These have a similar distribution when compared to prior CT. Recent right parietooccipital infarcts without detected progression. Remote high left frontal infarct with dense gliosis. No acute hemorrhage, hydrocephalus, or collection. No herniation. Vascular: Atheromatous calcification. Skull: Normal. Negative for fracture or focal lesion. Sinuses/Orbits: Patchy sinus opacification and bilateral mastoid opacification in the setting of nasal intubation. No evidence of orbital inflammation or mass to explain changes. IMPRESSION: 1. No change from recent head CT to explain the new deficit. 2. Known numerous brain lesions and subacute right cerebral infarcts. Electronically Signed   By: Jorje Guild M.D.   On: 11/20/2021 10:58   CT HEAD WO CONTRAST (5MM)  Result Date: 11/19/2021 CLINICAL DATA:  Mental status change.  Unable to wake up. EXAM: CT HEAD WITHOUT CONTRAST TECHNIQUE: Contiguous axial images were obtained from the base of the skull through the vertex without intravenous contrast. RADIATION DOSE REDUCTION: This exam was performed according to the departmental dose-optimization program which includes automated exposure control, adjustment of the mA and/or kV according to patient size and/or use of iterative reconstruction technique. COMPARISON:  11/12/2021 FINDINGS: Brain: New area of hypoattenuation noted in the superior, posterior right frontal lobe extending to the adjacent right parietal lobe, consistent with a recent infarction. Hypoattenuation involving the superior, medial left frontal lobe is stable from the prior CT. There are several small rounded areas of hypoattenuation in the cerebellar hemispheres left basal ganglia and cerebral hemispheres not evident on the prior head CT, but consistent with a small lesions noted on the brain MRI from 11/14/2021. Ventricles are normal in size and configuration. No extra-axial masses or abnormal fluid collections. No intracranial hemorrhage. Vascular: No  hyperdense vessel or unexpected calcification. Skull: Normal. Negative for fracture or focal lesion. Sinuses/Orbits: Globes and orbits are unremarkable. There is sinus disease with a right maxillary sinus air-fluid level, mild right maxillary, right frontal, bilateral ethmoid and right sphenoid sinus mucosal thickening. Small amount of right sphenoid sinus dependent fluid. Right and left mastoid air cell fluid. Other: None. IMPRESSION: 1. Relatively well-defined focus of hypoattenuation in the superior, medial right frontal lobe and adjacent right parietal lobe, consistent with recent infarction, occurring since the brain MRI dated 11/14/2021.  2. Several small hypoattenuating lesions noted in the basal ganglia on the left, cerebral hemispheres and cerebellar hemispheres, corresponding to the lesions noted on the prior brain MRI. Differential diagnosis consistent with abscesses or metastatic disease. 3. Old left superior frontal lobe infarct stable from the prior head CT. Electronically Signed   By: Lajean Manes M.D.   On: 11/19/2021 14:32   CT HEAD WO CONTRAST (5MM)  Result Date: 11/12/2021 CLINICAL DATA:  Altered mental status following cardiac arrest, initial encounter EXAM: CT HEAD WITHOUT CONTRAST TECHNIQUE: Contiguous axial images were obtained from the base of the skull through the vertex without intravenous contrast. RADIATION DOSE REDUCTION: This exam was performed according to the departmental dose-optimization program which includes automated exposure control, adjustment of the mA and/or kV according to patient size and/or use of iterative reconstruction technique. COMPARISON:  CT from earlier in the same day. FINDINGS: Brain: No findings to suggest acute hemorrhage, acute infarction or space-occupying mass lesion are noted. 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 No new focal abnormality is seen. Vascular: No hyperdense vessel or unexpected calcification. Skull: Normal. Negative for fracture or focal  lesion. Sinuses/Orbits: No acute finding. Other: None. IMPRESSION: No acute abnormality noted. No significant change from the prior exam. Electronically Signed   By: Inez Catalina M.D.   On: 11/12/2021 19:14   CT CHEST WO CONTRAST  Result Date: 11/19/2021 CLINICAL DATA:  Increased Levophed requirement. Slow to wean off ventilator. Unable to wake up. EXAM: CT CHEST WITHOUT CONTRAST TECHNIQUE: Multidetector CT imaging of the chest was performed following the standard protocol without IV contrast. RADIATION DOSE REDUCTION: This exam was performed according to the departmental dose-optimization program which includes automated exposure control, adjustment of the mA and/or kV according to patient size and/or use of iterative reconstruction technique. COMPARISON:  11/12/2021. FINDINGS: Cardiovascular: Heart is normal in size and configuration. Dense three-vessel coronary artery calcifications. No pericardial effusion. Is great vessels are normal in caliber. Stable aortic atherosclerotic calcifications. Mediastinum/Nodes: Endotracheal tube tip lies at the level of the upper [place], above the origin of the right mainstem bronchus. Nasal/orogastric tube passes below the diaphragm well into the stomach, below the included field of view. No neck base masses or adenopathy. No mediastinal or hilar masses. Several prominent mediastinal lymph nodes, largest a subcarinal node, 1.4 cm in short axis. Nodes have increased compared to the prior CT. Trachea and esophagus are unremarkable. Lungs/Pleura: Numerous, small ill-defined nodular ground-glass opacities noted throughout both lungs, predominating in the upper lobes, not evident on the previous CT. Masslike area of opacity noted in the right lower lobe is not convincingly changed, but is now partly obscured by right lower lobe atelectasis. There is opacity in the left lower lobe with depression of the oblique fissure posteriorly, also consistent with atelectasis. A component of  this may reflect infection there is a small. Similar appearing area of opacity in the dependent left upper lobe lingula consistent with atelectasis and/or infection. Mild opacities noted in the anterior right middle lobe, new, consistent with infection and/or atelectasis. Trace left pleural fluid.  No pneumothorax. Upper Abdomen: Liver unremarkable. Subtle heterogeneous attenuation of the spleen with areas of relative decreased attenuation, nonspecific. Previous right nephrectomy. No acute findings. Musculoskeletal: No fracture or acute finding. No osteoblastic or osteolytic lesions. IMPRESSION: 1. Since the prior chest CT, numerous small ill-defined ground-glass nodular opacities have developed in both lungs. Multifocal infection is suspected. Findings could be inflammatory. 2. Bilateral lower lobe atelectasis, partly obscuring the right lower lobe cavitary  consolidative process noted on the prior CT, which otherwise has not changed. Lung base opacities have significantly increased from the previous CT. New trace left pleural effusion. Small areas of atelectasis or infection noted in the right middle lobe and left upper lobe lingula, also new. 3. Stable coronary artery calcifications and aortic atherosclerosis. 4. Prominent mediastinal lymph nodes increased from the prior CT, reactive. Aortic Atherosclerosis (ICD10-I70.0). Electronically Signed   By: Lajean Manes M.D.   On: 11/19/2021 14:43   CT Angio Chest PE W and/or Wo Contrast  Result Date: 11/12/2021 CLINICAL DATA:  Recent stroke-like symptoms, possible sepsis EXAM: CT ANGIOGRAPHY CHEST CT ABDOMEN AND PELVIS WITH CONTRAST TECHNIQUE: Multidetector CT imaging of the chest was performed using the standard protocol during bolus administration of intravenous contrast. Multiplanar CT image reconstructions and MIPs were obtained to evaluate the vascular anatomy. Multidetector CT imaging of the abdomen and pelvis was performed using the standard protocol during  bolus administration of intravenous contrast. RADIATION DOSE REDUCTION: This exam was performed according to the departmental dose-optimization program which includes automated exposure control, adjustment of the mA and/or kV according to patient size and/or use of iterative reconstruction technique. CONTRAST:  152m OMNIPAQUE IOHEXOL 350 MG/ML SOLN COMPARISON:  Chest x-ray from earlier in the same day, CT from 09/08/2021 FINDINGS: CTA CHEST FINDINGS Cardiovascular: Atherosclerotic calcifications are noted. No aneurysmal dilatation or dissection of the thoracic aorta is seen. No cardiac enlargement is noted. Scattered coronary calcifications are noted. The pulmonary artery shows a normal branching pattern. No filling defect to suggest pulmonary embolism is seen. 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 Mediastinum/Nodes: Thoracic inlet is within normal limits. Endotracheal tube is noted in satisfactory position. Gastric catheter extends into the stomach. The esophagus is within normal limits. No sizable hilar or mediastinal adenopathy is noted Lungs/Pleura: Lungs are well aerated bilaterally. Diffuse consolidation is noted in the medial aspect of the right lower lobe consistent with acute infiltrate. Some associated cavitary changes are noted. No sizable effusion is noted. No other focal infiltrate is noted. Mild left basilar atelectasis is seen. Well-formed bleb is noted in the medial aspect of the left lung base which was not present on the prior exam. Air-fluid level is noted within. Previously noted effusions bilaterally have resolved in the interval. Musculoskeletal: Degenerative changes of the thoracic spine are noted. No acute rib abnormality is seen. Review of the MIP images confirms the above findings. CT ABDOMEN and PELVIS FINDINGS Hepatobiliary: Gallbladder is decompressed. Liver demonstrates a normal enhancement pattern with the exception of a geographic area of decreased  attenuation in the lateral segment of the left lobe of the liver. This has a somewhat masslike appearance with hepatic contour change best seen on image number 26 of series 5. This was not present on the prior examination. Given the somewhat masslike density the possibility of a neoplastic disease could not be totally excluded given the patient's clinical history. Pancreas: Unremarkable. No pancreatic ductal dilatation or surrounding inflammatory changes. Spleen: Normal in size without focal abnormality. Adrenals/Urinary Tract: Adrenal glands are within normal limits. The left kidney demonstrates a normal enhancement pattern. No renal calculi or obstructive changes are noted. Right kidney has been surgically removed. Ureter is within normal limits. Bladder is incompletely distended. Stomach/Bowel: Hartmann's pouch is noted in the mid descending colon. Left upper quadrant colostomy is seen assistant with the patient's given clinical history. No obstructive or inflammatory changes are noted. The appendix is within normal limits. No small bowel or  gastric abnormality is noted. Vascular/Lymphatic: Aortic atherosclerosis. No enlarged abdominal or pelvic lymph nodes. Reproductive: Uterus and bilateral adnexa are unremarkable. Other: No abdominal wall hernia or abnormality. No abdominopelvic ascites. Musculoskeletal: No acute or significant osseous findings. Review of the MIP images confirms the above findings. IMPRESSION: CTA of the chest: No evidence of pulmonary emboli. Large mildly cavitary infiltrate in superior segment of right lower lobe consistent with acute pneumonia. Some minimal infiltrate is noted in the left base as well. Small bleb with air-fluid level within is noted in the medial aspect of the left base. These findings are all new from the prior exam. Resolution of previously seen effusions. 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 CT of the abdomen and pelvis: Postsurgical changes consistent  with colostomy. Area of decreased attenuation and enhancement within the lateral segment of the left lobe of the liver with a somewhat mass like appearance along the margin of the liver. Possible metastatic disease would deserve consideration. Status post right nephrectomy. Electronically Signed   By: Inez Catalina M.D.   On: 11/12/2021 19:33   MR BRAIN W WO CONTRAST  Result Date: 11/14/2021 CLINICAL DATA:  Mental status change.  Leukocytosis EXAM: MRI HEAD WITHOUT AND WITH CONTRAST TECHNIQUE: Multiplanar, multiecho pulse sequences of the brain and surrounding structures were obtained without and with intravenous contrast. CONTRAST:  8.65m GADAVIST GADOBUTROL 1 MMOL/ML IV SOLN COMPARISON:  CT head 11/12/2021 FINDINGS: Brain: Numerous small lesions are seen throughout the brain. These are best seen on diffusion-weighted imaging and show restricted diffusion. Some of the lesions show mild peripheral enhancement. Some of the lesions show mild surrounding edema on FLAIR. There are lesions in both cerebellar hemispheres bilaterally measuring under 1 cm. Numerous lesions are seen throughout both cerebral hemispheres. Most of lesions are near the gray-white junction but there also are lesions in the right thalamus and putamen bilaterally and in the splenium. There is also a linear area of restricted diffusion within the left lateral ventricle. These lesions are felt to be due to infection and brain abscesses. There is likely ventriculitis on the left. Many of the lesions show susceptibility compatible with small areas of hemorrhage Chronic infarct left frontal lobe. Chronic hemorrhage in the left frontal infarct. Ventricle size normal. No midline shift. Vascular: Normal arterial flow voids Skull and upper cervical spine: No focal lesion. Sinuses/Orbits: Extensive mucosal edema throughout the paranasal sinuses. Air-fluid level right maxillary sinus. Bilateral mastoid effusion. Negative orbit Other: None IMPRESSION:  Numerous lesions in the brain. There are lesions in the cerebellar hemispheres and in both cerebral hemispheres widely distributed. Greater than 50 lesions are identified. The lesions have a rounded appearance with restricted diffusion and mild enhancement. Many lesions show evidence of mild associated hemorrhage. Mild edema. Restricted diffusion in the left lateral ventricle likely due to ventriculitis. 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 Differential diagnosis includes metastatic disease and demyelinating disease. Correlate with lumbar puncture results. These results were called by telephone at the time of interpretation on 11/14/2021 at 4:46 pm to provider CHI ELLISON , who verbally acknowledged these results. Electronically Signed   By: CFranchot GalloM.D.   On: 11/14/2021 16:48   CT ABDOMEN PELVIS W CONTRAST  Result Date: 11/12/2021 CLINICAL DATA:  Recent stroke-like symptoms, possible sepsis EXAM: CT ANGIOGRAPHY CHEST CT ABDOMEN AND PELVIS WITH CONTRAST TECHNIQUE: Multidetector CT imaging of the chest was performed using the standard protocol during bolus administration of intravenous  contrast. Multiplanar CT image reconstructions and MIPs were obtained to evaluate the vascular anatomy. Multidetector CT imaging of the abdomen and pelvis was performed using the standard protocol during bolus administration of intravenous contrast. RADIATION DOSE REDUCTION: This exam was performed according to the departmental dose-optimization program which includes automated exposure control, adjustment of the mA and/or kV according to patient size and/or use of iterative reconstruction technique. CONTRAST:  146m OMNIPAQUE IOHEXOL 350 MG/ML SOLN COMPARISON:  Chest x-ray from earlier in the same day, CT from 09/08/2021 FINDINGS: CTA CHEST FINDINGS Cardiovascular: Atherosclerotic calcifications are noted. No aneurysmal dilatation or dissection of the thoracic  aorta is seen. No cardiac enlargement is noted. Scattered coronary calcifications are noted. The pulmonary artery shows a normal branching pattern. No filling defect to suggest pulmonary embolism is seen. 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 Mediastinum/Nodes: Thoracic inlet is within normal limits. Endotracheal tube is noted in satisfactory position. Gastric catheter extends into the stomach. The esophagus is within normal limits. No sizable hilar or mediastinal adenopathy is noted Lungs/Pleura: Lungs are well aerated bilaterally. Diffuse consolidation is noted in the medial aspect of the right lower lobe consistent with acute infiltrate. Some associated cavitary changes are noted. No sizable effusion is noted. No other focal infiltrate is noted. Mild left basilar atelectasis is seen. Well-formed bleb is noted in the medial aspect of the left lung base which was not present on the prior exam. Air-fluid level is noted within. Previously noted effusions bilaterally have resolved in the interval. Musculoskeletal: Degenerative changes of the thoracic spine are noted. No acute rib abnormality is seen. Review of the MIP images confirms the above findings. CT ABDOMEN and PELVIS FINDINGS Hepatobiliary: Gallbladder is decompressed. Liver demonstrates a normal enhancement pattern with the exception of a geographic area of decreased attenuation in the lateral segment of the left lobe of the liver. This has a somewhat masslike appearance with hepatic contour change best seen on image number 26 of series 5. This was not present on the prior examination. Given the somewhat masslike density the possibility of a neoplastic disease could not be totally excluded given the patient's clinical history. Pancreas: Unremarkable. No pancreatic ductal dilatation or surrounding inflammatory changes. Spleen: Normal in size without focal abnormality. Adrenals/Urinary Tract: Adrenal glands are within normal  limits. The left kidney demonstrates a normal enhancement pattern. No renal calculi or obstructive changes are noted. Right kidney has been surgically removed. Ureter is within normal limits. Bladder is incompletely distended. Stomach/Bowel: Hartmann's pouch is noted in the mid descending colon. Left upper quadrant colostomy is seen assistant with the patient's given clinical history. No obstructive or inflammatory changes are noted. The appendix is within normal limits. No small bowel or gastric abnormality is noted. Vascular/Lymphatic: Aortic atherosclerosis. No enlarged abdominal or pelvic lymph nodes. Reproductive: Uterus and bilateral adnexa are unremarkable. Other: No abdominal wall hernia or abnormality. No abdominopelvic ascites. Musculoskeletal: No acute or significant osseous findings. Review of the MIP images confirms the above findings. IMPRESSION: CTA of the chest: No evidence of pulmonary emboli. Large mildly cavitary infiltrate in superior segment of right lower lobe consistent with acute pneumonia. Some minimal infiltrate is noted in the left base as well. Small bleb with air-fluid level within is noted in the medial aspect of the left base. These findings are all new from the prior exam. Resolution of previously seen effusions. 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 CT of the abdomen and pelvis: Postsurgical changes consistent with colostomy.  Area of decreased attenuation and enhancement within the lateral segment of the left lobe of the liver with a somewhat mass like appearance along the margin of the liver. Possible metastatic disease would deserve consideration. Status post right nephrectomy. Electronically Signed   By: Inez Catalina M.D.   On: 11/12/2021 19:33   MR LIVER W WO CONTRAST  Result Date: 11/18/2021 CLINICAL DATA:  Rule out metastatic disease or abscess in liver EXAM: MRI ABDOMEN WITHOUT AND WITH CONTRAST TECHNIQUE: Multiplanar multisequence MR imaging of the abdomen  was performed both before and after the administration of intravenous contrast. CONTRAST:  72m GADAVIST GADOBUTROL 1 MMOL/ML IV SOLN COMPARISON:  CT 11/12/2021 FINDINGS: Lower chest: Dense consolidation in the RIGHT lower lobe increased from comparison exam. Hepatobiliary: No intrahepatic biliary duct dilatation. The gallbladder is distended distended to 4.2 cm. No gallstones evident. Common bile duct normal caliber. No lesion identified in the LEFT hepatic lobe along the falciform ligament site of concern on comparison CT. Postcontrast enhanced imaging demonstrates some hyper perfusion to the inferior RIGHT hepatic lobe (image 86/series 1200). This persist on the more delayed imaging. Pancreas: Normal pancreatic parenchymal intensity. No ductal dilatation or inflammation. Spleen: There is persistent hyper perfusion to peripheral regions of the spleen (image 26/series 12004. This persists on the more delayed sequences. Adrenal glands normal.  Solitary LEFT kidney noted. Adrenals/urinary tract: Adrenal glands normal.  Solitary LEFT kidney Stomach/Bowel: Stomach normal.  LEFT abdominal wall ostomy Vascular/Lymphatic: Abdominal aortic normal caliber. No retroperitoneal periportal lymphadenopathy. Musculoskeletal: No aggressive osseous lesion IMPRESSION: 1. Interval increase in dense atelectasis/consolidation of the RIGHT lower lobe. 2. No worrisome lesions within the liver. Hypoperfusion to the subcapsular RIGHT hepatic lobe is favored benign vascular phenomena. 3. Distended gallbladder may relate to fasting state. 4. Persistent region of hypoperfusion to the spleen suggests splenic infarctions. 5. Solitary LEFT kidney without complication. Electronically Signed   By: SSuzy BouchardM.D.   On: 11/18/2021 20:06   DG CHEST PORT 1 VIEW  Result Date: 11/23/2021 CLINICAL DATA:  Hypoxia, brain abscess EXAM: PORTABLE CHEST 1 VIEW COMPARISON:  11/20/2021 FINDINGS: Single frontal view of the chest demonstrates endotracheal  tube overlying tracheal air column tip just below thoracic inlet. Enteric catheter passes below diaphragm tip excluded by collimation. Interval removal of the left subclavian catheter. Cardiac silhouette is unremarkable. Mild increased density at the left lung base may reflect hypoventilatory change or developing effusion. The faint ground-glass nodularity seen on recent CT is less apparent by radiograph. No new areas of consolidation. No pneumothorax. No acute bony abnormalities. IMPRESSION: 1. Support devices as above. 2. The faint nodular ground-glass opacity seen on recent CT are less apparent by radiograph. 3. Retrocardiac density most consistent with atelectasis. Electronically Signed   By: MRanda NgoM.D.   On: 11/23/2021 17:50   DG CHEST PORT 1 VIEW  Result Date: 11/20/2021 CLINICAL DATA:  Endotracheal tube present. Altered mental status. Hypoxemia. EXAM: PORTABLE CHEST 1 VIEW COMPARISON:  AP chest 11/16/2021, CT chest 11/19/2021 and 11/12/2021 FINDINGS: The endotracheal tube tip terminates approximately 3.2 cm above the carina. Left subclavian approach central venous catheter tip overlies the superior vena cava/right atrial junction unchanged. Enteric tube descends below the diaphragm with the tip excluded by collimation. Improved aeration of the left lower lung with resolution of the prior atelectasis versus pneumonia. Resolution of the small left pleural effusion. There is increased opacification around the right hilum compared to AP 11/16/2021 radiograph, which may reflect the atelectasis around the known cavitary lesion seen  on yesterday's 11/19/2021 chest CT within the medial right lower lobe. Cardiac silhouette and mediastinal contours are within normal limits with mild calcification within aortic arch. No acute skeletal abnormality. IMPRESSION:: IMPRESSION: 1. Endotracheal tube in appropriate position. 2. Heterogeneous opacification right hilum corresponding to the atelectasis and cavitary  lesion within the medial right lower lobe seen on yesterday's chest CT. 3. No definite pleural effusion is seen. Electronically Signed   By: Yvonne Kendall M.D.   On: 11/20/2021 08:41   DG CHEST PORT 1 VIEW  Result Date: 11/16/2021 CLINICAL DATA:  Brain abscess, altered mental status, hypoxemia, MRSA, cervical cancer, asthma, hypertension EXAM: PORTABLE CHEST 1 VIEW COMPARISON:  Portable exam 0728 hours compared to 11/13/2021 FINDINGS: Tip of endotracheal tube projects 4.8 cm above carina. Feeding tube extends into stomach. LEFT subclavian central venous catheter tip projects over SVC. Normal heart size and mediastinal contours. Atherosclerotic calcifications aorta. Small LEFT pleural effusion and mild LEFT basilar atelectasis. Remaining lungs clear. No pneumothorax. IMPRESSION: Small LEFT pleural effusion and LEFT basilar atelectasis. Aortic Atherosclerosis (ICD10-I70.0). Electronically Signed   By: Lavonia Dana M.D.   On: 11/16/2021 08:28   Portable Chest xray  Result Date: 11/13/2021 CLINICAL DATA:  A 56 year old female presents for evaluation of respiratory failure. EXAM: PORTABLE CHEST 1 VIEW COMPARISON:  Comparison is made with November 12, 2021. FINDINGS: Pacer defibrillator pad projects over the central chest. EKG leads project over the chest and upper abdomen. LEFT-sided central venous catheter terminates at the caval to atrial junction. Endotracheal tube approximately 3.9 cm from the carina. Cardiomediastinal contours and hilar structures are stable. Added density about the RIGHT hilum compatible with known RIGHT lower lobe perihilar masslike area seen on recent imaging without gross change. No additional areas of consolidation or masslike appearance. No pneumothorax. On limited assessment there is no acute skeletal process. IMPRESSION: 1. Endotracheal tube approximately 3.9 cm from the carina. 2. No substantial change since recent imaging. 3. RIGHT perihilar masslike area may represent necrotic  pneumonia, area without abnormality in September of 2022. Possibility of neoplasm is considered in this patient with history of cervical cancer. Short interval follow-up after therapy is suggested. Pulmonary consultation may be helpful if not yet obtained. Electronically Signed   By: Zetta Bills M.D.   On: 11/13/2021 08:04   DG CHEST PORT 1 VIEW  Result Date: 11/12/2021 CLINICAL DATA:  Central line placement. EXAM: PORTABLE CHEST 1 VIEW COMPARISON:  Radiograph earlier today. FINDINGS: New left subclavian central venous catheter tip overlies the lower SVC. No pneumothorax. Endotracheal tube and enteric tube remain in place. Stable heart size. Right perihilar masslike opacity characterized on CT earlier today. There is mild background bronchial thickening. No significant pleural effusion. IMPRESSION: 1. New left subclavian central venous catheter with tip overlying the lower SVC. No pneumothorax. 2. Right perihilar masslike opacity, characterized on CT earlier today. Electronically Signed   By: Keith Rake M.D.   On: 11/12/2021 21:02   DG Chest Portable 1 View  Result Date: 11/12/2021 CLINICAL DATA:  ng/ett placement EXAM: PORTABLE CHEST 1 VIEW COMPARISON:  Same day chest radiograph. FINDINGS: Masslike prominence of the right hilum persists. Cardiac silhouette is mildly enlarged. No consolidation. No visible pleural effusions or pneumothorax on this semi upright AP radiograph. IMPRESSION: 1. Masslike prominence of the right hilum persists. Recommend CT chest (preferably with contrast) when able to exclude adenopathy and/or mass. 2. Endotracheal tube tip approximately 2.3 cm above the carina. 3. Gastric tube courses below the diaphragm with the tip outside  the field of view. Electronically Signed   By: Margaretha Sheffield M.D.   On: 11/12/2021 18:27   DG Chest Port 1 View  Result Date: 11/12/2021 CLINICAL DATA:  Questionable sepsis.  Right-sided weakness. EXAM: PORTABLE CHEST 1 VIEW COMPARISON:   04/17/2020.  Chest CT 06/16/2021 FINDINGS: Mild cardiomegaly. Aortic atherosclerosis. No evidence of consolidation, collapse or effusion. Mild hilar prominence right more than left felt most likely secondary to the portable AP technique and poor inspiration. Additional chest radiographic follow-up suggested when the patient is able to have a standard two view exam to exclude right hilar prominence. IMPRESSION: Cardiomegaly.  Aortic atherosclerosis. Technical limitations of poor inspiration and portable AP technique. Recommend follow-up two-view chest radiography when able to exclude right hilar mass. Electronically Signed   By: Nelson Chimes M.D.   On: 11/12/2021 16:08   DG Abd Portable 1V  Result Date: 11/15/2021 CLINICAL DATA:  Encounter for feeding tube placement EXAM: PORTABLE ABDOMEN - 1 VIEW COMPARISON:  None. FINDINGS: Enteric feeding tube tip overlies the distal stomach. Paucity of bowel gas. IMPRESSION: Enteric tube within the distal stomach. Electronically Signed   By: Macy Mis M.D.   On: 11/15/2021 11:13   EEG adult  Result Date: 11/16/2021 Lora Havens, MD     11/17/2021  9:44 AM Patient Name: Kristen Murphy MRN: [id number] Epilepsy Attending: Lora Havens Referring Physician/Provider: Kerney Elbe, MD Date: 11/16/2021 Duration: 21.48 mins  Patient history: 56 year old woman with a history of cervical cancer, status post nephrectomy 2 months ago, colostomy, CAD, diabetes, depression, hypertension, remote history of stroke without residual deficits who was brought in by EMS for altered mental status.  EEG to evaluate for seizure.  Level of alertness: obtunded  AEDs during EEG study: propofol, LEV  Technical aspects: This EEG study was done with scalp electrodes positioned according to the 10-20 International system of electrode placement. Electrical activity was acquired at a sampling rate of '500Hz'$  and reviewed with a high frequency filter of '70Hz'$  and a low frequency filter of '1Hz'$ .  EEG data were recorded continuously and digitally stored.  Description: EEG showed continuous generalized and lateralized right hemisphere low amplitude 3 to 6 Hz theta-delta slowing.  Frequent spikes were noted in left frontotemporal region, at times quasiperiodic every 3 to 6 seconds.  Sharp transients were also noted in right frontotemporal region.  Hyperventilation and photic stimulation were not performed.    ABNORMALITY -Spike, left frontotemporal region -Continuous slow, generalized and lateralized right hemisphere  IMPRESSION: This study showed evidence of epileptogenicity arising from left frontotemporal region.  Additionally study is suggestive of severe diffuse encephalopathy, nonspecific etiology.  No seizures were seen throughout the recording. Lora Havens   EEG adult  Result Date: 11/13/2021 Lora Havens, MD     11/13/2021  1:18 PM Patient Name: Kristen Murphy MRN: [id number] Epilepsy Attending: Lora Havens Referring Physician/Provider: Kerney Elbe, MD Date: 11/13/2021 Duration: 22.22 mins Patient history: 56 year old woman with a history of cervical cancer, status post nephrectomy 2 months ago, colostomy, CAD, diabetes, depression, hypertension, remote history of stroke without residual deficits who was brought in by EMS for altered mental status.  EEG to evaluate for seizure. Level of alertness: obtunded AEDs during EEG study: versed, propofol, LEV Technical aspects: This EEG study was done with scalp electrodes positioned according to the 10-20 International system of electrode placement. Electrical activity was acquired at a sampling rate of '500Hz'$  and reviewed with a high frequency filter of '70Hz'$  and a  low frequency filter of '1Hz'$ . EEG data were recorded continuously and digitally stored. Description: EEG showed continuous generalized background attenuation.  Hyperventilation and photic stimulation were not performed.   Of note, study was technically difficult due to significant  myogenic artifact ABNORMALITY -Background attenuation, generalized IMPRESSION: This technically difficult study is suggestive of profound degree of encephalopathy, nonspecific etiology.  No seizures or epileptiform discharges were seen throughout the recording. Priyanka [name]   Overnight EEG with video  Result Date: 11/21/2021 Lora Havens, MD     11/22/2021  9:37 AM Patient Name: Kristen Murphy MRN: [id number] Epilepsy Attending: Lora Havens Referring Physician/Provider: Donnetta Simpers, MD Duration: 11/20/2021 3329 to 11/21/2021 5188  Patient history: 56 year old woman with a history of cervical cancer, status post nephrectomy 2 months ago, colostomy, CAD, diabetes, depression, hypertension, remote history of stroke without residual deficits who was brought in by EMS for altered mental status.  EEG to evaluate for seizure.  Level of alertness: lethargic  AEDs during EEG study: LEV, Vimpat  Technical aspects: This EEG study was done with scalp electrodes positioned according to the 10-20 International system of electrode placement. Electrical activity was acquired at a sampling rate of '500Hz'$  and reviewed with a high frequency filter of '70Hz'$  and a low frequency filter of '1Hz'$ . EEG data were recorded continuously and digitally stored.  Description: EEG showed continuous generalized 3 to 6 Hz theta-delta slowing. EEG also showed generalized periodic discharges at 0.5-'1hz'$ , more prominent when awake/stimulated hyperventilation and photic stimulation were not performed.    Patient event button was pressed on 11/20/2021 at 1620, 156 [id number] and on 11/18/2021 0044 for generalized twitching and eyes rolled back. Concomitant EEG before, during and after the event showed generalized periodic discharges at 0.5-'1hz'$ . However, EEG interpretation was limited due to significant movement artifact.  ABNORMALITY - Periodic discharges, generalized - Continuous slow, generalized  IMPRESSION: This study showed evidence of  epileptogenicity with generalized onset, more prominent when awake/stimulated.  This EEG pattern can also be seen due to toxic-metabolic causes like cefepime toxicity, anoxic/hypoxic brain injury.  Additionally there is moderate to severe diffuse encephalopathy, nonspecific etiology.  Event button was pressed on 11/20/2021 at 1620, 156 1702 and on 11/18/2021 0044 for generalized twitching and eyes rolled back. Concomitant EEG showed generalized periodic discharges.  However, there was significant movement artifact.  Therefore clinical correlation is recommended.  Priyanka [name]   Overnight EEG with video  Result Date: 11/17/2021 Lora Havens, MD     11/18/2021  7:19 AM Patient Name: Kristen Murphy MRN: [id number] Epilepsy Attending: Lora Havens Referring Physician/Provider: Kerney Elbe, MD Duration: 11/16/2021 1127 to 11/17/2021 1127  Patient history: 56 year old woman with a history of cervical cancer, status post nephrectomy 2 months ago, colostomy, CAD, diabetes, depression, hypertension, remote history of stroke without residual deficits who was brought in by EMS for altered mental status.  EEG to evaluate for seizure.  Level of alertness: obtunded  AEDs during EEG study: propofol, LEV  Technical aspects: This EEG study was done with scalp electrodes positioned according to the 10-20 International system of electrode placement. Electrical activity was acquired at a sampling rate of '500Hz'$  and reviewed with a high frequency filter of '70Hz'$  and a low frequency filter of '1Hz'$ . EEG data were recorded continuously and digitally stored.  Description: EEG showed continuous generalized and lateralized right hemisphere low amplitude 3 to 6 Hz theta-delta slowing.  Generalized periodic discharges with triphasic morphology were noted every 3 to 4 seconds.  Hyperventilation and photic stimulation were not performed.   Patient event button was pressed on 11/17/2021 at 0953 for unclear reasons.  Concomitant EEG  before, during and after the event did not show any EEG to suggest seizure.  ABNORMALITY -Periodic discharges with triphasic morphology, generalized -Continuous slow, generalized and lateralized right hemisphere  IMPRESSION: This study showed generalized periodic discharges with triphasic morphology every 3 to 4 seconds.  This EEG pattern is on the ictal-interictal continuum with low potential for seizures.  It can also be seen due to toxic-metabolic causes like cefepime toxicity, hyperammonemia, postcardiac arrest.  Additionally study is suggestive of cortical dysfunction in right hemisphere likely secondary to underlying structural abnormality as well as moderate to severe diffuse encephalopathy, nonspecific etiology.  No seizures were seen throughout the recording. Patient event button was pressed on 11/17/2021 at 0953 for unclear reasons without concomitant EEG change.  This was most likely not an epileptic event.  Lora Havens   ECHOCARDIOGRAM COMPLETE  Result Date: 11/13/2021    ECHOCARDIOGRAM REPORT   Patient Name:   TEKELA GARGUILO Date of Exam: 11/13/2021 Medical Rec #:  [id number]       Height:       62.0 in Accession #:    [id number]      Weight:       166.7 lb Date of Birth:  Dec 22, 1965       BSA:          1.769 m Patient Age:    15 years        BP:           103/60 mmHg Patient Gender: F               HR:           88 bpm. Exam Location:  Inpatient Procedure: 2D Echo, Cardiac Doppler and Color Doppler Indications:    Cardiac arrest  History:        Patient has prior history of Echocardiogram examinations, most                 recent 02/19/2019. CAD, Stroke, Signs/Symptoms:Murmur and                 Dyspnea; Risk Factors:Hypertension and Diabetes. 02/19/2019 cath.  Sonographer:    Luisa Hart RDCS Referring Phys: [place]  1. Left ventricular ejection fraction, by estimation, is 65 to 70%. The left ventricle has normal function. The left ventricle has no regional wall motion  abnormalities. Left ventricular diastolic parameters are consistent with Grade I diastolic dysfunction (impaired relaxation).  2. Right ventricular systolic function is normal. The right ventricular size is normal.  3. The mitral valve is normal in structure. No evidence of mitral valve regurgitation. Mild to moderate mitral stenosis.  4. The aortic valve is normal in structure. Aortic valve regurgitation is not visualized. Mild aortic valve stenosis. FINDINGS  Left Ventricle: Left ventricular ejection fraction, by estimation, is 65 to 70%. The left ventricle has normal function. The left ventricle has no regional wall motion abnormalities. The left ventricular internal cavity size was small. There is no left ventricular hypertrophy. Left ventricular diastolic parameters are consistent with Grade I diastolic dysfunction (impaired relaxation). Right Ventricle: The right ventricular size is normal. Right vetricular wall thickness was not well visualized. Right ventricular systolic function is normal. Left Atrium: Left atrial size was normal in size. Right Atrium: Right atrial size was normal in size. Pericardium: There is no evidence  of pericardial effusion. Mitral Valve: The mitral valve is normal in structure. No evidence of mitral valve regurgitation. Mild to moderate mitral valve stenosis. MV peak gradient, 10.8 mmHg. The mean mitral valve gradient is 6.0 mmHg. Tricuspid Valve: The tricuspid valve is normal in structure. Tricuspid valve regurgitation is mild. Aortic Valve: The aortic valve is normal in structure. Aortic valve regurgitation is not visualized. Mild aortic stenosis is present. Aortic valve mean gradient measures 10.0 mmHg. Aortic valve peak gradient measures 17.8 mmHg. Aortic valve area, by VTI measures 1.62 cm. Pulmonic Valve: The pulmonic valve was normal in structure. Pulmonic valve regurgitation is not visualized. Aorta: The aortic root and ascending aorta are structurally normal, with no  evidence of dilitation. IAS/Shunts: The atrial septum is grossly normal.  LEFT VENTRICLE PLAX 2D LVIDd:         3.70 cm     Diastology LVIDs:         2.40 cm     LV e' medial:    4.46 cm/s LV PW:         1.20 cm     LV E/e' medial:  19.3 LV IVS:        1.00 cm     LV e' lateral:   6.64 cm/s LVOT diam:     2.10 cm     LV E/e' lateral: 13.0 LV SV:         56 LV SV Index:   32 LVOT Area:     3.46 cm  LV Volumes (MOD) LV vol d, MOD A4C: 50.1 ml LV vol s, MOD A4C: 14.3 ml LV SV MOD A4C:     50.1 ml RIGHT VENTRICLE RV S prime:     19.60 cm/s TAPSE (M-mode): 2.4 cm LEFT ATRIUM             Index LA diam:        4.00 cm 2.26 cm/m LA Vol (A2C):   33.2 ml 18.77 ml/m LA Vol (A4C):   50.8 ml 28.71 ml/m LA Biplane Vol: 42.2 ml 23.85 ml/m  AORTIC VALVE                     PULMONIC VALVE AV Area (Vmax):    1.74 cm      PV Vmax:       1.36 m/s AV Area (Vmean):   1.63 cm      PV Vmean:      87.750 cm/s AV Area (VTI):     1.62 cm      PV VTI:        0.215 m AV Vmax:           211.00 cm/s   PV Peak grad:  7.3 mmHg AV Vmean:          144.000 cm/s  PV Mean grad:  4.0 mmHg AV VTI:            0.344 m AV Peak Grad:      17.8 mmHg AV Mean Grad:      10.0 mmHg LVOT Vmax:         106.00 cm/s LVOT Vmean:        67.700 cm/s LVOT VTI:          0.161 m LVOT/AV VTI ratio: 0.47  AORTA Ao Asc diam: 3.10 cm MITRAL VALVE                TRICUSPID VALVE MV Area (PHT): 2.95 cm  TR Peak grad:   12.8 mmHg MV Area VTI:   1.13 cm     TR Vmax:        179.00 cm/s MV Peak grad:  10.8 mmHg MV Mean grad:  6.0 mmHg     SHUNTS MV Vmax:       1.64 m/s     Systemic VTI:  0.16 m MV Vmean:      112.0 cm/s   Systemic Diam: 2.10 cm MV Decel Time: 257 msec MV E velocity: 86.10 cm/s MV A velocity: 117.00 cm/s MV E/A ratio:  0.74 Mertie Moores MD Electronically signed by Mertie Moores MD Signature Date/Time: 11/13/2021/12:23:16 PM    Final    ECHO TEE  Result Date: 11/23/2021    TRANSESOPHOGEAL ECHO REPORT   Patient Name:   KIERAH GOATLEY Date of Exam:  11/23/2021 Medical Rec #:  [id number]       Height:       62.0 in Accession #:    [id number]      Weight:       175.5 lb Date of Birth:  December 21, 1965       BSA:          1.808 m Patient Age:    8 years        BP:           109/59 mmHg Patient Gender: F               HR:           75 bpm. Exam Location:  Inpatient Procedure: Transesophageal Echo, Cardiac Doppler, Color Doppler and 3D Echo Indications:     I48.0 Paroxysmal atrial fibrillation  History:         Patient has prior history of Echocardiogram examinations, most                  recent 11/13/2021. CAD, Stroke, Signs/Symptoms:Murmur and                  Dyspnea; Risk Factors:Hypertension and Diabetes.  Sonographer:     Darlina Sicilian RDCS Referring Phys:  [id number] Estill Cotta Diagnosing Phys: Gwyndolyn Kaufman MD PROCEDURE: After discussion of the risks and benefits of a TEE, an informed consent was obtained from the patient. TEE procedure time was 11 minutes. The transesophogeal probe was passed without difficulty through the esophogus of the patient. Imaged were obtained with the patient in a left lateral decubitus position. Sedation performed by different physician. The patient was monitored while under deep sedation. Image quality was good. The patient's vital signs; including heart rate, blood pressure, and oxygen saturation; remained stable throughout the procedure. The patient developed no complications during the procedure. IMPRESSIONS  1. Study terminated early as patient became hypoxic on the vent.  2. The mitral valve is thickened and mildly calcified. Leaflet motion is mildly restricted without significant stenosis (mean gradient 9mHg at HR 70bpm). There are mobile elements visualized on the anterior mitral valve leaflet which could represent possible vegetations given clinical picture although may also be redundant mitral valve tissue vs prior endocarditis. There is mild-to-moderate mitral regurgitation.  3. Left ventricular ejection fraction, by  estimation, is 60 to 65%. The left ventricle has normal function.  4. Right ventricular systolic function is normal. The right ventricular size is normal.  5. No left atrial/left atrial appendage thrombus was detected.  6. The aortic valve is tricuspid. There is mild thickening of the aortic valve. Aortic valve regurgitation is not  visualized. Aortic valve sclerosis is present, with no evidence of aortic valve stenosis.  7. No other valvular vegetations visualized. FINDINGS  Left Ventricle: Left ventricular ejection fraction, by estimation, is 60 to 65%. The left ventricle has normal function. The left ventricular internal cavity size was normal in size. Right Ventricle: The right ventricular size is normal. No increase in right ventricular wall thickness. Right ventricular systolic function is normal. Left Atrium: Left atrial size was normal in size. No left atrial/left atrial appendage thrombus was detected. Right Atrium: Right atrial size was normal in size. Pericardium: There is no evidence of pericardial effusion. Mitral Valve: The mitral valve is thickened and mildly calcified. Leaflet motion is mildly restricted without significant stenosis (mean graidnet 76mHg at HR 70bpm). The is a small mobile denisty on the anterior mitral valve leaflet (best seen on clip 56) that could represent valvular vegetation (versus possible prior endocarditis or redundant mitral valve tissue). There is mild-to-moderate mitral regurgitation. The mitral valve is abnormal. There is mild thickening of the mitral valve leaflet(s). There is mild calcification of the mitral valve leaflet(s). Mild to moderate mitral annular calcification. Mild to moderate mitral valve regurgitation. MV peak gradient, 5.0 mmHg. The mean mitral valve gradient is 2.0 mmHg. Tricuspid Valve: The tricuspid valve is normal in structure. Tricuspid valve regurgitation is trivial. There is no evidence of tricuspid valve vegetation. Aortic Valve: The aortic valve  is tricuspid. There is mild thickening of the aortic valve. Aortic valve regurgitation is not visualized. Aortic valve sclerosis is present, with no evidence of aortic valve stenosis. There is no evidence of aortic valve vegetation. Pulmonic Valve: The pulmonic valve was normal in structure. Pulmonic valve regurgitation is trivial. There is no evidence of pulmonic valve vegetation. Aorta: The aortic root is normal in size and structure. IAS/Shunts: No atrial level shunt detected by color flow Doppler.  LEFT VENTRICLE PLAX 2D LVOT diam:     1.90 cm LVOT Area:     2.84 cm  MITRAL VALVE MV Peak grad: 5.0 mmHg        SHUNTS MV Mean grad: 2.0 mmHg        Systemic Diam: 1.90 cm MV Vmax:      1.12 m/s MV Vmean:     61.8 cm/s MR Peak grad:    90.6 mmHg MR Mean grad:    53.0 mmHg MR Vmax:         476.00 cm/s MR Vmean:        334.0 cm/s MR PISA:         3.53 cm MR PISA Eff ROA: 29 mm MR PISA Radius:  0.75 cm HGwyndolyn KaufmanMD Electronically signed by HGwyndolyn KaufmanMD Signature Date/Time: 11/23/2021/4:52:21 PM    Final    CT HEAD CODE STROKE WO CONTRAST  Result Date: 11/12/2021 CLINICAL DATA:  Code stroke. Neuro deficit, acute, stroke suspected. EXAM: CT HEAD WITHOUT CONTRAST TECHNIQUE: Contiguous axial images were obtained from the base of the skull through the vertex without intravenous contrast. RADIATION DOSE REDUCTION: This exam was performed according to the departmental dose-optimization program which includes automated exposure control, adjustment of the mA and/or kV according to patient size and/or use of iterative reconstruction technique. COMPARISON:  04/17/2020 FINDINGS: Brain: The study suffers from significant motion degradation. No focal abnormality is seen affecting the brainstem or cerebellum. Cerebral hemispheres show an old lacunar infarction in the left basal ganglia an old left frontal cortical and subcortical infarction. No sign of acute infarction, mass lesion, hemorrhage, hydrocephalus  or  extra-axial collection. Vascular: There is atherosclerotic calcification of the major vessels at the base of the brain. Skull: Negative, allowing for the motion degradation. Sinuses/Orbits: Clear/normal Other: None ASPECTS ([place] Stroke Program Early CT Score) - Ganglionic level infarction (caudate, lentiform nuclei, internal capsule, insula, M1-M3 cortex): 7 - Supraganglionic infarction (M4-M6 cortex): 3 Total score (0-10 with 10 being normal): 10 IMPRESSION: 1. Motion degraded study. No acute CT finding. Old left basal ganglia lacunar infarction. Old left frontal cortical and subcortical infarction. 2. ASPECTS is 10, allowing for motion. 3. These results were called by telephone at the time of interpretation on 11/12/2021 at 3:22 pm to provider MATTHEW TRIFAN , who verbally acknowledged these results. Electronically Signed   By: Nelson Chimes M.D.   On: 11/12/2021 15:27   VAS [place] UPPER EXTREMITY VENOUS DUPLEX  Result Date: 11/21/2021 UPPER VENOUS STUDY  Patient Name:  EDWYNA DANGERFIELD  Date of Exam:   11/21/2021 Medical Rec #: [id number]        Accession #:    [id number] Date of Birth: Dec 18, 1965        Patient Gender: F Patient Age:   77 years Exam Location:  [facility] Procedure:      VAS [place] UPPER EXTREMITY VENOUS DUPLEX Referring Phys: Noemi Chapel --------------------------------------------------------------------------------  Indications: Edema Comparison Study: No prior study Performing Technologist: Maudry Mayhew MHA, RDMS, RVT, RDCS  Examination Guidelines: A complete evaluation includes B-mode imaging, spectral Doppler, color Doppler, and power Doppler as needed of all accessible portions of each vessel. Bilateral testing is considered an integral part of a complete examination. Limited examinations for reoccurring indications may be performed as noted.  Right Findings: +----------+------------+---------+-----------+----------+--------------------+  RIGHT       Compressible Phasicity Spontaneous Properties       Summary         +----------+------------+---------+-----------+----------+--------------------+  IJV            Full        Yes        Yes                                      +----------+------------+---------+-----------+----------+--------------------+  Subclavian     Full        Yes        Yes                                      +----------+------------+---------+-----------+----------+--------------------+  Axillary       Full        Yes        Yes                                      +----------+------------+---------+-----------+----------+--------------------+  Brachial       None                   No                  Acute surrounding  PICC          +----------+------------+---------+-----------+----------+--------------------+  Radial         Full                                                            +----------+------------+---------+-----------+----------+--------------------+  Ulnar          Full                                                            +----------+------------+---------+-----------+----------+--------------------+  Cephalic       Full                                                            +----------+------------+---------+-----------+----------+--------------------+  Basilic                                                     Not visualized     +----------+------------+---------+-----------+----------+--------------------+  Left Findings: +----------+------------+---------+-----------+----------+-------+  LEFT       Compressible Phasicity Spontaneous Properties Summary  +----------+------------+---------+-----------+----------+-------+  IJV            Full        Yes        Yes                         +----------+------------+---------+-----------+----------+-------+  Subclavian     Full        Yes        Yes                          +----------+------------+---------+-----------+----------+-------+  Axillary       Full        Yes        Yes                         +----------+------------+---------+-----------+----------+-------+  Brachial       Full        Yes        Yes                         +----------+------------+---------+-----------+----------+-------+  Radial         Full                                               +----------+------------+---------+-----------+----------+-------+  Ulnar          Full                                               +----------+------------+---------+-----------+----------+-------+  Cephalic       None                                       Acute   +----------+------------+---------+-----------+----------+-------+  Basilic        Full                                               +----------+------------+---------+-----------+----------+-------+  Summary:  Right: Findings consistent with acute deep vein thrombosis involving the right brachial veins surrounding the PICC line.  Left: No evidence of deep vein thrombosis in the upper extremity. Findings consistent with acute superficial vein thrombosis involving the left cephalic vein.  *See table(s) above for measurements and observations.  Diagnosing physician: Deitra Mayo MD Electronically signed by Deitra Mayo MD on 11/21/2021 at 6:17:06 PM.    Final    [place] EKG SITE RITE  Result Date: 11/25/2021 If Site Rite image not attached, placement could not be confirmed due to current cardiac rhythm.  [place] EKG SITE RITE  Result Date: 11/20/2021 If [facility] image not attached, placement could not be confirmed due to current cardiac rhythm.   Labs:  CBC: Recent Labs    11/22/21 0139 11/23/21 0612 11/24/21 0141 11/25/21 0041  WBC 13.7* 15.4* 13.3* 10.0  HGB 9.1* 10.2* 10.3* 9.1*  HCT 31.5* 33.9* 34.1* 30.3*  PLT 219 271 285 298    COAGS: Recent Labs    11/12/21 1508  INR 1.2  APTT 27    BMP: Recent Labs     11/22/21 0139 11/23/21 0612 11/24/21 0141 11/25/21 0041  NA 142 143 134* 140  K 4.0 3.6 3.7 3.5  CL 104 96* 93* 100  CO2 29 35* 30 32  GLUCOSE 207* 203* 214* 197*  BUN 41* 36* 41* 38*  CALCIUM 9.1 8.8* 8.4* 8.2*  CREATININE 0.79 0.91 0.83 0.80  GFRNONAA >60 >60 >60 >60    LIVER FUNCTION TESTS: Recent Labs    11/12/21 1508 11/13/21 0228 11/16/21 0415  BILITOT 0.3 0.2* 0.4  AST 12* 84* 19  ALT 12 51* 25  ALKPHOS 58 65 202*  PROT 7.0 6.4* 5.4*  ALBUMIN 2.5* 2.3* <1.5*    TUMOR MARKERS: No results for input(s): AFPTM, CEA, CA199, CHROMGRNA in the last 8760 hours.  Assessment and Plan:  Percutaneous gastric tube placement in IR 11/27/21 Risks and benefits image guided gastrostomy tube placement was discussed with the patient's son Boone Master via phone including, but not limited to the need for a barium enema during the procedure, bleeding, infection, peritonitis and/or damage to adjacent structures.  All questions were answered, patient is agreeable to proceed. Consent signed and in chart.   Thank you for this interesting consult.  I greatly enjoyed meeting LEILONI SMITHERS and look forward to participating in their care.  A copy of this report was sent to the requesting provider on this date.  Electronically Signed: Lavonia Drafts, PA-C 11/25/2021, 10:47 AM   I spent a total of 20 Minutes    in face to face in clinical consultation, greater than 50% of which was counseling/coordinating care for percutaneous gastric tube placement

## 2021-11-25 NOTE — Progress Notes (Signed)
K+ 3.5  Replaced per protocol  

## 2021-11-25 NOTE — Progress Notes (Signed)
At bedside for PICC placement. LUA assessed with basilic vein catheter occupancy 10%. Unable to access vein; unable to thread guidewire beyond the tip of the needle X 2. Left cephalic non-compressible due to superficial clot. RUA restricted due to DVT. No other vessels suitable for PICC access. Ute, RN notified and stated she will notify attending MD. ?

## 2021-11-25 NOTE — Procedures (Signed)
Percutaneous Tracheostomy Procedure Note ? ? ?Kristen Murphy  ?[id number]  ?[birth year] ? ?Date:11/25/21  ?Time:11:50 AM  ? ?Provider Performing:Yousef Huge C Tamala Julian ? ?Procedure: Percutaneous Tracheostomy with Bronchoscopic Guidance (16967) ? ?Indication(s) ?Persistent resp failure ? ? ?Consent ?Risks of the procedure as well as the alternatives and risks of each were explained to the patient and/or caregiver.  Consent for the procedure was obtained. ? ?Anesthesia ?Etomidate, Versed, Fentanyl, Vecuronium ? ? ?Time Out ?Verified patient identification, verified procedure, site/side was marked, verified correct patient position, special equipment/implants available, medications/allergies/relevant history reviewed, required imaging and test results available. ? ? ?Sterile Technique ?Maximal sterile technique including sterile barrier drape, hand hygiene, sterile gown, sterile gloves, mask, hair covering. ? ? ? ?Procedure Description ?Appropriate anatomy identified by palpation.  Patient's neck prepped and draped in sterile fashion.  1% lidocaine with epinephrine was used to anesthetize skin overlying neck.  1.5cm incision made and blunt dissection performed until tracheal rings could be easily palpated.   Then a size 6-0 Shiley tracheostomy was placed under bronchoscopic visualization using usual Seldinger technique and serial dilation.   Bronchoscope confirmed placement above the carina.  Tracheostomy was sutured in place with adhesive pad to protect skin under pressure.    Patient connected to ventilator. ? ? ?Complications/Tolerance ?None; patient tolerated the procedure well. ?Chest X-ray is ordered to confirm no post-procedural complication. ? ? ?EBL ?Minimal ? ? ?Specimen(s) ?None  ? ?

## 2021-11-25 NOTE — Progress Notes (Signed)
SLP Cancellation Note ? ?Patient Details ?Name: Kristen Murphy ?MRN: [id number] ?DOB: Nov 03, 1965 ? ? ?Cancelled treatment:       Reason Eval/Treat Not Completed: Patient not medically ready  Orders received for a clinical swallowing and PMSV evaluation.  Patient just had a Shiley #6 cuffed trach placed today.  Will hold off on eval for at least 1-2 days.   ? ? ?Shelly Flatten, MA, CCC-SLP ?Acute Rehab SLP ?[id number] ? ?Kristen Murphy ?11/25/2021, 1:59 PM ?

## 2021-11-25 NOTE — Progress Notes (Addendum)
eLink Physician-Brief Progress Note ?Patient Name: Kristen Murphy ?DOB: [birth year] ?MRN: [id number] ? ? ?Date of Service ? 11/25/2021  ?HPI/Events of Note ? S/p percutaneous  trach, peg status.  ?On High vent setting. No follow up ABG.PEEP 10.  ? ?MV endocarditis, MRSA bacteremia, s/p PEA arrest 2/19. ?DVT.  Going on eliquis for 3 months.  ?eICU Interventions ? ABG ordered.   ? ? ? ?Intervention Category ?Intermediate Interventions: Respiratory distress - evaluation and management ? ?Elmer Sow ?11/25/2021, 8:17 PM ? ?00:04 ?ABG on 400/10/28/100% stable 7.44/48/110/33. ?Wean fio2 as tolerated to keep monitor saturation around 95%.  ? ?

## 2021-11-25 NOTE — Progress Notes (Signed)
? ?NAME:  Kristen Murphy, MRN:  [id number], DOB:  [birth year], LOS: 44 ?ADMISSION DATE:  11/12/2021, CONSULTATION DATE: 11/12/2021 ?REFERRING MD: Dr. Langston Masker, CHIEF COMPLAINT: PEA arrest ? ?History of Present Illness:  ?56 year old woman with a history of tobacco use, cervical cancer ([facility]), post nephrectomy and colostomy, CAD with LAD stent 01/2019, diabetes, depression, hypertension, remote CVA without known deficits.  Also with a history of substance abuse.  Brought in by her son to the ED 2/19 with fever, altered mental status and slurred speech. ? ?Apparently she had a flulike illness for a week, was found confused on 2/19.  Some suspicion by son that she may have been taking more prescription narcotics, also taking many NSAIDs per his report.  She was disoriented in the ED, febrile 103F, hyperglycemic. ?In the ED she apparently experienced acute hypoxemia, had some blood from her mouth, question emesis versus hemoptysis.  Devolved to PEA and required emergent ET intubation and CPR.  ROSC after 2 minutes.  OG tube placed with bright red blood obtained.  Chest x-ray with mild right hilar prominence, more notable on postintubation film.  No infiltrates or effusions ?She has received empiric antibiotics, is receiving 1 unit PRBC, is about to start Protonix infusion. ?Hemodynamically stabilized, mechanically ventilated. ? ?She remains critically ill. ? ?Pertinent  Medical History  ? ?Past Medical History:  ?Diagnosis Date  ? Anemia   ? Anxiety   ? Arthritis   ? Asthma   ? Cervical cancer ([place])   ? Chronic kidney disease   ? Coronary artery disease   ? Depression   ? Diabetes mellitus without complication ([place])   ? Dyspnea   ? Headache   ? Heart murmur   ? Hx of adenomatous polyp of colon   ? Hypertension   ? Rectovaginal fistula   ? Seizures ([place])   ? Stercoral ulcer of rectum 12/2020  ? Stroke [facility])   ? ? ?Significant Hospital Events: ?Including procedures, antibiotic start and stop dates in addition to other pertinent  events   ?2/19 Intubated. Head CT  > no acute CT findings, old left basal ganglia lacunar, old left frontal cortical and subcortical infarct ?MRI brain 2/21 numerous lesions with mild associated hemorrhage and edema. Neurosurgery reviewed imaging and patient not a good candidate for lesion aspiration. ?2/24 MR Brain - Numerous lesions with mild associated hemorrhage. Mild edema. Concerned for brain abscesses ?2/22 LP consistent with bacterial meningitis. Respiratory Cx +MRSA ?2/25 MR Liver with no lesions, hypoperfusion to the subcapsular right hepatic lobe, favor benign vascular phenomena, distended gallbladder, persistent region of hypoperfusion. EEG and LTM 2/25 neg for seizures.  ?Repeat head CT 2/27 with no acute changes  ?2/28 No acute events overnight, LTM read ? Seizure.  Cefepime switched to meropenem. ?3/2 LTM stopped. Meropenem changed to CTX. TEE + mobile mass on MV w/ mild to mod MR EF 60-65% ?3/3 change to IV heparin for possible trach  ?Interim History / Subjective:  ?No changes ?Has poor cortical responses but good brainstem reflexes. ? ?Objective   ?Blood pressure (!) 107/49, pulse 71, temperature 98.2 ?F (36.8 ?C), temperature source Axillary, resp. rate (!) 28, height '5\' 2"'$  (1.575 m), weight 78 kg, SpO2 97 %. ?   ?Vent Mode: PRVC ?FiO2 (%):  [50 %-60 %] 60 % ?Set Rate:  [28 bmp] 28 bmp ?Vt Set:  [400 mL] 400 mL ?PEEP:  [10 cmH20] 10 cmH20 ?Plateau Pressure:  [17 cmH20-22 cmH20] 22 cmH20  ? ?Intake/Output Summary (Last 24 hours)  at 11/25/2021 7972 ?Last data filed at 11/25/2021 0800 ?Gross per 24 hour  ?Intake 2973.76 ml  ?Output 2025 ml  ?Net 948.76 ml  ? ? ?Filed Weights  ? 11/23/21 0500 11/24/21 0500 11/25/21 0500  ?Weight: 79.6 kg 77.2 kg 78 kg  ? ? ?Physical Exam: ?Ill appearing woman on vent ?Lungs with rhonci, thick secretions, triggering vent ?Heart RRR, +SEM, ext warm ?Some flicker movements of ext but not to pain ?Abdomen soft, colostomy in place ?Opens eyes to voice, upward gaze  preference ? ?Patient Lines/Drains/Airways Status   ? ? Active Line/Drains/Airways   ? ? Name Placement date Placement time Site Days  ? Peripheral IV 11/12/21 18 G Anterior;Left;Proximal;Upper Arm 11/12/21  1756  Arm  13  ? Peripheral IV 11/22/21 20 G 1.88" Left;Posterior Forearm 11/22/21  1524  Forearm  3  ? Colostomy LLQ --  --  LLQ  --  ? External Urinary Catheter 11/21/21  0900  --  4  ? Airway 7.5 mm 11/12/21  1748  -- 13  ? Small Bore Feeding Tube 10 Fr. Left nare Marking at nare/corner of mouth 67 cm 11/15/21  1030  Left nare  10  ? ?  ?  ? ?  ? ?Chemistry benign ?CBC stable ?No new chest imaging ? 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 ?Acute hypoxemic respiratory failure due to necrosing MRSA pneumonia of  ?Multifactorial encephalopathy in setting of MRSA CNS infection, sedation, post arrest,and sepsis.  LTVEEG d/c'd 3/1.  No active seizures, active focus in left frontal region ?Acute on chronic anemia, UGIB- s/p EGD on 2/19 showing erosive gastropathy ?Hx tobacco abuse ?PICC-associated DVT- on [facility], PICC removed ?Hx cervical Ca post nephrectomy and diverting colostomy ?S/P in ER PEA arrest 11/12/21 ?Hx CAD s/p LAD stent 2020 ?DM w/ hyperglycemia ?Depression ?CKD ?Seizure hx ?HTN ?Remote CVA ?Distant hx substance abuse ? ?- Prolonged abx per ID ?- PICC consult ?- Vent support, VAP prevention bundle, usual LTVV ?- Heparin gtt on hold for trach today then resume after ?- PEG consult ?- After trach/PEG would switch to eliquis x 3 mo for provoked DVT ?- Post trach bundle and work toward [employer] ?- Updated son by phone that this will be a weeks to months process to see if any neuro recovery; if no recovery within a month or two family seem to be interested in  discussing comfort care ? ?Best Practice (right click and "Reselect all SmartList Selections" daily)  ? ?Diet/type: tubefeeds ?DVT prophylaxis: heparin gtt ?GI prophylaxis: PPI ?Lines: N/A > PICC ?Foley:  N/A ?Code Status:  full code ?Last date of multidisciplinary goals of care discussion: pending ? ? ?Patient critically ill due to resp failure ?Interventions to address this today vent titration ?Risk of deterioration without these interventions is high ? ?I personally spent 33 minutes providing critical care not including any separately billable procedures ? ?Erskine Emery MD ?[facility] Pulmonary Critical Care ? ?Prefer epic messenger for cross cover needs ?If after hours, please call E-link ? ?

## 2021-11-26 ENCOUNTER — Inpatient Hospital Stay (HOSPITAL_COMMUNITY): Payer: Medicaid Other

## 2021-11-26 ENCOUNTER — Other Ambulatory Visit: Payer: Self-pay

## 2021-11-26 DIAGNOSIS — G060 Intracranial abscess and granuloma: Secondary | ICD-10-CM | POA: Diagnosis not present

## 2021-11-26 LAB — BASIC METABOLIC PANEL
Anion gap: 9 (ref 5–15)
BUN: 34 mg/dL — ABNORMAL HIGH (ref 6–20)
CO2: 28 mmol/L (ref 22–32)
Calcium: 8.2 mg/dL — ABNORMAL LOW (ref 8.9–10.3)
Chloride: 100 mmol/L (ref 98–111)
Creatinine, Ser: 0.72 mg/dL (ref 0.44–1.00)
GFR, Estimated: >60 mL/min (ref >60)
Glucose, Bld: 243 mg/dL — ABNORMAL HIGH (ref 70–99)
Potassium: 4.2 mmol/L (ref 3.5–5.1)
Sodium: 137 mmol/L (ref 135–145)

## 2021-11-26 LAB — HEPARIN LEVEL (UNFRACTIONATED)
Heparin Unfractionated: 0.21 IU/mL — ABNORMAL LOW (ref 0.30–0.70)
Heparin Unfractionated: 0.17 IU/mL — ABNORMAL LOW (ref 0.30–0.70)

## 2021-11-26 LAB — GLUCOSE, CAPILLARY
Glucose-Capillary: 108 mg/dL — ABNORMAL HIGH (ref 70–99)
Glucose-Capillary: 114 mg/dL — ABNORMAL HIGH (ref 70–99)
Glucose-Capillary: 116 mg/dL — ABNORMAL HIGH (ref 70–99)
Glucose-Capillary: 125 mg/dL — ABNORMAL HIGH (ref 70–99)
Glucose-Capillary: 151 mg/dL — ABNORMAL HIGH (ref 70–99)
Glucose-Capillary: 167 mg/dL — ABNORMAL HIGH (ref 70–99)

## 2021-11-26 MED ORDER — PROPOFOL 1000 MG/100ML IV EMUL
5.0000 ug/kg/min | INTRAVENOUS | Status: DC
  Administered 2021-11-26: 5 ug/kg/min via INTRAVENOUS
  Filled 2021-11-26 (×2): qty 100

## 2021-11-26 MED ORDER — CHLORHEXIDINE GLUCONATE 0.12 % MT SOLN
OROMUCOSAL | Status: AC
  Filled 2021-11-26: qty 15

## 2021-11-26 MED ORDER — FUROSEMIDE 10 MG/ML IJ SOLN
40.0000 mg | Freq: Three times a day (TID) | INTRAMUSCULAR | Status: AC
  Administered 2021-11-26 – 2021-11-27 (×3): 40 mg via INTRAVENOUS
  Filled 2021-11-26 (×3): qty 4

## 2021-11-26 NOTE — Procedures (Signed)
Routine EEG Report ? ?Story Kristen Murphy is a 56 y.o. female with a history of multiple brain abscesses who is undergoing an EEG to evaluate for seizures. ? ?Report: This EEG was acquired with electrodes placed according to the International 10-20 electrode system (including Fp1, Fp2, F3, F4, C3, C4, P3, P4, O1, O2, T3, T4, T5, T6, A1, A2, Fz, Cz, Pz). The following electrodes were missing or displaced: none. ? ?The background primarily consisted of diffuse slowing at 3 Hz with occasional brief runs of higher frequencies in the theta range. No clear waking rhythm was identified. Some sleep architecture was noted in the form of K complexes. There was no focal slowing. There were no interictal epileptiform discharges. There were no electrographic seizures identified. Photic stimulation and hyperventilation were not performed.  ? ?Impression and clinical correlation: This EEG was obtained while sedated on propofol and is abnormal due to severe diffuse slowing indicative of global cerebral dysfunction, medication effect, or both. No epileptiform abnormalities were seen during this recording. ? ?Su Monks, MD ?[facility] ?[phone] ? ?If 7pm- 7am, please page neurology on call as listed in [place]. ? ?

## 2021-11-26 NOTE — Progress Notes (Signed)
Speech Language Pathology Treatment:    ?Patient Details ?Name: Kristen Murphy ?MRN: [id number] ?DOB: [birth year] ?Today's Date: 11/26/2021 ?Time:  -  ?  ? ?Pt received trach yesterday and is currently on ventilator. SLP will follow for PMV inline or when on trach collar as is appropriate.  ?    ?   ? ? ? ?   ? ? ? ? ?  ?  ? ? ?Houston Siren ? ?11/26/2021, 9:58 AM ?

## 2021-11-26 NOTE — Progress Notes (Signed)
Clenching teeth shut with fine tremors around mouth noted. Eyes w/right, upward gaze. Only lasting about 20 seconds, 2 episodes observed.  BP 161/71, HR 120-130 ,ST.  ?Dr Tamala Julian notified off facial tremors, Versed given. Now BP 118/56, HR 85 ?

## 2021-11-26 NOTE — Evaluation (Signed)
Occupational Therapy Evaluation Patient Details Name: Kristen Murphy MRN: [id number] DOB: 05-20-1966 Today's Date: 11/26/2021   History of Present Illness This 56 y.o. female admitted with AMS, and slurred speech.  In ED she experienced acute hypoxemia with PEA arrest and emergent ET intubation and CPR.  ROSC after 2 mins. 2/19  MRI on  2/21 with numerous lesions (>50) with mild associated hemmorrhage and edema concerning for brain abcesses (not a good candidate for lesion aspiration).   2/22 LP consistent with bacterial meningitis.  Respiratory cx +MRSA; repeat CT of brain 2/26 showed Relatively well-defined focus of hypoattenuation in the superior,medial right frontal lobe and adjacent right parietal lobe,consistent with recent infarction,  2/28 LTM with ? seizures, 3/2 TEE + for mobile mass on MV with mild to moderate MR EF 60-65% - endocarditis.  Found to have Rt UE DVT and SVT of LT UE.  Trach  3/4.   PMH includes:  Anemia, anxiety, asthma, cervical CA, CKD, CAD, depression, DM, HTN, seizures, h/o stroke without residual deficits   Clinical Impression   Pt admitted with above. She demonstrates the below listed deficits and will benefit from continued OT to maximize safety and independence with BADLs.  Pt was seen in conjunction with PT.  She is minimally responsive.  She appeared to move eyes to Rt consistently with auditory stimuli (loud clapping), and demonstrated babinski reflex on Rt foot.  She did not attempt to follow commands, and no withdrawal noted to painful stimuli.   She did possibly demonstrate ? Extensor pattern of trunk when moved into unsupported sitting while in chair position, and possible mild hypertonicity Lt UE after rolling.  Otherwise bil. UEs, LEs, head/neck and trunk flaccid.  PTA, pt lived with her son and was fully independent. OT will follow pt for a trial of 2x/week.         Recommendations for follow up therapy are one component of a multi-disciplinary discharge  planning process, led by the attending physician.  Recommendations may be updated based on patient status, additional functional criteria and insurance authorization.   Follow Up Recommendations  OT at Long-term acute care hospital    Assistance Recommended at Discharge Frequent or constant Supervision/Assistance  Patient can return home with the following Two people to help with walking and/or transfers;Two people to help with bathing/dressing/bathroom;Direct supervision/assist for medications management;Direct supervision/assist for financial management;Assist for transportation;Help with stairs or ramp for entrance    Functional Status Assessment  Patient has had a recent decline in their functional status and/or demonstrates limited ability to make significant improvements in function in a reasonable and predictable amount of time  Equipment Recommendations  None recommended by OT    Recommendations for Other Services       Precautions / Restrictions Precautions Precautions: Fall;Other (comment) Precaution Comments: Trach      Mobility Bed Mobility Overal bed mobility: Needs Assistance Bed Mobility: Rolling Rolling: Total assist         General bed mobility comments: Pt unable to assist with rolling Lt and Rt for linen change    Transfers                   General transfer comment: unable      Balance Overall balance assessment: Needs assistance   Sitting balance-Leahy Scale: Zero Sitting balance - Comments: Pt moved into partial chair position and was moved into unsupported sitting with total A +2.  No balance reactions noted  ADL either performed or assessed with clinical judgement   ADL Overall ADL's : Needs assistance/impaired                                       General ADL Comments: Pt requires total A for all aspects     Vision   Additional Comments: Pt with rythmic roving eye  movements.  She does not visually fixate or track.  No blink to threat noted     Perception Perception Comments: Pt unable due to minimally conscious   Praxis Praxis Praxis-Other Comments: pt unable    Pertinent Vitals/Pain Pain Assessment Pain Assessment: CPOT Facial Expression: Relaxed, neutral Body Movements: Absence of movements Muscle Tension: Relaxed Compliance with ventilator (intubated pts.): Tolerating ventilator or movement Vocalization (extubated pts.): N/A CPOT Total: 0     Hand Dominance Right   Extremity/Trunk Assessment Upper Extremity Assessment Upper Extremity Assessment: RUE deficits/detail;LUE deficits/detail RUE Deficits / Details: No active movement noted.  PROM WFL - Flaccid. RUE Coordination: decreased gross motor;decreased fine motor LUE Deficits / Details: No active movement noted.  PROM WFL - Flaccid, but ? very mild hypertonicity after rolling pt LUE Coordination: decreased gross motor;decreased fine motor   Lower Extremity Assessment Lower Extremity Assessment: Defer to PT evaluation   Cervical / Trunk Assessment Cervical / Trunk Assessment: Other exceptions Cervical / Trunk Exceptions: Pt with no active movement head/neck and trunk.  Poor head control.  ? trunk extensor pattern when pt moved into unsupported sitting bed while in chair position   Communication Communication Communication: Tracheostomy   Cognition Arousal/Alertness:  (eyes open)   Overall Cognitive Status: Impaired/Different from baseline                                 General Comments: Pt minimally responsive.  Eyes rolled Rt and upwards consistently to auditory stim (clapping) on the Rt side.  She demonstrated babinski on the Rt.  No other responses to stimuli noted - no blink to threat, does not withdrawal to stimuli.  No apical or appendicular command following noted     General Comments  HR 97-106 with activity.  Sp02 >94% with pt on 50% Fi02, PEEP 8     Exercises Exercises: Other exercises   Shoulder Instructions      Home Living Family/patient expects to be discharged to:: Private residence Living Arrangements: Children                               Additional Comments: Pt lived with son      Prior Functioning/Environment Prior Level of Function : Independent/Modified Independent             Mobility Comments: per daughter pt was fully independent without AD.  No history of falls ADLs Comments: Per daughter, pt was fully independent, including driving.  Does not work        OT Problem List: Decreased strength;Decreased activity tolerance;Impaired balance (sitting and/or standing);Impaired vision/perception;Decreased coordination;Decreased cognition;Decreased safety awareness;Decreased knowledge of use of DME or AE;Decreased knowledge of precautions;Cardiopulmonary status limiting activity;Impaired sensation;Impaired tone;Impaired UE functional use      OT Treatment/Interventions: Self-care/ADL training;Therapeutic exercise;Neuromuscular education;Energy conservation;DME and/or AE instruction;Manual therapy;Splinting;Therapeutic activities;Cognitive remediation/compensation;Visual/perceptual remediation/compensation;Patient/family education;Balance training    OT Goals(Current goals can be found in the care plan section)  Acute Rehab OT Goals Patient Stated Goal: Pt unable.  Daughter is hopeful that pt will be able to respond and participate OT Goal Formulation: With family Time For Goal Achievement: 12/10/21 Potential to Achieve Goals: Fair ADL Goals Additional ADL Goal #1: Pt will turn head to auditory stimulation 3/5 trials Additional ADL Goal #2: Pt will visually fixate on object 3/5 trials Additional ADL Goal #3: Pt will hold head up with mod A x 45 seconds while in supported sitting Additional ADL Goal #4: Family will be indepenent with PROM bil. UEs  OT Frequency: Min 2X/week    Co-evaluation  PT/OT/SLP Co-Evaluation/Treatment: Yes Reason for Co-Treatment: Complexity of the patient's impairments (multi-system involvement);Necessary to address cognition/behavior during functional activity   OT goals addressed during session: Strengthening/ROM      AM-PAC OT "6 Clicks" Daily Activity     Outcome Measure Help from another person eating meals?: Total Help from another person taking care of personal grooming?: Total Help from another person toileting, which includes using toliet, bedpan, or urinal?: Total Help from another person bathing (including washing, rinsing, drying)?: Total Help from another person to put on and taking off regular upper body clothing?: Total Help from another person to put on and taking off regular lower body clothing?: Total 6 Click Score: 6   End of Session Equipment Utilized During Treatment: Oxygen Nurse Communication: Mobility status  Activity Tolerance: Other (comment) (impaired cognition) Patient left: in bed;with call bell/phone within reach;with nursing/sitter in room  OT Visit Diagnosis: Cognitive communication deficit (R41.841);Muscle weakness (generalized) (M62.81) Symptoms and signs involving cognitive functions: Cerebral infarction                Time: 4270-6237 OT Time Calculation (min): 34 min Charges:  OT General Charges $OT Visit: 1 Visit OT Evaluation $OT Eval Moderate Complexity: 1 Mod  Nilsa Nutting., OTR/L Acute Rehabilitation Services Pager [phone] Office [place], [place] 11/26/2021, 1:58 PM

## 2021-11-26 NOTE — Progress Notes (Signed)
NEUROLOGY CONSULTATION PROGRESS NOTE  ? ?Date of service: November 26, 2021 ?Patient Name: Kristen Murphy ?MRN:  [id number] ?DOB:  07-17-1966 ? ?Brief HPI  ? ?56 year old woman with a history of cervical cancer, status post nephrectomy 2 months ago, colostomy, chronic occlusion of the left common carotid artery, CAD, diabetes, depression, hypertension, remote history of stroke without residual deficits who was brought in by EMS for altered mental status. She had had a flulike illness for a week PTA and was found confused on 2/19. The patient had acute PEA arrest while in the ED on Sunday (2/19) with CPR time of 2 minutes. Diagnosed with acute respiratory failure with hypoxemia and was intubated at that time. MRI brain performed on Tuesday (2/21)revealed numerous (> 50) widely distributed lesions in the cerebral hemispheres as well as the cerebellum. CSF from LP showed: cell count markedly abnormal with WBC 585 of which 84% were segmented neutrophils. Glucose 21 (low), Total protein 191 (high). Consistent with bacterial meningitis. cEEG initially with epileptogenicity arising from left frontotemporal region(2/23). Later was difficult to interpret and at times along ictal-interictal spectrum. On Keppra and Vimpat. ? ?As for her prognosis, felt to be indeterminate and we signed off. ? ?  ?Interval Hx  ? ?In the interval, she is now down to fentanyl at 50 and was started on propofol 16mg/kg/min due to biting. She now has a trach in place. TEE with possible mitral valve vegetations. ? ?On 3/5 around late morning, she was noted to have "clenching teeth shut with fine tremors around mouth noted. Eyes w/right, upward gaze. Only lasting about 20 seconds, 2 episodes observed." Per notes from RN. We were asked for assistance with further evaluation. ? ?Vitals  ? ?Vitals:  ? 11/26/21 1400 11/26/21 1500 11/26/21 1516 11/26/21 1600  ?BP: 137/71 125/60 125/60 (!) 170/85  ?Pulse: 73 82 89 (!) 106  ?Resp: 18 19 (!) 28 (!) 24  ?Temp:       ?TempSrc:      ?SpO2: 93% 94% 94% 92%  ?Weight:      ?Height:      ?  ? ?Body mass index is 31.37 kg/m?. ? ?Physical Exam  ? ?General: Laying comfortably in bed; trach in place.  ?HENT: Normal oropharynx and mucosa. Normal external appearance of ears and nose. Bits down hard. ?Neck: Supple, no pain or tenderness ?CV: No JVD. No peripheral edema.  ?Pulmonary: Symmetric Chest rise. Normal respiratory effort.  ?Abdomen: Soft to touch, non-tender.  ?Ext: No cyanosis, edema, or deformity  ?Skin: No rash. Normal palpation of skin.   ?Musculoskeletal: Normal digits and nails by inspection. No clubbing.  ? ?Neurologic Examination on Fentanyl at 50 and propofol at 15  ?Mental status/Cognition: opens eyes partially to loud clap. Eyes roll up. Grimaces and turns her head to nares stimulation. ?Speech/language: no speech, no attempts to communicate. ?Cranial nerves:  ? CN II Pupils equal and reactive to light, unable to assess for visual field deficit.  ? CN III,IV,VI EOM intact to doll's eye, no gaze preference or deviation, no nystagmus  ? CN V Corneal intact  ? CN VII Blinks tight shut to corneal reflex.  ? CN VIII Does not turn head towards speech  ? CN IX & X Unable to assess cough or gag due to biting down hard.  She did have some spontaneous cough to nares stimulation.  ? CN XI Head turned to her Right.  ? CN XII Unable to visualiize tongue due to biting down hard. Does not  protrude tongue on command.  ? ?Motor/sensory:  ?Muscle bulk: normal, tone increased mildly throughout ?LUE: mild extensor posturing to pinch ?RUE: vigorous extensor posturing to pinch ?BL Lower extremities: triple flexion to noxious stimuli. ? ?Reflexes: ? Right Left Comments  ?Pectoralis     ? Biceps (C5/6) 2 2   ?Brachioradialis (C5/6) 2 2   ? Triceps (C6/7) 2 2   ? Patellar (L3/4) 3 3   ? Achilles (S1) 2 2   ? Hoffman     ? Plantar     ?Jaw jerk   ?Coordination/Complex Motor:  ?Unable to assess. ? ?Labs  ? ?Basic Metabolic Panel:  ?Lab Results   ?Component Value Date  ? NA 137 11/26/2021  ? K 4.2 11/26/2021  ? CO2 28 11/26/2021  ? GLUCOSE 243 (H) 11/26/2021  ? BUN 34 (H) 11/26/2021  ? CREATININE 0.72 11/26/2021  ? CALCIUM 8.2 (L) 11/26/2021  ? GFRNONAA >60 11/26/2021  ? GFRAA >60 02/20/2019  ? ?HbA1c:  ?Lab Results  ?Component Value Date  ? HGBA1C 7.2 (H) 11/12/2021  ? ?LDL:  ?Lab Results  ?Component Value Date  ? LDLCALC UNABLE TO CALCULATE IF TRIGLYCERIDE OVER 400 mg/dL 02/19/2019  ? ?Urine Drug Screen:  ?   ?Component Value Date/Time  ? LABOPIA NONE DETECTED 11/12/2021 1754  ? COCAINSCRNUR NONE DETECTED 11/12/2021 1754  ? LABBENZ POSITIVE (A) 11/12/2021 1754  ? AMPHETMU NONE DETECTED 11/12/2021 1754  ? THCU NONE DETECTED 11/12/2021 1754  ? LABBARB NONE DETECTED 11/12/2021 1754  ?  ?Alcohol Level  ?   ?Component Value Date/Time  ? ETH <10 11/12/2021 1521  ? ?No results found for: PHENYTOIN, ZONISAMIDE, LAMOTRIGINE, LEVETIRACETA ?No results found for: PHENYTOIN, PHENOBARB, VALPROATE, CBMZ ? ?Imaging and Diagnostic studies  ? ?CTH w/o contrast:(11/20/21) ?1. No change from recent head CT to explain the new deficit. ?2. Known numerous brain lesions and subacute right cerebral ?infarcts. ? ?MRI Brain w + w/o C(11/14/21): ?Numerous lesions in the brain. There are lesions in the cerebellar ?hemispheres and in both cerebral hemispheres widely distributed. ?Greater than 50 lesions are identified. The lesions have a rounded ?appearance with restricted diffusion and mild enhancement. Many ?lesions show evidence of mild associated hemorrhage. Mild edema. ?Restricted diffusion in the left lateral ventricle likely due to ?ventriculitis. ?  ?The pattern is most likely due to multiple brain abscesses ?particularly given the white count of 27. Differential diagnosis ?includes metastatic disease and demyelinating disease. Correlate ?with lumbar puncture results. ? ? ?Impression  ? ?[name] is a 56 year old female with a history of cervical cancer, status post  nephrectomy 2 months ago, colostomy, chronic occlusion of the left common carotid artery, CAD, diabetes, depression, hypertension, remote history of stroke without residual deficits who was brought in by EMS for altered mental status. She had had a flulike illness for a week PTA and was found confused on 2/19. The patient had acute PEA arrest while in the ED on Sunday (2/19) with CPR time of 2 minutes. Diagnosed with acute respiratory failure with hypoxemia and was intubated at that time. MRI brain performed on Tuesday (2/21)revealed numerous cerebral abscesses, LP consistent with Meningitis. cEEG initially with epileptogenicity arising from left frontotemporal region(2/23). Later was difficult to interpret and at times along ictal-interictal spectrum. On Keppra 1500 BID and Vimpat '100mg'$  BID. ? ?Today had 2 episodes concerning for possible seizure. Noted to have clenching teeth shut with fine tremors around mouth noted. Eyes w/right, upward gaze. Only lasting about 20 seconds. ? ?Recommendations  ?-  cEEG overnight. ?- We will continue to follow along. ?- Wean sedation as tolerated ?- Abx per ID. ?______________________________________________________________________ ? ?This patient is critically ill and at significant risk of neurological worsening, death and care requires constant monitoring of vital signs, hemodynamics,respiratory and cardiac monitoring, neurological assessment, discussion with family, other specialists and medical decision making of high complexity. I spent 40 minutes of neurocritical care time  in the care of  this patient. This was time spent independent of any time provided by nurse practitioner or PA. ? ?Donnetta Simpers ?[facility] ?Pager Number [phone] ?11/26/2021  5:23 PM ? ? ?Thank you for the opportunity to take part in the care of this patient. If you have any further questions, please contact the neurology consultation attending. ? ?Signed, ? ?Donnetta Simpers ?[facility] ?Pager Number [phone] ? ?

## 2021-11-26 NOTE — Evaluation (Signed)
Physical Therapy Evaluation Patient Details Name: Kristen Murphy MRN: [id number] DOB: 07-14-1966 Today's Date: 11/26/2021  History of Present Illness  This 56 y.o. female admitted 11/12/21 with AMS, and slurred speech.  In ED she experienced acute hypoxemia with PEA arrest and emergent ET intubation and CPR.  ROSC after 2 mins. 2/19  MRI on  2/21 with numerous lesions (>50) with mild associated hemmorrhage and edema concerning for brain abcesses (not a good candidate for lesion aspiration).   2/22 LP consistent with bacterial meningitis.  Respiratory cx +MRSA; repeat CT of brain 2/26 showed Relatively well-defined focus of hypoattenuation in the superior,medial right frontal lobe and adjacent right parietal lobe,consistent with recent infarction,  2/28 LTM with ? seizures, 3/2 TEE + for mobile mass on MV with mild to moderate MR EF 60-65% - endocarditis.  Found to have Rt UE DVT and SVT of LT UE.  Trach  3/4.   PMH includes:  Anemia, anxiety, asthma, cervical CA, CKD, CAD, depression, DM, HTN, seizures, h/o stroke without residual deficits   Clinical Impression  Pt presents with condition above and deficits mentioned below, see PT Problem List. PTA, she was living with her adult son and was fully independent. Currently, pt is minimally responsive. She appeared to move eyes to Rt consistently with auditory stimuli (loud clapping) on her R only. She also demonstrated babinski reflex on Rt foot.  She did not attempt to follow commands, and no withdrawal noted to painful stimuli. She did possibly demonstrate ? Extensor pattern of trunk when moved into unsupported sitting while in chair position, and possible mild hypertonicity Lt UE after rolling. Otherwise bil. UEs, LEs, head/neck and trunk flaccid. Acute PT will follow pt for a trial of 2x/week. At this time, recommending PT at [facility].      Recommendations for follow up therapy are one component of a multi-disciplinary discharge planning process, led by the  attending physician.  Recommendations may be updated based on patient status, additional functional criteria and insurance authorization.  Follow Up Recommendations PT at Long-term acute care hospital    Assistance Recommended at Discharge Frequent or constant Supervision/Assistance  Patient can return home with the following  Two people to help with walking and/or transfers;Two people to help with bathing/dressing/bathroom;Assistance with cooking/housework;Assistance with feeding;Direct supervision/assist for medications management;Direct supervision/assist for financial management;Assist for transportation;Help with stairs or ramp for entrance    Equipment Recommendations Other (comment) (defer to next venue of care)  Recommendations for Other Services       Functional Status Assessment Patient has had a recent decline in their functional status and/or demonstrates limited ability to make significant improvements in function in a reasonable and predictable amount of time     Precautions / Restrictions Precautions Precautions: Fall;Other (comment) Precaution Comments: Trach on vent; cortrak Restrictions Weight Bearing Restrictions: No      Mobility  Bed Mobility Overal bed mobility: Needs Assistance Bed Mobility: Rolling Rolling: Total assist, +2 for physical assistance, +2 for safety/equipment         General bed mobility comments: Pt unable to assist with rolling Lt and Rt for linen change    Transfers                   General transfer comment: unable    Ambulation/Gait               General Gait Details: unable  Science writer  Modified Rankin (Stroke Patients Only)       Balance Overall balance assessment: Needs assistance   Sitting balance-Leahy Scale: Zero Sitting balance - Comments: Pt moved into partial chair position and was moved into unsupported sitting with total A +2.  No balance reactions noted but  pt appeared to extend trunk                                     Pertinent Vitals/Pain Pain Assessment Pain Assessment: Faces Faces Pain Scale: No hurt Pain Intervention(s): Monitored during session    Home Living Family/patient expects to be discharged to:: Private residence Living Arrangements: Children                 Additional Comments: Pt lived with son    Prior Function Prior Level of Function : Independent/Modified Independent             Mobility Comments: per daughter pt was fully independent without AD.  No history of falls ADLs Comments: Per daughter, pt was fully independent, including driving.  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  PROM WFL - Flaccid. RUE Coordination: decreased gross motor;decreased fine motor LUE Deficits / Details: No active movement noted.  PROM WFL - Flaccid, but ? very mild hypertonicity after rolling pt LUE Coordination: decreased gross motor;decreased fine motor    Lower Extremity Assessment Lower Extremity Assessment: RLE deficits/detail;LLE deficits/detail RLE Deficits / Details: No active movement noted; PROM WFL; flaccid; +Babinski noted, - for clonus RLE Coordination: decreased gross motor;decreased fine motor LLE Deficits / Details: No active movement noted; PROM WFL; flaccid; - for clonus and babinski; no reaction to tactile stimuli at foot LLE Sensation: decreased light touch LLE Coordination: decreased fine motor;decreased gross motor    Cervical / Trunk Assessment Cervical / Trunk Assessment: Other exceptions Cervical / Trunk Exceptions: Pt with no active movement head/neck and trunk.  Poor head control.  ? trunk extensor pattern when pt moved into unsupported sitting bed while in chair position  Communication    Communication: Tracheostomy  Cognition Arousal/Alertness:  (eyes open) Behavior During Therapy: Flat affect Overall Cognitive Status: Impaired/Different from baseline                                 General Comments: Pt minimally responsive.  Eyes rolled Rt and upwards consistently to auditory stim (clapping) on the Rt side.  She demonstrated babinski on the Rt leg.  No other responses to stimuli noted - no blink to threat, does not withdrawal to stimuli.  No apical or appendicular command following noted.        General Comments General comments (skin integrity, edema, etc.): HR 97-106 with activity.  Sp02 >94% with pt on 50% Fi02, PEEP 8    Exercises Other Exercises Other Exercises: PROM to bil UEs and lower extremities along with cervical rotation   Assessment/Plan    PT Assessment Patient needs continued PT services  PT Problem List Decreased strength;Decreased activity tolerance;Decreased balance;Decreased mobility;Decreased coordination;Decreased cognition;Cardiopulmonary status limiting activity;Impaired sensation       PT Treatment Interventions DME instruction;Gait training;Therapeutic activities;Functional mobility training;Therapeutic exercise;Neuromuscular re-education;Cognitive remediation;Patient/family education;Wheelchair mobility training;Balance training  PT Goals (Current goals can be found in the Care Plan section)  Acute Rehab PT Goals Patient Stated Goal: did not state PT Goal Formulation: With family Time For Goal Achievement: 12/10/21 Potential to Achieve Goals: Fair    Frequency Min 2X/week     Co-evaluation PT/OT/SLP Co-Evaluation/Treatment: Yes Reason for Co-Treatment: Complexity of the patient's impairments (multi-system involvement);Necessary to address cognition/behavior during functional activity PT goals addressed during session: Mobility/safety with mobility;Strengthening/ROM OT goals addressed during session:  Strengthening/ROM       AM-PAC PT "6 Clicks" Mobility  Outcome Measure Help needed turning from your back to your side while in a flat bed without using bedrails?: Total Help needed moving from lying on your back to sitting on the side of a flat bed without using bedrails?: Total Help needed moving to and from a bed to a chair (including a wheelchair)?: Total Help needed standing up from a chair using your arms (e.g., wheelchair or bedside chair)?: Total Help needed to walk in hospital room?: Total Help needed climbing 3-5 steps with a railing? : Total 6 Click Score: 6    End of Session Equipment Utilized During Treatment: Oxygen Activity Tolerance: Other (comment) (limited by cognition) Patient left: in bed;with call bell/phone within reach Nurse Communication: Mobility status PT Visit Diagnosis: Muscle weakness (generalized) (M62.81);Difficulty in walking, not elsewhere classified (R26.2);Other symptoms and signs involving the nervous system (Z61.096)    Time: 0454-0981 PT Time Calculation (min) (ACUTE ONLY): 33 min   Charges:   PT Evaluation $PT Eval Moderate Complexity: 1 Mod          Moishe Spice, PT, DPT Acute Rehabilitation Services  Pager: [phone] Office: [phone]   Orvan Falconer 11/26/2021, 3:30 PM

## 2021-11-26 NOTE — Progress Notes (Signed)
ANTICOAGULATION CONSULT NOTE  ? ?Pharmacy Consult for heparin ?Indication:  Upper Extremity Thrombus ? ?Allergies  ?Allergen Reactions  ? Glimepiride Anaphylaxis  ?  Has sulfa in it per pharmacy  ? Sulfa Antibiotics Anaphylaxis and Hives  ? Effexor [Venlafaxine] Hives  ? ? ?Patient Measurements: ?Height: '5\' 2"'$  (157.5 cm) ?Weight: 78 kg (171 lb 15.3 oz) ?IBW/kg (Calculated) : 50.1 ? ? ?Vital Signs: ?Temp: 100 ?F (37.8 ?C) (03/04 2343) ?Temp Source: Axillary (03/04 2343) ?BP: 147/52 (03/05 0000) ?Pulse Rate: 103 (03/05 0000) ? ?Labs: ?Recent Labs  ?  11/23/21 ?0612 11/24/21 ?0141 11/25/21 ?0041 11/25/21 ?2315 11/26/21 ?0105  ?HGB 10.2* 10.3* 9.1* 10.5*  --   ?HCT 33.9* 34.1* 30.3* 31.0*  --   ?PLT 271 285 298  --   --   ?HEPARINUNFRC  --   --  0.76*  --  0.21*  ?CREATININE 0.91 0.83 0.80  --  0.72  ? ? ? ?Estimated Creatinine Clearance: 76 mL/min (by C-G formula based on SCr of 0.72 mg/dL). ? ? ?Medical History: ?Past Medical History:  ?Diagnosis Date  ? Anemia   ? Anxiety   ? Arthritis   ? Asthma   ? Cervical cancer ([place])   ? Chronic kidney disease   ? Coronary artery disease   ? Depression   ? Diabetes mellitus without complication ([place])   ? Dyspnea   ? Headache   ? Heart murmur   ? Hx of adenomatous polyp of colon   ? Hypertension   ? Rectovaginal fistula   ? Seizures ([place])   ? Stercoral ulcer of rectum 12/2020  ? Stroke [facility])   ? ? ? ? ?Assessment: ?20yof with MRSA bacteremia, endocarditis, and brain abscess.  S/p VDRF and new UE thrombus > PICC removed and treated with enoxaparin '1mg'$ /kg q12h.  Planning trach over the weekend will change to heparin drip for easier on/off anticoagulation insetting of procedures.   ? ?Heparin level below goal at 0.21 on recheck this morning. Hgb up to 10.5, no bleeding issues noted.  ? ?Goal of Therapy:  ?Heparin level 0.3-0.7 units/ml ?Monitor platelets by anticoagulation protocol: Yes ?  ?Plan:  ?Increase heparin to 1100 units/hr  ?Daily heparin level and CBC ?Monitor s/s bleeding   ? ?Erin Hearing PharmD., BCPS ?Clinical Pharmacist ?11/26/2021 2:28 AM ? ?

## 2021-11-26 NOTE — Progress Notes (Signed)
? ?NAME:  Kristen Murphy, MRN:  [id number], DOB:  1966/02/24, LOS: 14 ?ADMISSION DATE:  11/12/2021, CONSULTATION DATE: 11/12/2021 ?REFERRING MD: Dr. Langston Masker, CHIEF COMPLAINT: PEA arrest ? ?History of Present Illness:  ?56 year old woman with a history of tobacco use, cervical cancer ([facility]), post nephrectomy and colostomy, CAD with LAD stent 01/2019, diabetes, depression, hypertension, remote CVA without known deficits.  Also with a history of substance abuse.  Brought in by her son to the ED 2/19 with fever, altered mental status and slurred speech. ? ?Apparently she had a flulike illness for a week, was found confused on 2/19.  Some suspicion by son that she may have been taking more prescription narcotics, also taking many NSAIDs per his report.  She was disoriented in the ED, febrile 103F, hyperglycemic. ?In the ED she apparently experienced acute hypoxemia, had some blood from her mouth, question emesis versus hemoptysis.  Devolved to PEA and required emergent ET intubation and CPR.  ROSC after 2 minutes.  OG tube placed with bright red blood obtained.  Chest x-ray with mild right hilar prominence, more notable on postintubation film.  No infiltrates or effusions ?She has received empiric antibiotics, is receiving 1 unit PRBC, is about to start Protonix infusion. ?Hemodynamically stabilized, mechanically ventilated. ? ?She remains critically ill. ? ?Pertinent  Medical History  ? ?Past Medical History:  ?Diagnosis Date  ? Anemia   ? Anxiety   ? Arthritis   ? Asthma   ? Cervical cancer ([place])   ? Chronic kidney disease   ? Coronary artery disease   ? Depression   ? Diabetes mellitus without complication ([place])   ? Dyspnea   ? Headache   ? Heart murmur   ? Hx of adenomatous polyp of colon   ? Hypertension   ? Rectovaginal fistula   ? Seizures ([place])   ? Stercoral ulcer of rectum 12/2020  ? Stroke [facility])   ? ? ?Significant Hospital Events: ?Including procedures, antibiotic start and stop dates in addition to other pertinent  events   ?2/19 Intubated. Head CT  > no acute CT findings, old left basal ganglia lacunar, old left frontal cortical and subcortical infarct ?MRI brain 2/21 numerous lesions with mild associated hemorrhage and edema. Neurosurgery reviewed imaging and patient not a good candidate for lesion aspiration. ?2/24 MR Brain - Numerous lesions with mild associated hemorrhage. Mild edema. Concerned for brain abscesses ?2/22 LP consistent with bacterial meningitis. Respiratory Cx +MRSA ?2/25 MR Liver with no lesions, hypoperfusion to the subcapsular right hepatic lobe, favor benign vascular phenomena, distended gallbladder, persistent region of hypoperfusion. EEG and LTM 2/25 neg for seizures.  ?Repeat head CT 2/27 with no acute changes  ?2/28 No acute events overnight, LTM read ? Seizure.  Cefepime switched to meropenem. ?3/2 LTM stopped. Meropenem changed to CTX. TEE + mobile mass on MV w/ mild to mod MR EF 60-65% ?3/3 change to IV heparin for possible trach  ? ?Interim History / Subjective:  ?Now post trach. ?Remains poorly response with some flicker reponse in RLE, opens eyes to voice/pain, upward gaze preference. ? ?Objective   ?Blood pressure (!) 159/71, pulse (!) 104, temperature 97.6 ?F (36.4 ?C), temperature source Oral, resp. rate 14, height '5\' 2"'$  (1.575 m), weight 77.8 kg, SpO2 100 %. ?   ?Vent Mode: PRVC ?FiO2 (%):  [60 %-100 %] 60 % ?Set Rate:  [28 bmp] 28 bmp ?Vt Set:  [400 mL] 400 mL ?PEEP:  [10 cmH20] 10 cmH20 ?Plateau Pressure:  [place].Sprinkle [id number]  cmH20] 20 cmH20  ? ?Intake/Output Summary (Last 24 hours) at 11/26/2021 0924 ?Last data filed at 11/26/2021 0900 ?Gross per 24 hour  ?Intake 3721.96 ml  ?Output 1000 ml  ?Net 2721.96 ml  ? ? ?Filed Weights  ? 11/24/21 0500 11/25/21 0500 11/26/21 0600  ?Weight: 77.2 kg 78 kg 77.8 kg  ? ? ?Physical Exam: ?No distress ?Mild anasarca ?Triggering vent ?Neuro exam as per subjective ? ?Sugars okay ?No CBC ?BMP okay ?CXR maybe slightly wet ? 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 ?Acute hypoxemic respiratory failure due to necrosing MRSA pneumonia ?Multifactorial encephalopathy in setting of MRSA CNS infection, sedation, post arrest,and sepsis.  LTVEEG d/c'd 3/1.  No active seizures, active focus in left frontal region ?Acute on chronic anemia, UGIB- s/p EGD on 2/19 showing erosive gastropathy ?Hx tobacco abuse ?PICC-associated DVT- on [facility], PICC removed ?Hx cervical Ca post nephrectomy and diverting colostomy ?S/P in ER PEA arrest 11/12/21 ?Hx CAD s/p LAD stent 2020 ?DM w/ hyperglycemia ?Depression ?CKD ?Seizure hx ?HTN ?Remote CVA ?Distant hx substance abuse ? ?- Prolonged abx per ID; PICC cannot place due to thrombi in both upper ext ?- Vent support, VAP prevention bundle, usual LTVV ?- Heparin gtt for now, After trach switch to eliquis x 3 mo for provoked DVT ?- PEG consult ?- Post trach bundle and work toward TC ?- Diuretic challenge ?- Will need LTACH  ? ?Best Practice (right click and "Reselect all SmartList Selections" daily)  ? ?Diet/type: tubefeeds ?DVT prophylaxis: heparin gtt ?GI prophylaxis: PPI ?Lines: N/A > PICC ?Foley:  N/A ?Code Status:  full code ?Last date of multidisciplinary goals of care discussion: pending ? ? ?Patient critically ill due to resp failure ?Interventions to address this today vent titration ?Risk of deterioration without these interventions is high ? ?I personally spent 34 minutes providing critical care not including any separately billable procedures ? ?Erskine Emery MD ?[facility] Pulmonary Critical Care ? ?Prefer epic messenger for cross cover needs ?If after hours, please call E-link ? ?

## 2021-11-26 NOTE — Progress Notes (Signed)
LTM EEG hooked up and running - no initial skin breakdown - push button tested - neuro notified. [facility] monitoring.  

## 2021-11-26 NOTE — Progress Notes (Signed)
ANTICOAGULATION CONSULT NOTE  ? ?Pharmacy Consult for heparin ?Indication:  Upper Extremity Thrombus ? ?Allergies  ?Allergen Reactions  ? Glimepiride Anaphylaxis  ?  Has sulfa in it per pharmacy  ? Sulfa Antibiotics Anaphylaxis and Hives  ? Effexor [Venlafaxine] Hives  ? ? ?Patient Measurements: ?Height: '5\' 2"'$  (157.5 cm) ?Weight: 77.8 kg (171 lb 8.3 oz) ?IBW/kg (Calculated) : 50.1 ? ? ?Vital Signs: ?Temp: 99.3 ?F (37.4 ?C) (03/05 1134) ?Temp Source: Oral (03/05 3716) ?BP: 125/60 (03/05 1516) ?Pulse Rate: 89 (03/05 1516) ? ?Labs: ?Recent Labs  ?  11/24/21 ?0141 11/25/21 ?0041 11/25/21 ?2315 11/26/21 ?0105 11/26/21 ?0949  ?HGB 10.3* 9.1* 10.5*  --   --   ?HCT 34.1* 30.3* 31.0*  --   --   ?PLT 285 298  --   --   --   ?HEPARINUNFRC  --  0.76*  --  0.21* 0.17*  ?CREATININE 0.83 0.80  --  0.72  --   ? ? ? ?Estimated Creatinine Clearance: 75.9 mL/min (by C-G formula based on SCr of 0.72 mg/dL). ? ? ?Medical History: ?Past Medical History:  ?Diagnosis Date  ? Anemia   ? Anxiety   ? Arthritis   ? Asthma   ? Cervical cancer ([place])   ? Chronic kidney disease   ? Coronary artery disease   ? Depression   ? Diabetes mellitus without complication ([place])   ? Dyspnea   ? Headache   ? Heart murmur   ? Hx of adenomatous polyp of colon   ? Hypertension   ? Rectovaginal fistula   ? Seizures ([place])   ? Stercoral ulcer of rectum 12/2020  ? Stroke [facility])   ? ? ? ? ?Assessment: ?87yof with MRSA bacteremia, endocarditis, and brain abscess.  S/p VDRF and new UE thrombus > PICC removed and treated with enoxaparin '1mg'$ /kg q12h.  Planning trach over the weekend will change to heparin drip for easier on/off anticoagulation insetting of procedures.   ? ?Heparin level below goal at 0.17 on heparin drip 1100 uts/hr - previously elevated heparin level could be from residual effect of enoxaparin - now cleared Hgb up to 10.5, no bleeding issues noted.  ? ?Goal of Therapy:  ?Heparin level 0.3-0.7 units/ml ?Monitor platelets by anticoagulation protocol: Yes ?   ?Plan:  ?Increase heparin to 1200 units/hr  ?Daily heparin level and CBC ?Monitor s/s bleeding  ? ? ?Bonnita Nasuti Pharm.D. CPP, BCPS ?Clinical Pharmacist ?[phone] ?11/26/2021 3:52 PM  ? ? ?

## 2021-11-26 NOTE — Progress Notes (Signed)
EEG complete - results pending 

## 2021-11-27 ENCOUNTER — Inpatient Hospital Stay (HOSPITAL_COMMUNITY): Payer: Medicaid Other

## 2021-11-27 DIAGNOSIS — I469 Cardiac arrest, cause unspecified: Secondary | ICD-10-CM | POA: Diagnosis not present

## 2021-11-27 DIAGNOSIS — G060 Intracranial abscess and granuloma: Secondary | ICD-10-CM | POA: Diagnosis not present

## 2021-11-27 DIAGNOSIS — R569 Unspecified convulsions: Secondary | ICD-10-CM | POA: Diagnosis not present

## 2021-11-27 LAB — PROTIME-INR
INR: 1.1 (ref 0.8–1.2)
Prothrombin Time: 13.9 seconds (ref 11.4–15.2)

## 2021-11-27 LAB — CBC
HCT: 31.2 % — ABNORMAL LOW (ref 36.0–46.0)
Hemoglobin: 9.6 g/dL — ABNORMAL LOW (ref 12.0–15.0)
MCH: 28.5 pg (ref 26.0–34.0)
MCHC: 30.8 g/dL (ref 30.0–36.0)
MCV: 92.6 fL (ref 80.0–100.0)
Platelets: 365 10*3/uL (ref 150–400)
RBC: 3.37 MIL/uL — ABNORMAL LOW (ref 3.87–5.11)
RDW: 19.4 % — ABNORMAL HIGH (ref 11.5–15.5)
WBC: 10.7 10*3/uL — ABNORMAL HIGH (ref 4.0–10.5)
nRBC: 0 % (ref 0.0–0.2)

## 2021-11-27 LAB — BASIC METABOLIC PANEL
Anion gap: 10 (ref 5–15)
BUN: 29 mg/dL — ABNORMAL HIGH (ref 6–20)
CO2: 29 mmol/L (ref 22–32)
Calcium: 8.2 mg/dL — ABNORMAL LOW (ref 8.9–10.3)
Chloride: 98 mmol/L (ref 98–111)
Creatinine, Ser: 0.77 mg/dL (ref 0.44–1.00)
GFR, Estimated: >60 mL/min (ref >60)
Glucose, Bld: 151 mg/dL — ABNORMAL HIGH (ref 70–99)
Potassium: 3.4 mmol/L — ABNORMAL LOW (ref 3.5–5.1)
Sodium: 137 mmol/L (ref 135–145)

## 2021-11-27 LAB — GLUCOSE, CAPILLARY
Glucose-Capillary: 107 mg/dL — ABNORMAL HIGH (ref 70–99)
Glucose-Capillary: 144 mg/dL — ABNORMAL HIGH (ref 70–99)

## 2021-11-27 LAB — HEPARIN LEVEL (UNFRACTIONATED)
Heparin Unfractionated: 0.17 IU/mL — ABNORMAL LOW (ref 0.30–0.70)
Heparin Unfractionated: 0.16 IU/mL — ABNORMAL LOW (ref 0.30–0.70)

## 2021-11-27 LAB — TRIGLYCERIDES: Triglycerides: 225 mg/dL — ABNORMAL HIGH (ref <150)

## 2021-11-27 MED ORDER — POTASSIUM CHLORIDE 20 MEQ PO PACK
40.0000 meq | PACK | Freq: Three times a day (TID) | ORAL | Status: AC
  Administered 2021-11-27 (×2): 40 meq
  Filled 2021-11-27 (×2): qty 2

## 2021-11-27 MED ORDER — VANCOMYCIN HCL IN DEXTROSE 1-5 GM/200ML-% IV SOLN
1000.0000 mg | Freq: Two times a day (BID) | INTRAVENOUS | Status: DC
  Administered 2021-11-27 – 2021-11-28 (×4): 1000 mg via INTRAVENOUS
  Filled 2021-11-27 (×5): qty 200

## 2021-11-27 MED ORDER — GABAPENTIN 250 MG/5ML PO SOLN
200.0000 mg | Freq: Two times a day (BID) | ORAL | Status: DC
  Administered 2021-11-27 (×2): 200 mg via ORAL
  Filled 2021-11-27 (×4): qty 4

## 2021-11-27 MED ORDER — FUROSEMIDE 10 MG/ML IJ SOLN
40.0000 mg | Freq: Once | INTRAMUSCULAR | Status: AC
  Administered 2021-11-27: 40 mg via INTRAVENOUS
  Filled 2021-11-27: qty 4

## 2021-11-27 NOTE — TOC Progression Note (Signed)
Transition of Care (TOC) - Progression Note  ? ? ?Patient Details  ?Name: Kristen Murphy ?MRN: [id number] ?Date of Birth: 08/17/1966 ? ?Transition of Care (TOC) CM/SW Contact  ?Bethena Roys, RN ?Phone Number: ?11/27/2021, 3:43 PM ? ?Clinical Narrative: Case Manager received a consult for LTAC. Patient has Medicaid and will not be a candidate for LTAC. Staff RN and MD aware. CSW will continue to follow for SNF placement. Case Manager will continue to follow for additional transition of care needs.  ? ?Expected Discharge Plan: [place] ?Barriers to Discharge: Continued Medical Work up ? ?Expected Discharge Plan and Services ?Expected Discharge Plan: [place] ?In-house Referral: Clinical Social Work ?Discharge Planning Services: CM Consult ?Post Acute Care Choice: [place] ?Living arrangements for the past 2 months: [place] ?                ? ?Readmission Risk Interventions ?Readmission Risk Prevention Plan 11/23/2021  ?Transportation Screening Complete  ?Medication Review Press photographer) Complete  ?SW Recovery Care/Counseling Consult Complete  ?Palliative Care Screening Complete  ?Some recent data might be hidden  ? ? ?

## 2021-11-27 NOTE — Progress Notes (Signed)
? ?NAME:  Kristen Murphy, MRN:  [id number], DOB:  06/27/1966, LOS: 3 ?ADMISSION DATE:  11/12/2021, CONSULTATION DATE: 11/12/2021 ?REFERRING MD: Dr. Langston Masker, CHIEF COMPLAINT: PEA arrest ? ?History of Present Illness:  ?56 year old woman with a history of tobacco use, cervical cancer ([facility]), post nephrectomy and colostomy, CAD with LAD stent 01/2019, diabetes, depression, hypertension, remote CVA without known deficits.  Also with a history of substance abuse.  Brought in by her son to the ED 2/19 with fever, altered mental status and slurred speech. ? ?Apparently she had a flulike illness for a week, was found confused on 2/19.  Some suspicion by son that she may have been taking more prescription narcotics, also taking many NSAIDs per his report.  She was disoriented in the ED, febrile 103F, hyperglycemic. ?In the ED she apparently experienced acute hypoxemia, had some blood from her mouth, question emesis versus hemoptysis.  Devolved to PEA and required emergent ET intubation and CPR.  ROSC after 2 minutes.  OG tube placed with bright red blood obtained.  Chest x-ray with mild right hilar prominence, more notable on postintubation film.  No infiltrates or effusions ?She has received empiric antibiotics, is receiving 1 unit PRBC, is about to start Protonix infusion. ?Hemodynamically stabilized, mechanically ventilated. ? ?She remains critically ill. ? ?Pertinent  Medical History  ? ?Past Medical History:  ?Diagnosis Date  ? Anemia   ? Anxiety   ? Arthritis   ? Asthma   ? Cervical cancer ([place])   ? Chronic kidney disease   ? Coronary artery disease   ? Depression   ? Diabetes mellitus without complication ([place])   ? Dyspnea   ? Headache   ? Heart murmur   ? Hx of adenomatous polyp of colon   ? Hypertension   ? Rectovaginal fistula   ? Seizures ([place])   ? Stercoral ulcer of rectum 12/2020  ? Stroke [facility])   ? ? ?Significant Hospital Events: ?Including procedures, antibiotic start and stop dates in addition to other pertinent  events   ?2/19 Intubated. Head CT  > no acute CT findings, old left basal ganglia lacunar, old left frontal cortical and subcortical infarct ?MRI brain 2/21 numerous lesions with mild associated hemorrhage and edema. Neurosurgery reviewed imaging and patient not a good candidate for lesion aspiration. ?2/24 MR Brain - Numerous lesions with mild associated hemorrhage. Mild edema. Concerned for brain abscesses ?2/22 LP consistent with bacterial meningitis. Respiratory Cx +MRSA ?2/25 MR Liver with no lesions, hypoperfusion to the subcapsular right hepatic lobe, favor benign vascular phenomena, distended gallbladder, persistent region of hypoperfusion. EEG and LTM 2/25 neg for seizures.  ?Repeat head CT 2/27 with no acute changes  ?2/28 No acute events overnight, LTM read ? Seizure.  Cefepime switched to meropenem. ?3/2 LTM stopped. Meropenem changed to CTX. TEE + mobile mass on MV w/ mild to mod MR EF 60-65% ?3/3 change to IV heparin for possible trach  ?3/4 trach placed ?3/5 ? Seizure activity, LTM restarted ? ?Interim History / Subjective:  ?Weaned several hours yesterday ?Remains on LTM, overnight- episodes of where her HR will jump to 130s, clench her jaw, upper ward gaze ongoing and then stills, and voids.   ? ?Objective   ?Blood pressure 132/68, pulse 99, temperature 99 ?F (37.2 ?C), temperature source Axillary, resp. rate (!) 21, height '5\' 2"'$  (1.575 m), weight 77.8 kg, SpO2 94 %. ?   ?Vent Mode: PRVC ?FiO2 (%):  [50 %-90 %] 50 % ?Set Rate:  [28 bmp]  28 bmp ?Vt Set:  [400 mL] 400 mL ?PEEP:  [8 cmH20-10 cmH20] 8 cmH20 ?Pressure Support:  [10 cmH20] 10 cmH20 ?Plateau Pressure:  [20 TFT73-22 cmH20] 20 cmH20  ? ?Intake/Output Summary (Last 24 hours) at 11/27/2021 0729 ?Last data filed at 11/27/2021 0700 ?Gross per 24 hour  ?Intake 2866.83 ml  ?Output 4300 ml  ?Net -1433.17 ml  ? ?Filed Weights  ? 11/25/21 0500 11/26/21 0600 11/27/21 0537  ?Weight: 78 kg 77.8 kg 77.8 kg  ? ?Physical Exam: ?Propofol 15, fent 50  mcg/hr ?General:  older female lying in bed in NAD ?HEENT: MM pink/moist, copious oral secretions, cortrak, pupils 4/reactive w/upward gaze, unable to fully visualize tongue but appears to have prior tongue laceration- no bleeding, midline sutured cuffed trach site wnl ?Neuro: flicker to noxious stimuli in lower extremities, otherwise no response to verbal, not f/c ?CV: rr, ST, no murmur ?PULM:  breathing over set MV rate, coarse, minimal clear secretions ?GI: soft, bs+, colostomy, ND/ NT, purwick   ?Extremities: warm/dry, UE +1-2 edema, LE +1  ?Skin: no rashes  ? ?Afebrile ?Diuresed 3.8L/ 24hrs  ?Labs: K 3.4, renal function stable, triglycerides 225, WBC 10.7, Hgb stable, normal coags ? ?Resolved Hospital Problem list   ?Hyponatremia ?Hypokalemia ?Hypocalcemia ?Septic shock resolved ?Urinary tract infection with Pseudomonas and Enterobacter (completed rx) ?Acute cardiopulmonary arrest, PEA arrest ? 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 ?Acute hypoxemic respiratory failure due to necrosing MRSA pneumonia ?Multifactorial encephalopathy in setting of MRSA CNS infection, sedation, post arrest,and sepsis.  LTVEEG d/c'd 3/1.  No active seizures, active focus in left frontal region ?Acute on chronic anemia, UGIB- s/p EGD on 2/19 showing erosive gastropathy ?Hx tobacco abuse ?PICC-associated DVT- on [facility], PICC removed ?Hx cervical Ca post nephrectomy and diverting colostomy ?S/P in ER PEA arrest 11/12/21 ?Hx CAD s/p LAD stent 2020 ?DM w/ hyperglycemia ?Depression ?CKD ?Seizure hx ?HTN ?Remote CVA ?Distant hx substance abuse ? ?- Prolonged abx per ID- remains on CTX/ linezolid; PICC cannot place due to thrombi in both upper ext ?- Vent support, VAP prevention bundle, goal towards TC ?- Heparin gtt for now, After PEG per IR on 3/7, switch to eliquis x 3 mo for provoked DVT ?- remains on LTM, neurology following, remains on vimpat/ keppra  ?- continue diuresis today ?- replete  electrolytes  ?- Will need LTACH, consult TOC ? ? ?Best Practice (right click and "Reselect all SmartList Selections" daily)  ? ?Diet/type: tubefeeds ?DVT prophylaxis: heparin gtt ?GI prophylaxis: PPI ?Lines: N/A  ?Foley:  N/A ?Code Status:  full code ?Last date of multidisciplinary goals of care discussion: pending ? ? ?CCT: 35 mins ? ? ?Kennieth Rad, ACNP ?[facility] ?11/27/2021, 7:29 AM ? ?See Amion for pager ?If no response to pager, please call PCCM consult pager ?After 7:00 pm call [place]   ? ? ?

## 2021-11-27 NOTE — Progress Notes (Signed)
ANTICOAGULATION CONSULT NOTE ? ?Pharmacy Consult for heparin ?Indication:  Upper Extremity Thrombus ? ?Allergies  ?Allergen Reactions  ? Glimepiride Anaphylaxis  ?  Has sulfa in it per pharmacy  ? Sulfa Antibiotics Anaphylaxis and Hives  ? Effexor [Venlafaxine] Hives  ? ? ?Patient Measurements: ?Height: '5\' 2"'$  (157.5 cm) ?Weight: 77.8 kg (171 lb 8.3 oz) ?IBW/kg (Calculated) : 50.1 ?Heparin dosing weight = 67 kg ? ?Vital Signs: ?Temp: 98.6 ?F (37 ?C) (03/06 1200) ?Temp Source: Axillary (03/06 1200) ?BP: 130/62 (03/06 1800) ?Pulse Rate: 76 (03/06 1800) ? ?Labs: ?Recent Labs  ?  11/25/21 ?0041 11/25/21 ?2315 11/26/21 ?0105 11/26/21 ?2620 11/27/21 ?3559 11/27/21 ?1743  ?HGB 9.1* 10.5*  --   --  9.6*  --   ?HCT 30.3* 31.0*  --   --  31.2*  --   ?PLT 298  --   --   --  365  --   ?LABPROT  --   --   --   --  13.9  --   ?INR  --   --   --   --  1.1  --   ?HEPARINUNFRC 0.76*  --  0.21* 0.17* 0.17* 0.16*  ?CREATININE 0.80  --  0.72  --  0.77  --   ? ? ? ?Estimated Creatinine Clearance: 75.9 mL/min (by C-G formula based on SCr of 0.77 mg/dL). ? ? ?Assessment: ?76yof with MRSA bacteremia, endocarditis, and brain abscess.  S/p VDRF and new UE thrombus > PICC removed and treated with enoxaparin '1mg'$ /kg q12h.  Planning trach, so changed to heparin drip for easier on/off anticoagulation insetting of procedures.   ? ?Heparin level remains below goal at 0.16 units/mL on heparin drip at 1350 units/hr.  No s/sx of bleeding or infusion issues per RN. ? ?Goal of Therapy:  ?Heparin level 0.3-0.7 units/ml ?Monitor platelets by anticoagulation protocol: Yes ?  ?Plan:  ?Increase heparin infusion to 1500 units/hr  ?Order heparin level in 6 hours ?Daily heparin level and CBC ?Monitor s/s bleeding  ? ?Monica Zahler D. Mina Marble, PharmD, BCPS, BCCCP ?11/27/2021, 7:28 PM ? ?

## 2021-11-27 NOTE — TOC Initial Note (Addendum)
Transition of Care (TOC) - Initial/Assessment Note  ? ? ?Patient Details  ?Name: Kristen Murphy ?MRN: [id number] ?Date of Birth: 10-03-1965 ? ?Transition of Care (TOC) CM/SW Contact:    ?Milas Gain, LCSWA ?Phone Number: ?11/27/2021, 4:39 PM ? ?Clinical Narrative:                 ? ?CSW spoke with patients daughter Kristen Murphy regarding possible Vent SNF placement for patient. Patients daughter reports patient comes from home alone. Patients daughter gave CSW permission to fax out initial referral to [facility] and [facility] when CSW is able. CSW informed patients daughter that patients Fio2% is currently at 50% and needs to be at 40% or lower before CSW can fax over referral for patient.Patients daughter reports patient has received both Covid Vaccines .Patients daughter unsure if patient has received any boosters. All questions answered. No further questions reported at this time. CSW will continue to follow and assist with patients dc planning needs. ? ?Expected Discharge Plan:  (trach snf) ?Barriers to Discharge: Continued Medical Work up ? ? ?Patient Goals and CMS Choice ?  ?CMS Medicare.gov Compare Post Acute Care list provided to:: Patient Represenative (must comment) (Patients daughter Kristen Murphy) ?Choice offered to / list presented to : Adult Children (Patients daughter) ? ?Expected Discharge Plan and Services ?Expected Discharge Plan:  (trach snf) ?In-house Referral: Clinical Social Work ?Discharge Planning Services: CM Consult ?Post Acute Care Choice: [place] ?Living arrangements for the past 2 months: [place] ?                ?  ?  ?  ?  ?  ?  ?  ?  ?  ?  ? ?Prior Living Arrangements/Services ?Living arrangements for the past 2 months: [place] ?Lives with:: Self ?Patient language and need for interpreter reviewed:: Yes ?Do you feel safe going back to the place where you live?: No   trach SNF  ?Need for Family Participation in Patient Care: Yes (Comment) ?Care giver  support system in place?: Yes (comment) ?  ?Criminal Activity/Legal Involvement Pertinent to Current Situation/Hospitalization: No - Comment as needed ? ?Activities of Daily Living ?Home Assistive Devices/Equipment: None ?ADL Screening (condition at time of admission) ?Patient's cognitive ability adequate to safely complete daily activities?: No ?Is the patient deaf or have difficulty hearing?: No ?Does the patient have difficulty seeing, even when wearing glasses/contacts?: No ?Does the patient have difficulty concentrating, remembering, or making decisions?: Yes ?Patient able to express need for assistance with ADLs?: No ?Does the patient have difficulty dressing or bathing?: Yes ?Independently performs ADLs?: No ?Communication: Dependent ?Is this a change from baseline?: Change from baseline, expected to last >3 days ?Dressing (OT): Dependent ?Is this a change from baseline?: Change from baseline, expected to last >3 days ?Grooming: Dependent ?Is this a change from baseline?: Change from baseline, expected to last >3 days ?Feeding: Dependent ?Is this a change from baseline?: Change from baseline, expected to last >3 days ?Bathing: Dependent ?Is this a change from baseline?: Change from baseline, expected to last >3 days ?Toileting: Dependent ?Is this a change from baseline?: Change from baseline, expected to last >3days ?In/Out Bed: Dependent ?Is this a change from baseline?: Change from baseline, expected to last >3 days ?Walks in Home: Dependent ?Is this a change from baseline?: Change from baseline, expected to last >3 days ?Does the patient have difficulty walking or climbing stairs?: Yes ?Weakness of Legs: Both ?Weakness of Arms/Hands: Both ? ?Permission  Sought/Granted ?Permission sought to share information with : Case Manager, Family Supports, Customer service manager ?Permission granted to share information with : No ? Share Information with NAME: Due to orientation spoke with patients daughter  Kristen Murphy ? Permission granted to share info w AGENCY: Due to orientation spoke with patients daughter Christy/trach snf ? Permission granted to share info w Relationship: Due to orientation spoke with patients daughter Kristen Murphy ? Permission granted to share info w Contact Information: Due to orientation spoke with patients daughter Kristen Murphy [phone] ? ?Emotional Assessment ?  ?Attitude/Demeanor/Rapport: Intubated (Following Commands or Not Following Commands) ?  ?Orientation: : Fluctuating Orientation (Suspected and/or reported [place]) ?Alcohol / Substance Use: Not Applicable ?Psych Involvement: No (comment) ? ?Admission diagnosis:  Cardiac arrest [facility]) [I46.9] ?Respiratory failure ([place]) [J96.90] ?Sepsis, due to unspecified organism, unspecified whether acute organ dysfunction present ([place]) [A41.9] ?Hematemesis, unspecified whether nausea present [K92.0] ?Patient Active Problem List  ? Diagnosis Date Noted  ? MRSA pneumonia ([place]) 11/15/2021  ? Brain abscess 11/15/2021  ? Cardiac arrest ([place]) 11/12/2021  ? Hematemesis   ? Respiratory failure ([place])   ? Sepsis ([place])   ? Stroke [facility])   ? Seizures ([place])   ? Hypertension   ? Heart murmur   ? Headache   ? Dyspnea   ? Diabetes mellitus without complication ([place])   ? Depression   ? Chronic kidney disease   ? Asthma   ? Coronary artery disease   ? Arthritis   ? Anxiety   ? Anemia   ? Cervical cancer ([place]) 01/26/2020  ? CAD in native artery 02/27/2019  ? Dyslipidemia (high LDL; Murphy HDL) 02/27/2019  ? Non-ST elevation (NSTEMI) myocardial infarction ([place]) 02/18/2019  ? NSTEMI (non-ST elevated myocardial infarction) ([place]) 02/18/2019  ? HTN (hypertension) 02/20/2017  ? DM (diabetes mellitus) ([place]) 02/20/2017  ? Cerebrovascular disease 02/20/2017  ? Tobacco use disorder 02/20/2017  ? Opioid use disorder, severe, dependence ([place]) 02/20/2017  ? Sedative, hypnotic or anxiolytic use disorder, severe, dependence ([place]) 02/20/2017  ? Sedative hypnotic withdrawal ([place]) 02/20/2017  ?  Methamphetamine use disorder, moderate ([place]) 02/20/2017  ? Alcohol use disorder, moderate, dependence ([place]) 02/20/2017  ? MDD (major depressive disorder) 02/19/2017  ? ?PCP:  Gweneth Fritter, FNP ?Pharmacy:   ?[facility] ?[address] ?[place] [address] ?Phone: [phone] Fax: [phone] ? ? ? ? ?Social Determinants of Health (SDOH) Interventions ?  ? ?Readmission Risk Interventions ?Readmission Risk Prevention Plan 11/23/2021  ?Transportation Screening Complete  ?Medication Review Press photographer) Complete  ?SW Recovery Care/Counseling Consult Complete  ?Palliative Care Screening Complete  ?Some recent data might be hidden  ? ? ? ?

## 2021-11-27 NOTE — Progress Notes (Signed)
Subjective: Captured multiple events, no definite eeg changes.  ? ?ROS: Unable to obtain due to poor mental status ? ?Examination ? ?Vital signs in last 24 hours: ?Temp:  [98.6 ?F (37 ?C)-99.2 ?F (37.3 ?C)] 98.6 ?F (37 ?C) (03/06 1200) ?Pulse Rate:  [75-123] 76 (03/06 1200) ?Resp:  [14-35] 14 (03/06 1200) ?BP: (104-171)/(55-90) 144/72 (03/06 1200) ?SpO2:  [89 %-100 %] 98 % (03/06 1200) ?FiO2 (%):  [50 %] 50 % (03/06 1142) ?Weight:  [77.8 kg] 77.8 kg (03/06 0537) ? ?General: lying in bed, eyes rolled upwards ?RS: Trach in place ?Neuro: Comatose, does not respond to noxious stimuli, does not follow commands, eyes rolled upwards, both pupils reacting to light, corneal reflex intact, withdraws to noxious stimuli in all 4 extremities ? ? ?Basic Metabolic Panel: ?Recent Labs  ?Lab 11/21/21 ?0346 11/22/21 ?0139 11/23/21 ?0612 11/24/21 ?0141 11/25/21 ?0041 11/25/21 ?2315 11/26/21 ?0105 11/27/21 ?0552  ?NA 143 142 143 134* 140 137 137 137  ?K 3.9 4.0 3.6 3.7 3.5 4.4 4.2 3.4*  ?CL 100 104 96* 93* 100  --  100 98  ?CO2 32 29 35* 30 32  --  28 29  ?GLUCOSE 227* 207* 203* 214* 197*  --  243* 151*  ?BUN 38* 41* 36* 41* 38*  --  34* 29*  ?CREATININE 0.81 0.79 0.91 0.83 0.80  --  0.72 0.77  ?CALCIUM 9.0 9.1 8.8* 8.4* 8.2*  --  8.2* 8.2*  ?MG 2.4 2.4 2.4 2.4  --   --   --   --   ?PHOS 2.6 1.8* 2.3* 3.3  --   --   --   --   ? ? ?CBC: ?Recent Labs  ?Lab 11/22/21 ?0139 11/23/21 ?0612 11/24/21 ?0141 11/25/21 ?0041 11/25/21 ?2315 11/27/21 ?0552  ?WBC 13.7* 15.4* 13.3* 10.0  --  10.7*  ?HGB 9.1* 10.2* 10.3* 9.1* 10.5* 9.6*  ?HCT 31.5* 33.9* 34.1* 30.3* 31.0* 31.2*  ?MCV 94.9 90.9 92.7 93.5  --  92.6  ?PLT 219 271 285 298  --  365  ? ? ? ?Coagulation Studies: ?Recent Labs  ?  11/27/21 ?0552  ?LABPROT 13.9  ?INR 1.1  ? ? ?Imaging ?No new brain imaging ? ?CT head without contrast 11/20/2021: 1. No change from recent head CT to explain the new deficit. Known numerous brain lesions and subacute right cerebral infarcts. ? ?MRI brain with and  without contrast 11/13/2021:  Numerous lesions in the brain. There are lesions in the cerebellar hemispheres and in both cerebral hemispheres widely distributed. Greater than 50 lesions are identified. The lesions have a rounded appearance with restricted diffusion and mild enhancement. Many lesions show evidence of mild associated hemorrhage. Mild edema. Restricted diffusion in the left lateral ventricle likely due to ?ventriculitis. ?  ?The pattern is most likely due to multiple brain abscesses ?particularly given the white count of 27. Differential diagnosis ?includes metastatic disease and demyelinating disease. Correlate ?with lumbar puncture results. ? ?ASSESSMENT AND PLAN: 56 year old female  with a history of cervical cancer, status post nephrectomy 2 months ago, colostomy, chronic occlusion of the left common carotid artery, CAD, diabetes, depression, hypertension, remote history of stroke without residual deficits who was brought in by EMS for altered mental status. She had had a flulike illness for a week PTA and was found confused on 2/19. The patient had acute PEA arrest while in the ED on Sunday (2/19) with CPR time of 2 minutes. Diagnosed with acute respiratory failure with hypoxemia and was intubated at that time.  MRI brain performed on Tuesday (2/21)revealed numerous cerebral abscesses, LP consistent with Meningitis.  Currently on IV vancomycin for 8 weeks (last date April 16).  Continues to have intermittent jerking episodes concerning for seizures therefore back on EEG. ? ?Acute bacterial meningitis ?Acute infectious encephalopathy ?Seizure ?Brain abscess ?Hypokalemia ?Elevated BUN ?Leukocytosis (improving) ?Microcytic anemia ?Hypertriglyceridemia ?Suspected autonomic dysfunction ?Tongue laceration ?- LTM EEG is suggestive of moderate to severe diffuse encephalopathy, nonspecific etiology.  No seizures or definite epileptiform discharges were seen during the study.  ?-Patient event button was  pressed on 11/26/2021 at 2149 for unclear reasons without concomitant EEG change.  This was most likely not an epileptic event ?-Patient event button was pressed on 11/26/2020 at 2253 for several whole-body tremors, tachycardia with heart rate of 130 and urinary incontinence.  No concomitant EEG changes seem to be in this episode. This was most likely not an epileptic event ?-Multiple episodes of Ott[place] backwards recording during the study without concomitant EEG change.  These episodes are most likely not epileptic ? ?Recommendations ?-Episodes of eye rolling are likely due to underlying lesions (stroke, abscess). Avoid using sedation for these. ?-Patient also reportedly has had episodes of whole body tremors with sudden increase in heart rate to 130, associated with urinary incontinence.  No EEG change was seen during these episodes.  These episodes could be due to autonomic dysfunction in setting of brain injury.  Would recommend starting gabapentin '300mg'$  twice daily.  Avoid sedation ?-Recommend continue to avoid sedation as much as possible to help with neuro prognosis.  Of note, patient has been off sedation for extended.  (Over 48 hours) in the past without significant improvement in mental status.  Given the prolonged status of this injury, I am concerned about significant long-term neurological injury.  We will continue to evaluate after she is off sedation for neuro prognostication ?-Continue LTM overnight.  Will likely DC tomorrow if no more events overnight ?-Continue Keppra and Vimpat at current dose ?-Continue seizure precautions ?-Management of rest of comorbidities per primary team ?-Discussed plan with Dr. Tamala Julian and patient's RN at bedside ? ?I have spent a total of 37  minutes with the patient reviewing hospital notes,  test results, labs and examining the patient as well as establishing an assessment and plan.  > 50% of time was spent in direct patient care. ?  ?  ? ?Zeb Comfort ?Epilepsy ?[facility] ?For questions after 5pm please refer to AMION to reach the Neurologist on call ? ?

## 2021-11-27 NOTE — Progress Notes (Signed)
SLP Cancellation Note ? ?Patient Details ?Name: Kristen Murphy ?MRN: [id number] ?DOB: August 03, 1966 ? ? ?Cancelled treatment:       Reason Eval/Treat Not Completed: Patient not medically ready- D/W RN. Will follow along in chart for readiness. ? ?Naksh Radi L. Ramses Klecka, MA CCC/SLP ?Acute Rehabilitation Services ?Office number [phone] ?Pager [phone] ? ? ? ?[name] ?11/27/2021, 11:22 AM ?

## 2021-11-27 NOTE — Progress Notes (Signed)
EEG maintenance performed.  No skin breakdown observed at electrode sites Fp1, Fp2. 

## 2021-11-27 NOTE — Progress Notes (Signed)
ANTICOAGULATION CONSULT NOTE  ? ?Pharmacy Consult for heparin ?Indication:  Upper Extremity Thrombus ? ?Allergies  ?Allergen Reactions  ? Glimepiride Anaphylaxis  ?  Has sulfa in it per pharmacy  ? Sulfa Antibiotics Anaphylaxis and Hives  ? Effexor [Venlafaxine] Hives  ? ? ?Patient Measurements: ?Height: '5\' 2"'$  (157.5 cm) ?Weight: 77.8 kg (171 lb 8.3 oz) ?IBW/kg (Calculated) : 50.1 ? ? ?Vital Signs: ?Temp: 98.8 ?F (37.1 ?C) (03/06 0800) ?Temp Source: Axillary (03/06 0800) ?BP: 123/67 (03/06 0800) ?Pulse Rate: 90 (03/06 0828) ? ?Labs: ?Recent Labs  ?  11/25/21 ?0041 11/25/21 ?2315 11/26/21 ?0105 11/26/21 ?1610 11/27/21 ?0552  ?HGB 9.1* 10.5*  --   --  9.6*  ?HCT 30.3* 31.0*  --   --  31.2*  ?PLT 298  --   --   --  365  ?LABPROT  --   --   --   --  13.9  ?INR  --   --   --   --  1.1  ?HEPARINUNFRC 0.76*  --  0.21* 0.17* 0.17*  ?CREATININE 0.80  --  0.72  --  0.77  ? ? ? ?Estimated Creatinine Clearance: 75.9 mL/min (by C-G formula based on SCr of 0.77 mg/dL). ? ? ?Medical History: ?Past Medical History:  ?Diagnosis Date  ? Anemia   ? Anxiety   ? Arthritis   ? Asthma   ? Cervical cancer ([place])   ? Chronic kidney disease   ? Coronary artery disease   ? Depression   ? Diabetes mellitus without complication ([place])   ? Dyspnea   ? Headache   ? Heart murmur   ? Hx of adenomatous polyp of colon   ? Hypertension   ? Rectovaginal fistula   ? Seizures ([place])   ? Stercoral ulcer of rectum 12/2020  ? Stroke [facility])   ? ? ? ? ?Assessment: ?60yof with MRSA bacteremia, endocarditis, and brain abscess.  S/p VDRF and new UE thrombus > PICC removed and treated with enoxaparin '1mg'$ /kg q12h.  Planning trach over the weekend will change to heparin drip for easier on/off anticoagulation insetting of procedures.   ? ?Heparin level below goal at 0.17, on heparin drip at 1200 units/hr. Hgb 9.6, plt 365. No s/sx of bleeding or infusion issues.  ? ?Goal of Therapy:  ?Heparin level 0.3-0.7 units/ml ?Monitor platelets by anticoagulation protocol: Yes ?   ?Plan:  ?Increase heparin infusion to 1350 units/hr  ?Order heparin level in 6 hours ?Daily heparin level and CBC ?Monitor s/s bleeding  ? ?Antonietta Jewel, PharmD, BCCCP ?Clinical Pharmacist  ?Phone: [phone] ?11/27/2021 10:08 AM ? ?Please check AMION for all [place] phone numbers ?After 10:00 PM, call [place] [phone] ? ? ? ?

## 2021-11-27 NOTE — Procedures (Addendum)
Patient Name: Kristen Murphy  ?MRN: [id number]  ?Epilepsy Attending: Lora Havens  ?Referring Physician/Provider: Donnetta Simpers, MD ?Duration: 11/26/2021 1841 to 11/27/2021 1841 ?  ?Patient history: 56 year old woman with a history of cervical cancer, status post nephrectomy 2 months ago, colostomy, CAD, diabetes, depression, hypertension, remote history of stroke without residual deficits who was brought in by EMS for altered mental status.  EEG to evaluate for seizure. ?  ?Level of alertness: lethargic ?  ?AEDs during EEG study: LEV, Vimpat, propofol ?  ?Technical aspects: This EEG study was done with scalp electrodes positioned according to the 10-20 International system of electrode placement. Electrical activity was acquired at a sampling rate of '500Hz'$  and reviewed with a high frequency filter of '70Hz'$  and a low frequency filter of '1Hz'$ . EEG data were recorded continuously and digitally stored.  ?  ?Description: E EEG showed near continuous generalized and maximal bifrontal sharply contoured 3 to 5 Hz theta-delta slowing.  Generalized sharp transients with triphasic morphology were seen intermittently throughout the study. Hyperventilation and photic stimulation were not performed.    ? ?Patient event button was pressed on 11/26/2021 at 2149 for unclear reasons (patient was behind curtain).  Concomitant EEG before, during and after the event did not show any EEG to suggest seizure ? ?Patient event button was pressed on 11/26/2021 at 2253 during which patient had subtle full body tremors per RN, her heart rate increased to 130 followed by urinary incontinence.  Concomitant EEG before, during and after the event did not show any EEG to the seizure. ? ?Multiple episodes of eye rolling backwards were recorded during the study without concomitant EEG change. ? ?Off note, study was technically difficult due to significant EKG artifact. ?   ?ABNORMALITY ?- Continuous slow, generalized  ?  ?IMPRESSION: ?This technically  difficult study is suggestive of moderate to severe diffuse encephalopathy, nonspecific etiology.  No seizures or definite epileptiform discharges were seen during the study. ? ?Patient event button was pressed on 11/26/2021 at 2149 for unclear reasons without concomitant EEG change.  This was most likely not an epileptic event ? ?Patient event button was pressed on 11/26/2020 at 2253 for several whole-body tremors, tachycardia with heart rate of 130 and urinary incontinence.  No concomitant EEG changes seem to be in this episode.  This was most likely not an epileptic event ? ?Multiple episodes of eye rolling backwards recording during the study without concomitant EEG change.  These episodes are most likely not epileptic ? ?Kristen Murphy [name]  ?

## 2021-11-27 NOTE — Progress Notes (Signed)
?    [facility] for Infectious Disease ? ? ?Reason for visit: follow up on brain abscess ? ?Interval History:  afebrile over the weekend; WBC 10.7. Remains unresponsive.  Possible seizure-like activity over the weekend ?Day 16 total antibiotics ?Day 5 ceftriaxone  ?Day 14 linezolid ? ?Physical Exam: ?Constitutional:  ?Vitals:  ? 11/27/21 0800 11/27/21 0828  ?BP: 123/67   ?Pulse: 87 90  ?Resp: (!) 25 (!) 29  ?Temp: 98.8 ?F (37.1 ?C)   ?SpO2: 95% 96%  ?No response ?HENT: +ET ?Respiratory: respiratory effort on vent ? ?Review of Systems: ?Unable to be assessed due to patient factors ? ?Lab Results  ?Component Value Date  ? WBC 10.7 (H) 11/27/2021  ? HGB 9.6 (L) 11/27/2021  ? HCT 31.2 (L) 11/27/2021  ? MCV 92.6 11/27/2021  ? PLT 365 11/27/2021  ?  ?Lab Results  ?Component Value Date  ? CREATININE 0.77 11/27/2021  ? BUN 29 (H) 11/27/2021  ? NA 137 11/27/2021  ? K 3.4 (L) 11/27/2021  ? CL 98 11/27/2021  ? CO2 29 11/27/2021  ?  ?Lab Results  ?Component Value Date  ? ALT 25 11/16/2021  ? AST 19 11/16/2021  ? ALKPHOS 202 (H) 11/16/2021  ?  ? ?Microbiology: ?No results found for this or any previous visit (from the past 240 hour(s)). ? ? ?Impression/Plan:  ?1. Brain abscesses - multiple and no positive cultures other than the MRSA in the lungs.  I still suspect MRSA very likely but would like to have seen it grow out in the CSF.  With the uncertainty, I will continue the ceftriaxone as well as MRSA coverage and plan for a prolonged course of antibiotics and use IV vancomycin in place of linezolid since it will be long term.   ?With the brain findings, I recommend treatment for 8 weeks total through April 16 or can stop sooner if a repeat MRI is done and shows resolution.   ? ?2.  Pulmonary opacities - pneumonia or septic emboli, though TV without concerns.  This has been treated with linezolid and likely resolved.  On antibiotics regardless as above.  ? ?3.  Medication monitoring - on vancomycin and will monitor levels  and creat.   ? ? ? ?  ?

## 2021-11-27 NOTE — Progress Notes (Signed)
Per ICU RN, pt tube feeds were not turned off at [facility] for procedure. Advised that we will delay procedure until tomorrow 11/28/21 and Rad PA will place new orders. ICU RN Acknowledged. ?

## 2021-11-27 NOTE — Progress Notes (Addendum)
Pharmacy Antibiotic Note ? ?Kristen Murphy is a 56 y.o. female admitted on 11/12/2021 with multiple brain abscesses.  Pharmacy has been consulted for vancomycin dosing. ? ?ID following ? ?D16 total antibiotics - D14 linezolid, D4 Ceftriaxone ?WBC 10.7, afebrile ?SCr 0.77 - at baseline ? ?3/2 TEE: mobile vegetation on MV, no PFO ?CSF culture negative ? ?Plan: ?Stop Linezolid (d/t potential toxicities with long-term treatment and h/o VRE UTI likely contaminant) ?Start Vancomycin 1000 mg IV every 12 hours (nomogram dosing) ?Ceftriaxone 2g IV every 12 hours (CNS dosing per ID) ?ID planning for 4 weeks of antibiotics thorugh 01/07/22 ?Monitor renal function, CBC, cultures/sensitivities ? ?Temp (24hrs), Avg:99.1 ?F (37.3 ?C), Min:98.8 ?F (37.1 ?C), Max:99.3 ?F (37.4 ?C) ? ?Recent Labs  ?Lab 11/22/21 ?0139 11/23/21 ?0612 11/24/21 ?0141 11/25/21 ?0041 11/26/21 ?0105 11/27/21 ?0552  ?WBC 13.7* 15.4* 13.3* 10.0  --  10.7*  ?CREATININE 0.79 0.91 0.83 0.80 0.72 0.77  ?  ?Estimated Creatinine Clearance: 75.9 mL/min (by C-G formula based on SCr of 0.77 mg/dL).   ? ?Allergies  ?Allergen Reactions  ? Glimepiride Anaphylaxis  ?  Has sulfa in it per pharmacy  ? Sulfa Antibiotics Anaphylaxis and Hives  ? Effexor [Venlafaxine] Hives  ? ? ?Antimicrobials this admission: ? ?Rocephin 2/19 >> 2/21, 3/2> ?Vancomycin 3/6 >>  ?Acyclovir 2/19 >> 2/21 ?Vanc 2/19 >> 2/22 ?Cefepime 2/21 >> 2/27 ?Flagyl 2/21 >> 2/27 ?Linezolid 2/22 > 3/6 ?Meropenem 2/27 >>3/2 ? ?Dose adjustments this admission: ?none ? ?Microbiology results: ?2/22 CSF - neg ?2/19 BCx: neg ?2/19 Ucx pseudomonas pan sens, enterobacter cefaz -R, S -to all others ?2/20 BAL > few MRSA ?2/19 MRSA PCR > detected  ? ?Thank you for allowing pharmacy to be a part of this patient?s care. ? ?Laurey Arrow, PharmD ?PGY1 Pharmacy Resident ?11/27/2021  9:53 AM ? ?Please check [email or web address] for unit-specific pharmacy phone numbers. ? ?

## 2021-11-27 NOTE — Plan of Care (Signed)

## 2021-11-28 ENCOUNTER — Inpatient Hospital Stay (HOSPITAL_COMMUNITY): Payer: Medicaid Other

## 2021-11-28 DIAGNOSIS — I469 Cardiac arrest, cause unspecified: Secondary | ICD-10-CM | POA: Diagnosis not present

## 2021-11-28 DIAGNOSIS — R569 Unspecified convulsions: Secondary | ICD-10-CM | POA: Diagnosis not present

## 2021-11-28 DIAGNOSIS — G060 Intracranial abscess and granuloma: Secondary | ICD-10-CM | POA: Diagnosis not present

## 2021-11-28 HISTORY — PX: IR GASTROSTOMY TUBE MOD SED: IMG625

## 2021-11-28 LAB — RENAL FUNCTION PANEL
Albumin: 2.1 g/dL — ABNORMAL LOW (ref 3.5–5.0)
Anion gap: 10 (ref 5–15)
BUN: 26 mg/dL — ABNORMAL HIGH (ref 6–20)
CO2: 29 mmol/L (ref 22–32)
Calcium: 8.6 mg/dL — ABNORMAL LOW (ref 8.9–10.3)
Chloride: 99 mmol/L (ref 98–111)
Creatinine, Ser: 0.63 mg/dL (ref 0.44–1.00)
GFR, Estimated: >60 mL/min (ref >60)
Glucose, Bld: 88 mg/dL (ref 70–99)
Phosphorus: 3.1 mg/dL (ref 2.5–4.6)
Potassium: 4 mmol/L (ref 3.5–5.1)
Sodium: 138 mmol/L (ref 135–145)

## 2021-11-28 LAB — CBC
HCT: 32.7 % — ABNORMAL LOW (ref 36.0–46.0)
Hemoglobin: 9.9 g/dL — ABNORMAL LOW (ref 12.0–15.0)
MCH: 28.2 pg (ref 26.0–34.0)
MCHC: 30.3 g/dL (ref 30.0–36.0)
MCV: 93.2 fL (ref 80.0–100.0)
Platelets: 388 10*3/uL (ref 150–400)
RBC: 3.51 MIL/uL — ABNORMAL LOW (ref 3.87–5.11)
RDW: 19.3 % — ABNORMAL HIGH (ref 11.5–15.5)
WBC: 8.6 10*3/uL (ref 4.0–10.5)
nRBC: 0 % (ref 0.0–0.2)

## 2021-11-28 LAB — GLUCOSE, CAPILLARY
Glucose-Capillary: 104 mg/dL — ABNORMAL HIGH (ref 70–99)
Glucose-Capillary: 122 mg/dL — ABNORMAL HIGH (ref 70–99)
Glucose-Capillary: 134 mg/dL — ABNORMAL HIGH (ref 70–99)
Glucose-Capillary: 148 mg/dL — ABNORMAL HIGH (ref 70–99)
Glucose-Capillary: 154 mg/dL — ABNORMAL HIGH (ref 70–99)
Glucose-Capillary: 155 mg/dL — ABNORMAL HIGH (ref 70–99)
Glucose-Capillary: 158 mg/dL — ABNORMAL HIGH (ref 70–99)
Glucose-Capillary: 169 mg/dL — ABNORMAL HIGH (ref 70–99)
Glucose-Capillary: 173 mg/dL — ABNORMAL HIGH (ref 70–99)
Glucose-Capillary: 182 mg/dL — ABNORMAL HIGH (ref 70–99)
Glucose-Capillary: 245 mg/dL — ABNORMAL HIGH (ref 70–99)
Glucose-Capillary: 50 mg/dL — ABNORMAL LOW (ref 70–99)

## 2021-11-28 LAB — HEPARIN LEVEL (UNFRACTIONATED): Heparin Unfractionated: 0.13 IU/mL — ABNORMAL LOW (ref 0.30–0.70)

## 2021-11-28 LAB — MAGNESIUM: Magnesium: 2.3 mg/dL (ref 1.7–2.4)

## 2021-11-28 MED ORDER — GABAPENTIN 250 MG/5ML PO SOLN
300.0000 mg | Freq: Two times a day (BID) | ORAL | Status: DC
  Administered 2021-11-29 – 2021-12-05 (×13): 300 mg
  Filled 2021-11-28 (×14): qty 6

## 2021-11-28 MED ORDER — INSULIN ASPART 100 UNIT/ML IJ SOLN
0.0000 [IU] | INTRAMUSCULAR | Status: DC
  Administered 2021-11-28 (×3): 2 [IU] via SUBCUTANEOUS
  Administered 2021-11-29 (×3): 1 [IU] via SUBCUTANEOUS
  Administered 2021-11-29 – 2021-11-30 (×6): 2 [IU] via SUBCUTANEOUS
  Administered 2021-11-30 (×2): 1 [IU] via SUBCUTANEOUS
  Administered 2021-12-01 (×2): 2 [IU] via SUBCUTANEOUS
  Administered 2021-12-01: 1 [IU] via SUBCUTANEOUS
  Administered 2021-12-01: 3 [IU] via SUBCUTANEOUS
  Administered 2021-12-02 (×2): 2 [IU] via SUBCUTANEOUS
  Administered 2021-12-02 (×3): 1 [IU] via SUBCUTANEOUS
  Administered 2021-12-03: 2 [IU] via SUBCUTANEOUS
  Administered 2021-12-03: 3 [IU] via SUBCUTANEOUS
  Administered 2021-12-03: 2 [IU] via SUBCUTANEOUS
  Administered 2021-12-03: 3 [IU] via SUBCUTANEOUS
  Administered 2021-12-04: 1 [IU] via SUBCUTANEOUS
  Administered 2021-12-04: 3 [IU] via SUBCUTANEOUS
  Administered 2021-12-04 (×2): 2 [IU] via SUBCUTANEOUS
  Administered 2021-12-04: 3 [IU] via SUBCUTANEOUS
  Administered 2021-12-05: 2 [IU] via SUBCUTANEOUS
  Administered 2021-12-05: 1 [IU] via SUBCUTANEOUS
  Administered 2021-12-05: 2 [IU] via SUBCUTANEOUS

## 2021-11-28 MED ORDER — INSULIN GLARGINE-YFGN 100 UNIT/ML ~~LOC~~ SOLN
10.0000 [IU] | Freq: Two times a day (BID) | SUBCUTANEOUS | Status: DC
  Filled 2021-11-28 (×2): qty 0.1

## 2021-11-28 MED ORDER — INSULIN GLARGINE-YFGN 100 UNIT/ML ~~LOC~~ SOLN: 10.0000 [IU] | Freq: Two times a day (BID) | SUBCUTANEOUS | Status: DC

## 2021-11-28 MED ORDER — LIDOCAINE HCL 1 % IJ SOLN
INTRAMUSCULAR | Status: AC
  Filled 2021-11-28: qty 20

## 2021-11-28 MED ORDER — DEXTROSE 50 % IV SOLN
25.0000 g | INTRAVENOUS | Status: AC
  Administered 2021-11-28: 25 g via INTRAVENOUS
  Filled 2021-11-28: qty 50

## 2021-11-28 MED ORDER — HEPARIN (PORCINE) 25000 UT/250ML-% IV SOLN
1900.0000 [IU]/h | INTRAVENOUS | Status: AC
  Administered 2021-11-28: 18:00:00 1800 [IU]/h via INTRAVENOUS
  Administered 2021-11-29: 1900 [IU]/h via INTRAVENOUS
  Filled 2021-11-28 (×2): qty 250

## 2021-11-28 MED ORDER — IOHEXOL 300 MG/ML  SOLN
100.0000 mL | Freq: Once | INTRAMUSCULAR | Status: AC | PRN
  Administered 2021-11-28: 15 mL

## 2021-11-28 MED ORDER — CEFAZOLIN SODIUM-DEXTROSE 2-4 GM/100ML-% IV SOLN
INTRAVENOUS | Status: AC
  Administered 2021-11-28: 2000 mg
  Filled 2021-11-28: qty 100

## 2021-11-28 MED ORDER — GABAPENTIN 250 MG/5ML PO SOLN
300.0000 mg | Freq: Two times a day (BID) | ORAL | Status: DC
  Administered 2021-11-28: 300 mg via ORAL
  Filled 2021-11-28 (×2): qty 6

## 2021-11-28 MED ORDER — FUROSEMIDE 40 MG PO TABS
40.0000 mg | ORAL_TABLET | Freq: Two times a day (BID) | ORAL | Status: DC
  Administered 2021-11-28 – 2021-12-05 (×14): 40 mg
  Filled 2021-11-28 (×14): qty 1

## 2021-11-28 NOTE — TOC Progression Note (Addendum)
Transition of Care (TOC) - Progression Note  ? ? ?Patient Details  ?Name: Kristen Murphy ?MRN: [id number] ?Date of Birth: 01/03/1966 ? ?Transition of Care (TOC) CM/SW Contact  ?Milas Gain, LCSWA ?Phone Number: ?11/28/2021, 11:03 AM ? ?Clinical Narrative:    ? ?Update- Patients passr currently pending. CSW faxed over requested clinicals to [place] must for review. CSW faxed over referral to Santiago Glad with [facility] for review. Santiago Glad with [facility] confirmed she received referral. CSW will follow up with Santiago Glad with [facility] tomorrow on referral status. ? ?Update- CSW spoke with Santiago Glad from [facility] who informed CSW patient will need to be at Fio2 40% on vent. Santiago Glad informed CSW that CSW can go ahead and fax over referral for review. Patients passr is currently pending. CSW waiting for 30 day note and FL2 to be signed by MD to fax over requested clinicals to [place] must for review. ? ?CSW spoke with Danae Chen at [facility] who confirmed they do not accept managed medicaid. CSW called [facility] rehab to verify if they will accept patients insurance for possible Vent SNF placement. CSW LVM for Santiago Glad with [facility] awaiting callback. CSW will continue to follow and assist with patients dc planning needs. ? ?Expected Discharge Plan:  (trach snf) ?Barriers to Discharge: Continued Medical Work up ? ?Expected Discharge Plan and Services ?Expected Discharge Plan:  (trach snf) ?In-house Referral: Clinical Social Work ?Discharge Planning Services: CM Consult ?Post Acute Care Choice: [place] ?Living arrangements for the past 2 months: [place] ?                ?  ?  ?  ?  ?  ?  ?  ?  ?  ?  ? ? ?Social Determinants of Health (SDOH) Interventions ?  ? ?Readmission Risk Interventions ?Readmission Risk Prevention Plan 11/23/2021  ?Transportation Screening Complete  ?Medication Review Press photographer) Complete  ?SW Recovery Care/Counseling Consult Complete  ?Palliative Care Screening Complete  ?Some recent data  might be hidden  ? ? ?

## 2021-11-28 NOTE — Progress Notes (Signed)
ANTICOAGULATION CONSULT NOTE ? ?Pharmacy Consult for heparin ?Indication:  Upper Extremity Thrombus ? ?Allergies  ?Allergen Reactions  ? Glimepiride Anaphylaxis  ?  Has sulfa in it per pharmacy  ? Sulfa Antibiotics Anaphylaxis and Hives  ? Effexor [Venlafaxine] Hives  ? ? ?Patient Measurements: ?Height: '5\' 2"'$  (157.5 cm) ?Weight: 74.3 kg (163 lb 12.8 oz) ?IBW/kg (Calculated) : 50.1 ?Heparin dosing weight = 67 kg ? ?Vital Signs: ?Temp: 98.3 ?F (36.8 ?C) (03/07 1128) ?Temp Source: Oral (03/07 1128) ?BP: 180/67 (03/07 1525) ?Pulse Rate: 122 (03/07 1525) ? ?Labs: ?Recent Labs  ?  11/25/21 ?2315 11/26/21 ?0105 11/26/21 ?1448 11/27/21 ?1856 11/27/21 ?1743 11/28/21 ?0128  ?HGB 10.5*  --   --  9.6*  --  9.9*  ?HCT 31.0*  --   --  31.2*  --  32.7*  ?PLT  --   --   --  365  --  388  ?LABPROT  --   --   --  13.9  --   --   ?INR  --   --   --  1.1  --   --   ?HEPARINUNFRC  --  0.21*   < > 0.17* 0.16* 0.13*  ?CREATININE  --  0.72  --  0.77  --  0.63  ? < > = values in this interval not displayed.  ? ? ? ?Estimated Creatinine Clearance: 74.1 mL/min (by C-G formula based on SCr of 0.63 mg/dL). ? ? ?Assessment: ?72yof with MRSA bacteremia, endocarditis, and brain abscess.  S/p VDRF and new UE thrombus > PICC removed and treated with enoxaparin '1mg'$ /kg q12h.  Planning trach, so changed to heparin drip for easier on/off anticoagulation insetting of procedures.   ? ?Patient is s/p PEG placement today 3/7.  Spoke to IR, pharmacy to resume IV heparin at 1800.  No bleeding reported. ? ?Goal of Therapy:  ?Heparin level 0.3-0.7 units/ml ?Monitor platelets by anticoagulation protocol: Yes ?  ?Plan:  ?At 1800, resume heparin infusion at 1800 units/hr - no bolus ?Order heparin level post resumption ?Daily heparin level and CBC ?Monitor s/sx of bleeding  ? ?Kaycen Whitworth D. Mina Marble, PharmD, BCPS, BCCCP ?11/28/2021, 4:49 PM ? ?

## 2021-11-28 NOTE — Progress Notes (Signed)
Subjective: No events overnight.  No concerns. Per RN ?ROS: negative except above ? ?Examination ? ?Vital signs in last 24 hours: ?Temp:  [97.9 ?F (36.6 ?C)-98.6 ?F (37 ?C)] 97.9 ?F (36.6 ?C) (03/07 0400) ?Pulse Rate:  [62-106] 64 (03/07 0800) ?Resp:  [14-24] 17 (03/07 0800) ?BP: (90-170)/(37-84) 120/56 (03/07 0800) ?SpO2:  [88 %-100 %] 98 % (03/07 1101) ?FiO2 (%):  [50 %] 50 % (03/07 1101) ?Weight:  [74.3 kg] 74.3 kg (03/07 0500) ? ?General: lying in bed, eyes rolled upwards ?RS: Trach in place ?Neuro: Comatose, eyes roll back with noxious stimuli, does not follow commands, eyes rolled upwards, both pupils reacting to light, corneal reflex intact, withdraws to noxious stimuli in all 4 extremities ? ?Basic Metabolic Panel: ?Recent Labs  ?Lab 11/22/21 ?0139 11/23/21 ?0612 11/24/21 ?0141 11/25/21 ?0041 11/25/21 ?2315 11/26/21 ?0105 11/27/21 ?2505 11/28/21 ?0128  ?NA 142 143 134* 140 137 137 137 138  ?K 4.0 3.6 3.7 3.5 4.4 4.2 3.4* 4.0  ?CL 104 96* 93* 100  --  100 98 99  ?CO2 29 35* 30 32  --  '28 29 29  '$ ?GLUCOSE 207* 203* 214* 197*  --  243* 151* 88  ?BUN 41* 36* 41* 38*  --  34* 29* 26*  ?CREATININE 0.79 0.91 0.83 0.80  --  0.72 0.77 0.63  ?CALCIUM 9.1 8.8* 8.4* 8.2*  --  8.2* 8.2* 8.6*  ?MG 2.4 2.4 2.4  --   --   --   --  2.3  ?PHOS 1.8* 2.3* 3.3  --   --   --   --  3.1  ? ? ?CBC: ?Recent Labs  ?Lab 11/23/21 ?0612 11/24/21 ?0141 11/25/21 ?0041 11/25/21 ?2315 11/27/21 ?3976 11/28/21 ?0128  ?WBC 15.4* 13.3* 10.0  --  10.7* 8.6  ?HGB 10.2* 10.3* 9.1* 10.5* 9.6* 9.9*  ?HCT 33.9* 34.1* 30.3* 31.0* 31.2* 32.7*  ?MCV 90.9 92.7 93.5  --  92.6 93.2  ?PLT 271 285 298  --  365 388  ? ? ? ?Coagulation Studies: ?Recent Labs  ?  11/27/21 ?0552  ?LABPROT 13.9  ?INR 1.1  ? ? ?Imaging ?No new brain imaging ? ? ?ASSESSMENT AND PLAN: 56 year old female  with a history of cervical cancer, status post nephrectomy 2 months ago, colostomy, chronic occlusion of the left common carotid artery, CAD, diabetes, depression, hypertension, remote  history of stroke without residual deficits who was brought in by EMS for altered mental status. She had had a flulike illness for a week PTA and was found confused on 2/19. The patient had acute PEA arrest while in the ED on Sunday (2/19) with CPR time of 2 minutes. Diagnosed with acute respiratory failure with hypoxemia and was intubated at that time. MRI brain performed on Tuesday (2/21)revealed numerous cerebral abscesses, LP consistent with Meningitis.  Currently on IV vancomycin for 8 weeks (last date April 16).  Continues to have intermittent jerking episodes concerning for seizures therefore back on EEG. ?  ?Acute bacterial meningitis ?Acute infectious encephalopathy ?Seizure ?Brain abscess ?-Seizure-like episodes overnight ? ?Recommendations ?-Continue gabapentin '300mg'$  twice daily.  Avoid sedation ?-Recommend continue to avoid sedation as much as possible to help with neuro prognosis.  Of note, patient has been off sedation for extended.  (Over 48 hours) in the past without significant improvement in mental status.  Given the prolonged status of this injury, I am concerned about significant long-term neurological injury.  We will continue to evaluate after she is off sedation for neuro prognostication ?-Discontinue LTM  EEG as no events overnight ?-Continue Keppra and Vimpat at current dose ?-Continue seizure precautions ?-Management of rest of comorbidities per primary team ?-Discussed plan with patient's RN at bedside ?  ?Zeb Comfort ?Epilepsy ?[facility] ?For questions after 5pm please refer to AMION to reach the Neurologist on call ? ?

## 2021-11-28 NOTE — Progress Notes (Signed)
Patient in IR for Gastric Tube Placement.  Patient currently on a Fentanyl drip with ICU nurse and Respiratory at bedside in IR.  Patient is calm and tolerating the procedure well.  Performed by Dr. Kathlene Cote. ? ?

## 2021-11-28 NOTE — Progress Notes (Signed)
ANTICOAGULATION CONSULT NOTE - Follow Up Consult ? ?Pharmacy Consult for heparin ?Indication:  UE thrombus ? ?Labs: ?Recent Labs  ?  11/25/21 ?2315 11/26/21 ?0105 11/26/21 ?5456 11/27/21 ?2563 11/27/21 ?1743 11/28/21 ?0128  ?HGB 10.5*  --   --  9.6*  --  9.9*  ?HCT 31.0*  --   --  31.2*  --  32.7*  ?PLT  --   --   --  365  --  388  ?LABPROT  --   --   --  13.9  --   --   ?INR  --   --   --  1.1  --   --   ?HEPARINUNFRC  --  0.21*   < > 0.17* 0.16* 0.13*  ?CREATININE  --  0.72  --  0.77  --  0.63  ? < > = values in this interval not displayed.  ? ? ?Assessment: ?56yo female subtherapeutic on heparin after rate change; no infusion issues or signs of bleeding per RN. ? ?Goal of Therapy:  ?Heparin level 0.3-0.7 units/ml ?  ?Plan:  ?Will increase heparin infusion by 3 units/kg/hr to 1800 units/hr and check level in 6 hours.   ? ?Wynona Neat, PharmD, BCPS  ?11/28/2021,4:09 AM ? ? ?

## 2021-11-28 NOTE — Sedation Documentation (Signed)
No sedation from IR nursing due to patient being on a Fentanyl drip.  Patient tolerated well with no issues.  ICU nurse and respiratiory at bedside. ? ?

## 2021-11-28 NOTE — Progress Notes (Addendum)
? ?NAME:  Kristen Murphy, MRN:  [id number], DOB:  08/10/1966, LOS: 72 ?ADMISSION DATE:  11/12/2021, CONSULTATION DATE: 11/12/2021 ?REFERRING MD: Dr. Langston Masker, CHIEF COMPLAINT: PEA arrest ? ?History of Present Illness:  ?56 year old woman with a history of tobacco use, cervical cancer ([facility]), post nephrectomy and colostomy, CAD with LAD stent 01/2019, diabetes, depression, hypertension, remote CVA without known deficits.  Also with a history of substance abuse.  Brought in by her son to the ED 2/19 with fever, altered mental status and slurred speech. ? ?Apparently she had a flulike illness for a week, was found confused on 2/19.  Some suspicion by son that she may have been taking more prescription narcotics, also taking many NSAIDs per his report.  She was disoriented in the ED, febrile 103F, hyperglycemic. ?In the ED she apparently experienced acute hypoxemia, had some blood from her mouth, question emesis versus hemoptysis.  Devolved to PEA and required emergent ET intubation and CPR.  ROSC after 2 minutes.  OG tube placed with bright red blood obtained.  Chest x-ray with mild right hilar prominence, more notable on postintubation film.  No infiltrates or effusions ?She has received empiric antibiotics, is receiving 1 unit PRBC, is about to start Protonix infusion. ?Hemodynamically stabilized, mechanically ventilated. ? ?She remains critically ill. ? ?Pertinent  Medical History  ? ?Past Medical History:  ?Diagnosis Date  ? Anemia   ? Anxiety   ? Arthritis   ? Asthma   ? Cervical cancer ([place])   ? Chronic kidney disease   ? Coronary artery disease   ? Depression   ? Diabetes mellitus without complication ([place])   ? Dyspnea   ? Headache   ? Heart murmur   ? Hx of adenomatous polyp of colon   ? Hypertension   ? Rectovaginal fistula   ? Seizures ([place])   ? Stercoral ulcer of rectum 12/2020  ? Stroke [facility])   ? ? ?Significant Hospital Events: ?Including procedures, antibiotic start and stop dates in addition to other pertinent  events   ?2/19 Intubated. Head CT  > no acute CT findings, old left basal ganglia lacunar, old left frontal cortical and subcortical infarct ?MRI brain 2/21 numerous lesions with mild associated hemorrhage and edema. Neurosurgery reviewed imaging and patient not a good candidate for lesion aspiration. ?2/24 MR Brain - Numerous lesions with mild associated hemorrhage. Mild edema. Concerned for brain abscesses ?2/22 LP consistent with bacterial meningitis. Respiratory Cx +MRSA ?2/25 MR Liver with no lesions, hypoperfusion to the subcapsular right hepatic lobe, favor benign vascular phenomena, distended gallbladder, persistent region of hypoperfusion. EEG and LTM 2/25 neg for seizures.  ?Repeat head CT 2/27 with no acute changes  ?2/28 No acute events overnight, LTM read ? Seizure.  Cefepime switched to meropenem. ?3/2 LTM stopped. Meropenem changed to CTX. TEE + mobile mass on MV w/ mild to mod MR EF 60-65% ?3/3 change to IV heparin for possible trach  ?3/4 trach placed ?3/5 ? Seizure activity, LTM restarted, weaned 8 hrs  ?3/6 remains on LTM- no seizure to correlate with episodes of HR 130s, jaw clenching, urinary incontinence.  Linezolid changed to vanc per ID for longterm therapy  ? ?Interim History / Subjective:  ?Hypoglycemic overnight after TF held for PEG today with IR ?Heparin levels remain subtherapeutic  ?No events over night ?Off propofol   ? ?Objective   ?Blood pressure (!) 152/37, pulse 81, temperature 97.9 ?F (36.6 ?C), temperature source Axillary, resp. rate 19, height '5\' 2"'$  (1.575 m), weight  74.3 kg, SpO2 96 %. ?   ?Vent Mode: PRVC ?FiO2 (%):  [50 %] 50 % ?Set Rate:  [20 bmp-28 bmp] 20 bmp ?Vt Set:  [400 mL] 400 mL ?PEEP:  [8 cmH20] 8 cmH20 ?Plateau Pressure:  [13 cmH20-17 cmH20] 17 cmH20  ? ?Intake/Output Summary (Last 24 hours) at 11/28/2021 0756 ?Last data filed at 11/28/2021 0600 ?Gross per 24 hour  ?Intake 2035.64 ml  ?Output 2250 ml  ?Net -214.36 ml  ? ?Filed Weights  ? 11/26/21 0600 11/27/21 0537  11/28/21 0500  ?Weight: 77.8 kg 77.8 kg 74.3 kg  ? ?Physical Exam: ?fent 50 mcg/hr ?General:  Older female lying in bed in NAD ?HEENT: MM pink/moist, copious oral secretions, unable to assess tongue for ?necrosis after prior tongue biting, midline shiley cuffed trach, sutured, site wnl, pupils 5/reactive, anicertic, upward gaze, no blink to threat, + corneals  ?Neuro:  eyes open with mouth care or turning, more extensor posturing in LUE vs RUE, flicker vs triple reflex in BLE ?CV: rr, NSR, +2 pulses/ warm ?PULM:  flipped to PSV and weaned to 5/5/ 40% but sats down to 88 with good TV 500s/ Mve 8-9, therefore increased back to 50%/ 8 peep, lungs coarse, wheeze, some whitish secretions ?GI: soft, bs+, NT/ ND, ostomy ?Extremities: warm/dry, improving, trace LE edema  ?Skin: no rashes ? ?Afebrile ?Diuresed 1.9L/ 24hrs + 1 unmeasured urinary occurrence  ?Wts 77.8> 74.3kg  ?Labs: K 4, renal function stable, WBC 10.7> 8.6, Hgb stable, heparin levels subtherapeutic  ? ? ?Resolved Hospital Problem list   ?Hyponatremia ?Hypokalemia ?Hypocalcemia ?Septic shock resolved ?Urinary tract infection with Pseudomonas and Enterobacter (completed rx) ?Acute cardiopulmonary arrest, PEA arrest ? 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 ?Acute hypoxemic respiratory failure due to necrosing MRSA pneumonia ?Multifactorial encephalopathy in setting of MRSA CNS infection, sedation, post arrest,and sepsis.  LTVEEG d/c'd 3/1.  No active seizures, active focus in left frontal region ?Acute on chronic anemia, UGIB- s/p EGD on 2/19 showing erosive gastropathy ?Hx tobacco abuse ?PICC-associated DVT- on [facility], PICC removed ?Hx cervical Ca post nephrectomy and diverting colostomy ?S/P in ER PEA arrest 11/12/21 ?Hx CAD s/p LAD stent 2020 ?DM w/ hyperglycemia ?Depression ?CKD ?Seizure hx ?HTN ?Remote CVA ?Distant hx substance abuse ? ?- Prolonged abx per ID- remains on CTX/ vanc (changed from linezolid 3/6 per ID  given prolonged coarse, 8 weeks to stop around 01/07/22; PICC cannot place due to thrombi in both upper extremities ?- heparin per pharmacy, after PEG can transition to eliquis x 3 months  ?- Vent support, VAP prevention bundle, goal towards TC.  Diuresed last 2 days with not much progress on weaning peep/ FiO2.  Secretions remains unchanged.  Continue daily SBT trials ?- minimizing sedation as able.  Off propofol, minimize fentanyl as needed (has mostly been for vent dyssynchrony/ jaw clenching), RASS goal 0/-1 w/ bowel regimen ?- PEG today per IR.  Can d/c cortrak afterwards ?- remains on LTM, neurology following, remains on vimpat/ keppra iw/ seizure precautions.  No evidence of seizure activity to correlate with her episode of tachycardia, jaw clenching, urinary incontinence  ?- long term neuro prognostication remains guarded ?- hold on diureses today  ?- replete electrolytes as needed, goal K > 4, Mag > 2 ?- TOC consulted for long term placement.  Will need FiO2 < 40 prior to placement referral ?- given hypoglycemia overnight, will hold long acting insulin till tomorrow as well as TF coverage and reduce SSI  to sensitive.  Adjust as needed, goal FBG < 180 ? ? ?Best Practice (right click and "Reselect all SmartList Selections" daily)  ? ?Diet/type: tubefeeds- currently on hold for PEG today ?DVT prophylaxis: heparin gtt per pharmacy ?GI prophylaxis: PPI ?Lines: N/A - PIV only ?Foley:  N/A- purwick ?Code Status:  full code ?Last date of multidisciplinary goals of care discussion: pending ? ? ?CCT: 35 mins ? ? ?Kennieth Rad, ACNP ?[facility] ?11/28/2021, 7:56 AM ? ?See Amion for pager ?If no response to pager, please call PCCM consult pager ?After 7:00 pm call Elink   ? ? ?

## 2021-11-28 NOTE — Procedures (Signed)
Interventional Radiology Procedure Note ? ?Procedure: Gastrostomy tube placement ? ?Complications: None ? ?Estimated Blood Loss: < 10 mL ? ?Findings: ?24 Fr bumper retention gastrostomy tube placed with tip in body of stomach. OK to use in 24 hours. ? ?Eulas Post T. Kathlene Cote, M.D ?Pager:  [phone] ? ?  ?

## 2021-11-28 NOTE — Progress Notes (Signed)
LTM D/C'd. No skin breakdown noted. [facility] was notified.  ?

## 2021-11-28 NOTE — Progress Notes (Signed)
RT transported patient from 2H07 to IR and back with RN. No complications. RT will continue to monitor.  ?

## 2021-11-28 NOTE — Progress Notes (Signed)
Nutrition Follow-up ? ?DOCUMENTATION CODES:  ? ?Not applicable ? ?INTERVENTION:  ? ?Tube Feeding via PEG:  ?Vital AF 1.2 at 65 ml/hr ?This provides 117 g of protein, 1872 kcals, 1264 mL of free water ?  ? ?NUTRITION DIAGNOSIS:  ? ?Inadequate oral intake related to acute illness as evidenced by NPO status. ? ?Progressing ? ?GOAL:  ? ?Patient will meet greater than or equal to 90% of their needs ? ?Being addressed via TF  ? ?MONITOR:  ? ?Vent status, Labs, Weight trends, TF tolerance ? ?REASON FOR ASSESSMENT:  ? ?Ventilator ?  ? ?ASSESSMENT:  ? ?56 yo female admitted with encephalopathy, acute PEA arrest, upper GI bleed with hemoptysis. 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 ?2/19 Admitted, Intubated, EGD: clots in stomach but no bleeding lesion found, erosive gastropathy; CT abdomen with liver mass. Head CT no acute findings.  ?2/21 MRI brain with numerous lesions, mild edema, concerned for brain abscesses ?2/22 LP consistent with bacterial meningitis ?2/25 MRI LIver with no lesions ?2/27 Head CT-no new changes ? ?TOC Team working on discharge plan ? ?Pt remains on vent support ? ?Hypoglycemic overnight after TF held for PEG tube placement today ?Previously tolerating Vital AF 1.2 at 65 ml/hr ? ?Current wt 74.3 kg  ? ?Labs: CBGs 50-245 ?Meds: colace, lasix, ss novolog, novolog, semglee ? ? ? ?Diet Order:   ?Diet Order   ? ?       ?  Diet NPO time specified  Diet effective midnight       ?  ? ?  ?  ? ?  ? ? ?EDUCATION NEEDS:  ? ?Not appropriate for education at this time ? ?Skin:  Skin Assessment: Reviewed RN Assessment ? ?Last BM:  +stool via colostomy ? ?Height:  ? ?Ht Readings from Last 1 Encounters:  ?11/12/21 '5\' 2"'$  (1.575 m)  ? ? ?Weight:  ? ?Wt Readings from Last 1 Encounters:  ?11/28/21 74.3 kg  ? ? ? ?BMI:  Body mass index is 29.96 kg/m?. ? ?Estimated Nutritional Needs:  ? ?Kcal:  1800-2000  kcals ? ?Protein:  115-130 g ? ?Fluid:  >/= 1.8 L ? ? ?Kerman Passey MS, RDN, LDN, CNSC ?Registered Dietitian III ?Clinical Nutrition ?RD Pager and On-Call Pager Number Located in [place]  ? ?

## 2021-11-28 NOTE — Progress Notes (Addendum)
RE: Kristen Murphy ? ?Date of Birth: 11/07/1965 ? ?Date: 11/28/2021 ? ?To Whom It May Concern: ? ?Please be advised that the above-named patient will require a short-term nursing home stay - anticipated 30 days or less for rehabilitation and strengthening. The plan is for return home. ?

## 2021-11-28 NOTE — Progress Notes (Signed)
No suitable veins found in right arm.  Left arm restricted. RN advised of findings. ?

## 2021-11-28 NOTE — [place] FL2 (Addendum)
?[place] MEDICAID FL2 LEVEL OF CARE SCREENING TOOL  ?  ? ?IDENTIFICATION  ?Patient Name: ?Kristen Murphy Birthdate: 07/23/1966 Sex: female Admission Date (Current Location): ?11/12/2021  ?[place] and [place] Number: ? [place] ?  Facility and Address:  ?[facility], [place] [address], [place], [place] [address] ?     Provider Number: ?[id number]  ?Attending Physician Name and Address:  ?Candee Furbish, MD ? Relative Name and Phone Number:  ?  ?   ?Current Level of Care: ?Hospital Recommended Level of Care: ?[facility] Prior Approval Number: ?  ? ?Date Approved/Denied: ?  PASRR Number: ?[id number] H ? ?Discharge Plan: ?Other (Comment) ([facility]) ?  ? ?Current Diagnoses: ?Patient Active Problem List  ? Diagnosis Date Noted  ? MRSA pneumonia ([place]) 11/15/2021  ? Brain abscess 11/15/2021  ? Cardiac arrest ([place]) 11/12/2021  ? Hematemesis   ? Respiratory failure ([place])   ? Sepsis ([place])   ? Stroke [facility])   ? Seizures ([place])   ? Hypertension   ? Heart murmur   ? Headache   ? Dyspnea   ? Diabetes mellitus without complication ([place])   ? Depression   ? Chronic kidney disease   ? Asthma   ? Coronary artery disease   ? Arthritis   ? Anxiety   ? Anemia   ? Cervical cancer ([place]) 01/26/2020  ? CAD in native artery 02/27/2019  ? Dyslipidemia (high LDL; low HDL) 02/27/2019  ? Non-ST elevation (NSTEMI) myocardial infarction ([place]) 02/18/2019  ? NSTEMI (non-ST elevated myocardial infarction) ([place]) 02/18/2019  ? HTN (hypertension) 02/20/2017  ? DM (diabetes mellitus) ([place]) 02/20/2017  ? Cerebrovascular disease 02/20/2017  ? Tobacco use disorder 02/20/2017  ? Opioid use disorder, severe, dependence ([place]) 02/20/2017  ? Sedative, hypnotic or anxiolytic use disorder, severe, dependence ([place]) 02/20/2017  ? Sedative hypnotic withdrawal ([place]) 02/20/2017  ? Methamphetamine use disorder, moderate ([place]) 02/20/2017  ? Alcohol use disorder, moderate, dependence ([place]) 02/20/2017  ? MDD (major depressive disorder) 02/19/2017   ? ? ?Orientation RESPIRATION BLADDER Height & Weight   ?  ? (intubated/trach) ? Vent (currently at FiO2  50%) Incontinent, External catheter (External Urinary Catheter) Weight: 163 lb 12.8 oz (74.3 kg) ?Height:  '5\' 2"'  (157.5 cm)  ?BEHAVIORAL SYMPTOMS/MOOD NEUROLOGICAL BOWEL NUTRITION STATUS  ?    Incontinent Diet (Please see discharge summary)  ?AMBULATORY STATUS COMMUNICATION OF NEEDS Skin   ?Total Care  (intubated/trach) Other (Comment) (appropriate for ethnicity,dry,intact) ?  ?  ?  ?    ?     ?     ? ? ?Personal Care Assistance Level of Assistance  ?Bathing, Feeding, Dressing Bathing Assistance: Maximum assistance ?Feeding assistance: Maximum assistance (Peg Tube) ?Dressing Assistance: Maximum assistance ?Total Care Assistance: Maximum assistance  ? ?Functional Limitations Info  ?Sight, Hearing, Speech   ?  ?Speech Info: Impaired (intubated/trach)  ? ? ?SPECIAL CARE FACTORS FREQUENCY  ?PT (By licensed PT), OT (By licensed OT)   ?  ?PT Frequency: 2x min weekly ?OT Frequency: 2x min weekly ?  ?  ?  ?   ? ? ?Contractures Contractures Info: Not present  ? ? ?Additional Factors Info  ?Code Status, Allergies, Insulin Sliding Scale Code Status Info: Full ?Allergies Info: Glimepiride,Sulfa Antibiotics,Effexor (venlafaxine) ?  ?Insulin Sliding Scale Info: insulin aspart (novoLOG) injection 0-9 Units every 4 hours,insulin aspart (novoLOG) injection 12 Units every 4 hours,insulin glargine-yfgn (SEMGLEE) injection 10 Units 2 times daily ?  ?   ? ?Current Medications (11/28/2021):  This  is the current hospital active medication list ?Current Facility-Administered Medications  ?Medication Dose Route Frequency Provider Last Rate Last Admin  ? 0.9 %  sodium chloride infusion   Intravenous PRN Margaretha Seeds, MD   Stopped at 11/21/21 1033  ? 0.9 %  sodium chloride infusion   Intravenous Continuous Sande Rives E, PA-C 20 mL/hr at 11/23/21 1620 New Bag at 11/23/21 1620  ? 0.9 %  sodium chloride infusion  250 mL Intravenous  Continuous Candee Furbish, MD   Stopped at 11/26/21 1648  ? acetaminophen (TYLENOL) tablet 650 mg  650 mg Oral Q4H PRN Lestine Mount, PA-C   650 mg at 11/24/21 1016  ? Or  ? acetaminophen (TYLENOL) 160 MG/5ML solution 650 mg  650 mg Per Tube Q4H PRN Lestine Mount, PA-C   650 mg at 11/26/21 0010  ? Or  ? acetaminophen (TYLENOL) suppository 650 mg  650 mg Rectal Q4H PRN Lestine Mount, PA-C      ? albuterol (PROVENTIL) (2.5 MG/3ML) 0.083% nebulizer solution 2.5 mg  2.5 mg Nebulization Q2H PRN Jennelle Human B, NP   2.5 mg at 11/28/21 0810  ? amLODipine (NORVASC) tablet 10 mg  10 mg Per Tube Daily Noemi Chapel P, DO   10 mg at 11/27/21 0857  ? artificial tears (LACRILUBE) ophthalmic ointment   Both Eyes Q8H Estill Cotta, NP   1 application. at 11/28/21 0557  ? cefTRIAXone (ROCEPHIN) 2 g in sodium chloride 0.9 % 100 mL IVPB  2 g Intravenous Q12H Ralph Dowdy, [facility]   Stopped at 11/28/21 0308  ? chlorhexidine gluconate (MEDLINE KIT) (PERIDEX) 0.12 % solution 15 mL  15 mL Mouth Rinse BID Jennelle Human B, NP   15 mL at 11/28/21 0800  ? Chlorhexidine Gluconate Cloth 2 % PADS 6 each  6 each Topical Daily Collene Gobble, MD   6 each at 11/27/21 2331  ? docusate (COLACE) 50 MG/5ML liquid 100 mg  100 mg Per Tube BID Nevada Crane M, PA-C   100 mg at 11/27/21 2112  ? feeding supplement (VITAL AF 1.2 CAL) liquid 1,000 mL  1,000 mL Per Tube Continuous Margaretha Seeds, MD 65 mL/hr at 11/27/21 0000 Infusion Verify at 11/27/21 0000  ? fentaNYL (SUBLIMAZE) injection 50-200 mcg  50-200 mcg Intravenous Q30 min PRN Estill Cotta, NP   50 mcg at 11/25/21 1523  ? fentaNYL 2570mg in NS 2535m(1071mml) infusion-PREMIX  0-400 mcg/hr Intravenous Continuous SmiCandee FurbishD 2.5 mL/hr at 11/28/21 1200 25 mcg/hr at 11/28/21 1200  ? furosemide (LASIX) tablet 40 mg  40 mg Per Tube BID SmiCandee FurbishD      ? gabapentin (NEURONTIN) 250 MG/5ML solution 300 mg  300 mg Oral Q12H YadLora HavensD      ?  hydrALAZINE (APRESOLINE) injection 5 mg  5 mg Intravenous Q4H PRN EubOmar PersonP   5 mg at 11/21/21 1219  ? insulin aspart (novoLOG) injection 0-9 Units  0-9 Units Subcutaneous Q4H SimJennelle Human NP   2 Units at 11/28/21 1258  ? insulin aspart (novoLOG) injection 12 Units  12 Units Subcutaneous Q4H BabErick ColaceP   12 Units at 11/28/21 0012  ? [START ON 11/29/2021] insulin glargine-yfgn (SEMGLEE) injection 10 Units  10 Units Subcutaneous BID SimJennelle Human NP      ? lacosamide (VIMPAT) 100 mg in sodium chloride 0.9 % 25 mL IVPB  100 mg Intravenous Q12H KhaDonnetta Simpers  MD   Stopped at 11/28/21 1026  ? levETIRAcetam (KEPPRA) IVPB 1500 mg/ 100 mL premix  1,500 mg Intravenous Q12H Etheleen Nicks, MD   Stopped at 11/28/21 [phone]  ? MEDLINE mouth rinse  15 mL Mouth Rinse 10 times per day Jennelle Human B, NP   15 mL at 11/28/21 1258  ? metoprolol tartrate (LOPRESSOR) 25 mg/10 mL oral suspension 25 mg  25 mg Per Tube BID Noemi Chapel P, DO   25 mg at 11/27/21 2113  ? midazolam (VERSED) injection 2 mg  2 mg Intravenous Q2H PRN Estill Cotta, NP   2 mg at 11/26/21 1116  ? pantoprazole sodium (PROTONIX) 40 mg/20 mL oral suspension 40 mg  40 mg Per Tube Daily Margaretha Seeds, MD   40 mg at 11/27/21 0908  ? polyethylene glycol (MIRALAX / GLYCOLAX) packet 17 g  17 g Per Tube Daily PRN Jennelle Human B, NP      ? polyethylene glycol (MIRALAX / GLYCOLAX) packet 17 g  17 g Per Tube Daily [place] Crane M, PA-C   17 g at 11/26/21 1047  ? propofol (DIPRIVAN) 1000 MG/100ML infusion  5-80 mcg/kg/min Intravenous Titrated Candee Furbish, MD   Stopped at 11/27/21 1057  ? vancomycin (VANCOCIN) IVPB 1000 mg/200 mL premix  1,000 mg Intravenous Q12H Ralph Dowdy, [facility]   Stopped at 11/28/21 1131  ? ? ? ?Discharge Medications: ?Please see discharge summary for a list of discharge medications. ? ?Relevant Imaging Results: ? ?Relevant Lab Results: ? ? ?Additional Information ?[phone], Both Covid  Vaccines ? ?Milas Gain, LCSWA ? ? ? ? ?

## 2021-11-28 NOTE — Procedures (Addendum)
Patient Name: Kristen Murphy  ?MRN: [id number]  ?Epilepsy Attending: Lora Havens  ?Referring Physician/Provider: Donnetta Simpers, MD ?Duration: 11/27/2021 1841 to 11/28/2021 1129 ?  ?Patient history: 56 year old woman with a history of cervical cancer, status post nephrectomy 2 months ago, colostomy, CAD, diabetes, depression, hypertension, remote history of stroke without residual deficits who was brought in by EMS for altered mental status.  EEG to evaluate for seizure. ?  ?Level of alertness: lethargic ?  ?AEDs during EEG study: LEV, Vimpat, GBP ?  ?Technical aspects: This EEG study was done with scalp electrodes positioned according to the 10-20 International system of electrode placement. Electrical activity was acquired at a sampling rate of '500Hz'$  and reviewed with a high frequency filter of '70Hz'$  and a low frequency filter of '1Hz'$ . EEG data were recorded continuously and digitally stored.  ?  ?Description: EEG showed near continuous generalized and maximal bifrontal sharply contoured 3 to 5 Hz theta-delta slowing.  Generalized sharp transients with triphasic morphology were seen intermittently throughout the study. Hyperventilation and photic stimulation were not performed.    ?    ?ABNORMALITY ?- Continuous slow, generalized  ?  ?IMPRESSION: ?This study is suggestive of moderate to severe diffuse encephalopathy, nonspecific etiology.  No seizures or definite epileptiform discharges were seen during the study. ?   ?Lora Havens  ?

## 2021-11-29 DIAGNOSIS — G060 Intracranial abscess and granuloma: Secondary | ICD-10-CM | POA: Diagnosis not present

## 2021-11-29 LAB — GLUCOSE, CAPILLARY
Glucose-Capillary: 144 mg/dL — ABNORMAL HIGH (ref 70–99)
Glucose-Capillary: 152 mg/dL — ABNORMAL HIGH (ref 70–99)
Glucose-Capillary: 146 mg/dL — ABNORMAL HIGH (ref 70–99)
Glucose-Capillary: 144 mg/dL — ABNORMAL HIGH (ref 70–99)
Glucose-Capillary: 174 mg/dL — ABNORMAL HIGH (ref 70–99)
Glucose-Capillary: 182 mg/dL — ABNORMAL HIGH (ref 70–99)

## 2021-11-29 LAB — CBC
HCT: 33.3 % — ABNORMAL LOW (ref 36.0–46.0)
Hemoglobin: 9.8 g/dL — ABNORMAL LOW (ref 12.0–15.0)
MCH: 27.8 pg (ref 26.0–34.0)
MCHC: 29.4 g/dL — ABNORMAL LOW (ref 30.0–36.0)
MCV: 94.3 fL (ref 80.0–100.0)
Platelets: 457 10*3/uL — ABNORMAL HIGH (ref 150–400)
RBC: 3.53 MIL/uL — ABNORMAL LOW (ref 3.87–5.11)
RDW: 19.6 % — ABNORMAL HIGH (ref 11.5–15.5)
WBC: 10.2 10*3/uL (ref 4.0–10.5)
nRBC: 0 % (ref 0.0–0.2)

## 2021-11-29 LAB — BASIC METABOLIC PANEL
Anion gap: 10 (ref 5–15)
BUN: 22 mg/dL — ABNORMAL HIGH (ref 6–20)
CO2: 26 mmol/L (ref 22–32)
Calcium: 8.6 mg/dL — ABNORMAL LOW (ref 8.9–10.3)
Chloride: 97 mmol/L — ABNORMAL LOW (ref 98–111)
Creatinine, Ser: 0.67 mg/dL (ref 0.44–1.00)
GFR, Estimated: >60 mL/min (ref >60)
Glucose, Bld: 160 mg/dL — ABNORMAL HIGH (ref 70–99)
Potassium: 4.6 mmol/L (ref 3.5–5.1)
Sodium: 133 mmol/L — ABNORMAL LOW (ref 135–145)

## 2021-11-29 LAB — VANCOMYCIN, TROUGH: Vancomycin Tr: 27 ug/mL (ref 15–20)

## 2021-11-29 LAB — SODIUM: Sodium: 134 mmol/L — ABNORMAL LOW (ref 135–145)

## 2021-11-29 LAB — MAGNESIUM: Magnesium: 2.1 mg/dL (ref 1.7–2.4)

## 2021-11-29 LAB — HEPARIN LEVEL (UNFRACTIONATED)
Heparin Unfractionated: 0.22 IU/mL — ABNORMAL LOW (ref 0.30–0.70)
Heparin Unfractionated: 0.64 IU/mL (ref 0.30–0.70)

## 2021-11-29 MED ORDER — APIXABAN 5 MG PO TABS
5.0000 mg | ORAL_TABLET | Freq: Two times a day (BID) | ORAL | Status: DC
Start: 2021-12-06 — End: 2021-12-05

## 2021-11-29 MED ORDER — INSULIN GLARGINE-YFGN 100 UNIT/ML ~~LOC~~ SOLN
10.0000 [IU] | Freq: Two times a day (BID) | SUBCUTANEOUS | Status: DC
  Administered 2021-11-29: 10 [IU] via SUBCUTANEOUS
  Filled 2021-11-29 (×3): qty 0.1

## 2021-11-29 MED ORDER — OXYCODONE HCL 5 MG PO TABS
5.0000 mg | ORAL_TABLET | Freq: Four times a day (QID) | ORAL | Status: DC
  Administered 2021-11-29 – 2021-12-05 (×24): 5 mg
  Filled 2021-11-29 (×25): qty 1

## 2021-11-29 MED ORDER — FENTANYL CITRATE PF 50 MCG/ML IJ SOSY
50.0000 ug | PREFILLED_SYRINGE | INTRAMUSCULAR | Status: DC | PRN
  Administered 2021-12-01 – 2021-12-02 (×4): 50 ug via INTRAVENOUS
  Filled 2021-11-29 (×3): qty 1

## 2021-11-29 MED ORDER — FENTANYL CITRATE PF 50 MCG/ML IJ SOSY
50.0000 ug | PREFILLED_SYRINGE | INTRAMUSCULAR | Status: DC | PRN
  Administered 2021-11-30 – 2021-12-01 (×2): 50 ug via INTRAVENOUS
  Filled 2021-11-29 (×3): qty 1

## 2021-11-29 MED ORDER — MIDAZOLAM HCL 2 MG/2ML IJ SOLN
2.0000 mg | INTRAMUSCULAR | Status: DC | PRN
  Administered 2021-11-30 – 2021-12-05 (×9): 2 mg via INTRAVENOUS
  Filled 2021-11-29 (×9): qty 2

## 2021-11-29 MED ORDER — APIXABAN 5 MG PO TABS
10.0000 mg | ORAL_TABLET | Freq: Two times a day (BID) | ORAL | Status: DC
  Administered 2021-11-29 – 2021-12-05 (×12): 10 mg
  Filled 2021-11-29 (×12): qty 2

## 2021-11-29 MED ORDER — VANCOMYCIN HCL 1250 MG/250ML IV SOLN
1250.0000 mg | INTRAVENOUS | Status: DC
  Administered 2021-11-29 – 2021-12-01 (×3): 1250 mg via INTRAVENOUS
  Filled 2021-11-29 (×4): qty 250

## 2021-11-29 NOTE — Progress Notes (Addendum)
ANTICOAGULATION CONSULT NOTE - Follow Up Consult ? ?Pharmacy Consult for heparin ?Indication:  upper extremity thrombus ? ?Allergies  ?Allergen Reactions  ? Glimepiride Anaphylaxis  ?  Has sulfa in it per pharmacy  ? Sulfa Antibiotics Anaphylaxis and Hives  ? Effexor [Venlafaxine] Hives  ? ? ?Patient Measurements: ?Height: '5\' 2"'$  (157.5 cm) ?Weight: 74.3 kg (163 lb 12.8 oz) ?IBW/kg (Calculated) : 50.1 ?Heparin Dosing Weight: 67 kg ? ?Vital Signs: ?BP: 147/80 (03/08 0200) ?Pulse Rate: 83 (03/08 0300) ? ?Labs: ?Recent Labs  ?  11/27/21 ?6144 11/27/21 ?1743 11/28/21 ?0128 11/29/21 ?0157  ?HGB 9.6*  --  9.9* 9.8*  ?HCT 31.2*  --  32.7* 33.3*  ?PLT 365  --  388 457*  ?LABPROT 13.9  --   --   --   ?INR 1.1  --   --   --   ?HEPARINUNFRC 0.17* 0.16* 0.13* 0.22*  ?CREATININE 0.77  --  0.63 0.67  ? ? ?Estimated Creatinine Clearance: 74.1 mL/min (by C-G formula based on SCr of 0.67 mg/dL). ? ?Assessment: ?28yof with MRSA bacteremia, endocarditis, and brain abscess. S/p VDRF and new UE thrombus > PICC removed and treated with enoxaparin '1mg'$ /kg q12h. Planning trach, so changed to heparin drip for easier on/off anticoagulation insetting of procedures. ? ?Patient is s/p PEG placement 3/7. Spoke to IR, IV heparin resumed at 1800. No bleeding reported.  ? ?Heparin level returned subtherapeutic at 0.22 on IV heparin 1800 units/hr. Plan is to transition patient to Eliquis. ? ?Goal of Therapy:  ?Heparin level 0.3-0.7 units/ml ?Monitor platelets by anticoagulation protocol: Yes ?  ?Plan:  ?Increase heparin infusion to 1900 units/hr, no bolus, taking cautious approach as drug may still be accumulating, and plan to switch patient to Eliquis. ?Check anti-Xa level in 8 hours and daily while on heparin ?Continue to monitor H&H and platelets ?Daily CBC, monitor s/sx of bleeding ? ?Carma Lair, PharmD Candidate [phone] ?11/29/2021,3:35 AM ? ? ?

## 2021-11-29 NOTE — Progress Notes (Signed)
?    [facility] for Infectious Disease ? ? ?Reason for visit: Follow up on brain abscesses ? ?Interval History: bit her tongue overnight; WBC 10.2; remains afebrile ?Day 18 total antibiotics ?Day 8 ceftriaxone ?Day 2 vancomycin  ? ?Physical Exam: ?Constitutional:  ?Vitals:  ? 11/29/21 0858 11/29/21 0900  ?BP: 121/73 123/75  ?Pulse:  95  ?Resp:  19  ?Temp:    ?SpO2:  97%  ? patient appears in NAD ?Eyes: anicteric; upward gaze ?HENT: + trach; tongue cut noted, no erythema ?Respiratory: Normal respiratory effort; CTA B ? ?Review of Systems: ?Unable to be assessed due to patient factors ? ?Lab Results  ?Component Value Date  ? WBC 10.2 11/29/2021  ? HGB 9.8 (L) 11/29/2021  ? HCT 33.3 (L) 11/29/2021  ? MCV 94.3 11/29/2021  ? PLT 457 (H) 11/29/2021  ?  ?Lab Results  ?Component Value Date  ? CREATININE 0.67 11/29/2021  ? BUN 22 (H) 11/29/2021  ? NA 133 (L) 11/29/2021  ? K 4.6 11/29/2021  ? CL 97 (L) 11/29/2021  ? CO2 26 11/29/2021  ?  ?Lab Results  ?Component Value Date  ? ALT 25 11/16/2021  ? AST 19 11/16/2021  ? ALKPHOS 202 (H) 11/16/2021  ?  ? ?Microbiology: ?No results found for this or any previous visit (from the past 240 hour(s)). ? ?Impression/Plan:  ?1. Brain abscesses - multiple areas and no positive cultures.  Probable MRSA with correlation with pulmonary culture but not definitive.  On vancomycin now since she will need prolonged antibiotics for the abscesses.  Also on ceftriaxone with uncertainty of the organism and plan for 8 weeks total through April 16th or if repeat MRI shows clearance sooner.  ? ?2.  Medication monitoring - now on vancomycin as above and will need twice weekly creat and vancomycin levels  ? ?3.  OPAT - will place opat when closer to discharge ? ?I will sign off, call with questions ? ?  ?

## 2021-11-29 NOTE — Progress Notes (Signed)
Pharmacy Antibiotic Note ? ?Kristen Murphy is a 56 y.o. female admitted on 11/12/2021 with multiple brain abscesses.  Pharmacy has been consulted for vancomycin dosing. ? ?D18 total antibiotics - D3 vancomycin ?3/8 Vancomycin trough = 27 ? ?WBC stable, afebrile (Tm 101.3 on 3/1) ?3/2 TEE: mobile vegetation on MV, no PFO ?CSF culture negative ? ?Plan: ?Vancomycin 1250 mg IV every 24 hours through 01/07/22 per ID ? - VT 27;  calculated Ke = 0.0569; half-life of 12h. Estimated dosing interval = 24h. ?Obtain vancomycin level in 3-5 days  ?Monitor renal function, CBC ? ?Temp (24hrs), Avg:98.2 ?F (36.8 ?C), Min:98 ?F (36.7 ?C), Max:98.4 ?F (36.9 ?C) ? ?Recent Labs  ?Lab 11/24/21 ?0141 11/25/21 ?0041 11/26/21 ?0105 11/27/21 ?6546 11/28/21 ?0128 11/29/21 ?0157 11/29/21 ?5035  ?WBC 13.3* 10.0  --  10.7* 8.6 10.2  --   ?CREATININE 0.83 0.80 0.72 0.77 0.63 0.67  --   ?[place]  --   --   --   --   --   --  27*  ?  ?Estimated Creatinine Clearance: 72.8 mL/min (by C-G formula based on SCr of 0.67 mg/dL).   ? ?Allergies  ?Allergen Reactions  ? Glimepiride Anaphylaxis  ?  Has sulfa in it per pharmacy  ? Sulfa Antibiotics Anaphylaxis and Hives  ? Effexor [Venlafaxine] Hives  ? ? ?Antimicrobials this admission: ? ?Rocephin 2/19 >> 2/21, 3/2> [4/16] ?Vancomycin 2/19 >> 2/22, 3/6 >> [4/16] ?  3/8 VT: 27; calculated Ke 0.569, half-life 12h >> new dosing interval of 24h ?Acyclovir 2/19 >> 2/21 ?Cefepime 2/21 >> 2/27 ?Flagyl 2/21 >> 2/27 ?Linezolid 2/22 > 3/6 ?Meropenem 2/27 >> 3/2 ? ?Dose adjustments this admission: ?Vancomycin 1000 mg Q12H >> 1250 mg Q24H ? ?Microbiology results: ?2/22 CSF: neg ?2/19 BCx: neg ?2/19 Ucx pseudomonas pan sens, enterobacter cefaz -R, S -to all others ?2/20 BAL > few MRSA ?2/19 MRSA PCR > detected  ? ?Thank you for allowing pharmacy to be a part of this patient?s care. ? ?Laurey Arrow, PharmD ?[id number] Pharmacy Resident ?11/29/2021  10:55 AM ? ?Please check [email or web address] for unit-specific pharmacy phone numbers. ? ?

## 2021-11-29 NOTE — Progress Notes (Signed)
Physical Therapy Treatment ?Patient Details ?Name: Kristen Murphy ?MRN: [id number] ?DOB: 1966/05/17 ?Today's Date: 11/29/2021 ? ? ?History of Present Illness This 56 y.o. female admitted 11/12/21 with AMS, and slurred speech.  In ED she experienced acute hypoxemia with PEA arrest and emergent ET intubation and CPR.  ROSC after 2 mins. 2/19  MRI on  2/21 with numerous lesions (>50) with mild associated hemmorrhage and edema concerning for brain abcesses (not a good candidate for lesion aspiration).   2/22 LP consistent with bacterial meningitis.  Respiratory cx +MRSA; repeat CT of brain 2/26 showed Relatively well-defined focus of hypoattenuation in the superior,medial right frontal lobe and adjacent right parietal lobe,consistent with recent infarction,  2/28 LTM with ? seizures, 3/2 TEE + for mobile mass on MV with mild to moderate MR EF 60-65% - endocarditis.  Found to have Rt UE DVT and SVT of LT UE.  Trach  3/4.   PMH includes:  Anemia, anxiety, asthma, cervical CA, CKD, CAD, depression, DM, HTN, seizures, h/o stroke without residual deficits ? ?  ?PT Comments  ? ? Treated pt in conjunction with OT due to the extent of physical and cognitive deficits limiting the pt with functional mobility. Noted upon arrival, pt had bit through her tongue with part of it extending out the R side of her mouth, RN already aware. Pt off sedation for x1 hour prior to PT/OT session. Pt still not following any commands and not actively participating in session, despite max attempts to cue or stimulate pt. Pt did withdraw to noxious stimuli in all 4 extremities today, with a stronger response noted at her R leg than her L leg. Pt not responding to auditory stimuli or displaying trunk extensor tone when placed in unsupported sitting with assist from bed being placed in egress position. Updated d/c recs to SNF as insurance would not approve LTACH, per chart review. Will continue to follow acutely. ? ?  ?Recommendations for follow up  therapy are one component of a multi-disciplinary discharge planning process, led by the attending physician.  Recommendations may be updated based on patient status, additional functional criteria and insurance authorization. ? ?Follow Up Recommendations ? Skilled nursing-short term rehab (<3 hours/day) (insurance did not qualify for [facility]) ?  ?  ?Assistance Recommended at Discharge Frequent or constant Supervision/Assistance  ?Patient can return home with the following Two people to help with walking and/or transfers;Two people to help with bathing/dressing/bathroom;Assistance with cooking/housework;Assistance with feeding;Direct supervision/assist for medications management;Direct supervision/assist for financial management;Assist for transportation;Help with stairs or ramp for entrance ?  ?Equipment Recommendations ? Other (comment) (defer to next venue of care)  ?  ?Recommendations for Other Services   ? ? ?  ?Precautions / Restrictions Precautions ?Precautions: Fall;Other (comment) ?Precaution Comments: Trach on vent; cortrak; bit through tongue ?Restrictions ?Weight Bearing Restrictions: No  ?  ? ?Mobility ? Bed Mobility ?Overal bed mobility: Needs Assistance ?Bed Mobility: Rolling, Supine to Sit ?Rolling: Total assist, +2 for physical assistance, +2 for safety/equipment ?  ?Supine to sit: Total assist, +2 for physical assistance, +2 for safety/equipment, HOB elevated ?  ?  ?General bed mobility comments: Pt unable to assist with rolling Lt and Rt for linen change. Pt dependently pulled up to unsupported sit from elevated HOB with bed in egress position, no trunk extension tone noted or attempts to actively assist by pt. ?  ? ?Transfers ?  ?  ?  ?  ?  ?  ?  ?  ?  ?General transfer  comment: unable ?  ? ?Ambulation/Gait ?  ?  ?  ?  ?  ?  ?  ?General Gait Details: unable ? ? ?Stairs ?  ?  ?  ?  ?  ? ? ?Wheelchair Mobility ?  ? ?Modified Rankin (Stroke Patients Only) ?  ? ? ?  ?Balance Overall balance assessment:  Needs assistance ?  ?Sitting balance-Leahy Scale: Zero ?Sitting balance - Comments: Bed placed in egress and pt dependently pulled anteriorly into unsupported sitting with total A +2.  No balance reactions noted, extension tone, or activation of core musculature this date. ?  ?  ?  ?  ?  ?  ?  ?  ?  ?  ?  ?  ?  ?  ?  ?  ? ?  ?Cognition Arousal/Alertness:  (eyes open) ?Behavior During Therapy: Flat affect ?Overall Cognitive Status: Impaired/Different from baseline ?  ?  ?  ?  ?  ?  ?  ?  ?  ?  ?  ?  ?  ?  ?  ?  ?General Comments: Pt minimally responsive.  Eyes rolled Rt and upwards and intermittently roaming but not following commands to look at therapist or purposeful attempts to make eye contact or track. No reaction to auditory stimuli bil today. No attempts to actively follow commands. Withdraws from noxious/painful stimuli at all four extremities, R leg with increased power of response than L. ?  ?  ? ?  ?Exercises   ? ?  ?General Comments General comments (skin integrity, edema, etc.): VSS on trach on vent; pt had already bit through part of tongue with part of tongue extending out R side of mouth upon arrival, RN aware already ?  ?  ? ?Pertinent Vitals/Pain Pain Assessment ?Pain Assessment: Faces ?Faces Pain Scale: No hurt ?Pain Intervention(s): Monitored during session  ? ? ?Home Living   ?  ?  ?  ?  ?  ?  ?  ?  ?  ?   ?  ?Prior Function    ?  ?  ?   ? ?PT Goals (current goals can now be found in the care plan section) Acute Rehab PT Goals ?Patient Stated Goal: did not state ?PT Goal Formulation: Patient unable to participate in goal setting ?Time For Goal Achievement: 12/10/21 ?Potential to Achieve Goals: Fair ?Progress towards PT goals: Not progressing toward goals - comment ? ?  ?Frequency ? ? ? Min 2X/week ? ? ? ?  ?PT Plan Discharge plan needs to be updated  ? ? ?Co-evaluation PT/OT/SLP Co-Evaluation/Treatment: Yes ?Reason for Co-Treatment: Complexity of the patient's impairments (multi-system  involvement);Necessary to address cognition/behavior during functional activity;For patient/therapist safety ?PT goals addressed during session: Strengthening/ROM;Mobility/safety with mobility;Balance ?  ?  ? ?  ?AM-PAC PT "6 Clicks" Mobility   ?Outcome Measure ? Help needed turning from your back to your side while in a flat bed without using bedrails?: Total ?Help needed moving from lying on your back to sitting on the side of a flat bed without using bedrails?: Total ?Help needed moving to and from a bed to a chair (including a wheelchair)?: Total ?Help needed standing up from a chair using your arms (e.g., wheelchair or bedside chair)?: Total ?Help needed to walk in hospital room?: Total ?Help needed climbing 3-5 steps with a railing? : Total ?6 Click Score: 6 ? ?  ?End of Session Equipment Utilized During Treatment: Oxygen ?Activity Tolerance: Other (comment) (limited by cognition) ?Patient left: in bed;with  call bell/phone within reach;with bed alarm set ?Nurse Communication: Mobility status ?PT Visit Diagnosis: Muscle weakness (generalized) (M62.81);Difficulty in walking, not elsewhere classified (R26.2);Other symptoms and signs involving the nervous system (R29.898) ?  ? ? ?Time: 1010-1038 ?PT Time Calculation (min) (ACUTE ONLY): 28 min ? ?Charges:  $Therapeutic Activity: 8-22 mins          ?          ? ?Moishe Spice, PT, DPT ?Acute Rehabilitation Services  ?Pager: [phone] ?Office: [phone] ? ? ? ?Maretta Bees Pettis ?11/29/2021, 12:16 PM ? ?

## 2021-11-29 NOTE — Progress Notes (Addendum)
Occupational Therapy Treatment Patient Details Name: Kristen Murphy MRN: [id number] DOB: 08-Jul-1966 Today's Date: 11/29/2021   History of present illness This 56 y.o. female admitted 11/12/21 with AMS, and slurred speech.  In ED she experienced acute hypoxemia with PEA arrest and emergent ET intubation and CPR.  ROSC after 2 mins. 2/19  MRI on  2/21 with numerous lesions (>50) with mild associated hemmorrhage and edema concerning for brain abcesses (not a good candidate for lesion aspiration).   2/22 LP consistent with bacterial meningitis.  Respiratory cx +MRSA; repeat CT of brain 2/26 showed Relatively well-defined focus of hypoattenuation in the superior,medial right frontal lobe and adjacent right parietal lobe,consistent with recent infarction,  2/28 LTM with ? seizures, 3/2 TEE + for mobile mass on MV with mild to moderate MR EF 60-65% - endocarditis.  Found to have Rt UE DVT and SVT of LT UE.  Trach  3/4.   PEG tube 3/7 PMH includes:  Anemia, anxiety, asthma, cervical CA, CKD, CAD, depression, DM, HTN, seizures, h/o stroke without residual deficits   OT comments  Pt similar to previous OT session as far as function and response to multimodal stimulus. Seen in conjunction with PT for safety and due to extent of cognitive deficits and medical support required. Pt off sedation for x1 hour prior to PT/OT session. Pt still not following any commands and not actively participating in session, despite max attempts to cue or stimulate pt. Pt with rythmic roving eye movements.  She does not visually fixate or track.  No blink to threat noted. Pt did withdraw to noxious stimuli in all 4 extremities today. No babinski reflex or response to loud auditory stimulus or "classic rock music" Pt continues to be total A for all aspects of ADL. OT will continue to follow acutely, and dc recommendations updated to SNF as insurance would not approve LTACH, per chart review.  Of note:  upon arrival, pt had bit through her  tongue with part of it extending out the R side of her mouth, RN already aware   Recommendations for follow up therapy are one component of a multi-disciplinary discharge planning process, led by the attending physician.  Recommendations may be updated based on patient status, additional functional criteria and insurance authorization.    Follow Up Recommendations  Skilled nursing-short term rehab (<3 hours/day)    Assistance Recommended at Discharge Frequent or constant Supervision/Assistance  Patient can return home with the following  Two people to help with walking and/or transfers;Two people to help with bathing/dressing/bathroom;Direct supervision/assist for medications management;Direct supervision/assist for financial management;Assist for transportation;Help with stairs or ramp for entrance   Equipment Recommendations  None recommended by OT    Recommendations for Other Services      Precautions / Restrictions Precautions Precautions: Fall;Other (comment) Precaution Comments: Trach on vent; cortrak; bit through tongue Restrictions Weight Bearing Restrictions: No       Mobility Bed Mobility Overal bed mobility: Needs Assistance Bed Mobility: Rolling, Supine to Sit Rolling: Total assist, +2 for physical assistance, +2 for safety/equipment   Supine to sit: Total assist, +2 for physical assistance, +2 for safety/equipment, HOB elevated     General bed mobility comments: Pt unable to assist with rolling Lt and Rt for linen change. Pt dependently pulled up to unsupported sit from elevated HOB with bed in egress position, no trunk extension tone noted or attempts to actively assist by pt.    Transfers  General transfer comment: unable     Balance Overall balance assessment: Needs assistance   Sitting balance-Leahy Scale: Zero Sitting balance - Comments: Bed placed in egress and pt dependently pulled anteriorly into unsupported sitting with total  A +2.  No balance reactions noted, extension tone, or activation of core musculature this date.                                   ADL either performed or assessed with clinical judgement   ADL Overall ADL's : Needs assistance/impaired                                       General ADL Comments: Pt requires total A for all aspects    Extremity/Trunk Assessment Upper Extremity Assessment Upper Extremity Assessment: RUE deficits/detail;LUE deficits/detail RUE Deficits / Details: internal rotation to pain stimulus on nail bed.  PROM WFL - generally flaccid throughout session. RUE Coordination: decreased gross motor;decreased fine motor LUE Deficits / Details: internal rotation to pain stimulus on nail bed.  PROM WFL - generally flaccid throughout session. LUE Coordination: decreased gross motor;decreased fine motor   Lower Extremity Assessment Lower Extremity Assessment: Defer to PT evaluation        Vision   Additional Comments: Pt with rythmic roving eye movements.  She does not visually fixate or track.  No blink to threat noted   Perception     Praxis      Cognition Arousal/Alertness:  (eyes open) Behavior During Therapy: Flat affect Overall Cognitive Status: Impaired/Different from baseline                                 General Comments: Pt minimally responsive.  Eyes rolled Rt and upwards and intermittently roaming but not following commands to look at therapist or purposeful attempts to make eye contact or track. No reaction to auditory stimuli bil today. No attempts to actively follow commands. Withdraws from noxious/painful stimuli at all four extremities, R leg with increased power of response than L.        Exercises Exercises: Other exercises Other Exercises Other Exercises: PROM to bil UEs and lower extremities along with cervical rotation    Shoulder Instructions       General Comments VSS on trach on vent; pt  had already bit through part of tongue with part of tongue extending out R side of mouth upon arrival, RN aware already    Pertinent Vitals/ Pain       Pain Assessment Pain Assessment: Faces Faces Pain Scale: No hurt Pain Intervention(s): Monitored during session, Repositioned  Home Living                                          Prior Functioning/Environment              Frequency  Min 2X/week        Progress Toward Goals  OT Goals(current goals can now be found in the care plan section)  Progress towards OT goals: Not progressing toward goals - comment (no progress this session)  Acute Rehab OT Goals Patient Stated Goal: none stated, no family present at this date  OT Goal Formulation: Patient unable to participate in goal setting Time For Goal Achievement: 12/10/21 Potential to Achieve Goals: [place] Discharge plan remains appropriate    Co-evaluation    PT/OT/SLP Co-Evaluation/Treatment: Yes Reason for Co-Treatment: Complexity of the patient's impairments (multi-system involvement);Necessary to address cognition/behavior during functional activity;For patient/therapist safety;To address functional/ADL transfers PT goals addressed during session: Strengthening/ROM OT goals addressed during session: Strengthening/ROM      AM-PAC OT "6 Clicks" Daily Activity     Outcome Measure   Help from another person eating meals?: Total Help from another person taking care of personal grooming?: Total Help from another person toileting, which includes using toliet, bedpan, or urinal?: Total Help from another person bathing (including washing, rinsing, drying)?: Total Help from another person to put on and taking off regular upper body clothing?: Total Help from another person to put on and taking off regular lower body clothing?: Total 6 Click Score: 6    End of Session Equipment Utilized During Treatment: Oxygen  OT Visit Diagnosis: Cognitive  communication deficit (R41.841);Muscle weakness (generalized) (M62.81) Symptoms and signs involving cognitive functions: Cerebral infarction   Activity Tolerance Other (comment) (impaired cognition)   Patient Left in bed;with call bell/phone within reach;with nursing/sitter in room   Nurse Communication Mobility status        Time: 0962-8366 OT Time Calculation (min): 28 min  Charges: OT General Charges $OT Visit: 1 Visit OT Treatments $Therapeutic Activity: 8-22 mins  Jesse Sans OTR/L Acute Rehabilitation Services Pager: [phone] Office: [place] 11/29/2021, 1:50 PM

## 2021-11-29 NOTE — Progress Notes (Signed)
Patient seen today by trach team for consult.  No education is needed at this time.  All the necessary equipment is at the beside.   Will continue to follow for progression. The patient is still on full ventilatory support.  

## 2021-11-29 NOTE — Progress Notes (Signed)
NAME:  Kristen Murphy, MRN:  [id number], DOB:  Jun 02, 1966, LOS: 60 ADMISSION DATE:  11/12/2021, CONSULTATION DATE: 11/12/2021 REFERRING MD: Dr. Langston Masker, CHIEF COMPLAINT: PEA arrest  History of Present Illness:  56 year old woman with a history of tobacco use, cervical cancer ([facility]), post nephrectomy and colostomy, CAD with LAD stent 01/2019, diabetes, depression, hypertension, remote CVA without known deficits.  Also with a history of substance abuse.  Brought in by her son to the ED 2/19 with fever, altered mental status and slurred speech.  Apparently she had a flulike illness for a week, was found confused on 2/19.  Some suspicion by son that she may have been taking more prescription narcotics, also taking many NSAIDs per his report.  She was disoriented in the ED, febrile 103F, hyperglycemic. In the ED she apparently experienced acute hypoxemia, had some blood from her mouth, question emesis versus hemoptysis.  Devolved to PEA and required emergent ET intubation and CPR.  ROSC after 2 minutes.  OG tube placed with bright red blood obtained.  Chest x-ray with mild right hilar prominence, more notable on postintubation film.  No infiltrates or effusions She has received empiric antibiotics, is receiving 1 unit PRBC, is about to start Protonix infusion. Hemodynamically stabilized, mechanically ventilated.  She remains critically ill.  Pertinent  Medical History   Past Medical History:  Diagnosis Date   Anemia    Anxiety    Arthritis    Asthma    Cervical cancer ([place])    Chronic kidney disease    Coronary artery disease    Depression    Diabetes mellitus without complication (HCC)    Dyspnea    Headache    Heart murmur    Hx of adenomatous polyp of colon    Hypertension    Rectovaginal fistula    Seizures ([place])    Stercoral ulcer of rectum 12/2020   Stroke [facility])     Significant Hospital Events: Including procedures, antibiotic start and stop dates in addition to other pertinent  events   2/19 Intubated. Head CT  > no acute CT findings, old left basal ganglia lacunar, old left frontal cortical and subcortical infarct MRI brain 2/21 numerous lesions with mild associated hemorrhage and edema. Neurosurgery reviewed imaging and patient not a good candidate for lesion aspiration. 2/24 MR Brain - Numerous lesions with mild associated hemorrhage. Mild edema. Concerned for brain abscesses 2/22 LP consistent with bacterial meningitis. Respiratory Cx +MRSA 2/25 MR Liver with no lesions, hypoperfusion to the subcapsular right hepatic lobe, favor benign vascular phenomena, distended gallbladder, persistent region of hypoperfusion. EEG and LTM 2/25 neg for seizures.  Repeat head CT 2/27 with no acute changes  2/28 No acute events overnight, LTM read ? Seizure.  Cefepime switched to meropenem. 3/2 LTM stopped. Meropenem changed to CTX. TEE + mobile mass on MV w/ mild to mod MR EF 60-65% 3/3 change to IV heparin for possible trach  3/4 trach placed 3/5 ? Seizure activity, LTM restarted, weaned 8 hrs  3/6 remains on LTM- no seizure to correlate with episodes of HR 130s, jaw clenching, urinary incontinence.  Linezolid changed to vanc per ID for longterm therapy, weaned x 8 hours, diuresing 3/7 hypoglycemic overnight w/ held TF, PEG placed per IR, off propofol, LTM d/c'd, weaned x 8 hrs 8/8, 50%  Interim History / Subjective:  No events overnight Afebrile  Objective   Blood pressure 122/77, pulse 93, temperature 98 F (36.7 C), temperature source Axillary, resp. rate 20, height 5'  2" (1.575 m), weight 71.6 kg, SpO2 100 %.    Vent Mode: PSV;CPAP FiO2 (%):  [40 %-50 %] 40 % Set Rate:  [20 bmp] 20 bmp Vt Set:  [400 mL] 400 mL PEEP:  [8 cmH20] 8 cmH20 Pressure Support:  [8 cmH20-10 cmH20] 10 cmH20 Plateau Pressure:  [16 cmH20-17 cmH20] 17 cmH20   Intake/Output Summary (Last 24 hours) at 11/29/2021 0839 Last data filed at 11/29/2021 0600 Gross per 24 hour  Intake 1154.18 ml  Output  400 ml  Net 754.18 ml   Filed Weights   11/27/21 0537 11/28/21 0500 11/29/21 0426  Weight: 77.8 kg 74.3 kg 71.6 kg   Physical Exam: fent 25 mcg/hr General:  older female lying in bed in NAD HEENT: MM pink/moist, ongoing drooling, still unable to easy open mouth to visualize full tongue but part of tongue is visible on the right, split appearing protruding from missing teeth on right.  No visible necrosis or active bleeding.  Oral secretions now slightly pink tinged, previously clear.  Seems to chew/ grind teeth with stimulation. Poor dentition.  Pupils 4/reactive, left more sluggish, cortrak left nare, midline sutured cuffed trach site wnl Neuro:  opens eyes with stimulation, no blink to threat, upward gaze intermittently, no spont movement in extremities, extensor posturing in UE, flicker/ triple reflex in LE CV:  rr, NSR, up to ST with stimulation PULM:  non labored on PSV, clear today, minimal secretions GI: soft, bs +, ND, ostmy LUQ/ wnl, new PEG- site wnl, close proximity to ostomy dressing Extremities: warm/dry, UE +1 edema, trace LE, muscle wasting Skin: no rashes   Afebrile UOP 400 ml/ 24hrs ? 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 Acute hypoxemic respiratory failure due to necrosing MRSA pneumonia Multifactorial encephalopathy in setting of MRSA CNS infection, sedation, post arrest,and sepsis.  LTVEEG d/c'd 3/1.  No active seizures, active focus in left frontal region.  LTM d/c  again 3/7- no seizure activity Acute on chronic anemia, UGIB- s/p EGD on 2/19 showing erosive gastropathy Hx tobacco abuse PICC-associated DVT BLE- on AC, PICC removed Hx cervical Ca post nephrectomy and diverting colostomy S/P in ER PEA arrest 11/12/21 Hx CAD s/p LAD stent 2020 DM w/ hyperglycemia Depression CKD Seizure hx HTN Remote CVA Distant hx substance abuse Tongue laceration  - Prolonged abx per ID- remains on CTX/ vanc (changed from linezolid 3/6 per ID given prolonged coarse, 8 weeks to stop around 01/07/22;  have asked ID to give recs if if we need to add anaerobic coverage for tongue laceration - at risk for further tongue biting/ detachment, monitor closely.  If worsening clenching of teeth, will need to reinsert bite block and consider ENT consult.  Trying to limit sedation for neuro prognostication.   Monitor for bleeding -  PICC cannot place due to thrombi in both upper extremities, continue PIVs - heparin per pharmacy, will transition to Eliquis this PM, to be treated x 3 months - ok to use PEG after 24hrs which will be later this afternoon.  Will restart TF, d/c  cortrak ( being used for meds currently) - continue full MV support with daily SBT, goal is to get to ATC.   - ongoing trach care - cont daily enteral lasix BID, monitor I/Os, weights closely  - monitor electrolytes closely, replete as needed - repeat Na given drop 138> 133 to verify - LTM stopped 3/7> no seizure activity.  Continue seizure precautions.  No improvement thus far neurologically.  Trying to limit sedation.  Will start oxy IR '5mg'$  q 6hr, stop fentanyl drip and switch to prn.  Versed prn seizure or severe tongue biting. Family still wants give another 3 weeks to see if any improvement and if none, will consider possible transition to comfort care - continue AEDs per neurology- keppra/ vimpat, gabapentin  - continue SSI, will need to likely increase semglee back on 3/9 after TF restarted  - appreciate  TOC assistance to assess for long term placement, likely SNF  Best Practice (right click and "Reselect all SmartList Selections" daily)   Diet/type: tubefeeds- on hold till 5pm today, then restart if ok with IR DVT prophylaxis: heparin gtt per pharmacy-> transition to eliquis this PM (24 hours after PEG placement) GI prophylaxis: PPI  Lines: N/A - PIV only, x2 Foley:  N/A- purwick Code Status:  full code Last date of multidisciplinary goals of care discussion: 3/6.  Family wants another 3 weeks to assess for any neuro improvements.  If no progression, will consider transition to comfort care.    CCT: 30 mins   Kennieth Rad, ACNP [facility] 11/29/2021, 8:39 AM  See Amion for pager If no response to pager, please call PCCM consult pager After 7:00 pm call [place]

## 2021-11-29 NOTE — TOC Progression Note (Addendum)
Transition of Care (TOC) - Progression Note  ? ? ?Patient Details  ?Name: Kristen Murphy ?MRN: [id number] ?Date of Birth: 1966-01-07 ? ?Transition of Care (TOC) CM/SW Contact  ?Milas Gain, LCSWA ?Phone Number: ?11/29/2021, 2:13 PM ? ?Clinical Narrative:    ? ?CSW spoke with Santiago Glad at [facility] in [place] for possible Vent SNF placement for patient. CSW informed Santiago Glad that patient is now at 40% on vent. CSW informed Santiago Glad that patient has peg tube and that CSW following to see when cortrak will be removed. Santiago Glad informed CSW that she will follow back with CSW to confirm is they can offer a vent SNF bed for patient.CSW updated patients daughter [name]. ? ?Update- Santiago Glad with [facility] Vent/SNF called CSW and requested that patient be on volume assist control mode. CSW called Lanelle Bal with respiratory and informed respiratory of vent/snf request. Pending bed offer with [facility]. Patient passr is currently pending.CSW will continue to follow. ? ?Update- CSW received call from [place] with respiratory .Lanelle Bal reports patient is on volume assist control mode. CSW informed Santiago Glad with [facility] Vent/Snf. ? ?Expected Discharge Plan:  (trach snf) ?Barriers to Discharge: Continued Medical Work up ? ?Expected Discharge Plan and Services ?Expected Discharge Plan:  (trach snf) ?In-house Referral: Clinical Social Work ?Discharge Planning Services: CM Consult ?Post Acute Care Choice: [place] ?Living arrangements for the past 2 months: [place] ?                ?  ?  ?  ?  ?  ?  ?  ?  ?  ?  ? ? ?Social Determinants of Health (SDOH) Interventions ?  ? ?Readmission Risk Interventions ?Readmission Risk Prevention Plan 11/23/2021  ?Transportation Screening Complete  ?Medication Review Press photographer) Complete  ?SW Recovery Care/Counseling Consult Complete  ?Palliative Care Screening Complete  ?Some recent data might be hidden  ? ? ?

## 2021-11-29 NOTE — Progress Notes (Signed)
SLP Note ? ?Patient Details ?Name: Kristen Murphy ?MRN: [id number] ?DOB: Mar 25, 1966 ? ? ?Cancelled treatment:       Reason Eval/Treat Not Completed: Patient not medically ready for PMV/swallow evals.  Our service will sign off. Please re-consult when warranted. ?Thank you. ? ?Myndi Wamble L. Laurin Morgenstern, MA CCC/SLP ?Acute Rehabilitation Services ?Office number [phone] ?Pager [phone] ? ? ? ?Kristen Murphy ?11/29/2021, 10:26 AM ?

## 2021-11-29 NOTE — Progress Notes (Addendum)
PHARMACY CONSULT NOTE FOR: ? ?OUTPATIENT  PARENTERAL ANTIBIOTIC THERAPY (OPAT) ? ?Indication: Brain abscesses ?Regimen: Vancomycin 1250 mg IV every 24 hours + Ceftriaxone 2 gm IV Q 12 hours  ?End date: 01/07/2022 ? ?IV antibiotic discharge orders are pended. ?To discharging provider:  please sign these orders via discharge navigator,  ?Select New Orders & click on the button choice - Manage This Unsigned Work.  ?  ? ?Thank you for allowing pharmacy to be a part of this patient's care. ? ?Jimmy Footman, PharmD, BCPS, BCIDP ?Infectious Diseases Clinical Pharmacist ?Phone: [phone] ?11/29/2021  11:05 AM ? ?Please check [email or web address] for unit-specific pharmacy phone numbers. ? ? ?

## 2021-11-29 NOTE — Progress Notes (Signed)
ANTICOAGULATION CONSULT NOTE - Follow Up Consult ? ?Pharmacy Consult for heparin ?Indication:  upper extremity thrombus ? ?Allergies  ?Allergen Reactions  ? Glimepiride Anaphylaxis  ?  Has sulfa in it per pharmacy  ? Sulfa Antibiotics Anaphylaxis and Hives  ? Effexor [Venlafaxine] Hives  ? ? ?Patient Measurements: ?Height: '5\' 2"'$  (157.5 cm) ?Weight: 71.6 kg (157 lb 13.6 oz) ?IBW/kg (Calculated) : 50.1 ?Heparin Dosing Weight: 67 kg ? ?Vital Signs: ?Temp: 98 ?F (36.7 ?C) (03/08 7829) ?Temp Source: Axillary (03/08 0743) ?BP: 123/75 (03/08 0900) ?Pulse Rate: 95 (03/08 0900) ? ?Labs: ?Recent Labs  ?  11/27/21 ?5621 11/27/21 ?1743 11/28/21 ?0128 11/29/21 ?0157 11/29/21 ?3086  ?HGB 9.6*  --  9.9* 9.8*  --   ?HCT 31.2*  --  32.7* 33.3*  --   ?PLT 365  --  388 457*  --   ?LABPROT 13.9  --   --   --   --   ?INR 1.1  --   --   --   --   ?HEPARINUNFRC 0.17*   < > 0.13* 0.22* 0.64  ?CREATININE 0.77  --  0.63 0.67  --   ? < > = values in this interval not displayed.  ? ? ? ?Estimated Creatinine Clearance: 72.8 mL/min (by C-G formula based on SCr of 0.67 mg/dL). ? ?Assessment: ?23yof with MRSA bacteremia, endocarditis, and brain abscess. S/p VDRF and new UE thrombus > PICC removed and treated with enoxaparin '1mg'$ /kg q12h. Planning trach, so changed to heparin drip for easier on/off anticoagulation insetting of procedures. ? ?Patient is s/p PEG placement 3/7. Heparin level this morning is therapeutic at 0.64, on 1900 units/hr. CBC stable. Noticed tongue split this morning - had bleeding this morning which has improved - will continue to monitor.  ? ?Goal of Therapy:  ?Heparin level 0.3-0.7 units/ml ?Monitor platelets by anticoagulation protocol: Yes ?  ?Plan:  ?Continue heparin infusion at 1900 units/hr  ?Plan to transition to apixaban this evening (24 hr after PEG placement) - given VTE and has been subtherapeutic, will transition to apixaban 10 mg BID for 7 days then 5 mg BID ?Daily CBC, monitor s/sx of bleeding ? ?Antonietta Jewel,  PharmD, BCCCP ?Clinical Pharmacist  ?Phone: [phone] ?11/29/2021 10:18 AM ? ?Please check AMION for all [place] phone numbers ?After 10:00 PM, call [place] [phone] ? ? ? ?

## 2021-11-30 DIAGNOSIS — E119 Type 2 diabetes mellitus without complications: Secondary | ICD-10-CM | POA: Diagnosis not present

## 2021-11-30 DIAGNOSIS — Z452 Encounter for adjustment and management of vascular access device: Secondary | ICD-10-CM | POA: Diagnosis not present

## 2021-11-30 DIAGNOSIS — G060 Intracranial abscess and granuloma: Secondary | ICD-10-CM | POA: Diagnosis not present

## 2021-11-30 DIAGNOSIS — I469 Cardiac arrest, cause unspecified: Secondary | ICD-10-CM | POA: Diagnosis not present

## 2021-11-30 LAB — CBC
HCT: 33 % — ABNORMAL LOW (ref 36.0–46.0)
Hemoglobin: 10 g/dL — ABNORMAL LOW (ref 12.0–15.0)
MCH: 28 pg (ref 26.0–34.0)
MCHC: 30.3 g/dL (ref 30.0–36.0)
MCV: 92.4 fL (ref 80.0–100.0)
Platelets: 500 10*3/uL — ABNORMAL HIGH (ref 150–400)
RBC: 3.57 MIL/uL — ABNORMAL LOW (ref 3.87–5.11)
RDW: 19.3 % — ABNORMAL HIGH (ref 11.5–15.5)
WBC: 8.8 10*3/uL (ref 4.0–10.5)
nRBC: 0 % (ref 0.0–0.2)

## 2021-11-30 LAB — BASIC METABOLIC PANEL
Anion gap: 10 (ref 5–15)
BUN: 22 mg/dL — ABNORMAL HIGH (ref 6–20)
CO2: 28 mmol/L (ref 22–32)
Calcium: 8.9 mg/dL (ref 8.9–10.3)
Chloride: 99 mmol/L (ref 98–111)
Creatinine, Ser: 0.72 mg/dL (ref 0.44–1.00)
GFR, Estimated: >60 mL/min (ref >60)
Glucose, Bld: 125 mg/dL — ABNORMAL HIGH (ref 70–99)
Potassium: 3.5 mmol/L (ref 3.5–5.1)
Sodium: 137 mmol/L (ref 135–145)

## 2021-11-30 LAB — GLUCOSE, CAPILLARY
Glucose-Capillary: 109 mg/dL — ABNORMAL HIGH (ref 70–99)
Glucose-Capillary: 109 mg/dL — ABNORMAL HIGH (ref 70–99)
Glucose-Capillary: 115 mg/dL — ABNORMAL HIGH (ref 70–99)
Glucose-Capillary: 126 mg/dL — ABNORMAL HIGH (ref 70–99)
Glucose-Capillary: 150 mg/dL — ABNORMAL HIGH (ref 70–99)
Glucose-Capillary: 154 mg/dL — ABNORMAL HIGH (ref 70–99)
Glucose-Capillary: 161 mg/dL — ABNORMAL HIGH (ref 70–99)
Glucose-Capillary: 167 mg/dL — ABNORMAL HIGH (ref 70–99)
Glucose-Capillary: 57 mg/dL — ABNORMAL LOW (ref 70–99)

## 2021-11-30 MED ORDER — DEXTROSE 50 % IV SOLN
INTRAVENOUS | Status: AC
  Filled 2021-11-30: qty 50

## 2021-11-30 MED ORDER — DEXTROSE 50 % IV SOLN
12.5000 g | INTRAVENOUS | Status: AC
  Administered 2021-11-30: 04:00:00 12.5 g via INTRAVENOUS

## 2021-11-30 MED ORDER — INSULIN GLARGINE-YFGN 100 UNIT/ML ~~LOC~~ SOLN
9.0000 [IU] | Freq: Two times a day (BID) | SUBCUTANEOUS | Status: DC
Start: 2021-11-30 — End: 2021-12-05
  Administered 2021-11-30 – 2021-12-04 (×10): 9 [IU] via SUBCUTANEOUS
  Filled 2021-11-30 (×12): qty 0.09

## 2021-11-30 MED ORDER — POTASSIUM CHLORIDE CRYS ER 20 MEQ PO TBCR
40.0000 meq | EXTENDED_RELEASE_TABLET | Freq: Once | ORAL | Status: AC
  Administered 2021-11-30: 17:00:00 40 meq via ORAL
  Filled 2021-11-30: qty 2

## 2021-11-30 MED ORDER — CHLORHEXIDINE GLUCONATE 0.12 % MT SOLN
OROMUCOSAL | Status: AC
  Administered 2021-11-30: 08:00:00 15 mL via OROMUCOSAL
  Filled 2021-11-30: qty 15

## 2021-11-30 NOTE — Progress Notes (Signed)
[facility]                                                                              Patient Demographics  Kristen Murphy, is a 56 y.o. female, DOB - [birth year], WIO:[id number]  Admit date - 11/12/2021   Admitting Physician Collene Gobble, MD  Outpatient Primary MD for the patient is Gweneth Fritter, FNP  Outpatient specialists:   LOS - 18  days   Medical records reviewed and are as summarized below:    Chief Complaint  Patient presents with   Altered Mental Status       Brief summary   Per admission history by PCCM on 2/19 Patient is a 56 year old female with history of tobacco use, cervical CA, post nephrectomy and colostomy, CAD with LAD stenting 01/2019, diabetes, depression, hypertension, remote CVA, substance use, was brought in by her son to the ED on 2/19 with fever, altered mental status and slurred speech. Apparently, she had a flulike illness for a week, was found confused on 2/19.  There was some suspicion by son that she may have been taking more prescription narcotics, many NSAIDs.  She was disoriented in the ED, febrile 103 F, hyperglycemic.  In ED, she also experienced acute hypoxemia, had some blood from her mouth, emesis versus hemoptysis.  Reported it to PEA and required emergent ED intubation and CPR.  ROSC after 2 minutes, OG tube placed with bright red blood obtained.  Chest x-ray with mild right hilar prominence, more notable on postintubation films, no infiltrates or effusions. Received empiric antibiotics, packed RBC transfusion and admitted to ICU. 2/19 Intubated. Head CT  > no acute CT findings, old left basal ganglia lacunar, old left frontal cortical and subcortical infarct MRI brain 2/21 numerous lesions with mild associated hemorrhage and edema. Neurosurgery reviewed imaging and patient not a good candidate for lesion aspiration. 2/24 MR Brain - Numerous lesions with mild associated hemorrhage. Mild edema. Concerned for brain  abscesses 2/22 LP consistent with bacterial meningitis. Respiratory Cx +MRSA 2/25 MR Liver with no lesions, hypoperfusion to the subcapsular right hepatic lobe, favor benign vascular phenomena, distended gallbladder, persistent region of hypoperfusion. EEG and LTM 2/25 neg for seizures.  Repeat head CT 2/27 with no acute changes  2/28 No acute events overnight, LTM read ? Seizure.  Cefepime switched to meropenem. 3/2 LTM stopped. Meropenem changed to CTX. TEE + mobile mass on MV w/ mild to mod MR EF 60-65% 3/3 change to IV heparin for possible trach  3/4 trach placed 3/5 ? Seizure activity, LTM restarted, weaned 8 hrs  3/6 remains on LTM- no seizure to correlate with episodes of HR 130s, jaw clenching, urinary incontinence.  Linezolid changed to vanc per ID for longterm therapy, weaned x 8 hours, diuresing 3/7 hypoglycemic overnight w/ held TF, PEG placed per IR, off propofol, LTM d/c'd, weaned x 8 hrs 8/8, 50% 3/9: Patient transferred to [facility], assumed care     Assessment & Plan    Principal Problem: Sepsis with brain abscess, MRSA bacteremia, MV endocarditis, pulmonary spread -Prolonged antibiotics per ID.  Remains on ceftriaxone/vancomycin (changed from linezolid 3/6 per ID), plan  for 8 weeks through April 16 or if repeat MRI shows clearance sooner -PICC cannot be placed due to thrombi in both upper extremities, continue peripheral IVs   Active Problems: Acute hypoxemic respiratory failure -Due to necrotizing MRSA pneumonia -CCM will continue to follow for vent support, ongoing trach care -Requested RN to place bite block for the tongue laceration  Acute multifactorial encephalopathy -Secondary to #1, sedation, postarrest and sepsis -Underwent prolonged EEG, stopped 3/7 due to no seizure activity -Continue Keppra, Vimpat, gabapentin  PICC associated upper extremity DVT bilaterally -Continue peripheral IVs, was on IV heparin, transitioned to Eliquis x 3 months  Nutrition -PEG  tube placed on 3/7, discontinue core track  -Continue tube feeds and medications via PEG tube  Diabetes mellitus, type II -Continue sliding scale insulin, Semglee 9 units twice daily, NovoLog 12 units every 4 hours  Code Status: Full code.   DVT Prophylaxis:  SCDs Start: 11/12/21 1839 apixaban (ELIQUIS) tablet 10 mg  apixaban (ELIQUIS) tablet 5 mg   Level of Care: Level of care: ICU Family Communication:  Disposition Plan:     Status is: Inpatient Family wants another 3 weeks to assess for any new improvements if no progression, will consider transition to comfort care.  Time Spent in minutes   35 minutes  Procedures:  Mechanical ventilation PEG tube placement  Consultants:   Neurology Critical care Interventional radiology  Antimicrobials:  IV vancomycin IV ceftriaxone   Medications  Scheduled Meds:  amLODipine  10 mg Per Tube Daily   apixaban  10 mg Per Tube BID   Followed by   Derrill Memo ON 12/06/2021] apixaban  5 mg Per Tube BID   artificial tears   Both Eyes Q8H   chlorhexidine gluconate (MEDLINE KIT)  15 mL Mouth Rinse BID   Chlorhexidine Gluconate Cloth  6 each Topical Daily   docusate  100 mg Per Tube BID   furosemide  40 mg Per Tube BID   gabapentin  300 mg Per Tube Q12H   insulin aspart  0-9 Units Subcutaneous Q4H   insulin aspart  12 Units Subcutaneous Q4H   insulin glargine-yfgn  9 Units Subcutaneous BID   mouth rinse  15 mL Mouth Rinse 10 times per day   metoprolol tartrate  25 mg Per Tube BID   oxyCODONE  5 mg Per Tube Q6H   pantoprazole sodium  40 mg Per Tube Daily   polyethylene glycol  17 g Per Tube Daily   Continuous Infusions:  sodium chloride Stopped (11/21/21 1033)   sodium chloride 20 mL/hr at 11/23/21 1620   sodium chloride Stopped (11/26/21 1648)   cefTRIAXone (ROCEPHIN)  IV 200 mL/hr at 11/30/21 1200   feeding supplement (VITAL AF 1.2 CAL) 55 mL/hr at 11/30/21 0941   lacosamide (VIMPAT) IV Stopped (11/30/21 1113)   levETIRAcetam Stopped  (11/30/21 8850)   vancomycin Stopped (11/29/21 2338)   PRN Meds:.sodium chloride, acetaminophen **OR** acetaminophen (TYLENOL) oral liquid 160 mg/5 mL **OR** acetaminophen, albuterol, fentaNYL (SUBLIMAZE) injection, fentaNYL (SUBLIMAZE) injection, hydrALAZINE, midazolam, polyethylene glycol      Subjective:   Jamilah Kerchner was seen and examined today.  Overnight no new events, afebrile, severe tongue biting.  Opens eyes but no tracking or meaningful recovery  Objective:   Vitals:   11/30/21 1120 11/30/21 1126 11/30/21 1200 11/30/21 1300  BP:  (!) 144/80 137/65 138/63  Pulse:  87 88 84  Resp:  (!) 23 (!) 22 (!) 22  Temp: 98.8 F (37.1 C)     TempSrc:  SpO2:  98% 95% 96%  Weight:      Height:        Intake/Output Summary (Last 24 hours) at 11/30/2021 1342 Last data filed at 11/30/2021 1200 Gross per 24 hour  Intake 1763.66 ml  Output 1500 ml  Net 263.66 ml     Wt Readings from Last 3 Encounters:  11/30/21 72.1 kg  01/06/21 82 kg  12/07/19 90.3 kg     Exam General: NAD, appears comfortable Cardiovascular: S1 S2 auscultated, RRR Respiratory: Clear to auscultation bilaterally Gastrointestinal: Soft, PEG plus ostomy LUQ Ext: no pedal edema bilaterally Neuro: does not follow commands Musculoskeletal: No digital cyanosis, clubbing Skin: No rashes Psych: Normal affect and demeanor, alert and oriented x3    Data Reviewed:  I have personally reviewed following labs and imaging studies  Micro Results No results found for this or any previous visit (from the past 240 hour(s)).  Radiology Reports CT HEAD WO CONTRAST (5MM)  Result Date: 11/20/2021 IMPRESSION: 1. No change from recent head CT to explain the new deficit. 2. Known numerous brain lesions and subacute right cerebral infarcts. Electronically Signed   By: Jorje Guild M.D.   On: 11/20/2021 10:58   CT HEAD WO CONTRAST (5MM)  Result Date: 11/19/2021  IMPRESSION: 1. Relatively well-defined focus of  hypoattenuation in the superior, medial right frontal lobe and adjacent right parietal lobe, consistent with recent infarction, occurring since the brain MRI dated 11/14/2021. 2. Several small hypoattenuating lesions noted in the basal ganglia on the left, cerebral hemispheres and cerebellar hemispheres, corresponding to the lesions noted on the prior brain MRI. Differential diagnosis consistent with abscesses or metastatic disease. 3. Old left superior frontal lobe infarct stable from the prior head CT. Electronically Signed   By: Lajean Manes M.D.   On: 11/19/2021 14:32   CT HEAD WO CONTRAST (5MM)  IMPRESSION: No acute abnormality noted. No significant change from the prior exam. Electronically Signed   By: Inez Catalina M.D.   On: 11/12/2021 19:14   CT CHEST WO CONTRAST  Result Date: 11/19/2021  IMPRESSION: 1. Since the prior chest CT, numerous small ill-defined ground-glass nodular opacities have developed in both lungs. Multifocal infection is suspected. Findings could be inflammatory. 2. Bilateral lower lobe atelectasis, partly obscuring the right lower lobe cavitary consolidative process noted on the prior CT, which otherwise has not changed. Lung base opacities have significantly increased from the previous CT. New trace left pleural effusion. Small areas of atelectasis or infection noted in the right middle lobe and left upper lobe lingula, also new. 3. Stable coronary artery calcifications and aortic atherosclerosis. 4. Prominent mediastinal lymph nodes increased from the prior CT, reactive. Aortic Atherosclerosis (ICD10-I70.0). Electronically Signed   By: Lajean Manes M.D.   On: 11/19/2021 14:43   CT Angio Chest PE W and/or Wo Contrast  Result Date: 11/12/2021 IMPRESSION: CTA of the chest: No evidence of pulmonary emboli. Large mildly cavitary infiltrate in superior segment of right lower lobe consistent with acute pneumonia. Some minimal infiltrate is noted in the left base as well. Small  bleb with air-fluid level within is noted in the medial aspect of the left base. These findings are all new from the prior exam. Resolution of previously seen effusions. 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 CT of the abdomen and pelvis: Postsurgical changes consistent with colostomy. Area of decreased attenuation and enhancement within the lateral segment of the left lobe of the liver with a somewhat mass like appearance along  the margin of the liver. Possible metastatic disease would deserve consideration. Status post right nephrectomy. Electronically Signed   By: Inez Catalina M.D.   On: 11/12/2021 19:33   MR BRAIN W WO CONTRAST  Result Date: 11/14/2021 IMPRESSION: Numerous lesions in the brain. There are lesions in the cerebellar hemispheres and in both cerebral hemispheres widely distributed. Greater than 50 lesions are identified. The lesions have a rounded appearance with restricted diffusion and mild enhancement. Many lesions show evidence of mild associated hemorrhage. Mild edema. Restricted diffusion in the left lateral ventricle likely due to ventriculitis. 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 Differential diagnosis includes metastatic disease and demyelinating disease. Correlate with lumbar puncture results. These results were called by telephone at the time of interpretation on 11/14/2021 at 4:46 pm to provider CHI ELLISON , who verbally acknowledged these results. Electronically Signed   By: Franchot Gallo M.D.   On: 11/14/2021 16:48   CT ABDOMEN PELVIS W CONTRAST  Result Date: 11/12/2021 IMPRESSION: CTA of the chest: No evidence of pulmonary emboli. Large mildly cavitary infiltrate in superior segment of right lower lobe consistent with acute pneumonia. Some minimal infiltrate is noted in the left base as well. Small bleb with air-fluid level within is noted in the medial aspect of the left base. These findings are  all new from the prior exam. Resolution of previously seen effusions. 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 CT of the abdomen and pelvis: Postsurgical changes consistent with colostomy. Area of decreased attenuation and enhancement within the lateral segment of the left lobe of the liver with a somewhat mass like appearance along the margin of the liver. Possible metastatic disease would deserve consideration. Status post right nephrectomy. Electronically Signed   By: Inez Catalina M.D.   On: 11/12/2021 19:33   IR GASTROSTOMY TUBE MOD SED  Result Date: 11/28/2021 IMPRESSION: Percutaneous gastrostomy with placement of a 24-French bumper retention tube in the body of the stomach. This tube can be used for percutaneous feeds beginning in 24 hours after placement. Electronically Signed   By: Aletta Edouard M.D.   On: 11/28/2021 17:17   MR LIVER W WO CONTRAST  Result Date: 11/18/2021  IMPRESSION: 1. Interval increase in dense atelectasis/consolidation of the RIGHT lower lobe. 2. No worrisome lesions within the liver. Hypoperfusion to the subcapsular RIGHT hepatic lobe is favored benign vascular phenomena. 3. Distended gallbladder may relate to fasting state. 4. Persistent region of hypoperfusion to the spleen suggests splenic infarctions. 5. Solitary LEFT kidney without complication. Electronically Signed   By: Suzy Bouchard M.D.   On: 11/18/2021 20:06   DG Chest Port 1 View  Result Date: 11/27/2021 IMPRESSION: 1. Question trace left pleural effusion. Lateral radiograph could further evaluate if clinically indicated. 2. Streaky left basilar opacity which could represent atelectasis, aspiration, and/or pneumonia. 3. Similar prominence of the right hilum with findings in this region better characterized on recent chest CT. Electronically Signed   By: Margaretha Sheffield M.D.   On: 11/27/2021 07:56   EEG adult  Result Date: 11/26/2021 This EEG was obtained while sedated on propofol and is  abnormal due to severe diffuse slowing indicative of global cerebral dysfunction, medication effect, or both. No epileptiform abnormalities were seen during this recording. Su Monks, MD [facility] [phone] If 7pm- 7am, please page neurology on call as listed in [place].   Overnight EEG with video  Result Date: 11/27/2021  IMPRESSION: This  technically difficult study is suggestive of moderate to severe diffuse encephalopathy, nonspecific etiology.  No seizures or definite epileptiform discharges were seen during the study. Patient event button was pressed on 11/26/2021 at 2149 for unclear reasons without concomitant EEG change.  This was most likely not an epileptic event Patient event button was pressed on 11/26/2020 at 2253 for several whole-body tremors, tachycardia with heart rate of 130 and urinary incontinence.  No concomitant EEG changes seem to be in this episode.  This was most likely not an epileptic event Multiple episodes of eye rolling backwards recording during the study without concomitant EEG change.  These episodes are most likely not epileptic Lora Havens    ECHOCARDIOGRAM COMPLETE  Result Date: 11/13/2021 IMPRESSIONS  1. Left ventricular ejection fraction, by estimation, is 65 to 70%. The left ventricle has normal function. The left ventricle has no regional wall motion abnormalities. Left ventricular diastolic parameters are consistent with Grade I diastolic dysfunction (impaired relaxation).  2. Right ventricular systolic function is normal. The right ventricular size is normal.  3. The mitral valve is normal in structure. No evidence of mitral valve regurgitation. Mild to moderate mitral stenosis.  4. The aortic valve is normal in structure. Aortic valve regurgitation is not visualized. Mild aortic valve stenosis. FINDINGS  Left Ventricle: Left ventricular ejection fraction, by estimation, is 65 to 70%. The left ventricle has normal function. The left ventricle has  no regional wall motion abnormalities.   ECHO TEE  Result Date: 11/23/2021 IMPRESSIONS  1. Study terminated early as patient became hypoxic on the vent.  2. The mitral valve is thickened and mildly calcified. Leaflet motion is mildly restricted without significant stenosis (mean gradient 45mHg at HR 70bpm). There are mobile elements visualized on the anterior mitral valve leaflet which could represent possible vegetations given clinical picture although may also be redundant mitral valve tissue vs prior endocarditis. There is mild-to-moderate mitral regurgitation.  3. Left ventricular ejection fraction, by estimation, is 60 to 65%. The left ventricle has normal function.  4. Right ventricular systolic function is normal. The right ventricular size is normal.  5. No left atrial/left atrial appendage thrombus was detected.  6. The aortic valve is tricuspid. There is mild thickening of the aortic valve. Aortic valve regurgitation is not visualized. Aortic valve sclerosis is present, with no evidence of aortic valve stenosis.  7. No other valvular vegetations visualized.  VAS [place]PPER EXTREMITY VENOUS DUPLEX  Result Date: 11/21/2021 UPPER VENOUS STUDY  Summary:  Right: Findings consistent with acute deep vein thrombosis involving the right brachial veins surrounding the PICC line.  Left: No evidence of deep vein thrombosis in the upper extremity. Findings consistent with acute superficial vein thrombosis involving the left cephalic vein.  *See table(s) above for measurements and observations.  Diagnosing physician: CDeitra MayoMD Electronically signed by CDeitra MayoMD on 11/21/2021 at 6:17:06 PM.    Final    Lab Data:  CBC: Recent Labs  Lab 11/25/21 0041 11/25/21 2315 11/27/21 0552 11/28/21 0128 11/29/21 0157 11/30/21 0133  WBC 10.0  --  10.7* 8.6 10.2 8.8  HGB 9.1* 10.5* 9.6* 9.9* 9.8* 10.0*  HCT 30.3* 31.0* 31.2* 32.7* 33.3* 33.0*  MCV 93.5  --  92.6 93.2 94.3 92.4  PLT 298  --   365 388 457* 5254   Basic Metabolic Panel: Recent Labs  Lab 11/24/21 0141 11/25/21 0041 11/26/21 0105 11/27/21 0552 11/28/21 0128 11/29/21 0157 11/29/21 0905 11/30/21 0133  NA 134*   < >  137 137 138 133* 134* 137  K 3.7   < > 4.2 3.4* 4.0 4.6  --  3.5  CL 93*   < > 100 98 99 97*  --  99  CO2 30   < > '28 29 29 26  ' --  28  GLUCOSE 214*   < > 243* 151* 88 160*  --  125*  BUN 41*   < > 34* 29* 26* 22*  --  22*  CREATININE 0.83   < > 0.72 0.77 0.63 0.67  --  0.72  CALCIUM 8.4*   < > 8.2* 8.2* 8.6* 8.6*  --  8.9  MG 2.4  --   --   --  2.3 2.1  --   --   PHOS 3.3  --   --   --  3.1  --   --   --    < > = values in this interval not displayed.   GFR: Estimated Creatinine Clearance: 73 mL/min (by C-G formula based on SCr of 0.72 mg/dL). Liver Function Tests: Recent Labs  Lab 11/28/21 0128  ALBUMIN 2.1*    Coagulation Profile: Recent Labs  Lab 11/27/21 0552  INR 1.1   CBG: Recent Labs  Lab 11/30/21 0435 11/30/21 0505 11/30/21 0550 11/30/21 0751 11/30/21 1115  GLUCAP 115* 109* 109* 154* 167*    Urine analysis:    Component Value Date/Time   COLORURINE YELLOW 11/12/2021 [place] 11/12/2021 1754   LABSPEC <1.005 (L) 11/12/2021 1754   PHURINE 6.0 11/12/2021 1754   GLUCOSEU 250 (A) 11/12/2021 1754   HGBUR MODERATE (A) 11/12/2021 1754   BILIRUBINUR NEGATIVE 11/12/2021 1754   KETONESUR NEGATIVE 11/12/2021 1754   PROTEINUR NEGATIVE 11/12/2021 1754   NITRITE NEGATIVE 11/12/2021 1754   LEUKOCYTESUR LARGE (A) 11/12/2021 1754     Cristel Rail M.D. [facility] 11/30/2021, 1:42 PM  Available via Epic secure chat 7am-7pm After 7 pm, please refer to night coverage provider listed on amion.

## 2021-11-30 NOTE — Plan of Care (Signed)
?  Problem: Clinical Measurements: ?Goal: Diagnostic test results will improve ?Outcome: Progressing ?  ?Problem: Nutrition: ?Goal: Adequate nutrition will be maintained ?Outcome: Progressing ?Note: Pt is tolerating tube feeds well. Will increase by 10cc at 1000 to help achieve goal rate of 65cc/hr ?  ?Problem: Elimination: ?Goal: Will not experience complications related to bowel motility ?Outcome: Progressing ?Goal: Will not experience complications related to urinary retention ?Outcome: Progressing ?  ?Problem: Pain Managment: ?Goal: General experience of comfort will improve ?Outcome: Progressing ?  ?Problem: Clinical Measurements: ?Goal: Respiratory complications will improve ?Outcome: Not Progressing ?Note: Pt did not tolerate wean this morning. Placed back on full ventilator support ? ?  ?Problem: Activity: ?Goal: Risk for activity intolerance will decrease ?Outcome: Not Progressing ?Note: Pt is minimally responsive. Unable to mobilize at this time. ?  ?

## 2021-11-30 NOTE — Progress Notes (Signed)
.   Referring Physician(s): Dr. Tamala Julian  Supervising Physician: Daryll Brod  Patient Status:  [facility] - In-pt  Chief Complaint: Brain abscess  Subjective: Ongoing encephalopathy/non-participatory in exam.  G-tube placed 3/7 by Dr. Kathlene Cote.  Currently in place with TF infusing, nearly at goal.   Allergies: Glimepiride, Sulfa antibiotics, and Effexor [venlafaxine]  Medications: Prior to Admission medications   Medication Sig Start Date End Date Taking? Authorizing Provider  albuterol (VENTOLIN HFA) 108 (90 Base) MCG/ACT inhaler Inhale 2 puffs into the lungs every 6 (six) hours as needed for wheezing or shortness of breath.   Yes [provider]  ALPRAZolam Duanne Moron) 1 MG tablet Take 1 mg by mouth 3 (three) times daily as needed for anxiety. 10/19/21  Yes [provider]  amLODipine (NORVASC) 10 MG tablet TAKE 1 TABLET BY MOUTH ONCE DAILY Patient taking differently: Take 10 mg by mouth daily. 04/12/20  Yes Tobb, Kardie, DO  Cyanocobalamin (VITAMIN B-12 PO) Take 1 tablet by mouth daily.   Yes [provider]  dapagliflozin propanediol (FARXIGA) 10 MG TABS tablet Take 10 mg by mouth daily.   Yes [provider]  gabapentin (NEURONTIN) 800 MG tablet Take 800 mg by mouth 3 (three) times daily. 10/19/21  Yes [provider]  lisinopril (ZESTRIL) 40 MG tablet Take 40 mg by mouth daily.   Yes [provider]  metoprolol succinate (TOPROL-XL) 50 MG 24 hr tablet Take 50 mg by mouth 2 (two) times daily. 10/14/21  Yes [provider]  nitroGLYCERIN (NITROSTAT) 0.4 MG SL tablet Place 1 tablet (0.4 mg total) under the tongue every 5 (five) minutes x 3 doses as needed for chest pain. 02/20/19  Yes Reino Bellis B, NP  pantoprazole (PROTONIX) 40 MG tablet Take 40 mg by mouth 2 (two) times daily. 10/28/21  Yes [provider]  prochlorperazine (COMPAZINE) 10 MG tablet Take 10 mg by mouth every 6 (six) hours as needed for nausea.   Yes  [provider]  sitaGLIPtin (JANUVIA) 50 MG tablet Take 50 mg by mouth daily.   Yes [provider]  aspirin 81 MG EC tablet Take 1 tablet (81 mg total) by mouth daily. Patient not taking: Reported on 11/13/2021 02/20/19   Reino Bellis B, NP  atorvastatin (LIPITOR) 80 MG tablet Take 1 tablet (80 mg total) by mouth daily at 6 PM. Patient not taking: Reported on 11/13/2021 02/20/19   Cheryln Manly, NP  metFORMIN (GLUCOPHAGE) 1000 MG tablet Take 1 tablet (1,000 mg total) by mouth 2 (two) times daily with a meal. Patient not taking: Reported on 11/13/2021 02/20/19   Reino Bellis B, NP  metoprolol tartrate (LOPRESSOR) 50 MG tablet Take 1 tablet (50 mg total) by mouth 2 (two) times daily. Patient not taking: Reported on 11/13/2021 02/20/19   Cheryln Manly, NP     Vital Signs: BP (!) 150/58    Pulse 85    Temp 98.8 F (37.1 C)    Resp (!) 23    Ht $R'5\' 2"'tU$  (1.575 m)    Wt 158 lb 15.2 oz (72.1 kg)    SpO2 96%    BMI 29.07 kg/m   Physical Exam NAD, alert Abdomen: soft, non-distended.  G-tube in place.  Insertion site c/d/I.  No oozing or bleeding.  Close proximity of colostomy stoma with some leakage of stool near G-tube site.   Imaging: IR GASTROSTOMY TUBE MOD SED  Result Date: 11/28/2021 CLINICAL DATA:  Cardiorespiratory arrest and need for percutaneous gastrostomy tube  for long-term in [place] needs. EXAM: PERCUTANEOUS GASTROSTOMY TUBE PLACEMENT ANESTHESIA/SEDATION: Sedation was not administered as the patient is on an IV fentanyl drip. CONTRAST:  40mL OMNIPAQUE IOHEXOL 300 MG/ML  SOLN MEDICATIONS: 2 g IV Ancef. IV antibiotic was administered in an appropriate time interval prior to needle puncture of the skin. FLUOROSCOPY TIME:  2 minutes and 18 seconds.  42.0 mGy. PROCEDURE: The procedure, risks, benefits, and alternatives were explained to the patient's son. Questions regarding the procedure were encouraged and answered. The patient's son understands and consents to the  procedure. A 5-French catheter was then advanced through the patient's mouth under fluoroscopy into the esophagus and to the level of the stomach. This catheter was used to insufflate the stomach with air under fluoroscopy. The abdominal wall was prepped with chlorhexidine in a sterile fashion, and a sterile drape was applied covering the operative field. A sterile gown and sterile gloves were used for the procedure. Local anesthesia was provided with 1% Lidocaine. A skin incision was made in the upper abdominal wall. Under fluoroscopy, an 18 gauge trocar needle was advanced into the stomach. Contrast injection was performed to confirm intraluminal position of the needle tip. A single T tack was then deployed in the lumen of the stomach. This was brought up to tension at the skin surface. Over a guidewire, a 9-French sheath was advanced into the lumen of the stomach. The wire was left in place as a safety wire. A loop snare device from a percutaneous gastrostomy kit was then advanced into the stomach. A floppy guide wire was advanced through the orogastric catheter under fluoroscopy in the stomach. The loop snare advanced through the percutaneous gastric access was used to snare the guide wire. This allowed withdrawal of the loop snare out of the patient's mouth by retraction of the orogastric catheter and wire. A 24-French bumper retention gastrostomy tube was looped around the snare device. It was then pulled back through the patient's mouth. The retention bumper was brought up to the anterior gastric wall. The T tack suture was cut at the skin. The exiting gastrostomy tube was cut to appropriate length and a feeding adapter applied. The catheter was injected with contrast material to confirm position and a fluoroscopic spot image saved. The tube was then flushed with saline. A dressing was applied over the gastrostomy exit site. COMPLICATIONS: None. FINDINGS: The stomach distended well with air allowing safe  placement of the gastrostomy tube. After placement, the tip of the gastrostomy tube lies in the body of the stomach. IMPRESSION: Percutaneous gastrostomy with placement of a 24-French bumper retention tube in the body of the stomach. This tube can be used for percutaneous feeds beginning in 24 hours after placement. Electronically Signed   By: Aletta Edouard M.D.   On: 11/28/2021 17:17   DG Chest Port 1 View  Result Date: 11/27/2021 CLINICAL DATA:  Pleural effusion J90 (ICD-10-CM) EXAM: PORTABLE CHEST 1 VIEW COMPARISON:  Radiograph November 25, 2021.  Chest CT November 19, 2021 FINDINGS: Blunted left costophrenic sulcus; question trace left pleural effusion. No visible pneumothorax. Streaky left basilar opacities. Similar prominence of the right hilum with findings in this region better characterized on prior CT. Tracheostomy tube projects at the level of clavicular heads and over the tracheal air column. Enteric tube courses below the diaphragm outside the field of view. IMPRESSION: 1. Question trace left pleural effusion. Lateral radiograph could further evaluate if clinically indicated. 2. Streaky left basilar opacity which could represent atelectasis, aspiration, and/or  pneumonia. 3. Similar prominence of the right hilum with findings in this region better characterized on recent chest CT. Electronically Signed   By: Margaretha Sheffield M.D.   On: 11/27/2021 07:56   EEG adult  Result Date: 11/26/2021 Derek Jack, MD     11/26/2021  7:40 PM Routine EEG Report Lanore AANYA HAYNES is a 56 y.o. female with a history of multiple brain abscesses who is undergoing an EEG to evaluate for seizures. Report: This EEG was acquired with electrodes placed according to the International 10-20 electrode system (including Fp1, Fp2, F3, F4, C3, C4, P3, P4, O1, O2, T3, T4, T5, T6, A1, A2, Fz, Cz, Pz). The following electrodes were missing or displaced: none. The background primarily consisted of diffuse slowing at 3 Hz with  occasional brief runs of higher frequencies in the theta range. No clear waking rhythm was identified. Some sleep architecture was noted in the form of K complexes. There was no focal slowing. There were no interictal epileptiform discharges. There were no electrographic seizures identified. Photic stimulation and hyperventilation were not performed. Impression and clinical correlation: This EEG was obtained while sedated on propofol and is abnormal due to severe diffuse slowing indicative of global cerebral dysfunction, medication effect, or both. No epileptiform abnormalities were seen during this recording. Su Monks, MD [facility] [phone] If 7pm- 7am, please page neurology on call as listed in [place].   Overnight EEG with video  Result Date: 11/27/2021 Lora Havens, MD     11/28/2021  9:46 AM Patient Name: AMITY ROES MRN: [id number] Epilepsy Attending: Lora Havens Referring Physician/Provider: Donnetta Simpers, MD Duration: 11/26/2021 1841 to 11/27/2021 1841  Patient history: 56 year old woman with a history of cervical cancer, status post nephrectomy 2 months ago, colostomy, CAD, diabetes, depression, hypertension, remote history of stroke without residual deficits who was brought in by EMS for altered mental status.  EEG to evaluate for seizure.  Level of alertness: lethargic  AEDs during EEG study: LEV, Vimpat, propofol  Technical aspects: This EEG study was done with scalp electrodes positioned according to the 10-20 International system of electrode placement. Electrical activity was acquired at a sampling rate of _0  and reviewed with a high frequency filter of _1  and a low frequency filter of _2 . EEG data were recorded continuously and digitally stored.  Description: E EEG showed near continuous generalized and maximal bifrontal sharply contoured 3 to 5 Hz theta-delta slowing.  Generalized sharp transients with triphasic morphology were seen intermittently throughout  the study. Hyperventilation and photic stimulation were not performed.   Patient event button was pressed on 11/26/2021 at 2149 for unclear reasons (patient was behind curtain).  Concomitant EEG before, during and after the event did not show any EEG to suggest seizure Patient event button was pressed on 11/26/2021 at 2253 during which patient had subtle full body tremors per RN, her heart rate increased to 130 followed by urinary incontinence.  Concomitant EEG before, during and after the event did not show any EEG to the seizure. Multiple episodes of eye rolling backwards were recorded during the study without concomitant EEG change. Off note, study was technically difficult due to significant EKG artifact.   ABNORMALITY - Continuous slow, generalized  IMPRESSION: This technically difficult study is suggestive of moderate to severe diffuse encephalopathy, nonspecific etiology.  No seizures or definite epileptiform discharges were seen during the study. Patient event button was pressed on 11/26/2021 at 2149 for unclear reasons without concomitant EEG change.  This was  most likely not an epileptic event Patient event button was pressed on 11/26/2020 at 2253 for several whole-body tremors, tachycardia with heart rate of 130 and urinary incontinence.  No concomitant EEG changes seem to be in this episode.  This was most likely not an epileptic event Multiple episodes of eye rolling backwards recording during the study without concomitant EEG change.  These episodes are most likely not epileptic [place]:  CBC: Recent Labs    11/27/21 0552 11/28/21 0128 11/29/21 0157 11/30/21 0133  WBC 10.7* 8.6 10.2 8.8  HGB 9.6* 9.9* 9.8* 10.0*  HCT 31.2* 32.7* 33.3* 33.0*  PLT 365 388 457* 500*    COAGS: Recent Labs    11/12/21 1508 11/27/21 0552  INR 1.2 1.1  APTT 27  --     BMP: Recent Labs    11/27/21 0552 11/28/21 0128 11/29/21 0157 11/29/21 0905 11/30/21 0133  NA 137 138 133* 134* 137   K 3.4* 4.0 4.6  --  3.5  CL 98 99 97*  --  99  CO2 _0 --  28  GLUCOSE 151* 88 160*  --  125*  BUN 29* 26* 22*  --  22*  CALCIUM 8.2* 8.6* 8.6*  --  8.9  CREATININE 0.77 0.63 0.67  --  0.72  GFRNONAA >60 >60 >60  --  >60    LIVER FUNCTION TESTS: Recent Labs    11/12/21 1508 11/13/21 0228 11/16/21 0415 11/28/21 0128  BILITOT 0.3 0.2* 0.4  --   AST 12* 84* 19  --   ALT 12 51* 25  --   ALKPHOS 58 65 202*  --   PROT 7.0 6.4* 5.4*  --   ALBUMIN 2.5* 2.3* <1.5* 2.1*    Assessment and Plan: Sepsis, brain abscess multifactoral encephalopathy, respiratory failure Patient s/p G-tube placement 3/7.  Site assessed and found intact.  Tube in use without issue.  Juxtaposition near colostomy site will need to be monitored due to potential site infection if stool not diverted.  Recommend frequent dressing changes.  No current needs in IR.   Electronically Signed: Docia Barrier, PA 11/30/2021, 4:37 PM   I spent a total of 15 Minutes at the the patient's bedside AND on the patient's hospital floor or unit, greater than 50% of which was counseling/coordinating care for encephalopathy, sepsis.

## 2021-11-30 NOTE — TOC Progression Note (Addendum)
Transition of Care (TOC) - Progression Note  ? ? ?Patient Details  ?Name: BEYZA BELLINO ?MRN: [id number] ?Date of Birth: October 18, 1965 ? ?Transition of Care (TOC) CM/SW Contact  ?Milas Gain, LCSWA ?Phone Number: ?11/30/2021, 4:20 PM ? ?Clinical Narrative:    ? ?CSW spoke with Santiago Glad with [facility] vent/snf who requested an ABG test for patient. CSW informed MD. CSW informed Santiago Glad that patient does not have cortrak just peg tube and that passr is still currently pending but that CSW spoke with Autumn at [facility] must and should have determination with passr for patient in 1-2 days. [facility] confirms patient needs to be at Fio2 40% or lower on Vent, for vent snf placement.Santiago Glad confirmed [facility] still following to determine if they can make vent snf bed offer for patient. CSW will continue to follow and assist with patients dc planning needs. ? ?Expected Discharge Plan:  (trach snf) ?Barriers to Discharge: Continued Medical Work up ? ?Expected Discharge Plan and Services ?Expected Discharge Plan:  (trach snf) ?In-house Referral: Clinical Social Work ?Discharge Planning Services: CM Consult ?Post Acute Care Choice: [place] ?Living arrangements for the past 2 months: [place] ?                ?  ?  ?  ?  ?  ?  ?  ?  ?  ?  ? ? ?Social Determinants of Health (SDOH) Interventions ?  ? ?Readmission Risk Interventions ?Readmission Risk Prevention Plan 11/23/2021  ?Transportation Screening Complete  ?Medication Review Press photographer) Complete  ?SW Recovery Care/Counseling Consult Complete  ?Palliative Care Screening Complete  ?Some recent data might be hidden  ? ? ?

## 2021-12-01 DIAGNOSIS — Z452 Encounter for adjustment and management of vascular access device: Secondary | ICD-10-CM | POA: Diagnosis not present

## 2021-12-01 DIAGNOSIS — R569 Unspecified convulsions: Secondary | ICD-10-CM | POA: Diagnosis not present

## 2021-12-01 DIAGNOSIS — E119 Type 2 diabetes mellitus without complications: Secondary | ICD-10-CM | POA: Diagnosis not present

## 2021-12-01 DIAGNOSIS — G060 Intracranial abscess and granuloma: Secondary | ICD-10-CM | POA: Diagnosis not present

## 2021-12-01 DIAGNOSIS — I469 Cardiac arrest, cause unspecified: Secondary | ICD-10-CM | POA: Diagnosis not present

## 2021-12-01 LAB — GLUCOSE, CAPILLARY
Glucose-Capillary: 201 mg/dL — ABNORMAL HIGH (ref 70–99)
Glucose-Capillary: 169 mg/dL — ABNORMAL HIGH (ref 70–99)
Glucose-Capillary: 118 mg/dL — ABNORMAL HIGH (ref 70–99)
Glucose-Capillary: 123 mg/dL — ABNORMAL HIGH (ref 70–99)
Glucose-Capillary: 114 mg/dL — ABNORMAL HIGH (ref 70–99)

## 2021-12-01 LAB — BASIC METABOLIC PANEL
Anion gap: 12 (ref 5–15)
BUN: 25 mg/dL — ABNORMAL HIGH (ref 6–20)
CO2: 23 mmol/L (ref 22–32)
Calcium: 8.6 mg/dL — ABNORMAL LOW (ref 8.9–10.3)
Chloride: 100 mmol/L (ref 98–111)
Creatinine, Ser: 0.68 mg/dL (ref 0.44–1.00)
GFR, Estimated: >60 mL/min (ref >60)
Glucose, Bld: 170 mg/dL — ABNORMAL HIGH (ref 70–99)
Potassium: 3.6 mmol/L (ref 3.5–5.1)
Sodium: 135 mmol/L (ref 135–145)

## 2021-12-01 LAB — CBC
HCT: 32.8 % — ABNORMAL LOW (ref 36.0–46.0)
Hemoglobin: 9.9 g/dL — ABNORMAL LOW (ref 12.0–15.0)
MCH: 28.2 pg (ref 26.0–34.0)
MCHC: 30.2 g/dL (ref 30.0–36.0)
MCV: 93.4 fL (ref 80.0–100.0)
Platelets: 529 10*3/uL — ABNORMAL HIGH (ref 150–400)
RBC: 3.51 MIL/uL — ABNORMAL LOW (ref 3.87–5.11)
RDW: 19.8 % — ABNORMAL HIGH (ref 11.5–15.5)
WBC: 8.1 10*3/uL (ref 4.0–10.5)
nRBC: 0 % (ref 0.0–0.2)

## 2021-12-01 MED ORDER — POTASSIUM CHLORIDE 20 MEQ PO PACK
40.0000 meq | PACK | Freq: Once | ORAL | Status: AC
  Administered 2021-12-01: 40 meq
  Filled 2021-12-01: qty 2

## 2021-12-01 NOTE — Progress Notes (Signed)
Subjective: No acute events overnight.  No new concerns per RN. ? ?ROS: Unable to obtain due to poor mental status ? ?Examination ? ?Vital signs in last 24 hours: ?Temp:  [98.6 ?F (37 ?C)-98.9 ?F (37.2 ?C)] 98.9 ?F (37.2 ?C) (03/10 0700) ?Pulse Rate:  [77-136] 92 (03/10 0752) ?Resp:  [17-27] 26 (03/10 0752) ?BP: (104-153)/(58-86) 124/66 (03/10 0752) ?SpO2:  [84 %-98 %] 95 % (03/10 0752) ?FiO2 (%):  [50 %-70 %] 70 % (03/10 0752) ?Weight:  [70.9 kg] 70.9 kg (03/10 0453) ? ?General: lying in bed, eyes rolled upwards ?RS: Trach in place ?Neuro: Opens eyes spontaneously but does not look at the examiner, has right gaze preference, does not track examiner, does not follow commands,  both pupils reacting to light, corneal reflex intact, withdraws to noxious stimuli in all 4 extremities with extensor posturing in bilateral upper extremities ? ?Basic Metabolic Panel: ?Recent Labs  ?Lab 11/27/21 ?7169 11/28/21 ?0128 11/29/21 ?0157 11/29/21 ?6789 11/30/21 ?0133 12/01/21 ?3810  ?NA 137 138 133* 134* 137 135  ?K 3.4* 4.0 4.6  --  3.5 3.6  ?CL 98 99 97*  --  99 100  ?CO2 '29 29 26  '$ --  28 23  ?GLUCOSE 151* 88 160*  --  125* 170*  ?BUN 29* 26* 22*  --  22* 25*  ?CREATININE 0.77 0.63 0.67  --  0.72 0.68  ?CALCIUM 8.2* 8.6* 8.6*  --  8.9 8.6*  ?MG  --  2.3 2.1  --   --   --   ?PHOS  --  3.1  --   --   --   --   ? ? ?CBC: ?Recent Labs  ?Lab 11/27/21 ?1751 11/28/21 ?0128 11/29/21 ?0157 11/30/21 ?0133 12/01/21 ?0258  ?WBC 10.7* 8.6 10.2 8.8 8.1  ?HGB 9.6* 9.9* 9.8* 10.0* 9.9*  ?HCT 31.2* 32.7* 33.3* 33.0* 32.8*  ?MCV 92.6 93.2 94.3 92.4 93.4  ?PLT 365 388 457* 500* 529*  ? ? ? ?Coagulation Studies: ?No results for input(s): LABPROT, INR in the last 72 hours. ? ?Imaging ?No new brain imaging ?  ?  ?ASSESSMENT AND PLAN: 56 year old female  with a history of cervical cancer, status post nephrectomy 2 months ago, colostomy, chronic occlusion of the left common carotid artery, CAD, diabetes, depression, hypertension, remote history of stroke  without residual deficits who was brought in by EMS for altered mental status. She had had a flulike illness for a week PTA and was found confused on 2/19. The patient had acute PEA arrest while in the ED on Sunday (2/19) with CPR time of 2 minutes. Diagnosed with acute respiratory failure with hypoxemia and was intubated at that time. MRI brain performed on Tuesday (2/21)revealed numerous cerebral abscesses, LP consistent with Meningitis.  Currently on IV vancomycin for 8 weeks (last date April 16).   ?  ?Acute bacterial meningitis ?Acute infectious encephalopathy ?Seizure ?Brain abscess ?- No Seizure-like episodes overnight ?  ?Recommendations ?-Continue gabapentin, Keppra and Vimpat at current dose ?-Patient has been off sedation for over 48 hours with minimal improvement in mental status.  I am concerned patient has had significant neurological injury with minimal chances of meaningful neurologic recovery.  However, we will continue to assess periodically to look for improvement.   ?- Continue seizure precautions ?- Management of rest of comorbidities per primary team ?- Discussed plan with patient's RN at bedside ? ?I have spent a total of 26  minutes with the patient reviewing hospital notes,  test results, labs and  examining the patient as well as establishing an assessment and plan.  > 50% of time was spent in direct patient care. ? ?Zeb Comfort ?Epilepsy ?[facility] ?For questions after 5pm please refer to AMION to reach the Neurologist on call ? ?

## 2021-12-01 NOTE — Progress Notes (Signed)
Physical Therapy Treatment ?Patient Details ?Name: Kristen Murphy ?MRN: [id number] ?DOB: [birth year] ?Today's Date: 12/01/2021 ? ? ?History of Present Illness This 56 y.o. female admitted 11/12/21 with AMS, and slurred speech.  In ED she experienced acute hypoxemia with PEA arrest and emergent ET intubation and CPR.  ROSC after 2 mins. 2/19  MRI on  2/21 with numerous lesions (>50) with mild associated hemmorrhage and edema concerning for brain abcesses (not a good candidate for lesion aspiration).   2/22 LP consistent with bacterial meningitis.  Respiratory cx +MRSA; repeat CT of brain 2/26 showed Relatively well-defined focus of hypoattenuation in the superior,medial right frontal lobe and adjacent right parietal lobe,consistent with recent infarction,  2/28 LTM with ? seizures, 3/2 TEE + for mobile mass on MV with mild to moderate MR EF 60-65% - endocarditis.  Found to have Rt UE DVT and SVT of LT UE.  Trach  3/4.   PEG tube 3/7 PMH includes:  Anemia, anxiety, asthma, cervical CA, CKD, CAD, depression, DM, HTN, seizures, h/o stroke without residual deficits ? ?  ?PT Comments  ? ? Patient requiring higher vent settings today; currently at PEEP 8 and Fi02 60% on trach via vent. Pt continues to be minimally responsive to any stimulus. Not following any commands. Withdraws to noxious stimulus in all extremities. No purposeful movement or ability to track despite eyes being opened at times. Tends to roll eyes upwards and to the right. PROM performed to all extremities. Extensor tone present in RUE. No changes in vital signs during PROM. Due to lack of progression and/or change in cognitive status, PT to sign off as pt not appropriate for therapy services at this time. Discharge from therapy. ?  ?Recommendations for follow up therapy are one component of a multi-disciplinary discharge planning process, led by the attending physician.  Recommendations may be updated based on patient status, additional functional criteria  and insurance authorization. ? ?Follow Up Recommendations ? Long-term institutional care without follow-up therapy ?  ?  ?Assistance Recommended at Discharge Frequent or constant Supervision/Assistance  ?Patient can return home with the following Two people to help with walking and/or transfers;Two people to help with bathing/dressing/bathroom;Assistance with cooking/housework;Assistance with feeding;Direct supervision/assist for medications management;Direct supervision/assist for financial management;Assist for transportation;Help with stairs or ramp for entrance ?  ?Equipment Recommendations ? Other (comment) (defer to next venue)  ?  ?Recommendations for Other Services   ? ? ?  ?Precautions / Restrictions Precautions ?Precautions: Fall;Other (comment) ?Precaution Comments: Trach on vent; cortrak; bit through tongue, prevalon boots ?Restrictions ?Weight Bearing Restrictions: No  ?  ? ?Mobility ? Bed Mobility ?  ?  ?  ?  ?  ?  ?  ?General bed mobility comments: Placed bed in partial chair position elevating head. ?  ? ?Transfers ?  ?  ?  ?  ?  ?  ?  ?  ?  ?General transfer comment: unable ?  ? ?Ambulation/Gait ?  ?  ?  ?  ?  ?  ?  ?General Gait Details: unable ? ? ?Stairs ?  ?  ?  ?  ?  ? ? ?Wheelchair Mobility ?  ? ?Modified Rankin (Stroke Patients Only) ?Modified Rankin (Stroke Patients Only) ?Pre-Morbid Rankin Score: No significant disability ?Modified Rankin: Severe disability ? ? ?  ?Balance   ?  ?  ?  ?  ?  ?  ?  ?  ?  ?  ?  ?  ?  ?  ?  ?  ?  ?  ?  ? ?  ?  Cognition   ?Behavior During Therapy: Flat affect ?Overall Cognitive Status: Impaired/Different from baseline ?  ?  ?  ?  ?  ?  ?  ?  ?  ?  ?  ?  ?  ?  ?  ?  ?General Comments: Eyes varying between being opened and closed during session, rolling up and to the right randomly, not focusing on therapist. Not following any commands. Withdraws from noxious stimulus in all extremities  Extensor tone noted in RUE with noxious stimulus. Secretions noted out of left  side of mouth and biting down present as well. ?  ?  ? ?  ?Exercises General Exercises - Upper Extremity ?Shoulder Flexion: PROM, Both, 10 reps, Supine ?Shoulder ABduction: PROM, Both, 10 reps, Supine ?Shoulder ADduction: PROM, Both, 10 reps, Supine ?Elbow Flexion: PROM, Both, 10 reps, Supine ?Elbow Extension: PROM, Both, 10 reps, Supine ?Wrist Flexion: PROM, Both, 10 reps, Supine ?Wrist Extension: PROM, Both, 10 reps, Supine ?Digit Composite Flexion: PROM, Both, 10 reps, Supine ?Composite Extension: PROM, Both, 10 reps, Supine ?General Exercises - Lower Extremity ?Ankle Circles/Pumps: PROM, Both, 10 reps, Supine ?Heel Slides: PROM, Both, 5 reps, Supine ?Hip ABduction/ADduction: PROM, Both, 5 reps, Supine ? ?  ?General Comments General comments (skin integrity, edema, etc.): VSS on trach via vent, PEEP 8, 60% Fi02. ?  ?  ? ?Pertinent Vitals/Pain Pain Assessment ?Pain Assessment: Faces ?Faces Pain Scale: No hurt  ? ? ?Home Living   ?  ?  ?  ?  ?  ?  ?  ?  ?  ?   ?  ?Prior Function    ?  ?  ?   ? ?PT Goals (current goals can now be found in the care plan section) Progress towards PT goals: Not progressing toward goals - comment;Goals met/education completed, patient discharged from PT ? ?  ?Frequency ? ? ? Min 1X/week ? ? ? ?  ?PT Plan Current plan remains appropriate;Frequency needs to be updated  ? ? ?Co-evaluation   ?  ?  ?  ?  ? ?  ?AM-PAC PT "6 Clicks" Mobility   ?Outcome Measure ? Help needed turning from your back to your side while in a flat bed without using bedrails?: Total ?Help needed moving from lying on your back to sitting on the side of a flat bed without using bedrails?: Total ?Help needed moving to and from a bed to a chair (including a wheelchair)?: Total ?Help needed standing up from a chair using your arms (e.g., wheelchair or bedside chair)?: Total ?Help needed to walk in hospital room?: Total ?Help needed climbing 3-5 steps with a railing? : Total ?6 Click Score: 6 ? ?  ?End of Session Equipment  Utilized During Treatment: Oxygen (trach via vent, PEEP 8, 60% Fi02) ?  ?Patient left: in bed;with call bell/phone within reach;with bed alarm set;Other (comment) (prevalon boots) ?Nurse Communication: Mobility status ?PT Visit Diagnosis: Muscle weakness (generalized) (M62.81);Difficulty in walking, not elsewhere classified (R26.2);Other symptoms and signs involving the nervous system (R29.898) ?  ? ? ?Time: 8016-5537 ?PT Time Calculation (min) (ACUTE ONLY): 11 min ? ?Charges:  $Therapeutic Exercise: 8-22 mins          ?          ? ?Marisa Severin, PT, DPT ?Acute Rehabilitation Services ?Pager [phone] ?Office [phone] ? ? ? ? ? ?[place] ?12/01/2021, 3:56 PM ? ?

## 2021-12-01 NOTE — Progress Notes (Signed)
[facility] ADULT ICU REPLACEMENT PROTOCOL ? ? ?The patient does apply for the [facility] Adult ICU Electrolyte Replacment Protocol based on the criteria listed below:  ? ?1.Exclusion criteria: TCTS patients, ECMO patients, and Dialysis patients ?2. Is GFR >/= 30 ml/min? Yes.    ?Patient's GFR today is >60 ?3. Is SCr </= 2? Yes.   ?Patient's SCr is 0.68 mg/dL ?4. Did SCr increase >/= 0.5 in 24 hours? No. ?5.Pt's weight >40kg  Yes.   ?6. Abnormal electrolyte(s): K+ 3.6  ?7. Electrolytes replaced per protocol ?8.  Call MD STAT for K+ </= 2.5, Phos </= 1, or Mag </= 1 ?Physician:  n/a ? ?Kristen Murphy 12/01/2021 3:43 AM ? ?

## 2021-12-01 NOTE — Progress Notes (Signed)
[facility]                                                                              Patient Demographics  Kristen Murphy, is a 56 y.o. female, DOB - [birth year], VVK:[id number]  Admit date - 11/12/2021   Admitting Physician Collene Gobble, MD  Outpatient Primary MD for the patient is Gweneth Fritter, FNP  Outpatient specialists:   LOS - 19  days   Medical records reviewed and are as summarized below:    Chief Complaint  Patient presents with   Altered Mental Status       Brief summary   Per admission history by PCCM on 2/19 Patient is a 56 year old female with history of tobacco use, cervical CA, post nephrectomy and colostomy, CAD with LAD stenting 01/2019, diabetes, depression, hypertension, remote CVA, substance use, was brought in by her son to the ED on 2/19 with fever, altered mental status and slurred speech. Apparently, she had a flulike illness for a week, was found confused on 2/19.  There was some suspicion by son that she may have been taking more prescription narcotics, many NSAIDs.  She was disoriented in the ED, febrile 103 F, hyperglycemic.  In ED, she also experienced acute hypoxemia, had some blood from her mouth, emesis versus hemoptysis.  Reported it to PEA and required emergent ED intubation and CPR.  ROSC after 2 minutes, OG tube placed with bright red blood obtained.  Chest x-ray with mild right hilar prominence, more notable on postintubation films, no infiltrates or effusions. Received empiric antibiotics, packed RBC transfusion and admitted to ICU. 2/19 Intubated. Head CT  > no acute CT findings, old left basal ganglia lacunar, old left frontal cortical and subcortical infarct MRI brain 2/21 numerous lesions with mild associated hemorrhage and edema. Neurosurgery reviewed imaging and patient not a good candidate for lesion aspiration. 2/24 MR Brain - Numerous lesions with mild associated hemorrhage. Mild edema. Concerned for brain  abscesses 2/22 LP consistent with bacterial meningitis. Respiratory Cx +MRSA 2/25 MR Liver with no lesions, hypoperfusion to the subcapsular right hepatic lobe, favor benign vascular phenomena, distended gallbladder, persistent region of hypoperfusion. EEG and LTM 2/25 neg for seizures.  Repeat head CT 2/27 with no acute changes  2/28 No acute events overnight, LTM read ? Seizure.  Cefepime switched to meropenem. 3/2 LTM stopped. Meropenem changed to CTX. TEE + mobile mass on MV w/ mild to mod MR EF 60-65% 3/3 change to IV heparin for possible trach  3/4 trach placed 3/5 ? Seizure activity, LTM restarted, weaned 8 hrs  3/6 remains on LTM- no seizure to correlate with episodes of HR 130s, jaw clenching, urinary incontinence.  Linezolid changed to vanc per ID for longterm therapy, weaned x 8 hours, diuresing 3/7 hypoglycemic overnight w/ held TF, PEG placed per IR, off propofol, LTM d/c'd, weaned x 8 hrs 8/8, 50% 3/9: Patient transferred to [facility], assumed care     Assessment & Plan    Principal Problem: Sepsis with brain abscess, MRSA bacteremia, MV endocarditis, pulmonary spread -Prolonged antibiotics per ID.  Remains on ceftriaxone/vancomycin (changed from linezolid 3/6 per ID), plan  for 8 weeks through April 16 or if repeat MRI shows clearance sooner -PICC cannot be placed due to thrombi in both upper extremities, continue peripheral IVs   Active Problems: Acute hypoxemic respiratory failure -Due to necrotizing MRSA pneumonia -CCM will continue to follow for vent support, ongoing trach care -Requested RN to place bite block for the tongue laceration  Acute multifactorial encephalopathy -Secondary to #1, postarrest and sepsis. -Underwent prolonged EEG, stopped 3/7 due to no seizure activity -Neurology following, continue Keppra, Vimpat, gabapentin at current dose.  Continue seizure precautions -Per neurology, significant neurological injury, minimal chances of meaningful  recovery  PICC associated upper extremity DVT bilaterally -Continue peripheral IVs, was on IV heparin, transitioned to Eliquis x 3 months  Nutrition -PEG tube placed on 3/7, core track discontinued -Continue tube feeds and medications via PEG tube  Diabetes mellitus, type II -Continue sliding scale insulin, Semglee 9 units twice daily, NovoLog 12 units every 4 hours  Code Status: Full code.   DVT Prophylaxis:  SCDs Start: 11/12/21 1839 apixaban (ELIQUIS) tablet 10 mg  apixaban (ELIQUIS) tablet 5 mg   Level of Care: Level of care: ICU Family Communication:  Disposition Plan:     Status is: Inpatient Family wants another 3 weeks to assess for any new improvements if no progression, will consider transition to comfort care.  Time Spent in minutes   25 minutes  Procedures:  Mechanical ventilation PEG tube placement  Consultants:   Neurology Critical care Interventional radiology  Antimicrobials:  IV vancomycin IV ceftriaxone   Medications  Scheduled Meds:  amLODipine  10 mg Per Tube Daily   apixaban  10 mg Per Tube BID   Followed by   Derrill Memo ON 12/06/2021] apixaban  5 mg Per Tube BID   artificial tears   Both Eyes Q8H   chlorhexidine gluconate (MEDLINE KIT)  15 mL Mouth Rinse BID   Chlorhexidine Gluconate Cloth  6 each Topical Daily   docusate  100 mg Per Tube BID   furosemide  40 mg Per Tube BID   gabapentin  300 mg Per Tube Q12H   insulin aspart  0-9 Units Subcutaneous Q4H   insulin aspart  12 Units Subcutaneous Q4H   insulin glargine-yfgn  9 Units Subcutaneous BID   mouth rinse  15 mL Mouth Rinse 10 times per day   metoprolol tartrate  25 mg Per Tube BID   oxyCODONE  5 mg Per Tube Q6H   pantoprazole sodium  40 mg Per Tube Daily   polyethylene glycol  17 g Per Tube Daily   Continuous Infusions:  sodium chloride Stopped (11/21/21 1033)   sodium chloride 20 mL/hr at 11/23/21 1620   sodium chloride Stopped (11/26/21 1648)   cefTRIAXone (ROCEPHIN)  IV 2 g  (12/01/21 0931)   feeding supplement (VITAL AF 1.2 CAL) 65 mL/hr at 12/01/21 0400   lacosamide (VIMPAT) IV 100 mg (12/01/21 1047)   levETIRAcetam 1,500 mg (12/01/21 0904)   vancomycin Stopped (11/30/21 2328)   PRN Meds:.sodium chloride, acetaminophen **OR** acetaminophen (TYLENOL) oral liquid 160 mg/5 mL **OR** acetaminophen, albuterol, fentaNYL (SUBLIMAZE) injection, fentaNYL (SUBLIMAZE) injection, hydrALAZINE, midazolam, polyethylene glycol      Subjective:   Chevi Lomanto was seen and examined today.  Opens eyes spontaneously to the voice commands however does not track or make any eye contact.  Not following any commands.  Overnight no acute issues.  No fevers   Objective:   Vitals:   12/01/21 1104 12/01/21 1130 12/01/21 1200 12/01/21 1300  BP:  131/70  (!) 148/75 135/72  Pulse: 83  91 80  Resp: (!) 22  (!) 22 (!) 22  Temp:  99.1 F (37.3 C)    TempSrc:  Axillary    SpO2: 99%  97% 94%  Weight:      Height:        Intake/Output Summary (Last 24 hours) at 12/01/2021 1348 Last data filed at 12/01/2021 1100 Gross per 24 hour  Intake 1536.5 ml  Output 2300 ml  Net -763.5 ml     Wt Readings from Last 3 Encounters:  12/01/21 70.9 kg  01/06/21 82 kg  12/07/19 90.3 kg   Physical Exam General: Lying in bed, appears comfortable, opens eyes to voice commands and rolled upwards.  No tracking or eye contact.  Trach in place Cardiovascular: S1 S2 clear, RRR.  Respiratory: CTAB anteriorly Gastrointestinal: Soft, nontender, nondistended, NBS, PEG tube in place Ext: no pedal edema bilaterally Neuro: does not follow any commands   Data Reviewed:  I have personally reviewed following labs and imaging studies  Radiology Reports CT HEAD WO CONTRAST (5MM)  Result Date: 11/20/2021 IMPRESSION: 1. No change from recent head CT to explain the new deficit. 2. Known numerous brain lesions and subacute right cerebral infarcts. Electronically Signed   By: Jorje Guild M.D.   On:  11/20/2021 10:58   CT HEAD WO CONTRAST (5MM)  Result Date: 11/19/2021  IMPRESSION: 1. Relatively well-defined focus of hypoattenuation in the superior, medial right frontal lobe and adjacent right parietal lobe, consistent with recent infarction, occurring since the brain MRI dated 11/14/2021. 2. Several small hypoattenuating lesions noted in the basal ganglia on the left, cerebral hemispheres and cerebellar hemispheres, corresponding to the lesions noted on the prior brain MRI. Differential diagnosis consistent with abscesses or metastatic disease. 3. Old left superior frontal lobe infarct stable from the prior head CT. Electronically Signed   By: Lajean Manes M.D.   On: 11/19/2021 14:32   CT HEAD WO CONTRAST (5MM)  IMPRESSION: No acute abnormality noted. No significant change from the prior exam. Electronically Signed   By: Inez Catalina M.D.   On: 11/12/2021 19:14   CT CHEST WO CONTRAST  Result Date: 11/19/2021  IMPRESSION: 1. Since the prior chest CT, numerous small ill-defined ground-glass nodular opacities have developed in both lungs. Multifocal infection is suspected. Findings could be inflammatory. 2. Bilateral lower lobe atelectasis, partly obscuring the right lower lobe cavitary consolidative process noted on the prior CT, which otherwise has not changed. Lung base opacities have significantly increased from the previous CT. New trace left pleural effusion. Small areas of atelectasis or infection noted in the right middle lobe and left upper lobe lingula, also new. 3. Stable coronary artery calcifications and aortic atherosclerosis. 4. Prominent mediastinal lymph nodes increased from the prior CT, reactive. Aortic Atherosclerosis (ICD10-I70.0). Electronically Signed   By: Lajean Manes M.D.   On: 11/19/2021 14:43   CT Angio Chest PE W and/or Wo Contrast  Result Date: 11/12/2021 IMPRESSION: CTA of the chest: No evidence of pulmonary emboli. Large mildly cavitary infiltrate in superior  segment of right lower lobe consistent with acute pneumonia. Some minimal infiltrate is noted in the left base as well. Small bleb with air-fluid level within is noted in the medial aspect of the left base. These findings are all new from the prior exam. Resolution of previously seen effusions. 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 CT of the abdomen and pelvis: Postsurgical changes consistent with colostomy. Area  of decreased attenuation and enhancement within the lateral segment of the left lobe of the liver with a somewhat mass like appearance along the margin of the liver. Possible metastatic disease would deserve consideration. Status post right nephrectomy. Electronically Signed   By: Inez Catalina M.D.   On: 11/12/2021 19:33   MR BRAIN W WO CONTRAST  Result Date: 11/14/2021 IMPRESSION: Numerous lesions in the brain. There are lesions in the cerebellar hemispheres and in both cerebral hemispheres widely distributed. Greater than 50 lesions are identified. The lesions have a rounded appearance with restricted diffusion and mild enhancement. Many lesions show evidence of mild associated hemorrhage. Mild edema. Restricted diffusion in the left lateral ventricle likely due to ventriculitis. 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 Differential diagnosis includes metastatic disease and demyelinating disease. Correlate with lumbar puncture results. These results were called by telephone at the time of interpretation on 11/14/2021 at 4:46 pm to provider CHI ELLISON , who verbally acknowledged these results. Electronically Signed   By: Franchot Gallo M.D.   On: 11/14/2021 16:48   CT ABDOMEN PELVIS W CONTRAST  Result Date: 11/12/2021 IMPRESSION: CTA of the chest: No evidence of pulmonary emboli. Large mildly cavitary infiltrate in superior segment of right lower lobe consistent with acute pneumonia. Some minimal infiltrate is noted in the  left base as well. Small bleb with air-fluid level within is noted in the medial aspect of the left base. These findings are all new from the prior exam. Resolution of previously seen effusions. 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 CT of the abdomen and pelvis: Postsurgical changes consistent with colostomy. Area of decreased attenuation and enhancement within the lateral segment of the left lobe of the liver with a somewhat mass like appearance along the margin of the liver. Possible metastatic disease would deserve consideration. Status post right nephrectomy. Electronically Signed   By: Inez Catalina M.D.   On: 11/12/2021 19:33   IR GASTROSTOMY TUBE MOD SED  Result Date: 11/28/2021 IMPRESSION: Percutaneous gastrostomy with placement of a 24-French bumper retention tube in the body of the stomach. This tube can be used for percutaneous feeds beginning in 24 hours after placement. Electronically Signed   By: Aletta Edouard M.D.   On: 11/28/2021 17:17   MR LIVER W WO CONTRAST  Result Date: 11/18/2021  IMPRESSION: 1. Interval increase in dense atelectasis/consolidation of the RIGHT lower lobe. 2. No worrisome lesions within the liver. Hypoperfusion to the subcapsular RIGHT hepatic lobe is favored benign vascular phenomena. 3. Distended gallbladder may relate to fasting state. 4. Persistent region of hypoperfusion to the spleen suggests splenic infarctions. 5. Solitary LEFT kidney without complication. Electronically Signed   By: Suzy Bouchard M.D.   On: 11/18/2021 20:06   DG Chest Port 1 View  Result Date: 11/27/2021 IMPRESSION: 1. Question trace left pleural effusion. Lateral radiograph could further evaluate if clinically indicated. 2. Streaky left basilar opacity which could represent atelectasis, aspiration, and/or pneumonia. 3. Similar prominence of the right hilum with findings in this region better characterized on recent chest CT. Electronically Signed   By: Margaretha Sheffield M.D.    On: 11/27/2021 07:56   EEG adult  Result Date: 11/26/2021 This EEG was obtained while sedated on propofol and is abnormal due to severe diffuse slowing indicative of global cerebral dysfunction, medication effect, or both. No epileptiform abnormalities were seen during this recording. Su Monks, MD [facility] [phone] If 7pm-  7am, please page neurology on call as listed in [place].   Overnight EEG with video  Result Date: 11/27/2021  IMPRESSION: This technically difficult study is suggestive of moderate to severe diffuse encephalopathy, nonspecific etiology.  No seizures or definite epileptiform discharges were seen during the study. Patient event button was pressed on 11/26/2021 at 2149 for unclear reasons without concomitant EEG change.  This was most likely not an epileptic event Patient event button was pressed on 11/26/2020 at 2253 for several whole-body tremors, tachycardia with heart rate of 130 and urinary incontinence.  No concomitant EEG changes seem to be in this episode.  This was most likely not an epileptic event Multiple episodes of eye rolling backwards recording during the study without concomitant EEG change.  These episodes are most likely not epileptic Lora Havens    ECHOCARDIOGRAM COMPLETE  Result Date: 11/13/2021 IMPRESSIONS  1. Left ventricular ejection fraction, by estimation, is 65 to 70%. The left ventricle has normal function. The left ventricle has no regional wall motion abnormalities. Left ventricular diastolic parameters are consistent with Grade I diastolic dysfunction (impaired relaxation).  2. Right ventricular systolic function is normal. The right ventricular size is normal.  3. The mitral valve is normal in structure. No evidence of mitral valve regurgitation. Mild to moderate mitral stenosis.  4. The aortic valve is normal in structure. Aortic valve regurgitation is not visualized. Mild aortic valve stenosis. FINDINGS  Left Ventricle: Left  ventricular ejection fraction, by estimation, is 65 to 70%. The left ventricle has normal function. The left ventricle has no regional wall motion abnormalities.   ECHO TEE  Result Date: 11/23/2021 IMPRESSIONS  1. Study terminated early as patient became hypoxic on the vent.  2. The mitral valve is thickened and mildly calcified. Leaflet motion is mildly restricted without significant stenosis (mean gradient 75mHg at HR 70bpm). There are mobile elements visualized on the anterior mitral valve leaflet which could represent possible vegetations given clinical picture although may also be redundant mitral valve tissue vs prior endocarditis. There is mild-to-moderate mitral regurgitation.  3. Left ventricular ejection fraction, by estimation, is 60 to 65%. The left ventricle has normal function.  4. Right ventricular systolic function is normal. The right ventricular size is normal.  5. No left atrial/left atrial appendage thrombus was detected.  6. The aortic valve is tricuspid. There is mild thickening of the aortic valve. Aortic valve regurgitation is not visualized. Aortic valve sclerosis is present, with no evidence of aortic valve stenosis.  7. No other valvular vegetations visualized.  VAS [place]PPER EXTREMITY VENOUS DUPLEX  Result Date: 11/21/2021 UPPER VENOUS STUDY  Summary:  Right: Findings consistent with acute deep vein thrombosis involving the right brachial veins surrounding the PICC line.  Left: No evidence of deep vein thrombosis in the upper extremity. Findings consistent with acute superficial vein thrombosis involving the left cephalic vein.  *See table(s) above for measurements and observations.  Diagnosing physician: CDeitra MayoMD Electronically signed by CDeitra MayoMD on 11/21/2021 at 6:17:06 PM.    Final    Lab Data:  CBC: Recent Labs  Lab 11/27/21 0552 11/28/21 0128 11/29/21 0157 11/30/21 0133 12/01/21 0108  WBC 10.7* 8.6 10.2 8.8 8.1  HGB 9.6* 9.9* 9.8* 10.0*  9.9*  HCT 31.2* 32.7* 33.3* 33.0* 32.8*  MCV 92.6 93.2 94.3 92.4 93.4  PLT 365 388 457* 500* 5734   Basic Metabolic Panel: Recent Labs  Lab 11/27/21 0552 11/28/21 0128 11/29/21 0157 11/29/21 0287603/09/23 0133 12/01/21 08115  NA 137 138 133* 134* 137 135  K 3.4* 4.0 4.6  --  3.5 3.6  CL 98 99 97*  --  99 100  CO2 '29 29 26  ' --  28 23  GLUCOSE 151* 88 160*  --  125* 170*  BUN 29* 26* 22*  --  22* 25*  CREATININE 0.77 0.63 0.67  --  0.72 0.68  CALCIUM 8.2* 8.6* 8.6*  --  8.9 8.6*  MG  --  2.3 2.1  --   --   --   PHOS  --  3.1  --   --   --   --     Liver Function Tests: Recent Labs  Lab 11/28/21 0128  ALBUMIN 2.1*    Coagulation Profile: Recent Labs  Lab 11/27/21 0552  INR 1.1   CBG: Recent Labs  Lab 11/30/21 1921 11/30/21 2332 12/01/21 0359 12/01/21 0728 12/01/21 1130  GLUCAP 150* 161* 201* 169* 118*    Urine analysis:    Component Value Date/Time   COLORURINE YELLOW 11/12/2021 [place] 11/12/2021 1754   LABSPEC <1.005 (L) 11/12/2021 1754   PHURINE 6.0 11/12/2021 1754   GLUCOSEU 250 (A) 11/12/2021 1754   HGBUR MODERATE (A) 11/12/2021 1754   BILIRUBINUR NEGATIVE 11/12/2021 1754   KETONESUR NEGATIVE 11/12/2021 1754   PROTEINUR NEGATIVE 11/12/2021 1754   NITRITE NEGATIVE 11/12/2021 1754   LEUKOCYTESUR LARGE (A) 11/12/2021 1754     Hibo Blasdell M.D. [facility] 12/01/2021, 1:48 PM  Available via Epic secure chat 7am-7pm After 7 pm, please refer to night coverage provider listed on amion.

## 2021-12-01 NOTE — TOC Progression Note (Signed)
Transition of Care (TOC) - Progression Note  ? ? ?Patient Details  ?Name: Kristen Murphy ?MRN: [id number] ?Date of Birth: [birth year] ? ?Transition of Care (TOC) CM/SW Contact  ?Milas Gain, LCSWA ?Phone Number: ?12/01/2021, 11:27 AM ? ?Clinical Narrative:    ? ?CSW spoke with Santiago Glad with [facility] and updated her that patient is now at [facility] 60% on vent. Santiago Glad reports ABG test can wait to be completed when patient is back down to Fio2 of 40% on vent. CSW informed RN and MD. Santiago Glad request for CSW to follow back up with her on Monday. Patients passr has been approved.CSW will continue to follow and assist with patients dc planning needs. ? ? ?Expected Discharge Plan:  (trach snf) ?Barriers to Discharge: Continued Medical Work up ? ?Expected Discharge Plan and Services ?Expected Discharge Plan:  (trach snf) ?In-house Referral: Clinical Social Work ?Discharge Planning Services: CM Consult ?Post Acute Care Choice: [place] ?Living arrangements for the past 2 months: [place] ?                ?  ?  ?  ?  ?  ?  ?  ?  ?  ?  ? ? ?Social Determinants of Health (SDOH) Interventions ?  ? ?Readmission Risk Interventions ?Readmission Risk Prevention Plan 11/23/2021  ?Transportation Screening Complete  ?Medication Review Press photographer) Complete  ?SW Recovery Care/Counseling Consult Complete  ?Palliative Care Screening Complete  ?Some recent data might be hidden  ? ? ?

## 2021-12-01 NOTE — Progress Notes (Signed)
RT NOTE: patient does not meet SBT criteria for this AM due to PEEP and FIO2 requirements.  Will continue to monitor.  ?

## 2021-12-02 DIAGNOSIS — C530 Malignant neoplasm of endocervix: Secondary | ICD-10-CM | POA: Diagnosis not present

## 2021-12-02 DIAGNOSIS — E119 Type 2 diabetes mellitus without complications: Secondary | ICD-10-CM | POA: Diagnosis not present

## 2021-12-02 DIAGNOSIS — G060 Intracranial abscess and granuloma: Secondary | ICD-10-CM | POA: Diagnosis not present

## 2021-12-02 DIAGNOSIS — I469 Cardiac arrest, cause unspecified: Secondary | ICD-10-CM | POA: Diagnosis not present

## 2021-12-02 LAB — BASIC METABOLIC PANEL
Anion gap: 12 (ref 5–15)
BUN: 28 mg/dL — ABNORMAL HIGH (ref 6–20)
CO2: 22 mmol/L (ref 22–32)
Calcium: 9 mg/dL (ref 8.9–10.3)
Chloride: 100 mmol/L (ref 98–111)
Creatinine, Ser: 0.67 mg/dL (ref 0.44–1.00)
GFR, Estimated: >60 mL/min (ref >60)
Glucose, Bld: 164 mg/dL — ABNORMAL HIGH (ref 70–99)
Potassium: 4.2 mmol/L (ref 3.5–5.1)
Sodium: 134 mmol/L — ABNORMAL LOW (ref 135–145)

## 2021-12-02 LAB — GLUCOSE, CAPILLARY
Glucose-Capillary: 112 mg/dL — ABNORMAL HIGH (ref 70–99)
Glucose-Capillary: 133 mg/dL — ABNORMAL HIGH (ref 70–99)
Glucose-Capillary: 143 mg/dL — ABNORMAL HIGH (ref 70–99)
Glucose-Capillary: 173 mg/dL — ABNORMAL HIGH (ref 70–99)
Glucose-Capillary: 173 mg/dL — ABNORMAL HIGH (ref 70–99)
Glucose-Capillary: 174 mg/dL — ABNORMAL HIGH (ref 70–99)

## 2021-12-02 LAB — CBC
HCT: 35.8 % — ABNORMAL LOW (ref 36.0–46.0)
Hemoglobin: 10.7 g/dL — ABNORMAL LOW (ref 12.0–15.0)
MCH: 28 pg (ref 26.0–34.0)
MCHC: 29.9 g/dL — ABNORMAL LOW (ref 30.0–36.0)
MCV: 93.7 fL (ref 80.0–100.0)
Platelets: 524 10*3/uL — ABNORMAL HIGH (ref 150–400)
RBC: 3.82 MIL/uL — ABNORMAL LOW (ref 3.87–5.11)
RDW: 19.8 % — ABNORMAL HIGH (ref 11.5–15.5)
WBC: 8.6 10*3/uL (ref 4.0–10.5)
nRBC: 0 % (ref 0.0–0.2)

## 2021-12-02 LAB — VANCOMYCIN, TROUGH: Vancomycin Tr: 15 ug/mL (ref 15–20)

## 2021-12-02 MED ORDER — VANCOMYCIN HCL 1500 MG/300ML IV SOLN
1500.0000 mg | INTRAVENOUS | Status: DC
  Administered 2021-12-02 – 2021-12-04 (×3): 1500 mg via INTRAVENOUS
  Filled 2021-12-02 (×3): qty 300

## 2021-12-02 NOTE — Progress Notes (Signed)
RT NOTE: patient placed on CPAP/PSV of 10/8 at 0755.  Currently tolerating well and looks to be more comfortable on wean settings.  Unit RT aware.  Will continue to monitor.  ?

## 2021-12-02 NOTE — Progress Notes (Signed)
[facility]                                                                              Patient Demographics  Kristen Murphy, is a 56 y.o. female, DOB - [birth year], JGM:[id number]  Admit date - 11/12/2021   Admitting Physician Collene Gobble, MD  Outpatient Primary MD for the patient is Gweneth Fritter, FNP  Outpatient specialists:   LOS - 20  days   Medical records reviewed and are as summarized below:    Chief Complaint  Patient presents with   Altered Mental Status       Brief summary   Per admission history by PCCM on 2/19 Patient is a 56 year old female with history of tobacco use, cervical CA, post nephrectomy and colostomy, CAD with LAD stenting 01/2019, diabetes, depression, hypertension, remote CVA, substance use, was brought in by her son to the ED on 2/19 with fever, altered mental status and slurred speech. Apparently, she had a flulike illness for a week, was found confused on 2/19.  There was some suspicion by son that she may have been taking more prescription narcotics, many NSAIDs.  She was disoriented in the ED, febrile 103 F, hyperglycemic.  In ED, she also experienced acute hypoxemia, had some blood from her mouth, emesis versus hemoptysis.  Reported it to PEA and required emergent ED intubation and CPR.  ROSC after 2 minutes, OG tube placed with bright red blood obtained.  Chest x-ray with mild right hilar prominence, more notable on postintubation films, no infiltrates or effusions. Received empiric antibiotics, packed RBC transfusion and admitted to ICU. 2/19 Intubated. Head CT  > no acute CT findings, old left basal ganglia lacunar, old left frontal cortical and subcortical infarct MRI brain 2/21 numerous lesions with mild associated hemorrhage and edema. Neurosurgery reviewed imaging and patient not a good candidate for lesion aspiration. 2/24 MR Brain - Numerous lesions with mild associated hemorrhage. Mild edema. Concerned for brain  abscesses 2/22 LP consistent with bacterial meningitis. Respiratory Cx +MRSA 2/25 MR Liver with no lesions, hypoperfusion to the subcapsular right hepatic lobe, favor benign vascular phenomena, distended gallbladder, persistent region of hypoperfusion. EEG and LTM 2/25 neg for seizures.  Repeat head CT 2/27 with no acute changes  2/28 No acute events overnight, LTM read ? Seizure.  Cefepime switched to meropenem. 3/2 LTM stopped. Meropenem changed to CTX. TEE + mobile mass on MV w/ mild to mod MR EF 60-65% 3/3 change to IV heparin for possible trach  3/4 trach placed 3/5 ? Seizure activity, LTM restarted, weaned 8 hrs  3/6 remains on LTM- no seizure to correlate with episodes of HR 130s, jaw clenching, urinary incontinence.  Linezolid changed to vanc per ID for longterm therapy, weaned x 8 hours, diuresing 3/7 hypoglycemic overnight w/ held TF, PEG placed per IR, off propofol, LTM d/c'd, weaned x 8 hrs 8/8, 50% 3/9: Patient transferred to [facility], assumed care     Assessment & Plan    Principal Problem: Sepsis with brain abscess, MRSA bacteremia, MV endocarditis, pulmonary spread -Prolonged antibiotics per ID.  Remains on ceftriaxone/vancomycin (changed from linezolid 3/6 per ID), plan  for 8 weeks through April 16 or if repeat MRI shows clearance sooner -PICC cannot be placed due to thrombi in both upper extremities, continue peripheral IVs   Active Problems: Acute hypoxemic respiratory failure -Due to necrotizing MRSA pneumonia -CCM will continue to follow for vent support, ongoing trach care -place bite block for the tongue laceration  Acute multifactorial encephalopathy -Secondary to #1, postarrest and sepsis. -Underwent prolonged EEG, stopped 3/7 due to no seizure activity -Neurology following, continue Keppra, Vimpat, gabapentin at current dose.  -Continue seizure precautions -Per neurology, significant neurological injury, minimal chances of meaningful recovery -Palliative  medicine consulted for goals of care  PICC associated upper extremity DVT bilaterally -Continue peripheral IVs, was on IV heparin, transitioned to Eliquis x 3 months  Nutrition -PEG tube placed on 3/7, core track discontinued -Continue tube feeds and medications via PEG tube  Diabetes mellitus, type II -CBG stable, continue SSI  -Continue Semglee 9 units twice daily, NovoLog 12u every 4 hours  -G-tube placed on 3/7 by IR, tube feeds infusing  Code Status: Full code.   DVT Prophylaxis:  SCDs Start: 11/12/21 1839 apixaban (ELIQUIS) tablet 10 mg  apixaban (ELIQUIS) tablet 5 mg   Level of Care: Level of care: ICU Family Communication:  Disposition Plan:     Status is: Inpatient Family wants another 3 weeks to assess for any new improvements if no progression, will consider transition to comfort care.  Time Spent in minutes 25 minutes  Procedures:  Mechanical ventilation PEG tube placement  Consultants:   Neurology Critical care Interventional radiology  Antimicrobials:  IV vancomycin IV ceftriaxone   Medications  Scheduled Meds:  amLODipine  10 mg Per Tube Daily   apixaban  10 mg Per Tube BID   Followed by   Derrill Memo ON 12/06/2021] apixaban  5 mg Per Tube BID   artificial tears   Both Eyes Q8H   chlorhexidine gluconate (MEDLINE KIT)  15 mL Mouth Rinse BID   Chlorhexidine Gluconate Cloth  6 each Topical Daily   docusate  100 mg Per Tube BID   furosemide  40 mg Per Tube BID   gabapentin  300 mg Per Tube Q12H   insulin aspart  0-9 Units Subcutaneous Q4H   insulin aspart  12 Units Subcutaneous Q4H   insulin glargine-yfgn  9 Units Subcutaneous BID   mouth rinse  15 mL Mouth Rinse 10 times per day   metoprolol tartrate  25 mg Per Tube BID   oxyCODONE  5 mg Per Tube Q6H   pantoprazole sodium  40 mg Per Tube Daily   polyethylene glycol  17 g Per Tube Daily   Continuous Infusions:  sodium chloride Stopped (11/21/21 1033)   sodium chloride 20 mL/hr at 11/23/21 1620    sodium chloride Stopped (11/26/21 1648)   cefTRIAXone (ROCEPHIN)  IV Stopped (12/02/21 1058)   feeding supplement (VITAL AF 1.2 CAL) 65 mL/hr at 12/02/21 1400   lacosamide (VIMPAT) IV Stopped (12/02/21 1159)   levETIRAcetam Stopped (12/02/21 1041)   vancomycin Stopped (12/01/21 2328)   PRN Meds:.sodium chloride, acetaminophen **OR** acetaminophen (TYLENOL) oral liquid 160 mg/5 mL **OR** acetaminophen, albuterol, fentaNYL (SUBLIMAZE) injection, fentaNYL (SUBLIMAZE) injection, hydrALAZINE, midazolam, polyethylene glycol      Subjective:   Kristen Murphy was seen and examined today.  Minimally responsive, opens eyes spontaneously however does not track or follow any commands.    Objective:   Vitals:   12/02/21 1100 12/02/21 1117 12/02/21 1300 12/02/21 1400  BP: (!) 149/101  (!) 147/76  133/75  Pulse: 91 84 75 82  Resp: (!) 21 (!) 21 20 (!) 23  Temp:      TempSrc:      SpO2: 94% 91% 97% (!) 87%  Weight:      Height:        Intake/Output Summary (Last 24 hours) at 12/02/2021 1446 Last data filed at 12/02/2021 1400 Gross per 24 hour  Intake 3220.31 ml  Output 2700 ml  Net 520.31 ml     Wt Readings from Last 3 Encounters:  12/02/21 68.8 kg  01/06/21 82 kg  12/07/19 90.3 kg   Physical Exam General: Unresponsive, spontaneously opens eyes but does not track or makes eye contact, rolled upwards.  Trach in place Cardiovascular: S1 S2 clear, RRR. Respiratory: CTAB, no wheezing, rales or rhonchi Gastrointestinal: Soft, PEG tube in place Ext: no pedal edema bilaterally Neuro: does not follow any commands  Data Reviewed:  I have personally reviewed following labs and imaging studies  Radiology Reports CT HEAD WO CONTRAST (5MM)  Result Date: 11/20/2021 IMPRESSION: 1. No change from recent head CT to explain the new deficit. 2. Known numerous brain lesions and subacute right cerebral infarcts. Electronically Signed   By: Jorje Guild M.D.   On: 11/20/2021 10:58   CT HEAD WO  CONTRAST (5MM)  Result Date: 11/19/2021  IMPRESSION: 1. Relatively well-defined focus of hypoattenuation in the superior, medial right frontal lobe and adjacent right parietal lobe, consistent with recent infarction, occurring since the brain MRI dated 11/14/2021. 2. Several small hypoattenuating lesions noted in the basal ganglia on the left, cerebral hemispheres and cerebellar hemispheres, corresponding to the lesions noted on the prior brain MRI. Differential diagnosis consistent with abscesses or metastatic disease. 3. Old left superior frontal lobe infarct stable from the prior head CT. Electronically Signed   By: Lajean Manes M.D.   On: 11/19/2021 14:32   CT HEAD WO CONTRAST (5MM)  IMPRESSION: No acute abnormality noted. No significant change from the prior exam. Electronically Signed   By: Inez Catalina M.D.   On: 11/12/2021 19:14   CT CHEST WO CONTRAST  Result Date: 11/19/2021  IMPRESSION: 1. Since the prior chest CT, numerous small ill-defined ground-glass nodular opacities have developed in both lungs. Multifocal infection is suspected. Findings could be inflammatory. 2. Bilateral lower lobe atelectasis, partly obscuring the right lower lobe cavitary consolidative process noted on the prior CT, which otherwise has not changed. Lung base opacities have significantly increased from the previous CT. New trace left pleural effusion. Small areas of atelectasis or infection noted in the right middle lobe and left upper lobe lingula, also new. 3. Stable coronary artery calcifications and aortic atherosclerosis. 4. Prominent mediastinal lymph nodes increased from the prior CT, reactive. Aortic Atherosclerosis (ICD10-I70.0). Electronically Signed   By: Lajean Manes M.D.   On: 11/19/2021 14:43   CT Angio Chest PE W and/or Wo Contrast  Result Date: 11/12/2021 IMPRESSION: CTA of the chest: No evidence of pulmonary emboli. Large mildly cavitary infiltrate in superior segment of right lower lobe consistent  with acute pneumonia. Some minimal infiltrate is noted in the left base as well. Small bleb with air-fluid level within is noted in the medial aspect of the left base. These findings are all new from the prior exam. Resolution of previously seen effusions. 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 CT of the abdomen and pelvis: Postsurgical changes consistent with colostomy. Area of decreased attenuation and enhancement within the lateral segment of the left  lobe of the liver with a somewhat mass like appearance along the margin of the liver. Possible metastatic disease would deserve consideration. Status post right nephrectomy. Electronically Signed   By: Inez Catalina M.D.   On: 11/12/2021 19:33   MR BRAIN W WO CONTRAST  Result Date: 11/14/2021 IMPRESSION: Numerous lesions in the brain. There are lesions in the cerebellar hemispheres and in both cerebral hemispheres widely distributed. Greater than 50 lesions are identified. The lesions have a rounded appearance with restricted diffusion and mild enhancement. Many lesions show evidence of mild associated hemorrhage. Mild edema. Restricted diffusion in the left lateral ventricle likely due to ventriculitis. 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 Differential diagnosis includes metastatic disease and demyelinating disease. Correlate with lumbar puncture results. These results were called by telephone at the time of interpretation on 11/14/2021 at 4:46 pm to provider CHI ELLISON , who verbally acknowledged these results. Electronically Signed   By: Franchot Gallo M.D.   On: 11/14/2021 16:48   CT ABDOMEN PELVIS W CONTRAST  Result Date: 11/12/2021 IMPRESSION: CTA of the chest: No evidence of pulmonary emboli. Large mildly cavitary infiltrate in superior segment of right lower lobe consistent with acute pneumonia. Some minimal infiltrate is noted in the left base as well. Small bleb with  air-fluid level within is noted in the medial aspect of the left base. These findings are all new from the prior exam. Resolution of previously seen effusions. 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 CT of the abdomen and pelvis: Postsurgical changes consistent with colostomy. Area of decreased attenuation and enhancement within the lateral segment of the left lobe of the liver with a somewhat mass like appearance along the margin of the liver. Possible metastatic disease would deserve consideration. Status post right nephrectomy. Electronically Signed   By: Inez Catalina M.D.   On: 11/12/2021 19:33   IR GASTROSTOMY TUBE MOD SED  Result Date: 11/28/2021 IMPRESSION: Percutaneous gastrostomy with placement of a 24-French bumper retention tube in the body of the stomach. This tube can be used for percutaneous feeds beginning in 24 hours after placement. Electronically Signed   By: Aletta Edouard M.D.   On: 11/28/2021 17:17   MR LIVER W WO CONTRAST  Result Date: 11/18/2021  IMPRESSION: 1. Interval increase in dense atelectasis/consolidation of the RIGHT lower lobe. 2. No worrisome lesions within the liver. Hypoperfusion to the subcapsular RIGHT hepatic lobe is favored benign vascular phenomena. 3. Distended gallbladder may relate to fasting state. 4. Persistent region of hypoperfusion to the spleen suggests splenic infarctions. 5. Solitary LEFT kidney without complication. Electronically Signed   By: Suzy Bouchard M.D.   On: 11/18/2021 20:06   DG Chest Port 1 View  Result Date: 11/27/2021 IMPRESSION: 1. Question trace left pleural effusion. Lateral radiograph could further evaluate if clinically indicated. 2. Streaky left basilar opacity which could represent atelectasis, aspiration, and/or pneumonia. 3. Similar prominence of the right hilum with findings in this region better characterized on recent chest CT. Electronically Signed   By: Margaretha Sheffield M.D.   On: 11/27/2021 07:56   EEG  adult  Result Date: 11/26/2021 This EEG was obtained while sedated on propofol and is abnormal due to severe diffuse slowing indicative of global cerebral dysfunction, medication effect, or both. No epileptiform abnormalities were seen during this recording. Su Monks, MD [facility] [phone] If 7pm- 7am, please page neurology on call as listed in [place].  Overnight EEG with video  Result Date: 11/27/2021  IMPRESSION: This technically difficult study is suggestive of moderate to severe diffuse encephalopathy, nonspecific etiology.  No seizures or definite epileptiform discharges were seen during the study. Patient event button was pressed on 11/26/2021 at 2149 for unclear reasons without concomitant EEG change.  This was most likely not an epileptic event Patient event button was pressed on 11/26/2020 at 2253 for several whole-body tremors, tachycardia with heart rate of 130 and urinary incontinence.  No concomitant EEG changes seem to be in this episode.  This was most likely not an epileptic event Multiple episodes of eye rolling backwards recording during the study without concomitant EEG change.  These episodes are most likely not epileptic Lora Havens    ECHOCARDIOGRAM COMPLETE  Result Date: 11/13/2021 IMPRESSIONS  1. Left ventricular ejection fraction, by estimation, is 65 to 70%. The left ventricle has normal function. The left ventricle has no regional wall motion abnormalities. Left ventricular diastolic parameters are consistent with Grade I diastolic dysfunction (impaired relaxation).  2. Right ventricular systolic function is normal. The right ventricular size is normal.  3. The mitral valve is normal in structure. No evidence of mitral valve regurgitation. Mild to moderate mitral stenosis.  4. The aortic valve is normal in structure. Aortic valve regurgitation is not visualized. Mild aortic valve stenosis. FINDINGS  Left Ventricle: Left ventricular ejection fraction, by  estimation, is 65 to 70%. The left ventricle has normal function. The left ventricle has no regional wall motion abnormalities.   ECHO TEE  Result Date: 11/23/2021 IMPRESSIONS  1. Study terminated early as patient became hypoxic on the vent.  2. The mitral valve is thickened and mildly calcified. Leaflet motion is mildly restricted without significant stenosis (mean gradient 28mHg at HR 70bpm). There are mobile elements visualized on the anterior mitral valve leaflet which could represent possible vegetations given clinical picture although may also be redundant mitral valve tissue vs prior endocarditis. There is mild-to-moderate mitral regurgitation.  3. Left ventricular ejection fraction, by estimation, is 60 to 65%. The left ventricle has normal function.  4. Right ventricular systolic function is normal. The right ventricular size is normal.  5. No left atrial/left atrial appendage thrombus was detected.  6. The aortic valve is tricuspid. There is mild thickening of the aortic valve. Aortic valve regurgitation is not visualized. Aortic valve sclerosis is present, with no evidence of aortic valve stenosis.  7. No other valvular vegetations visualized.  VAS [place]PPER EXTREMITY VENOUS DUPLEX  Result Date: 11/21/2021 UPPER VENOUS STUDY  Summary:  Right: Findings consistent with acute deep vein thrombosis involving the right brachial veins surrounding the PICC line.  Left: No evidence of deep vein thrombosis in the upper extremity. Findings consistent with acute superficial vein thrombosis involving the left cephalic vein.  *See table(s) above for measurements and observations.  Diagnosing physician: CDeitra MayoMD Electronically signed by CDeitra MayoMD on 11/21/2021 at 6:17:06 PM.    Final    Lab Data:  CBC: Recent Labs  Lab 11/28/21 0128 11/29/21 0157 11/30/21 0133 12/01/21 0108 12/02/21 0135  WBC 8.6 10.2 8.8 8.1 8.6  HGB 9.9* 9.8* 10.0* 9.9* 10.7*  HCT 32.7* 33.3* 33.0* 32.8*  35.8*  MCV 93.2 94.3 92.4 93.4 93.7  PLT 388 457* 500* 529* 5546   Basic Metabolic Panel: Recent Labs  Lab 11/28/21 0128 11/29/21 0157 11/29/21 0905 11/30/21 0133 12/01/21 0108 12/02/21 0135  NA 138 133* 134* 137 135 134*  K 4.0 4.6  --  3.5 3.6 4.2  CL 99 97*  --  99 100 100  CO2 29 26  --  '28 23 22  ' GLUCOSE 88 160*  --  125* 170* 164*  BUN 26* 22*  --  22* 25* 28*  CREATININE 0.63 0.67  --  0.72 0.68 0.67  CALCIUM 8.6* 8.6*  --  8.9 8.6* 9.0  MG 2.3 2.1  --   --   --   --   PHOS 3.1  --   --   --   --   --      CBG: Recent Labs  Lab 12/01/21 1938 12/01/21 2340 12/02/21 0345 12/02/21 0751 12/02/21 [place]    Josecarlos Harriott M.D. [facility] 12/02/2021, 2:46 PM  Available via Epic secure chat 7am-7pm After 7 pm, please refer to night coverage provider listed on amion.

## 2021-12-02 NOTE — Progress Notes (Signed)
Pharmacy Antibiotic Note ? ?KAMALEI ROEDER is a 56 y.o. female admitted on 11/12/2021 with multiple brain abscesses.  Pharmacy has been consulted for vancomycin dosing. ? ?Vancomycin trough 15 mcg/ml, however true trough likely closer to 13 (drawn ~2h early). ? ?Plan: ?Increase vancomycin to 154m IV q24h ?Will adjust OPAT orders ? ? ?Temp (24hrs), Avg:98.6 ?F (37 ?C), Min:98.2 ?F (36.8 ?C), Max:98.8 ?F (37.1 ?C) ? ?Recent Labs  ?Lab 11/28/21 ?0128 11/29/21 ?0157 11/29/21 ?[id number]/09/23 ?0133 12/01/21 ?[id number]/11/23 ?0135 12/02/21 ?2019  ?WBC 8.6 10.2  --  8.8 8.1 8.6  --   ?CREATININE 0.63 0.67  --  0.72 0.68 0.67  --   ?VSpencer --   --  216  --   --   --  15  ? ?  ?Estimated Creatinine Clearance: 71.4 mL/min (by C-G formula based on SCr of 0.67 mg/dL).   ? ?Allergies  ?Allergen Reactions  ? Glimepiride Anaphylaxis  ?  Has sulfa in it per pharmacy  ? Sulfa Antibiotics Anaphylaxis and Hives  ? Effexor [Venlafaxine] Hives  ? ? ?Antimicrobials this admission: ? ?Rocephin 2/19 >> 2/21, 3/2> [4/16] ?Vancomycin 2/19 >> 2/22, 3/6 >> [4/16] ?  3/8 VT: 27; calculated Ke 0.569, half-life 12h >> new dosing interval of 24h ?Acyclovir 2/19 >> 2/21 ?Cefepime 2/21 >> 2/27 ?Flagyl 2/21 >> 2/27 ?Linezolid 2/22 > 3/6 ?Meropenem 2/27 >> 3/2 ? ?Dose adjustments this admission: ?Vancomycin 1000 mg Q12H >> 1250 mg Q24H ? ?Microbiology results: ?2/22 CSF: neg ?2/19 BCx: neg ?2/19 Ucx pseudomonas pan sens, enterobacter cefaz -R, S -to all others ?2/20 BAL > few MRSA ?2/19 MRSA PCR > detected  ? ?Thank you for allowing pharmacy to be a part of this patient?s care. ? ? ?MArrie Senate PharmD, BCPS, BCCP ?Clinical Pharmacist ?[phone]?Please check AMION for all MBurkenumbers ?12/02/2021 ? ? ?

## 2021-12-03 ENCOUNTER — Inpatient Hospital Stay (HOSPITAL_COMMUNITY): Payer: Medicaid Other

## 2021-12-03 DIAGNOSIS — E119 Type 2 diabetes mellitus without complications: Secondary | ICD-10-CM | POA: Diagnosis not present

## 2021-12-03 DIAGNOSIS — C530 Malignant neoplasm of endocervix: Secondary | ICD-10-CM | POA: Diagnosis not present

## 2021-12-03 DIAGNOSIS — Z7189 Other specified counseling: Secondary | ICD-10-CM | POA: Diagnosis not present

## 2021-12-03 DIAGNOSIS — G060 Intracranial abscess and granuloma: Secondary | ICD-10-CM | POA: Diagnosis not present

## 2021-12-03 DIAGNOSIS — Z4659 Encounter for fitting and adjustment of other gastrointestinal appliance and device: Secondary | ICD-10-CM

## 2021-12-03 DIAGNOSIS — I469 Cardiac arrest, cause unspecified: Secondary | ICD-10-CM | POA: Diagnosis not present

## 2021-12-03 DIAGNOSIS — Z515 Encounter for palliative care: Secondary | ICD-10-CM

## 2021-12-03 LAB — GLUCOSE, CAPILLARY
Glucose-Capillary: 109 mg/dL — ABNORMAL HIGH (ref 70–99)
Glucose-Capillary: 130 mg/dL — ABNORMAL HIGH (ref 70–99)
Glucose-Capillary: 132 mg/dL — ABNORMAL HIGH (ref 70–99)
Glucose-Capillary: 177 mg/dL — ABNORMAL HIGH (ref 70–99)
Glucose-Capillary: 186 mg/dL — ABNORMAL HIGH (ref 70–99)
Glucose-Capillary: 201 mg/dL — ABNORMAL HIGH (ref 70–99)
Glucose-Capillary: 214 mg/dL — ABNORMAL HIGH (ref 70–99)
Glucose-Capillary: 80 mg/dL (ref 70–99)

## 2021-12-03 LAB — BASIC METABOLIC PANEL
Anion gap: 13 (ref 5–15)
BUN: 30 mg/dL — ABNORMAL HIGH (ref 6–20)
CO2: 24 mmol/L (ref 22–32)
Calcium: 8.8 mg/dL — ABNORMAL LOW (ref 8.9–10.3)
Chloride: 95 mmol/L — ABNORMAL LOW (ref 98–111)
Creatinine, Ser: 0.66 mg/dL (ref 0.44–1.00)
GFR, Estimated: >60 mL/min (ref >60)
Glucose, Bld: 121 mg/dL — ABNORMAL HIGH (ref 70–99)
Potassium: 3.9 mmol/L (ref 3.5–5.1)
Sodium: 132 mmol/L — ABNORMAL LOW (ref 135–145)

## 2021-12-03 LAB — CBC
HCT: 35.5 % — ABNORMAL LOW (ref 36.0–46.0)
Hemoglobin: 11.1 g/dL — ABNORMAL LOW (ref 12.0–15.0)
MCH: 29.2 pg (ref 26.0–34.0)
MCHC: 31.3 g/dL (ref 30.0–36.0)
MCV: 93.4 fL (ref 80.0–100.0)
Platelets: 515 10*3/uL — ABNORMAL HIGH (ref 150–400)
RBC: 3.8 MIL/uL — ABNORMAL LOW (ref 3.87–5.11)
RDW: 20 % — ABNORMAL HIGH (ref 11.5–15.5)
WBC: 9.2 10*3/uL (ref 4.0–10.5)
nRBC: 0 % (ref 0.0–0.2)

## 2021-12-03 MED ORDER — METOPROLOL TARTRATE 25 MG/10 ML ORAL SUSPENSION
25.0000 mg | Freq: Two times a day (BID) | ORAL | Status: DC
  Administered 2021-12-03 – 2021-12-05 (×5): 25 mg
  Filled 2021-12-03 (×5): qty 10

## 2021-12-03 MED ORDER — GADOBUTROL 1 MMOL/ML IV SOLN
6.0000 mL | Freq: Once | INTRAVENOUS | Status: AC | PRN
  Administered 2021-12-03: 6 mL via INTRAVENOUS

## 2021-12-03 MED ORDER — AMLODIPINE BESYLATE 5 MG PO TABS
5.0000 mg | ORAL_TABLET | Freq: Every day | ORAL | Status: DC
  Administered 2021-12-03 – 2021-12-05 (×3): 5 mg
  Filled 2021-12-03 (×3): qty 1

## 2021-12-03 MED ORDER — POTASSIUM CHLORIDE 20 MEQ PO PACK
20.0000 meq | PACK | Freq: Once | ORAL | Status: AC
  Administered 2021-12-03: 20 meq
  Filled 2021-12-03: qty 1

## 2021-12-03 NOTE — Progress Notes (Addendum)
[facility]                                                                              Patient Demographics  Kristen Murphy, is a 56 y.o. female, DOB - 30-Sep-1965, XMI:[id number]  Admit date - 11/12/2021   Admitting Physician Collene Gobble, MD  Outpatient Primary MD for the patient is Gweneth Fritter, FNP  Outpatient specialists:   LOS - 21  days   Medical records reviewed and are as summarized below:    Chief Complaint  Patient presents with   Altered Mental Status       Brief summary   Per admission history by PCCM on 2/19 Patient is a 55 year old female with history of tobacco use, cervical CA, post nephrectomy and colostomy, CAD with LAD stenting 01/2019, diabetes, depression, hypertension, remote CVA, substance use, was brought in by her son to the ED on 2/19 with fever, altered mental status and slurred speech. Apparently, she had a flulike illness for a week, was found confused on 2/19.  There was some suspicion by son that she may have been taking more prescription narcotics, many NSAIDs.  She was disoriented in the ED, febrile 103 F, hyperglycemic.  In ED, she also experienced acute hypoxemia, had some blood from her mouth, emesis versus hemoptysis.  Reported it to PEA and required emergent ED intubation and CPR.  ROSC after 2 minutes, OG tube placed with bright red blood obtained.  Chest x-ray with mild right hilar prominence, more notable on postintubation films, no infiltrates or effusions. Received empiric antibiotics, packed RBC transfusion and admitted to ICU. 2/19 Intubated. Head CT  > no acute CT findings, old left basal ganglia lacunar, old left frontal cortical and subcortical infarct MRI brain 2/21 numerous lesions with mild associated hemorrhage and edema. Neurosurgery reviewed imaging and patient not a good candidate for lesion aspiration. 2/24 MR Brain - Numerous lesions with mild associated hemorrhage. Mild edema. Concerned for brain  abscesses 2/22 LP consistent with bacterial meningitis. Respiratory Cx +MRSA 2/25 MR Liver with no lesions, hypoperfusion to the subcapsular right hepatic lobe, favor benign vascular phenomena, distended gallbladder, persistent region of hypoperfusion. EEG and LTM 2/25 neg for seizures.  Repeat head CT 2/27 with no acute changes  2/28 No acute events overnight, LTM read ? Seizure.  Cefepime switched to meropenem. 3/2 LTM stopped. Meropenem changed to CTX. TEE + mobile mass on MV w/ mild to mod MR EF 60-65% 3/3 change to IV heparin for possible trach  3/4 trach placed 3/5 ? Seizure activity, LTM restarted, weaned 8 hrs  3/6 remains on LTM- no seizure to correlate with episodes of HR 130s, jaw clenching, urinary incontinence.  Linezolid changed to vanc per ID for longterm therapy, weaned x 8 hours, diuresing 3/7 hypoglycemic overnight w/ held TF, PEG placed per IR, off propofol, LTM d/c'd, weaned x 8 hrs 8/8, 50% 3/9: Patient transferred to [facility], assumed care     Assessment & Plan    Principal Problem: Sepsis with brain abscess, MRSA bacteremia, MV endocarditis, pulmonary spread -Prolonged antibiotics per ID.  Remains on ceftriaxone/vancomycin (changed from linezolid 3/6 per ID), plan  for 8 weeks through April 16 or if repeat MRI shows clearance sooner -PICC cannot be placed due to thrombi in both upper extremities, continue peripheral IVs   Active Problems: Acute hypoxemic respiratory failure -Due to necrotizing MRSA pneumonia -CCM will continue to follow for vent support, ongoing trach care -place bite block for the tongue laceration  Acute multifactorial encephalopathy -Secondary to #1, postarrest and sepsis. -Underwent prolonged EEG, stopped 3/7 due to no seizure activity -Neurology following, continue Keppra, Vimpat, gabapentin at current dose.  -Continue seizure precautions -Per neurology, significant neurological injury, minimal chances of meaningful recovery -Still full code,  no meaningful recovery so far, palliative medicine consulted for goals of care  PICC associated upper extremity DVT bilaterally -Continue peripheral IVs, was on IV heparin, transitioned to Eliquis x 3 months  Nutrition -PEG tube placed on 3/7, core track discontinued -Continue tube feeds and medications via PEG tube  Diabetes mellitus, type II -G-tube placed on 3/7 by IR, tube feeds infusing -CBGs on lower side, will DC scheduled NovoLog, continue Semglee and sliding scale insulin  Essential hypertension -BP has been running soft, DC amlodipine, continue metoprolol  GOALS OF CARE: Discussed with patient's daughter, Kristen Murphy (patient's son also on the phone) in detail.  Reviewed previous care in the ICU, recommendations by CCM, infectious disease, neurology.  Overall, patient has not made significant or meaningful neurological recovery in the last couple of days I have seen her.  Family is considering making decisions regarding transitioning however would like to see if any changes in the brain abscesses and neuro input.  Discussed with Dr. Linus Salmons, ID, recommended MRI with contrast to assess  improvement in the brain abscesses with antibiotics.  I will request neurology to follow-up in a.m. once imaging is completed for their input on any neurological recovery.  Palliative medicine consulted for addressing goals of care.    Code Status: Full code.   DVT Prophylaxis:  SCDs Start: 11/12/21 1839 apixaban (ELIQUIS) tablet 10 mg  apixaban (ELIQUIS) tablet 5 mg   Level of Care: Level of care: ICU Family Communication: Discussed with patient's daughter Disposition Plan:     Status is: Inpatient  Procedures:  Mechanical ventilation PEG tube placement  Consultants:   Neurology Critical care Interventional radiology  Antimicrobials:  IV vancomycin IV ceftriaxone   Medications  Scheduled Meds:  apixaban  10 mg Per Tube BID   Followed by   Derrill Memo ON 12/06/2021] apixaban  5  mg Per Tube BID   artificial tears   Both Eyes Q8H   chlorhexidine gluconate (MEDLINE KIT)  15 mL Mouth Rinse BID   Chlorhexidine Gluconate Cloth  6 each Topical Daily   docusate  100 mg Per Tube BID   furosemide  40 mg Per Tube BID   gabapentin  300 mg Per Tube Q12H   insulin aspart  0-9 Units Subcutaneous Q4H   insulin glargine-yfgn  9 Units Subcutaneous BID   mouth rinse  15 mL Mouth Rinse 10 times per day   metoprolol tartrate  25 mg Per Tube BID   oxyCODONE  5 mg Per Tube Q6H   pantoprazole sodium  40 mg Per Tube Daily   polyethylene glycol  17 g Per Tube Daily   Continuous Infusions:  sodium chloride Stopped (11/21/21 1033)   sodium chloride 20 mL/hr at 11/23/21 1620   sodium chloride Stopped (11/26/21 1648)   cefTRIAXone (ROCEPHIN)  IV 2 g (12/03/21 1006)   feeding supplement (VITAL AF 1.2 CAL) 1,000 mL (12/03/21  0350)   lacosamide (VIMPAT) IV 100 mg (12/03/21 1018)   levETIRAcetam 1,500 mg (12/03/21 1002)   vancomycin Stopped (12/03/21 0005)    Subjective:   Kristen Murphy was seen and examined today.  No change from yesterday, unresponsive, does not follow any commands.  BP, CBGs have been running low  Objective:   Vitals:   12/03/21 0800 12/03/21 0900 12/03/21 1000 12/03/21 1003  BP: 116/68 112/67 101/60 101/60  Pulse: 96 98 96 96  Resp: 20 (!) 22 (!) 23   Temp:      TempSrc:      SpO2: 91% 95% 95%   Weight:      Height:        Intake/Output Summary (Last 24 hours) at 12/03/2021 1103 Last data filed at 12/03/2021 0600 Gross per 24 hour  Intake 2290.22 ml  Output 3025 ml  Net -734.78 ml     Wt Readings from Last 3 Encounters:  12/03/21 68.6 kg  01/06/21 82 kg  12/07/19 90.3 kg   Physical Exam General: Unresponsive, appears comfortable, trach in place Cardiovascular: S1 S2 clear, RRR. No pedal edema b/l Respiratory: CTAB, no wheezing, rales or rhonchi Gastrointestinal: Soft, NT, ND, PEG tube in place Ext: no pedal edema bilaterally Neuro: does not  follow commands  Data Reviewed:  I have personally reviewed following labs and imaging studies  Radiology Reports CT HEAD WO CONTRAST (5MM)  Result Date: 11/20/2021 IMPRESSION: 1. No change from recent head CT to explain the new deficit. 2. Known numerous brain lesions and subacute right cerebral infarcts. Electronically Signed   By: Jorje Guild M.D.   On: 11/20/2021 10:58   CT HEAD WO CONTRAST (5MM)  Result Date: 11/19/2021  IMPRESSION: 1. Relatively well-defined focus of hypoattenuation in the superior, medial right frontal lobe and adjacent right parietal lobe, consistent with recent infarction, occurring since the brain MRI dated 11/14/2021. 2. Several small hypoattenuating lesions noted in the basal ganglia on the left, cerebral hemispheres and cerebellar hemispheres, corresponding to the lesions noted on the prior brain MRI. Differential diagnosis consistent with abscesses or metastatic disease. 3. Old left superior frontal lobe infarct stable from the prior head CT. Electronically Signed   By: Lajean Manes M.D.   On: 11/19/2021 14:32   CT HEAD WO CONTRAST (5MM)  IMPRESSION: No acute abnormality noted. No significant change from the prior exam. Electronically Signed   By: Inez Catalina M.D.   On: 11/12/2021 19:14   CT CHEST WO CONTRAST  Result Date: 11/19/2021  IMPRESSION: 1. Since the prior chest CT, numerous small ill-defined ground-glass nodular opacities have developed in both lungs. Multifocal infection is suspected. Findings could be inflammatory. 2. Bilateral lower lobe atelectasis, partly obscuring the right lower lobe cavitary consolidative process noted on the prior CT, which otherwise has not changed. Lung base opacities have significantly increased from the previous CT. New trace left pleural effusion. Small areas of atelectasis or infection noted in the right middle lobe and left upper lobe lingula, also new. 3. Stable coronary artery calcifications and aortic  atherosclerosis. 4. Prominent mediastinal lymph nodes increased from the prior CT, reactive. Aortic Atherosclerosis (ICD10-I70.0). Electronically Signed   By: Lajean Manes M.D.   On: 11/19/2021 14:43   CT Angio Chest PE W and/or Wo Contrast  Result Date: 11/12/2021 IMPRESSION: CTA of the chest: No evidence of pulmonary emboli. Large mildly cavitary infiltrate in superior segment of right lower lobe consistent with acute pneumonia. Some minimal infiltrate is noted in the left  base as well. Small bleb with air-fluid level within is noted in the medial aspect of the left base. These findings are all new from the prior exam. Resolution of previously seen effusions. 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 CT of the abdomen and pelvis: Postsurgical changes consistent with colostomy. Area of decreased attenuation and enhancement within the lateral segment of the left lobe of the liver with a somewhat mass like appearance along the margin of the liver. Possible metastatic disease would deserve consideration. Status post right nephrectomy. Electronically Signed   By: Inez Catalina M.D.   On: 11/12/2021 19:33   MR BRAIN W WO CONTRAST  Result Date: 11/14/2021 IMPRESSION: Numerous lesions in the brain. There are lesions in the cerebellar hemispheres and in both cerebral hemispheres widely distributed. Greater than 50 lesions are identified. The lesions have a rounded appearance with restricted diffusion and mild enhancement. Many lesions show evidence of mild associated hemorrhage. Mild edema. Restricted diffusion in the left lateral ventricle likely due to ventriculitis. 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 Differential diagnosis includes metastatic disease and demyelinating disease. Correlate with lumbar puncture results. These results were called by telephone at the time of interpretation on 11/14/2021 at 4:46 pm to provider CHI ELLISON , who  verbally acknowledged these results. Electronically Signed   By: Franchot Gallo M.D.   On: 11/14/2021 16:48   CT ABDOMEN PELVIS W CONTRAST  Result Date: 11/12/2021 IMPRESSION: CTA of the chest: No evidence of pulmonary emboli. Large mildly cavitary infiltrate in superior segment of right lower lobe consistent with acute pneumonia. Some minimal infiltrate is noted in the left base as well. Small bleb with air-fluid level within is noted in the medial aspect of the left base. These findings are all new from the prior exam. Resolution of previously seen effusions. 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 CT of the abdomen and pelvis: Postsurgical changes consistent with colostomy. Area of decreased attenuation and enhancement within the lateral segment of the left lobe of the liver with a somewhat mass like appearance along the margin of the liver. Possible metastatic disease would deserve consideration. Status post right nephrectomy. Electronically Signed   By: Inez Catalina M.D.   On: 11/12/2021 19:33   IR GASTROSTOMY TUBE MOD SED  Result Date: 11/28/2021 IMPRESSION: Percutaneous gastrostomy with placement of a 24-French bumper retention tube in the body of the stomach. This tube can be used for percutaneous feeds beginning in 24 hours after placement. Electronically Signed   By: Aletta Edouard M.D.   On: 11/28/2021 17:17   MR LIVER W WO CONTRAST  Result Date: 11/18/2021  IMPRESSION: 1. Interval increase in dense atelectasis/consolidation of the RIGHT lower lobe. 2. No worrisome lesions within the liver. Hypoperfusion to the subcapsular RIGHT hepatic lobe is favored benign vascular phenomena. 3. Distended gallbladder may relate to fasting state. 4. Persistent region of hypoperfusion to the spleen suggests splenic infarctions. 5. Solitary LEFT kidney without complication. Electronically Signed   By: Suzy Bouchard M.D.   On: 11/18/2021 20:06   DG Chest Port 1 View  Result Date:  11/27/2021 IMPRESSION: 1. Question trace left pleural effusion. Lateral radiograph could further evaluate if clinically indicated. 2. Streaky left basilar opacity which could represent atelectasis, aspiration, and/or pneumonia. 3. Similar prominence of the right hilum with findings in this region better characterized on recent chest CT. Electronically Signed   By: Jamesetta So.D.  On: 11/27/2021 07:56   EEG adult  Result Date: 11/26/2021 This EEG was obtained while sedated on propofol and is abnormal due to severe diffuse slowing indicative of global cerebral dysfunction, medication effect, or both. No epileptiform abnormalities were seen during this recording. Su Monks, MD [facility] [phone] If 7pm- 7am, please page neurology on call as listed in [place].   Overnight EEG with video  Result Date: 11/27/2021  IMPRESSION: This technically difficult study is suggestive of moderate to severe diffuse encephalopathy, nonspecific etiology.  No seizures or definite epileptiform discharges were seen during the study. Patient event button was pressed on 11/26/2021 at 2149 for unclear reasons without concomitant EEG change.  This was most likely not an epileptic event Patient event button was pressed on 11/26/2020 at 2253 for several whole-body tremors, tachycardia with heart rate of 130 and urinary incontinence.  No concomitant EEG changes seem to be in this episode.  This was most likely not an epileptic event Multiple episodes of eye rolling backwards recording during the study without concomitant EEG change.  These episodes are most likely not epileptic Lora Havens    ECHOCARDIOGRAM COMPLETE  Result Date: 11/13/2021 IMPRESSIONS  1. Left ventricular ejection fraction, by estimation, is 65 to 70%. The left ventricle has normal function. The left ventricle has no regional wall motion abnormalities. Left ventricular diastolic parameters are consistent with Grade I diastolic dysfunction  (impaired relaxation).  2. Right ventricular systolic function is normal. The right ventricular size is normal.  3. The mitral valve is normal in structure. No evidence of mitral valve regurgitation. Mild to moderate mitral stenosis.  4. The aortic valve is normal in structure. Aortic valve regurgitation is not visualized. Mild aortic valve stenosis. FINDINGS  Left Ventricle: Left ventricular ejection fraction, by estimation, is 65 to 70%. The left ventricle has normal function. The left ventricle has no regional wall motion abnormalities.   ECHO TEE  Result Date: 11/23/2021 IMPRESSIONS  1. Study terminated early as patient became hypoxic on the vent.  2. The mitral valve is thickened and mildly calcified. Leaflet motion is mildly restricted without significant stenosis (mean gradient 15mHg at HR 70bpm). There are mobile elements visualized on the anterior mitral valve leaflet which could represent possible vegetations given clinical picture although may also be redundant mitral valve tissue vs prior endocarditis. There is mild-to-moderate mitral regurgitation.  3. Left ventricular ejection fraction, by estimation, is 60 to 65%. The left ventricle has normal function.  4. Right ventricular systolic function is normal. The right ventricular size is normal.  5. No left atrial/left atrial appendage thrombus was detected.  6. The aortic valve is tricuspid. There is mild thickening of the aortic valve. Aortic valve regurgitation is not visualized. Aortic valve sclerosis is present, with no evidence of aortic valve stenosis.  7. No other valvular vegetations visualized.  VAS [place]ER EXTREMITY VENOUS DUPLEX  Result Date: 11/21/2021 UPPER VENOUS STUDY  Summary:  Right: Findings consistent with acute deep vein thrombosis involving the right brachial veins surrounding the PICC line.  Left: No evidence of deep vein thrombosis in the upper extremity. Findings consistent with acute superficial vein thrombosis involving  the left cephalic vein.  *See table(s) above for measurements and observations.  Diagnosing physician: CDeitra MayoMD Electronically signed by CDeitra MayoMD on 11/21/2021 at 6:17:06 PM.    Final    Lab Data:  CBC: Recent Labs  Lab 11/29/21 0157 11/30/21 0133 12/01/21 0108 12/02/21 0135 12/02/21 2350  WBC 10.2 8.8  8.1 8.6 9.2  HGB 9.8* 10.0* 9.9* 10.7* 11.1*  HCT 33.3* 33.0* 32.8* 35.8* 35.5*  MCV 94.3 92.4 93.4 93.7 93.4  PLT 457* 500* 529* 524* 888*   Basic Metabolic Panel: Recent Labs  Lab 11/28/21 0128 11/29/21 0157 11/29/21 0905 11/30/21 0133 12/01/21 0108 12/02/21 0135 12/02/21 2350  NA 138 133* 134* 137 135 134* 132*  K 4.0 4.6  --  3.5 3.6 4.2 3.9  CL 99 97*  --  99 100 100 95*  CO2 29 26  --  '28 23 22 24  ' GLUCOSE 88 160*  --  125* 170* 164* 121*  BUN 26* 22*  --  22* 25* 28* 30*  CREATININE 0.63 0.67  --  0.72 0.68 0.67 0.66  CALCIUM 8.6* 8.6*  --  8.9 8.6* 9.0 8.8*  MG 2.3 2.1  --   --   --   --   --   PHOS 3.1  --   --   --   --   --   --      CBG: Recent Labs  Lab 12/02/21 1946 12/02/21 2338 12/03/21 0319 12/03/21 0721 12/03/21 0858  GLUCAP 173* 132* 109* 27 130*    Eller Sweis M.D. [facility] 12/03/2021, 11:03 AM  Available via Epic secure chat 7am-7pm After 7 pm, please refer to night coverage provider listed on amion.

## 2021-12-03 NOTE — Progress Notes (Signed)
Patient transported to MRI & back on the ventilator with no problems. ?

## 2021-12-03 NOTE — Consult Note (Addendum)
?Palliative Medicine Inpatient Consult Note ? ?Consulting Provider: Mendel Corning, MD ? ?Reason for consult:   ?[facility] Consult  ?Reason for Consult? 56 year old female, PEA arrest, MRSA pneumonia, brain abscesses, endocarditis status post trach and PEG, unresponsive.  Needs goals of care  ? ?HPI:  ?Per intake H&P --> Patient is a 56 year old female with history of tobacco use, cervical CA, post nephrectomy and colostomy, CAD with LAD stenting 01/2019, diabetes, depression, hypertension, remote CVA, substance use, was brought in by her son to the ED on 2/19 with fever, altered mental status and slurred speech. Complicated hospitalization in the setting of a brain abscess. (+) Trach and PEG. ? ?Palliative care consulted for goals of care conversations given patients poor prognosis.  ? ?Clinical Assessment/Goals of Care: ? ?*Please note that this is a verbal dictation therefore any spelling or grammatical errors are due to the "[place]" system interpretation. ? ?I have reviewed medical records including EPIC notes, labs and imaging, received report from bedside RN, assessed the patient who is lying in bed in NAD.  ?  ?I met called patients daughter to further discuss diagnosis prognosis, GOC, EOL wishes, disposition and options. ?  ?I introduced Palliative Medicine as specialized medical care for people living with serious illness. It focuses on providing relief from the symptoms and stress of a serious illness. The goal is to improve quality of life for both the patient and the family. ? ?Medical History Review and Understanding: ? ?Discussed patients history of HTN, DM, depression, prior large CVA. ? ?Reviewed Lida's prolonged hospitalization - 21 days. Discussed the various complications associated with her hospitalization inclusive of  ? ?Social History: ? ?Joan lives in [place], [place]. She has two children a daughter, [name] and a son,  [name]. She has not worked due to her prior stroke and has since been on disability. She gets joy out of spending time with her grandchildren. ? ?Functional and Nutritional State: ? ?Tonae was fully independent prior to hospitalization. She had a robust appetite.  ? ?Advance Directives: ? ?A detailed discussion was had today regarding advanced directives.  Patient does not have these on file. Per her daughter, [name] both she and her brother [name] make decisions on their mothers behalf.  ? ?Code Status: ? ?[name] shares that Neoma Laming was full code when she came in though she agrees with readdressing this as she at this point feels differently.  ? ?Discussion: ? ?Ahliyah and I discussed her mothers complicated health course. Reviewed that she has been through a tremendous amount in the past 21 days. [name] shares that she and her brother of emotionally exhausted. ? ?[name] expresses that she has not spoke to a physician in day and is struggling to identify what to do moving forward. She expresses that some providers have recommended waiting 6 weeks to see if she improves while others recommend keeping her comfortable. We reviewed how troubling it is to not know which direction to travel. ? ?[name] shares as an ICU RB she knows what needs to be done but was hoping for additional information such as a repeat CT scan of Nathalia's brain to make the decisions easier.  Discussed her very notable decline in health, various infections processes, and poor likely outcome from all of this. [name] understands this but would like some time to speak to her brother and the medical team prior to additional decision making.  ? ?Discussed the importance of continued conversation with family and their  medical providers regarding overall plan of care and treatment options, ensuring decisions are within the context of the patients values and GOCs. ? ?Decision Maker: ?[name] (Daughter) - [phone] ? ?SUMMARY OF  RECOMMENDATIONS   ?Full Code/Full Scope of Care --> Daughter and son reconsidering this ? ?Patients daughter, [name] requests a medical update from the primary medical team. - Dr. Tana Coast informed ? ?Have also requested an update from CCM and neurology to better aid in decision making ? ?Question of repeat CT scan to see if any changes on brain imaging ? ?Ongoing conversations given likely poor prognosis ? ?Code Status/Advance Care Planning: ?FULL CODE ? ?Palliative Prophylaxis:  ?Aspiration, Bowel Regimen, Delirium Protocol, Frequent Pain Assessment, Oral Care, Palliative Wound Care, and Turn Reposition ? ?Additional Recommendations (Limitations, Scope, Preferences): ?Full Scope Treatment ? ?Psycho-social/Spiritual:  ?Desire for further Chaplaincy support: Yes ?Additional Recommendations: Education on acute disease processes and prolonged hospitalization.  ?  ?Prognosis: Very poor. Without supportive measures time would be quite limited. ? ?Discharge Planning: Unclear presently.  ? ?Vitals:  ? 12/03/21 1000 12/03/21 1003  ?BP: 101/60 101/60  ?Pulse: 96 96  ?Resp: (!) 23   ?Temp:    ?SpO2: 95%   ? ? ?Intake/Output Summary (Last 24 hours) at 12/03/2021 1111 ?Last data filed at 12/03/2021 0600 ?Gross per 24 hour  ?Intake 2290.22 ml  ?Output 3025 ml  ?Net -734.78 ml  ? ?Last Weight  Most recent update: 12/03/2021  4:27 AM  ? ? Weight  ?68.6 kg (151 lb 3.8 oz)  ?      ? ?  ? ?Gen:  Middle aged woman chronically ill in appearance ?HEENT: moist mucous membranes, Trach in place ?CV: Regular rate and rhythm  ?PULM: On ventilator ?ABD: soft/nontender  ?EXT: No edema  ?Neuro: Somnolent slowly opens eyes though does not follow any commands ? ?PPS: 10% ? ? ?This conversation/these recommendations were discussed with patient primary care team, Dr. Tana Coast ? ?MDM High ?______________________________________________________ ?Tacey Ruiz ?[facility] Team ?Team Cell Phone: [phone] ?Please utilize secure chat  with additional questions, if there is no response within 30 minutes please call the above phone number ? ?Palliative Medicine Team providers are available by phone from 7am to 7pm daily and can be reached through the team cell phone.  ?Should this patient require assistance outside of these hours, please call the patient's attending physician. ? ? ?

## 2021-12-03 NOTE — Progress Notes (Signed)
? ?  Palliative Medicine Inpatient Follow Up Note ? ? ?I met this late afternoon with patients daughter, [name] and son, [name] ? ?A formal review of Felise's hospitalization was held. Patients children share that they do not want their mother to live artificially on machines if her condition is not going to improve. ? ?Both children share that they know there mother has received excellent care and been provided opportunity for improvements. As they days go on their optimism decreases.  ? ?I shared that I have spoke to both CCM, Neurology, and the primary team who all feel overall Gaynel has a very poor likelihood of recovery. They understand this however need more "data" to support their decisions moving forward. ? ?We reviewed the plan for a repeat MRI which will be pursued this evening. I shared that Neurologist, Dr. Hortense Ramal plans on calling them tomorrow to provide an update from her perspective. Patients children share they need a uniform concensus on patients long term prognosis as they don't want to drag any suffering out for her.  ? ?We discussed that the Palliative team will remain involved to help coordinate them receiving the needed information for them to make decisions moving forward. We did review what comfort oriented care would look like. [name] and [name] would wish to enable time for family to visit if that is the direction things go. ? ?We further addressed code status. They both agree that now, 21 days into her hospitalization which having had opportunity for improvement they would not want to put her through CPR. They are in alignment with a DNR given how much Neoma Laming has already endured these last few weeks. ? ?I shared that I will not be present tomorrow though I will request my colleague, Wadie Lessen reach out.  ? ?SUMMARY OF RECOMMENDATIONS   ?DNAR ? ?Neurology to follow up and discuss MRI results with family tomorrow ? ?Palliative care to arrange another meeting to further identify the  direction we are heading regarding care ? ?Ongoing support ? ?Time Spent: 60 additional minutes ? ?MDM - High ? ?______________________________________________________________________________________ ?Tacey Ruiz ?[facility] Team ?Team Cell Phone: [phone] ?Please utilize secure chat with additional questions, if there is no response within 30 minutes please call the above phone number ? ?Palliative Medicine Team providers are available by phone from 7am to 7pm daily and can be reached through the team cell phone.  ?Should this patient require assistance outside of these hours, please call the patient's attending physician. ? ? ? ? ?

## 2021-12-04 ENCOUNTER — Inpatient Hospital Stay (HOSPITAL_COMMUNITY): Payer: Medicaid Other

## 2021-12-04 DIAGNOSIS — Z930 Tracheostomy status: Secondary | ICD-10-CM | POA: Diagnosis not present

## 2021-12-04 DIAGNOSIS — Z515 Encounter for palliative care: Secondary | ICD-10-CM | POA: Diagnosis not present

## 2021-12-04 DIAGNOSIS — E119 Type 2 diabetes mellitus without complications: Secondary | ICD-10-CM | POA: Diagnosis not present

## 2021-12-04 DIAGNOSIS — I469 Cardiac arrest, cause unspecified: Secondary | ICD-10-CM | POA: Diagnosis not present

## 2021-12-04 DIAGNOSIS — G060 Intracranial abscess and granuloma: Secondary | ICD-10-CM | POA: Diagnosis not present

## 2021-12-04 DIAGNOSIS — Z9911 Dependence on respirator [ventilator] status: Secondary | ICD-10-CM | POA: Diagnosis not present

## 2021-12-04 LAB — GLUCOSE, CAPILLARY
Glucose-Capillary: 140 mg/dL — ABNORMAL HIGH (ref 70–99)
Glucose-Capillary: 156 mg/dL — ABNORMAL HIGH (ref 70–99)
Glucose-Capillary: 162 mg/dL — ABNORMAL HIGH (ref 70–99)
Glucose-Capillary: 184 mg/dL — ABNORMAL HIGH (ref 70–99)
Glucose-Capillary: 203 mg/dL — ABNORMAL HIGH (ref 70–99)
Glucose-Capillary: 221 mg/dL — ABNORMAL HIGH (ref 70–99)

## 2021-12-04 LAB — CBC
HCT: 37.4 % (ref 36.0–46.0)
Hemoglobin: 11.5 g/dL — ABNORMAL LOW (ref 12.0–15.0)
MCH: 29.3 pg (ref 26.0–34.0)
MCHC: 30.7 g/dL (ref 30.0–36.0)
MCV: 95.4 fL (ref 80.0–100.0)
Platelets: 540 10*3/uL — ABNORMAL HIGH (ref 150–400)
RBC: 3.92 MIL/uL (ref 3.87–5.11)
RDW: 20 % — ABNORMAL HIGH (ref 11.5–15.5)
WBC: 9.4 10*3/uL (ref 4.0–10.5)
nRBC: 0 % (ref 0.0–0.2)

## 2021-12-04 MED ORDER — MORPHINE 100MG IN NS 100ML (1MG/ML) PREMIX INFUSION
5.0000 mg/h | INTRAVENOUS | Status: DC
  Administered 2021-12-04 – 2021-12-05 (×2): 5 mg/h via INTRAVENOUS
  Administered 2021-12-05: 10 mg/h via INTRAVENOUS
  Administered 2021-12-06: 13 mg/h via INTRAVENOUS
  Administered 2021-12-06 (×2): 11 mg/h via INTRAVENOUS
  Administered 2021-12-07 (×2): 14 mg/h via INTRAVENOUS
  Administered 2021-12-07 – 2021-12-09 (×7): 15 mg/h via INTRAVENOUS
  Filled 2021-12-04 (×15): qty 100

## 2021-12-04 NOTE — Progress Notes (Signed)
Nutrition Brief Note ? ?Chart reviewed.  ?Noted plan to withdraw aggressive support tomorrow ?Currently receiving TF at goal rate. No further nutrition interventions planned at this time.  ?Please re-consult as needed.  ? ?Kerman Passey MS, RDN, LDN, CNSC ?Registered Dietitian III ?Clinical Nutrition ?RD Pager and On-Call Pager Number Located in [place]  ? ? ?

## 2021-12-04 NOTE — Progress Notes (Addendum)
? ?NAME:  Kristen Murphy, MRN:  [id number], DOB:  17-Aug-1966, LOS: 77 ?ADMISSION DATE:  11/12/2021, CONSULTATION DATE: 11/12/2021 ?REFERRING MD: Dr. Langston Masker, CHIEF COMPLAINT: PEA arrest ? ?History of Present Illness:  ?56 year old woman with a history of tobacco use, cervical cancer ([facility]), post nephrectomy and colostomy, CAD with LAD stent 01/2019, diabetes, depression, hypertension, remote CVA without known deficits.  Also with a history of substance abuse.  Brought in by her son to the ED 2/19 with fever, altered mental status and slurred speech. ? ?Apparently she had a flulike illness for a week, was found confused on 2/19.  Some suspicion by son that she may have been taking more prescription narcotics, also taking many NSAIDs per his report.  She was disoriented in the ED, febrile 103F, hyperglycemic. ?In the ED she apparently experienced acute hypoxemia, had some blood from her mouth, question emesis versus hemoptysis.  Devolved to PEA and required emergent ET intubation and CPR.  ROSC after 2 minutes.  OG tube placed with bright red blood obtained.  Chest x-ray with mild right hilar prominence, more notable on postintubation film.  No infiltrates or effusions ?She has received empiric antibiotics, is receiving 1 unit PRBC, is about to start Protonix infusion. ?Hemodynamically stabilized, mechanically ventilated. ? ?She remains critically ill. ? ?Pertinent  Medical History  ? ?Past Medical History:  ?Diagnosis Date  ? Anemia   ? Anxiety   ? Arthritis   ? Asthma   ? Cervical cancer ([place])   ? Chronic kidney disease   ? Coronary artery disease   ? Depression   ? Diabetes mellitus without complication ([place])   ? Dyspnea   ? Headache   ? Heart murmur   ? Hx of adenomatous polyp of colon   ? Hypertension   ? Rectovaginal fistula   ? Seizures ([place])   ? Stercoral ulcer of rectum 12/2020  ? Stroke [facility])   ? ? ?Significant Hospital Events: ?Including procedures, antibiotic start and stop dates in addition to other pertinent  events   ?2/19 Intubated. Head CT  > no acute CT findings, old left basal ganglia lacunar, old left frontal cortical and subcortical infarct ?MRI brain 2/21 numerous lesions with mild associated hemorrhage and edema. Neurosurgery reviewed imaging and patient not a good candidate for lesion aspiration. ?2/24 MR Brain - Numerous lesions with mild associated hemorrhage. Mild edema. Concerned for brain abscesses ?2/22 LP consistent with bacterial meningitis. Respiratory Cx +MRSA ?2/25 MR Liver with no lesions, hypoperfusion to the subcapsular right hepatic lobe, favor benign vascular phenomena, distended gallbladder, persistent region of hypoperfusion. EEG and LTM 2/25 neg for seizures.  ?Repeat head CT 2/27 with no acute changes  ?2/28 No acute events overnight, LTM read ? Seizure.  Cefepime switched to meropenem. ?3/2 LTM stopped. Meropenem changed to CTX. TEE + mobile mass on MV w/ mild to mod MR EF 60-65% ?3/3 change to IV heparin for possible trach  ?3/4 trach placed ?3/5 ? Seizure activity, LTM restarted, weaned 8 hrs  ?3/6 remains on LTM- no seizure to correlate with episodes of HR 130s, jaw clenching, urinary incontinence.  Linezolid changed to vanc per ID for longterm therapy, weaned x 8 hours, diuresing ?3/7 hypoglycemic overnight w/ held TF, PEG placed per IR, off propofol, LTM d/c'd, weaned x 8 hrs 8/8, 50% ?3/12 1. Numerous ring-enhancing lesions throughout the brain consistent with abscesses. These demonstrate increased enhancement, mildly increased size, and increased edema compared to the prior MRI. 2. Mild enhancement along the lateral  ventricles suggesting persistent ventriculitis.3. Subacute right frontal, right parietal, and right occipital infarcts.4. Widespread cortical T2 hyperintensity and mild enhancement favored to reflect subacute changes of diffuse hypoxic-ischemic injury. ? ?Interim History / Subjective:  ?Oxygen requirements increased today looks terrible ? ?Objective   ?Blood pressure  130/71, pulse 97, temperature 97.8 ?F (36.6 ?C), temperature source Axillary, resp. rate (Abnormal) 24, height '5\' 2"'$  (1.575 m), weight 70 kg, SpO2 97 %. ?   ?Vent Mode: AC ?FiO2 (%):  [50 %-100 %] 100 % ?Set Rate:  [20 bmp] 20 bmp ?Vt Set:  [400 mL] 400 mL ?PEEP:  [8 cmH20] 8 cmH20 ?Pressure Support:  [10 cmH20] 10 cmH20 ?Plateau Pressure:  [18 cmH20-27 cmH20] 18 cmH20  ? ?Intake/Output Summary (Last 24 hours) at 12/04/2021 0803 ?Last data filed at 12/04/2021 0800 ?Gross per 24 hour  ?Intake 988.5 ml  ?Output 1700 ml  ?Net -711.5 ml  ? ?Filed Weights  ? 12/02/21 0359 12/03/21 0400 12/04/21 0313  ?Weight: 68.8 kg 68.6 kg 70 kg  ? ?Physical Exam: ?General 56 year old female who maintains in essentially a vegetative state unable to wean from mechanical ventilation now on increased FiO2 and PEEP ?HEENT normocephalic atraumatic mucous membranes moist tracheostomy site unremarkable ?Pulmonary: Coarse bilateral breath sounds tachypneic increased PEEP/FiO2 needs out of proportion to chest x-ray findings ?PCXR w/ persistent bibasilar airspace disease which is stable when comparing prior films.  ?Cardiac: Regular rate and rhythm ?Abdomen soft extremities dependent edema bilateral foot drop ?Neuro eyes open chewing type activity noted, not interactive ?GU clear yellow ? ? ?Resolved Hospital Problem list   ?Hyponatremia ?Hypokalemia ?Hypocalcemia ?Septic shock resolved ?Urinary tract infection with Pseudomonas and Enterobacter (completed rx) ?Acute cardiopulmonary arrest, PEA arrest ? 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 ?Acute hypoxemic respiratory failure due to necrosing MRSA pneumonia ?Tracheostomy status (3/4) ?Multifactorial encephalopathy in setting of MRSA CNS infection, sedation, post arrest,and sepsis.   ?Acute on chronic anemia, UGIB- s/p EGD on 2/19 showing erosive gastropathy ?Hx tobacco abuse ?PICC-associated DVT BLE- on AC, PICC removed_>on 41moAC ?Hx cervical Ca  post nephrectomy and diverting colostomy ?S/P in ER PEA arrest 11/12/21 ?Hx CAD s/p LAD stent 2020 ?DM w/ hyperglycemia ?Depression ?CKD ?Seizure hx ?HTN ?Remote CVA ?Distant hx substance abuse ?Tongue laceration ? ? ? ?Pulmonary Problem list  ?Ventilator/Tracheostomy Dependence 2/2 MRSA Necrotizing PNA, Bacteremia and brain abscess/ CNS involvement, complicated further by post-arrest anoxia and on-going severe metabolic encephalopathy  ? ?Discussion  ?No real progress on vent. Her neurological injury is her primary barrier to progression and MRI looking worse w/ persistent ventriculitis and increase in size of brain abscess. Also evidence of subacute infarcts and anoxic injury. There have been on-going discussions w/ palliative.  Its been about 2 weeks since I saw her last.  At which time we were still hopeful she might some response to antimicrobial therapy.  Unfortunately things have gotten worse instead of better.  I think at this point her chances of having a meaningful neurological recovery, more specifically a meaningful functional recovery are essentially nonexistent ? ?Plan ?Continuing full ventilator support for now ?Antibiotics as outlined by infectious disease ?At this point I think we need to continue discussion about goals of care.  When I saw her about 2 weeks ago the plan was to give her time to see if there is any hope for improvement, now things are worse.  Based on my discussion from that time I think we need to transition to  comfort in the near future ? ?Best Practice (right click and "Reselect all SmartList Selections" daily)  ? ?Diet/type: tubefeeds-  ?DVT prophylaxis: heparin gtt per pharmacy-> transition to eliquis this PM (24 hours after PEG placement) ?GI prophylaxis: PPI  ?Lines: N/A - PIV only, x2 ?Foley:  N/A- [place] ?Code Status:  full code ?Last date of multidisciplinary goals of care discussion: 3/6.  Family wants another 3 weeks to assess for any neuro improvements.  If no  progression, will consider transition to comfort care.  ? ?[name] ACNP-BC ?[place] ?Pager # [phone] OR # [phone] if no answer ? ? ?

## 2021-12-04 NOTE — Progress Notes (Signed)
Patient ID: Kristen Murphy, female   DOB: 1966/04/04, 56 y.o.   MRN: [id number] ? ? ? ?Progress Note from the Palliative Medicine Team at [facility] ? ? ?Patient Name: Kristen Murphy        ?Date: 12/04/2021 ?DOB: [birth year]  Age: 56 y.o. MRN#: [id number] ?Attending Physician: Mendel Corning, MD ?Primary Care Physician: Gweneth Fritter, FNP ?Admit Date: 11/12/2021 ? ? ?Medical records reviewed  ? ?56 year old woman with a history of tobacco use, cervical cancer ([facility]), post nephrectomy and colostomy, CAD with LAD stent 01/2019, diabetes, depression, hypertension, remote CVA without known deficits.  Also with a history of substance abuse.  Brought in by her son to the ED 2/19 with fever, altered mental status and slurred speech. ? ?Apparently she had a flulike illness for a week, was found confused on 2/19.  Some suspicion by son that she may have been taking more prescription narcotics, also taking many NSAIDs per his report.  She was disoriented in the ED, febrile 103F, hyperglycemic. ?In the ED she apparently experienced acute hypoxemia, had some blood from her mouth, question emesis versus hemoptysis.  Devolved to PEA and required emergent ET intubation and CPR.  ROSC after 2 minutes.  OG tube placed with bright red blood obtained.  Chest x-ray with mild right hilar prominence, more notable on postintubation film.  No infiltrates or effusions ?She has received empiric antibiotics, is receiving 1 unit PRBC, is about to start Protonix infusion. ?Hemodynamically stabilized, mechanically ventilated. ? ? ? ?2/19 Intubated. Head CT  > no acute CT findings, old left basal ganglia lacunar, old left frontal cortical and subcortical infarct ?MRI brain 2/21 numerous lesions with mild associated hemorrhage and edema. Neurosurgery reviewed imaging and patient not a good candidate for lesion aspiration. ?2/24 MR Brain - Numerous lesions with mild associated hemorrhage. Mild edema. Concerned for brain abscesses ?2/22 LP consistent  with bacterial meningitis. Respiratory Cx +MRSA ?2/25 MR Liver with no lesions, hypoperfusion to the subcapsular right hepatic lobe, favor benign vascular phenomena, distended gallbladder, persistent region of hypoperfusion. EEG and LTM 2/25 neg for seizures.  ?Repeat head CT 2/27 with no acute changes  ?2/28 No acute events overnight, LTM read ? Seizure.  Cefepime switched to meropenem. ?3/2 LTM stopped. Meropenem changed to CTX. TEE + mobile mass on MV w/ mild to mod MR EF 60-65% ?3/3 change to IV heparin for possible trach  ?3/4 trach placed ?3/5 ? Seizure activity, LTM restarted, weaned 8 hrs  ?3/6 remains on LTM- no seizure to correlate with episodes of HR 130s, jaw clenching, urinary incontinence.  Linezolid changed to vanc per ID for longterm therapy, weaned x 8 hours, diuresing ?3/7 hypoglycemic overnight w/ held TF, PEG placed per IR, off propofol, LTM d/c'd, weaned x 8 hrs 8/8, 50% ?3/12 1. Numerous ring-enhancing lesions throughout the brain consistent with abscesses. These demonstrate increased enhancement, mildly increased size, and increased edema compared to the prior MRI. 2. Mild enhancement along the lateral ventricles suggesting persistent ventriculitis.3. Subacute right frontal, right parietal, and right occipital infarcts.4. Widespread cortical T2 hyperintensity and mild enhancement favored to reflect subacute changes of diffuse hypoxic-ischemic injury. ? ? ?This NP visited patient at the bedside as a follow up to  yesterday's [place], for palliative medicine needs and emotional support.  ? ?I spoke to patient's daughter/[name] by phone for continued education regarding current medical situation.  Family understand the seriousness of the patient's current medical situation specific to sepsis secondary to meningitis with  worsening brain abscesses.  Family have had multiple conversations in the last 24 to 48 hours with various specialists.  ? ?Family have come to an understanding  that patient's significant neurologic injury and that patient has grim prognosis for any meaningful neurologic recovery.  With this knowing and understanding , family is making decision to shift to a more comfort path. ? ?Plan of care ?-DNR/DNI ?-Continue current supportive measures until family meet tomorrow at bedside at 12:00, liberating patient from life prolonging measures allowing for comfort and dignity at this time in [name]'s life. ?-No escalation of care ?-Provide comfort measures  within the context of continuing current supportive measures ? ? ?Questions and concerns addressed   Discussed with treatment team ? ?Total time spent on the unit was 60 minutes ? ?Greater than 50% of the time was spent in counseling and coordination of care ? ?Wadie Lessen NP  ?Palliative Medicine Team Team Phone # [phone] ?Pager [phone] ?  ?

## 2021-12-04 NOTE — Plan of Care (Signed)
  Problem: Health Behavior/Discharge Planning: Goal: Ability to manage health-related needs will improve Outcome: Not Progressing   

## 2021-12-04 NOTE — Progress Notes (Signed)
Subjective: No clinical seizures. Had desaturation overnight.  ? ?ROS: Unable to obtain due to poor mental status ? ?Examination ? ?Vital signs in last 24 hours: ?Temp:  [97.4 ?F (36.3 ?C)-99.4 ?F (37.4 ?C)] 97.8 ?F (36.6 ?C) (03/13 0300) ?Pulse Rate:  [82-106] 97 (03/13 0800) ?Resp:  [18-28] 24 (03/13 0800) ?BP: (114-162)/(58-95) 130/71 (03/13 0743) ?SpO2:  [88 %-99 %] 97 % (03/13 0800) ?FiO2 (%):  [50 %-100 %] 100 % (03/13 0800) ?Weight:  [70 kg] 70 kg (03/13 0313) ? ?General: lying in bed, eyes rolled upwards ?RS: Trach in place ?Neuro: Opens eyes spontaneously but does not look at the examiner, has downward nystagmus, does not track examiner, does not follow commands,  both pupils reacting to light, corneal reflex intact, withdraws to noxious stimuli in all 4 extremities with extensor posturing in bilateral upper extremities ? ?Basic Metabolic Panel: ?Recent Labs  ?Lab 11/28/21 ?0128 11/29/21 ?0157 11/29/21 ?9233 11/30/21 ?0133 12/01/21 ?0076 12/02/21 ?0135 12/02/21 ?2350  ?NA 138 133* 134* 137 135 134* 132*  ?K 4.0 4.6  --  3.5 3.6 4.2 3.9  ?CL 99 97*  --  99 100 100 95*  ?CO2 29 26  --  '28 23 22 24  '$ ?GLUCOSE 88 160*  --  125* 170* 164* 121*  ?BUN 26* 22*  --  22* 25* 28* 30*  ?CREATININE 0.63 0.67  --  0.72 0.68 0.67 0.66  ?CALCIUM 8.6* 8.6*  --  8.9 8.6* 9.0 8.8*  ?MG 2.3 2.1  --   --   --   --   --   ?PHOS 3.1  --   --   --   --   --   --   ? ? ?CBC: ?Recent Labs  ?Lab 11/30/21 ?0133 12/01/21 ?0108 12/02/21 ?0135 12/02/21 ?2350 12/04/21 ?0032  ?WBC 8.8 8.1 8.6 9.2 9.4  ?HGB 10.0* 9.9* 10.7* 11.1* 11.5*  ?HCT 33.0* 32.8* 35.8* 35.5* 37.4  ?MCV 92.4 93.4 93.7 93.4 95.4  ?PLT 500* 529* 524* 515* 540*  ? ? ? ?Coagulation Studies: ?No results for input(s): LABPROT, INR in the last 72 hours. ? ?Imaging ?MRI brain with and without contrast 12/03/2021:  ?1. Numerous ring-enhancing lesions throughout the brain consistent ?with abscesses. These demonstrate increased enhancement, mildly ?increased size, and increased  edema compared to the prior MRI. ?2. Mild enhancement along the lateral ventricles suggesting ?persistent ventriculitis. ?3. Subacute right frontal, right parietal, and right occipital ?infarcts. ?4. Widespread cortical T2 hyperintensity and mild enhancement ?favored to reflect subacute changes of diffuse hypoxic-ischemic ?injury. ? ? ?  ? ? ?ASSESSMENT AND PLAN:  56 year old female  with a history of cervical cancer, status post nephrectomy 2 months ago, colostomy, chronic occlusion of the left common carotid artery, CAD, diabetes, depression, hypertension, remote history of stroke without residual deficits who was brought in by EMS for altered mental status. She had had a flulike illness for a week PTA and was found confused on 2/19. The patient had acute PEA arrest while in the ED on Sunday (2/19) with CPR time of 2 minutes. Diagnosed with acute respiratory failure with hypoxemia and was intubated at that time. MRI brain performed on Tuesday (2/21)revealed numerous cerebral abscesses, LP consistent with Meningitis.  Currently on IV vancomycin for 8 weeks (last date April 16).   ?  ?Acute bacterial meningitis ?Acute infectious encephalopathy ?Seizure ?Brain abscess ?- No Seizure-like episodes overnight ?  ?Recommendations ?-Continue gabapentin, Keppra and Vimpat at current dose ?-Patient has been off sedation for over  5 days with minimal improvement in mental status.  Patient's MRI shows worsening despite being on antibiotics for over 3 weeks. Discussed with daughter that I am concerned patient has had significant neurological injury with minimal chances of meaningful neurologic recovery. Daughter states patient would not have wanted to lie this way. Family coming tomorrow. Daughter discussed with ICU team to start her on low dose morphine today to minimize pain with plan to transition to comfort care tomorrow ?- Continue seizure precautions ?- Management of rest of comorbidities per primary team ?- Discussed plan  with patient's RN at bedside, Dr Tana Coast, ICU team and palliative care team ?  ?I have spent a total of 40 minutes with the patient reviewing hospital notes,  test results, labs and examining the patient as well as establishing an assessment and plan.  > 50% of time was spent in direct patient care. ?  ?Zeb Comfort ?Epilepsy ?[facility] ?For questions after 5pm please refer to AMION to reach the Neurologist on call ? ?

## 2021-12-04 NOTE — Progress Notes (Signed)
[facility]                                                                              Patient Demographics  Kristen Murphy, is a 56 y.o. female, DOB - [birth year], BTD:[id number]  Admit date - 11/12/2021   Admitting Physician Collene Gobble, MD  Outpatient Primary MD for the patient is Gweneth Fritter, FNP  Outpatient specialists:   LOS - 22  days   Medical records reviewed and are as summarized below:    Chief Complaint  Patient presents with   Altered Mental Status       Brief summary   Per admission history by PCCM on 2/19 Patient is a 56 year old female with history of tobacco use, cervical CA, post nephrectomy and colostomy, CAD with LAD stenting 01/2019, diabetes, depression, hypertension, remote CVA, substance use, was brought in by her son to the ED on 2/19 with fever, altered mental status and slurred speech. Apparently, she had a flulike illness for a week, was found confused on 2/19.  There was some suspicion by son that she may have been taking more prescription narcotics, many NSAIDs.  She was disoriented in the ED, febrile 103 F, hyperglycemic.  In ED, she also experienced acute hypoxemia, had some blood from her mouth, emesis versus hemoptysis.  Reported it to PEA and required emergent ED intubation and CPR.  ROSC after 2 minutes, OG tube placed with bright red blood obtained.  Chest x-ray with mild right hilar prominence, more notable on postintubation films, no infiltrates or effusions. Received empiric antibiotics, packed RBC transfusion and admitted to ICU. 2/19 Intubated. Head CT  > no acute CT findings, old left basal ganglia lacunar, old left frontal cortical and subcortical infarct MRI brain 2/21 numerous lesions with mild associated hemorrhage and edema. Neurosurgery reviewed imaging and patient not a good candidate for lesion aspiration. 2/24 MR Brain - Numerous lesions with mild associated hemorrhage. Mild edema. Concerned for brain  abscesses 2/22 LP consistent with bacterial meningitis. Respiratory Cx +MRSA 2/25 MR Liver with no lesions, hypoperfusion to the subcapsular right hepatic lobe, favor benign vascular phenomena, distended gallbladder, persistent region of hypoperfusion. EEG and LTM 2/25 neg for seizures.  Repeat head CT 2/27 with no acute changes  2/28 No acute events overnight, LTM read ? Seizure.  Cefepime switched to meropenem. 3/2 LTM stopped. Meropenem changed to CTX. TEE + mobile mass on MV w/ mild to mod MR EF 60-65% 3/3 change to IV heparin for possible trach  3/4 trach placed 3/5 ? Seizure activity, LTM restarted, weaned 8 hrs  3/6 remains on LTM- no seizure to correlate with episodes of HR 130s, jaw clenching, urinary incontinence.  Linezolid changed to vanc per ID for longterm therapy, weaned x 8 hours, diuresing 3/7 hypoglycemic overnight w/ held TF, PEG placed per IR, off propofol, LTM d/c'd, weaned x 8 hrs 8/8, 50% 3/9: Patient transferred to [facility], assumed care     Assessment & Plan    Principal Problem: Sepsis with brain abscess, MRSA bacteremia, MV endocarditis, pulmonary spread -Prolonged antibiotics per ID.  Remains on ceftriaxone/vancomycin (changed from linezolid 3/6 per ID), plan  for 8 weeks through April 16 or if repeat MRI shows clearance sooner -PICC cannot be placed due to thrombi in both upper extremities, continue peripheral IVs   Active Problems: Acute hypoxemic respiratory failure -Due to necrotizing MRSA pneumonia -CCM will continue to follow for vent support, ongoing trach care -place bite block for the tongue laceration -Overnight desaturation episodes, was placed on 100% FiO2, PEEP of 12, still appears tenuous.  CCM following.  Acute multifactorial encephalopathy -Secondary to #1, postarrest and sepsis. -Underwent prolonged EEG, stopped 3/7 due to no seizure activity -Neurology following, continue Keppra, Vimpat, gabapentin at current dose.  -Continue seizure  precautions -MRI brain repeated on 3/12 with numerous ring-enhancing lesions throughout the brain consistent with abscesses.  Increased enhancement and mildly increased size and increased edema compared to prior MRI.  Persistent ventriculitis, subacute right frontal right parietal and right occipital infarcts.  Widespread subacute changes of diffuse hypoxic-ischemic injury. -No significant neurological recovery, neurology to follow today  PICC associated upper extremity DVT bilaterally -Continue peripheral IVs, was on IV heparin, transitioned to Eliquis x 3 months  Nutrition -PEG tube placed on 3/7, core track discontinued -Continue tube feeds and medications via PEG tube  Diabetes mellitus, type II -G-tube placed on 3/7 by IR, tube feeds infusing -CBGs on lower side, will DC scheduled NovoLog, continue Semglee and sliding scale insulin  Essential hypertension -BP has been running soft, DC amlodipine, continue metoprolol  GOALS OF CARE: Discussed with patient's daughter, Kristen Murphy (patient's son also on the phone) yesterday on 3/12 3/13 today am: Called patient's daughter, Kristen Murphy, discussed MRI results and overnight issues with respiratory status.  Discussed goals of care, family leaning towards comfort care tomorrow. Updated the medical team (neurology, CCM, ID, palliative) to follow-up and discuss with patient's family today   Code Status: DNR DVT Prophylaxis:  SCDs Start: 11/12/21 1839 apixaban (ELIQUIS) tablet 10 mg  apixaban (ELIQUIS) tablet 5 mg   Level of Care: Level of care: ICU Family Communication: Discussed with patient's daughter, Kristen Murphy on phone today. Disposition Plan:     Status is: Inpatient  Procedures:  Mechanical ventilation PEG tube placement  Consultants:   Neurology Critical care Interventional radiology Palliative medicine  Antimicrobials:  IV vancomycin IV ceftriaxone   Medications  Scheduled Meds:  amLODipine  5 mg Per Tube Daily    apixaban  10 mg Per Tube BID   Followed by   Derrill Memo ON 12/06/2021] apixaban  5 mg Per Tube BID   artificial tears   Both Eyes Q8H   chlorhexidine gluconate (MEDLINE KIT)  15 mL Mouth Rinse BID   Chlorhexidine Gluconate Cloth  6 each Topical Daily   docusate  100 mg Per Tube BID   furosemide  40 mg Per Tube BID   gabapentin  300 mg Per Tube Q12H   insulin aspart  0-9 Units Subcutaneous Q4H   insulin glargine-yfgn  9 Units Subcutaneous BID   mouth rinse  15 mL Mouth Rinse 10 times per day   metoprolol tartrate  25 mg Per Tube BID   oxyCODONE  5 mg Per Tube Q6H   pantoprazole sodium  40 mg Per Tube Daily   polyethylene glycol  17 g Per Tube Daily   Continuous Infusions:  sodium chloride Stopped (11/21/21 1033)   sodium chloride 20 mL/hr at 11/23/21 1620   sodium chloride Stopped (11/26/21 1648)   cefTRIAXone (ROCEPHIN)  IV 2 g (12/04/21 0855)   feeding supplement (VITAL AF 1.2 CAL) 1,000 mL (12/03/21 2203)  lacosamide (VIMPAT) IV 100 mg (12/04/21 1021)   levETIRAcetam 1,500 mg (12/04/21 8372)   morphine 5 mg/hr (12/04/21 1256)   vancomycin 1,500 mg (12/03/21 2158)    Subjective:   Lakya Schrupp was seen and examined today.  Overnight issues noted, respiratory status worsening today with the desaturation episodes.  No improvement in the mental status.  Objective:   Vitals:   12/04/21 0800 12/04/21 1122 12/04/21 1144 12/04/21 1200  BP:   129/75 114/69  Pulse: 97  81 80  Resp: (!) 24  (!) 25 (!) 25  Temp:  98.7 F (37.1 C)    TempSrc:  Oral    SpO2: 97%  99% 99%  Weight:      Height:        Intake/Output Summary (Last 24 hours) at 12/04/2021 1432 Last data filed at 12/04/2021 1200 Gross per 24 hour  Intake 1248.5 ml  Output 2100 ml  Net -851.5 ml     Wt Readings from Last 3 Encounters:  12/04/21 70 kg  01/06/21 82 kg  12/07/19 90.3 kg   Physical Exam General: Unresponsive, trached, ill-appearing Cardiovascular: S1 S2 clear, RRR.  Respiratory: Scattered  rhonchi, breathing above the vent Gastrointestinal: Soft, PEG tube, colostomy with stool output Ext: trace pedal edema bilaterally Neuro: does not follow commands, unresponsive  Data Reviewed:  I have personally reviewed following labs and imaging studies  Radiology Reports CT HEAD WO CONTRAST (5MM)  Result Date: 11/20/2021 IMPRESSION: 1. No change from recent head CT to explain the new deficit. 2. Known numerous brain lesions and subacute right cerebral infarcts. Electronically Signed   By: Jorje Guild M.D.   On: 11/20/2021 10:58   CT HEAD WO CONTRAST (5MM)  Result Date: 11/19/2021  IMPRESSION: 1. Relatively well-defined focus of hypoattenuation in the superior, medial right frontal lobe and adjacent right parietal lobe, consistent with recent infarction, occurring since the brain MRI dated 11/14/2021. 2. Several small hypoattenuating lesions noted in the basal ganglia on the left, cerebral hemispheres and cerebellar hemispheres, corresponding to the lesions noted on the prior brain MRI. Differential diagnosis consistent with abscesses or metastatic disease. 3. Old left superior frontal lobe infarct stable from the prior head CT. Electronically Signed   By: Lajean Manes M.D.   On: 11/19/2021 14:32   CT HEAD WO CONTRAST (5MM)  IMPRESSION: No acute abnormality noted. No significant change from the prior exam. Electronically Signed   By: Inez Catalina M.D.   On: 11/12/2021 19:14   CT CHEST WO CONTRAST  Result Date: 11/19/2021  IMPRESSION: 1. Since the prior chest CT, numerous small ill-defined ground-glass nodular opacities have developed in both lungs. Multifocal infection is suspected. Findings could be inflammatory. 2. Bilateral lower lobe atelectasis, partly obscuring the right lower lobe cavitary consolidative process noted on the prior CT, which otherwise has not changed. Lung base opacities have significantly increased from the previous CT. New trace left pleural effusion. Small areas  of atelectasis or infection noted in the right middle lobe and left upper lobe lingula, also new. 3. Stable coronary artery calcifications and aortic atherosclerosis. 4. Prominent mediastinal lymph nodes increased from the prior CT, reactive. Aortic Atherosclerosis (ICD10-I70.0). Electronically Signed   By: Lajean Manes M.D.   On: 11/19/2021 14:43   CT Angio Chest PE W and/or Wo Contrast  Result Date: 11/12/2021 IMPRESSION: CTA of the chest: No evidence of pulmonary emboli. Large mildly cavitary infiltrate in superior segment of right lower lobe consistent with acute pneumonia. Some minimal infiltrate  is noted in the left base as well. Small bleb with air-fluid level within is noted in the medial aspect of the left base. These findings are all new from the prior exam. Resolution of previously seen effusions. 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 CT of the abdomen and pelvis: Postsurgical changes consistent with colostomy. Area of decreased attenuation and enhancement within the lateral segment of the left lobe of the liver with a somewhat mass like appearance along the margin of the liver. Possible metastatic disease would deserve consideration. Status post right nephrectomy. Electronically Signed   By: Inez Catalina M.D.   On: 11/12/2021 19:33   MR BRAIN W WO CONTRAST  Result Date: 11/14/2021 IMPRESSION: Numerous lesions in the brain. There are lesions in the cerebellar hemispheres and in both cerebral hemispheres widely distributed. Greater than 50 lesions are identified. The lesions have a rounded appearance with restricted diffusion and mild enhancement. Many lesions show evidence of mild associated hemorrhage. Mild edema. Restricted diffusion in the left lateral ventricle likely due to ventriculitis. 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 Differential diagnosis includes metastatic disease and demyelinating disease. Correlate  with lumbar puncture results. These results were called by telephone at the time of interpretation on 11/14/2021 at 4:46 pm to provider CHI ELLISON , who verbally acknowledged these results. Electronically Signed   By: Franchot Gallo M.D.   On: 11/14/2021 16:48   CT ABDOMEN PELVIS W CONTRAST  Result Date: 11/12/2021 IMPRESSION: CTA of the chest: No evidence of pulmonary emboli. Large mildly cavitary infiltrate in superior segment of right lower lobe consistent with acute pneumonia. Some minimal infiltrate is noted in the left base as well. Small bleb with air-fluid level within is noted in the medial aspect of the left base. These findings are all new from the prior exam. Resolution of previously seen effusions. 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 CT of the abdomen and pelvis: Postsurgical changes consistent with colostomy. Area of decreased attenuation and enhancement within the lateral segment of the left lobe of the liver with a somewhat mass like appearance along the margin of the liver. Possible metastatic disease would deserve consideration. Status post right nephrectomy. Electronically Signed   By: Inez Catalina M.D.   On: 11/12/2021 19:33   IR GASTROSTOMY TUBE MOD SED  Result Date: 11/28/2021 IMPRESSION: Percutaneous gastrostomy with placement of a 24-French bumper retention tube in the body of the stomach. This tube can be used for percutaneous feeds beginning in 24 hours after placement. Electronically Signed   By: Aletta Edouard M.D.   On: 11/28/2021 17:17   MR LIVER W WO CONTRAST  Result Date: 11/18/2021  IMPRESSION: 1. Interval increase in dense atelectasis/consolidation of the RIGHT lower lobe. 2. No worrisome lesions within the liver. Hypoperfusion to the subcapsular RIGHT hepatic lobe is favored benign vascular phenomena. 3. Distended gallbladder may relate to fasting state. 4. Persistent region of hypoperfusion to the spleen suggests splenic infarctions. 5. Solitary LEFT  kidney without complication. Electronically Signed   By: Suzy Bouchard M.D.   On: 11/18/2021 20:06   DG Chest Port 1 View  Result Date: 11/27/2021 IMPRESSION: 1. Question trace left pleural effusion. Lateral radiograph could further evaluate if clinically indicated. 2. Streaky left basilar opacity which could represent atelectasis, aspiration, and/or pneumonia. 3. Similar prominence of the right hilum with findings in this region better characterized on recent chest CT. Electronically Signed   By:  Margaretha Sheffield M.D.   On: 11/27/2021 07:56   EEG adult  Result Date: 11/26/2021 This EEG was obtained while sedated on propofol and is abnormal due to severe diffuse slowing indicative of global cerebral dysfunction, medication effect, or both. No epileptiform abnormalities were seen during this recording. Su Monks, MD [facility] [phone] If 7pm- 7am, please page neurology on call as listed in [place].   Overnight EEG with video  Result Date: 11/27/2021  IMPRESSION: This technically difficult study is suggestive of moderate to severe diffuse encephalopathy, nonspecific etiology.  No seizures or definite epileptiform discharges were seen during the study. Patient event button was pressed on 11/26/2021 at 2149 for unclear reasons without concomitant EEG change.  This was most likely not an epileptic event Patient event button was pressed on 11/26/2020 at 2253 for several whole-body tremors, tachycardia with heart rate of 130 and urinary incontinence.  No concomitant EEG changes seem to be in this episode.  This was most likely not an epileptic event Multiple episodes of eye rolling backwards recording during the study without concomitant EEG change.  These episodes are most likely not epileptic Lora Havens    ECHOCARDIOGRAM COMPLETE  Result Date: 11/13/2021 IMPRESSIONS  1. Left ventricular ejection fraction, by estimation, is 65 to 70%. The left ventricle has normal function. The left  ventricle has no regional wall motion abnormalities. Left ventricular diastolic parameters are consistent with Grade I diastolic dysfunction (impaired relaxation).  2. Right ventricular systolic function is normal. The right ventricular size is normal.  3. The mitral valve is normal in structure. No evidence of mitral valve regurgitation. Mild to moderate mitral stenosis.  4. The aortic valve is normal in structure. Aortic valve regurgitation is not visualized. Mild aortic valve stenosis. FINDINGS  Left Ventricle: Left ventricular ejection fraction, by estimation, is 65 to 70%. The left ventricle has normal function. The left ventricle has no regional wall motion abnormalities.   ECHO TEE  Result Date: 11/23/2021 IMPRESSIONS  1. Study terminated early as patient became hypoxic on the vent.  2. The mitral valve is thickened and mildly calcified. Leaflet motion is mildly restricted without significant stenosis (mean gradient 38mHg at HR 70bpm). There are mobile elements visualized on the anterior mitral valve leaflet which could represent possible vegetations given clinical picture although may also be redundant mitral valve tissue vs prior endocarditis. There is mild-to-moderate mitral regurgitation.  3. Left ventricular ejection fraction, by estimation, is 60 to 65%. The left ventricle has normal function.  4. Right ventricular systolic function is normal. The right ventricular size is normal.  5. No left atrial/left atrial appendage thrombus was detected.  6. The aortic valve is tricuspid. There is mild thickening of the aortic valve. Aortic valve regurgitation is not visualized. Aortic valve sclerosis is present, with no evidence of aortic valve stenosis.  7. No other valvular vegetations visualized.  VAS [place]ER EXTREMITY VENOUS DUPLEX  Result Date: 11/21/2021 UPPER VENOUS STUDY  Summary:  Right: Findings consistent with acute deep vein thrombosis involving the right brachial veins surrounding the PICC  line.  Left: No evidence of deep vein thrombosis in the upper extremity. Findings consistent with acute superficial vein thrombosis involving the left cephalic vein.  *See table(s) above for measurements and observations.  Diagnosing physician: CDeitra MayoMD Electronically signed by CDeitra MayoMD on 11/21/2021 at 6:17:06 PM.    Final    Lab Data:  CBC: Recent Labs  Lab 11/30/21 0133 12/01/21 0177903/11/23 0135 12/02/21 2350  12/04/21 0032  WBC 8.8 8.1 8.6 9.2 9.4  HGB 10.0* 9.9* 10.7* 11.1* 11.5*  HCT 33.0* 32.8* 35.8* 35.5* 37.4  MCV 92.4 93.4 93.7 93.4 95.4  PLT 500* 529* 524* 515* 035*   Basic Metabolic Panel: Recent Labs  Lab 11/28/21 0128 11/29/21 0157 11/29/21 0905 11/30/21 0133 12/01/21 0108 12/02/21 0135 12/02/21 2350  NA 138 133* 134* 137 135 134* 132*  K 4.0 4.6  --  3.5 3.6 4.2 3.9  CL 99 97*  --  99 100 100 95*  CO2 29 26  --  '28 23 22 24  ' GLUCOSE 88 160*  --  125* 170* 164* 121*  BUN 26* 22*  --  22* 25* 28* 30*  CREATININE 0.63 0.67  --  0.72 0.68 0.67 0.66  CALCIUM 8.6* 8.6*  --  8.9 8.6* 9.0 8.8*  MG 2.3 2.1  --   --   --   --   --   PHOS 3.1  --   --   --   --   --   --      CBG: Recent Labs  Lab 12/03/21 2055 12/03/21 2348 12/04/21 0306 12/04/21 0737 12/04/21 1119  GLUCAP 201* 186* 203* 184* 221*    Sukari Grist M.D. [facility] 12/04/2021, 2:32 PM  Available via Epic secure chat 7am-7pm After 7 pm, please refer to night coverage provider listed on amion.

## 2021-12-05 DIAGNOSIS — Z930 Tracheostomy status: Secondary | ICD-10-CM | POA: Diagnosis not present

## 2021-12-05 DIAGNOSIS — E119 Type 2 diabetes mellitus without complications: Secondary | ICD-10-CM | POA: Diagnosis not present

## 2021-12-05 DIAGNOSIS — J9601 Acute respiratory failure with hypoxia: Secondary | ICD-10-CM | POA: Diagnosis not present

## 2021-12-05 DIAGNOSIS — Z9911 Dependence on respirator [ventilator] status: Secondary | ICD-10-CM | POA: Diagnosis not present

## 2021-12-05 DIAGNOSIS — I469 Cardiac arrest, cause unspecified: Secondary | ICD-10-CM | POA: Diagnosis not present

## 2021-12-05 DIAGNOSIS — A419 Sepsis, unspecified organism: Secondary | ICD-10-CM | POA: Diagnosis not present

## 2021-12-05 DIAGNOSIS — Z515 Encounter for palliative care: Secondary | ICD-10-CM | POA: Diagnosis not present

## 2021-12-05 DIAGNOSIS — G060 Intracranial abscess and granuloma: Secondary | ICD-10-CM | POA: Diagnosis not present

## 2021-12-05 LAB — CBC
HCT: 38 % (ref 36.0–46.0)
Hemoglobin: 11.6 g/dL — ABNORMAL LOW (ref 12.0–15.0)
MCH: 29.3 pg (ref 26.0–34.0)
MCHC: 30.5 g/dL (ref 30.0–36.0)
MCV: 96 fL (ref 80.0–100.0)
Platelets: 456 10*3/uL — ABNORMAL HIGH (ref 150–400)
RBC: 3.96 MIL/uL (ref 3.87–5.11)
RDW: 19.5 % — ABNORMAL HIGH (ref 11.5–15.5)
WBC: 12.1 10*3/uL — ABNORMAL HIGH (ref 4.0–10.5)
nRBC: 0 % (ref 0.0–0.2)

## 2021-12-05 LAB — GLUCOSE, CAPILLARY
Glucose-Capillary: 135 mg/dL — ABNORMAL HIGH (ref 70–99)
Glucose-Capillary: 165 mg/dL — ABNORMAL HIGH (ref 70–99)
Glucose-Capillary: 179 mg/dL — ABNORMAL HIGH (ref 70–99)

## 2021-12-05 MED ORDER — MORPHINE BOLUS VIA INFUSION
1.0000 mg | INTRAVENOUS | Status: DC | PRN
  Administered 2021-12-06 – 2021-12-08 (×11): 1 mg via INTRAVENOUS
  Filled 2021-12-05: qty 1

## 2021-12-05 MED ORDER — ACETAMINOPHEN 650 MG RE SUPP: 650.0000 mg | Freq: Four times a day (QID) | RECTAL | Status: DC | PRN

## 2021-12-05 MED ORDER — ACETAMINOPHEN 325 MG PO TABS: 650.0000 mg | ORAL_TABLET | Freq: Four times a day (QID) | ORAL | Status: DC | PRN

## 2021-12-05 MED ORDER — GLYCOPYRROLATE 0.2 MG/ML IJ SOLN
0.2000 mg | INTRAMUSCULAR | Status: DC | PRN
  Administered 2021-12-05 – 2021-12-08 (×10): 0.2 mg via INTRAVENOUS
  Filled 2021-12-05 (×9): qty 1

## 2021-12-05 MED ORDER — GLYCOPYRROLATE 0.2 MG/ML IJ SOLN
0.2000 mg | INTRAMUSCULAR | Status: DC | PRN
  Filled 2021-12-05: qty 1

## 2021-12-05 MED ORDER — DEXTROSE 5 % IV SOLN: INTRAVENOUS | Status: DC

## 2021-12-05 MED ORDER — POLYVINYL ALCOHOL 1.4 % OP SOLN
1.0000 [drp] | Freq: Four times a day (QID) | OPHTHALMIC | Status: DC | PRN
  Filled 2021-12-05: qty 15

## 2021-12-05 MED ORDER — GLYCOPYRROLATE 1 MG PO TABS
1.0000 mg | ORAL_TABLET | ORAL | Status: DC | PRN
  Filled 2021-12-05: qty 1

## 2021-12-05 MED ORDER — DIPHENHYDRAMINE HCL 50 MG/ML IJ SOLN: 25.0000 mg | INTRAMUSCULAR | Status: DC | PRN

## 2021-12-05 NOTE — Procedures (Signed)
Extubation Procedure Note ? ?Patient Details:   ?Name: Kristen Murphy ?DOB: 09/22/1966 ?MRN: [id number] ?  ?Airway Documentation:  ?  ?Vent end date: 12/05/21 Vent end time: 8016  ? ?Evaluation ? O2 sats: stable throughout ?Complications: No apparent complications ?Patient did tolerate procedure well. ?Bilateral Breath Sounds: Diminished ?  ?No ?Pt was extubated for comfort measures on 8L 35% trach collar. RT will continue to monitor for any changes.  ? ?Felecia Jan ?12/05/2021, 12:44 PM ? ?

## 2021-12-05 NOTE — Plan of Care (Signed)
?  Interdisciplinary Goals of Care Family Meeting ? ? ?Date carried out:: 12/05/2021 ? ?Location of the meeting: Bedside ? ?Member's involved: Nurse Practitioner, Bedside Registered Nurse, Family Member or next of kin, and Palliative care team member ? ?Durable Power of Tour manager: daughter and son    ? ?Discussion: We discussed goals of care for Kristen Murphy .  See prior  ? ?Code status: Full DNR ? ?Disposition: In-patient comfort care ? ? ?Time spent for the meeting: 12 minutes ?Duplicate note ? ? ?Kristen Murphy ?12/05/2021, 1:28 PM ? ?

## 2021-12-05 NOTE — Plan of Care (Signed)
?  Interdisciplinary Goals of Care Family Meeting ? ? ?Date carried out: 12/05/2021 ? ?Location of the meeting: Bedside ? ?Member's involved: Nurse Practitioner, Bedside Registered Nurse, Family Member or next of kin, and Palliative care team member ? ?Durable Power of Tour manager: daughter [name] and son [name].    ? ?Discussion: We discussed goals of care for Kristen Murphy .   ?Today we discussed current lack of clinical progression and plan to transition to full comfort.  ? ?Code status: Full Code ? ?Disposition: In-patient comfort care ? ?Time spent for the meeting: 12 minutes ? ? ?Clementeen Graham, NP ? ?12/05/2021, 12:25 PM ? ? ?

## 2021-12-05 NOTE — Progress Notes (Signed)
Patient ID: Kristen Murphy, female   DOB: 1966-04-20, 56 y.o.   MRN: [id number] ? ? ? ?Progress Note from the Palliative Medicine Team at [facility] ? ? ?Patient Name: Kristen Murphy        ?Date: 12/05/2021 ?DOB: 17-Nov-1965  Age: 56 y.o. MRN#: [id number] ?Attending Physician: Mendel Corning, MD ?Primary Care Physician: Gweneth Fritter, FNP ?Admit Date: 11/12/2021 ? ? ?Medical records reviewed  ? ?56 year old woman with a history of tobacco use, cervical cancer ([facility]), post nephrectomy and colostomy, CAD with LAD stent 01/2019, diabetes, depression, hypertension, remote CVA without known deficits.  Also with a history of substance abuse.  Brought in by her son to the ED 2/19 with fever, altered mental status and slurred speech. ? ?She had a flulike illness for a week, was found confused on 2/19.  Some suspicion by son that she may have been taking more prescription narcotics, also taking many NSAIDs per his report.  She was disoriented in the ED, febrile 103F, hyperglycemic. ? ?In the ED she apparently experienced acute hypoxemia, had some blood from her mouth, question emesis versus hemoptysis.  Devolved to PEA and required emergent ET intubation and CPR.  ROSC after 2 minutes.  ? ?Significant Hospital Events: ?  ?2/19 Intubated. Head CT  > no acute CT findings, old left basal ganglia lacunar, old left frontal cortical and subcortical infarct ?MRI brain 2/21 numerous lesions with mild associated hemorrhage and edema. Neurosurgery reviewed imaging and patient not a good candidate for lesion aspiration. ?2/24 MR Brain - Numerous lesions with mild associated hemorrhage. Mild edema. Concerned for brain abscesses ?2/22 LP consistent with bacterial meningitis. Respiratory Cx +MRSA ?2/25 MR Liver with no lesions, hypoperfusion to the subcapsular right hepatic lobe, favor benign vascular phenomena, distended gallbladder, persistent region of hypoperfusion. EEG and LTM 2/25 neg for seizures.  ?Repeat head CT 2/27 with no acute  changes  ?2/28 No acute events overnight, LTM read ? Seizure.  Cefepime switched to meropenem. ?3/2 LTM stopped. Meropenem changed to CTX. TEE + mobile mass on MV w/ mild to mod MR EF 60-65% ?3/3 change to IV heparin for possible trach  ?3/4 trach placed ?3/5  Seizure activity ?3/6 remains on LTM- no seizure to correlate with episodes of HR 130s, jaw clenching, urinary incontinence.  Linezolid changed to vanc per ID for longterm therapy, weaned x 8 hours, diuresing ?3/7 hypoglycemic overnight w/ held TF, PEG placed per IR, off propofol, LTM d/c'd, weaned x 8 hrs 8/8, 50% ?3/12 1. Numerous ring-enhancing lesions throughout the brain consistent with abscesses. These demonstrate increased enhancement, mildly increased size, and increased edema compared to the prior MRI. 2. Mild enhancement along the lateral ventricles suggesting persistent ventriculitis.3. Subacute right frontal, right parietal, and right occipital infarcts.4. Widespread cortical T2 hyperintensity and mild enhancement favored to reflect subacute changes of diffuse hypoxic-ischemic injury. ?3/13 morphine gtt started for comfort. Transitioning to comfort care ?  ? ?This NP visited patient at the bedside as a follow up for palliative medicine needs and emotional support.  Family has made decision to shift to full comfort allowing for a natural death, focusing on comfort and dignity. ? ?CCM/Pete Babcock NP at bedside updating/supporting family.  Daughter, son, mother and friend at the bedside.  ? ?Education offered on the logistics of liberating [name] from ventilator, utilizing medication to ensure comfort. ? ?Family understand the limited prognosis of likely hours to days.    Comfort cart to the room.    Emotional  support offered. ? ?Questions and concerns addressed    ? ?    Later returned to room to assess, patient appears comfortable.   Family gathered in room.  Request for chaplin services at this time, unit secretary to contact. ? ? ?Wadie Lessen  NP  ?Palliative Medicine Team Team Phone # [phone] ?Pager [phone] ?  ?

## 2021-12-05 NOTE — Progress Notes (Signed)
?      ? ? ? [facility] ?                                                                            ? ?Patient Demographics ? ?Kristen Murphy, is a 56 y.o. female, DOB - 10/14/1965, GTX:[id number] ? ?Admit date - 11/12/2021   Admitting Physician Collene Gobble, MD ? ?Outpatient Primary MD for the patient is Goins, Gillis Santa, FNP ? ?Outpatient specialists:  ? ?LOS - 23  days  ? ?Medical records reviewed and are as summarized below: ? ? ? ?Chief Complaint  ?Patient presents with  ? Altered Mental Status  ?    ? ?Brief summary  ? ?Per admission history by PCCM on 2/19 ?Patient is a 56 year old female with history of tobacco use, cervical CA, post nephrectomy and colostomy, CAD with LAD stenting 01/2019, diabetes, depression, hypertension, remote CVA, substance use, was brought in by her son to the ED on 2/19 with fever, altered mental status and slurred speech. ?Apparently, she had a flulike illness for a week, was found confused on 2/19.  There was some suspicion by son that she may have been taking more prescription narcotics, many NSAIDs.  She was disoriented in the ED, febrile 103 ?F, hyperglycemic.  In ED, she also experienced acute hypoxemia, had some blood from her mouth, emesis versus hemoptysis.  Reported it to PEA and required emergent ED intubation and CPR.  ROSC after 2 minutes, OG tube placed with bright red blood obtained.  Chest x-ray with mild right hilar prominence, more notable on postintubation films, no infiltrates or effusions. ?Received empiric antibiotics, packed RBC transfusion and admitted to ICU. ?2/19 Intubated. Head CT  > no acute CT findings, old left basal ganglia lacunar, old left frontal cortical and subcortical infarct ?MRI brain 2/21 numerous lesions with mild associated hemorrhage and edema. Neurosurgery reviewed imaging and patient not a good candidate for lesion aspiration. ?2/24 MR Brain - Numerous lesions with mild associated hemorrhage. Mild edema. Concerned for brain  abscesses ?2/22 LP consistent with bacterial meningitis. Respiratory Cx +MRSA ?2/25 MR Liver with no lesions, hypoperfusion to the subcapsular right hepatic lobe, favor benign vascular phenomena, distended gallbladder, persistent region of hypoperfusion. EEG and LTM 2/25 neg for seizures.  ?Repeat head CT 2/27 with no acute changes  ?2/28 No acute events overnight, LTM read ? Seizure.  Cefepime switched to meropenem. ?3/2 LTM stopped. Meropenem changed to CTX. TEE + mobile mass on MV w/ mild to mod MR EF 60-65% ?3/3 change to IV heparin for possible trach  ?3/4 trach placed ?3/5 ? Seizure activity, LTM restarted, weaned 8 hrs  ?3/6 remains on LTM- no seizure to correlate with episodes of HR 130s, jaw clenching, urinary incontinence.  Linezolid changed to vanc per ID for longterm therapy, weaned x 8 hours, diuresing ?3/7 hypoglycemic overnight w/ held TF, PEG placed per IR, off propofol, LTM d/c'd, weaned x 8 hrs 8/8, 50% ?3/9: Patient transferred to [facility], assumed care  ?3/14: Patient transitioned to comfort care ? ? ? ?Assessment & Plan  ? ? ?Principal Problem: ?Sepsis with brain abscess, MRSA bacteremia, MV endocarditis, pulmonary spread ?-Prolonged antibiotics per ID.  Remains on ceftriaxone/vancomycin (changed  from linezolid 3/6 per ID), plan for 8 weeks through April 16 or if repeat MRI shows clearance sooner ?-PICC cannot be placed due to thrombi in both upper extremities, continue peripheral IVs ?-Palliative GOC meeting, transitioned to comfort care ? ? ?Active Problems: ?Acute hypoxemic respiratory failure ?-Due to necrotizing MRSA pneumonia ?-Appreciate CCM assistance, transition to full comfort care.  DC ventilator today.  Started on morphine and titrate as needed for comfort. ? ?Acute multifactorial encephalopathy ?-Secondary to #1, postarrest and sepsis. ?-Underwent prolonged EEG, stopped 3/7 due to no seizure activity ?-Neurology following, continue Keppra, Vimpat, gabapentin at current dose.  ?-Continue  seizure precautions ?-MRI brain repeated on 3/12 with numerous ring-enhancing lesions throughout the brain consistent with abscesses.  Increased enhancement and mildly increased size and increased edema compared to prior MRI.  Persistent ventriculitis, subacute right frontal right parietal and right occipital infarcts.  Widespread subacute changes of diffuse hypoxic-ischemic injury. ?-Transitioned to comfort care ? ?PICC associated upper extremity DVT bilaterally ?-Continue peripheral IVs, was on IV heparin, transitioned to Eliquis x 3 months ?-DC all meds not contributing to poor comfort ? ?Nutrition ?-PEG tube placed on 3/7, core track discontinued, patient was placed on tube feeds ? ? ?Diabetes mellitus, type II ?-G-tube placed on 3/7 by IR, tube feeds infusing ?-CBGs on lower side, will DC scheduled NovoLog, continue Semglee and sliding scale insulin ? ?Essential hypertension ?-Continue comfort care status ? ?GOALS OF CARE: ?Discussed with patient's daughter, [name] (patient's son also on the phone) yesterday on 3/12 ?3/13 today am: Called patient's daughter, [name], discussed MRI results and overnight issues with respiratory status.  Discussed goals of care, family leaning towards comfort care tomorrow.  ?3/14: Palliative GOC meeting today.  Transitioned to comfort care ? ? ?Code Status: DNR ?DVT Prophylaxis:  SCDs Start: 11/12/21 1839 ? ? ?Level of Care: Level of care: ICU ?Family Communication: Discussed with patient's daughter, [name] on 3/13 ?Disposition Plan:     Status is: Inpatient ? ?Procedures:  ?Mechanical ventilation ?PEG tube placement ? ?Consultants:   ?Neurology ?Critical care ?Interventional radiology ?Palliative medicine ? ?Antimicrobials:  ?IV vancomycin ?IV ceftriaxone ? ? ?Medications ? ?Scheduled Meds: ? artificial tears   Both Eyes Q8H  ? mouth rinse  15 mL Mouth Rinse 10 times per day  ? ?Continuous Infusions: ? sodium chloride Stopped (11/21/21 1033)  ? sodium chloride 20  mL/hr at 11/23/21 1620  ? sodium chloride Stopped (11/26/21 1648)  ? lacosamide (VIMPAT) IV Stopped (12/05/21 3532)  ? levETIRAcetam Stopped (12/05/21 9924)  ? morphine 9 mg/hr (12/05/21 1400)  ? ? ?Subjective:  ? ?Kristen Murphy was seen and examined today.  No significant neurological improvement.  Still on 100% FiO2, PEEP of 12 however appears comfortable, was started on morphine yesterday.  ? ?Objective:  ? ?Vitals:  ? 12/05/21 0800 12/05/21 0900 12/05/21 1133 12/05/21 1241  ?BP: 109/72 117/71    ?Pulse: 98 91  89  ?Resp: '19 20  12  '$ ?Temp: 98.8 ?F (37.1 ?C)     ?TempSrc: Oral     ?SpO2: 99% 100% 99% 98%  ?Weight:      ?Height:      ? ? ?Intake/Output Summary (Last 24 hours) at 12/05/2021 1456 ?Last data filed at 12/05/2021 1400 ?Gross per 24 hour  ?Intake 3221.26 ml  ?Output 1200 ml  ?Net 2021.26 ml  ? ? ? ?Wt Readings from Last 3 Encounters:  ?12/04/21 70 kg  ?01/06/21 82 kg  ?12/07/19 90.3 kg  ? ?Physical Exam ?General:  Unresponsive, trached, ill-appearing ?Cardiovascular: S1 S2 clear, RRR.  ?Respiratory: Scattered rhonchi ?Gastrointestinal: Soft,  PEG tube  ?Ext: 1+ pedal edema bilaterally ?Neuro: does not follow commands unresponsive ? ?Data Reviewed:  I have personally reviewed following labs and imaging studies ? ?Radiology Reports ?CT HEAD WO CONTRAST (5MM) ? ?Result Date: 11/20/2021 ?IMPRESSION: 1. No change from recent head CT to explain the new deficit. 2. Known numerous brain lesions and subacute right cerebral infarcts. Electronically Signed   By: Jorje Guild M.D.   On: 11/20/2021 10:58  ? ?CT HEAD WO CONTRAST (5MM) ? ?Result Date: 11/19/2021 ? IMPRESSION: 1. Relatively well-defined focus of hypoattenuation in the superior, medial right frontal lobe and adjacent right parietal lobe, consistent with recent infarction, occurring since the brain MRI dated 11/14/2021. 2. Several small hypoattenuating lesions noted in the basal ganglia on the left, cerebral hemispheres and cerebellar hemispheres,  corresponding to the lesions noted on the prior brain MRI. Differential diagnosis consistent with abscesses or metastatic disease. 3. Old left superior frontal lobe infarct stable from the prior head CT. Electronically S

## 2021-12-05 NOTE — Progress Notes (Signed)
? ?NAME:  Kristen Murphy, MRN:  [id number], DOB:  12/08/1965, LOS: 75 ?ADMISSION DATE:  11/12/2021, CONSULTATION DATE: 11/12/2021 ?REFERRING MD: Dr. Langston Masker, CHIEF COMPLAINT: PEA arrest ? ?History of Present Illness:  ?56 year old woman with a history of tobacco use, cervical cancer ([facility]), post nephrectomy and colostomy, CAD with LAD stent 01/2019, diabetes, depression, hypertension, remote CVA without known deficits.  Also with a history of substance abuse.  Brought in by her son to the ED 2/19 with fever, altered mental status and slurred speech. ? ?Apparently she had a flulike illness for a week, was found confused on 2/19.  Some suspicion by son that she may have been taking more prescription narcotics, also taking many NSAIDs per his report.  She was disoriented in the ED, febrile 103F, hyperglycemic. ?In the ED she apparently experienced acute hypoxemia, had some blood from her mouth, question emesis versus hemoptysis.  Devolved to PEA and required emergent ET intubation and CPR.  ROSC after 2 minutes.  OG tube placed with bright red blood obtained.  Chest x-ray with mild right hilar prominence, more notable on postintubation film.  No infiltrates or effusions ?She has received empiric antibiotics, is receiving 1 unit PRBC, is about to start Protonix infusion. ?Hemodynamically stabilized, mechanically ventilated. ? ?She remains critically ill. ? ?Pertinent  Medical History  ? ?Past Medical History:  ?Diagnosis Date  ? Anemia   ? Anxiety   ? Arthritis   ? Asthma   ? Cervical cancer ([place])   ? Chronic kidney disease   ? Coronary artery disease   ? Depression   ? Diabetes mellitus without complication ([place])   ? Dyspnea   ? Headache   ? Heart murmur   ? Hx of adenomatous polyp of colon   ? Hypertension   ? Rectovaginal fistula   ? Seizures ([place])   ? Stercoral ulcer of rectum 12/2020  ? Stroke [facility])   ? ? ?Significant Hospital Events: ?Including procedures, antibiotic start and stop dates in addition to other pertinent  events   ?2/19 Intubated. Head CT  > no acute CT findings, old left basal ganglia lacunar, old left frontal cortical and subcortical infarct ?MRI brain 2/21 numerous lesions with mild associated hemorrhage and edema. Neurosurgery reviewed imaging and patient not a good candidate for lesion aspiration. ?2/24 MR Brain - Numerous lesions with mild associated hemorrhage. Mild edema. Concerned for brain abscesses ?2/22 LP consistent with bacterial meningitis. Respiratory Cx +MRSA ?2/25 MR Liver with no lesions, hypoperfusion to the subcapsular right hepatic lobe, favor benign vascular phenomena, distended gallbladder, persistent region of hypoperfusion. EEG and LTM 2/25 neg for seizures.  ?Repeat head CT 2/27 with no acute changes  ?2/28 No acute events overnight, LTM read ? Seizure.  Cefepime switched to meropenem. ?3/2 LTM stopped. Meropenem changed to CTX. TEE + mobile mass on MV w/ mild to mod MR EF 60-65% ?3/3 change to IV heparin for possible trach  ?3/4 trach placed ?3/5 ? Seizure activity, LTM restarted, weaned 8 hrs  ?3/6 remains on LTM- no seizure to correlate with episodes of HR 130s, jaw clenching, urinary incontinence.  Linezolid changed to vanc per ID for longterm therapy, weaned x 8 hours, diuresing ?3/7 hypoglycemic overnight w/ held TF, PEG placed per IR, off propofol, LTM d/c'd, weaned x 8 hrs 8/8, 50% ?3/12 1. Numerous ring-enhancing lesions throughout the brain consistent with abscesses. These demonstrate increased enhancement, mildly increased size, and increased edema compared to the prior MRI. 2. Mild enhancement along the lateral  ventricles suggesting persistent ventriculitis.3. Subacute right frontal, right parietal, and right occipital infarcts.4. Widespread cortical T2 hyperintensity and mild enhancement favored to reflect subacute changes of diffuse hypoxic-ischemic injury. ?3/13 morphine gtt started for comfort. Transitioning to comfort care ? ? ?Interim History / Subjective:  ?Looks more  comfortable on morphine  ? ?Objective   ?Blood pressure 117/71, pulse 91, temperature 98.8 ?F (37.1 ?C), temperature source Oral, resp. rate 20, height '5\' 2"'$  (1.575 m), weight 70 kg, SpO2 100 %. ?   ?Vent Mode: Volume support ?FiO2 (%):  [100 %] 100 % ?Set Rate:  [20 bmp] 20 bmp ?Vt Set:  [400 mL] 400 mL ?PEEP:  [12 cmH20] 12 cmH20 ?Plateau Pressure:  [14 cmH20-28 cmH20] 26 cmH20  ? ?Intake/Output Summary (Last 24 hours) at 12/05/2021 1109 ?Last data filed at 12/05/2021 0900 ?Gross per 24 hour  ?Intake 3311.12 ml  ?Output 1400 ml  ?Net 1911.12 ml  ? ?Filed Weights  ? 12/02/21 0359 12/03/21 0400 12/04/21 0313  ?Weight: 68.8 kg 68.6 kg 70 kg  ? ?Physical Exam: ? ?General sedated on vent  ?HENT NCAT no JVD MMM trach not remarkable  ?Pulm coarse  ?Card rrr ?Abd soft ?Ext dep edema  ?Neuro sedated  ? ? ?Resolved Hospital Problem list   ?Hyponatremia ?Hypokalemia ?Hypocalcemia ?Septic shock resolved ?Urinary tract infection with Pseudomonas and Enterobacter (completed rx) ?Acute cardiopulmonary arrest, PEA arrest ? 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 ?Acute hypoxemic respiratory failure due to necrosing MRSA pneumonia ?Tracheostomy status (3/4) ?Multifactorial encephalopathy in setting of MRSA CNS infection, sedation, post arrest,and sepsis.   ?Acute on chronic anemia, UGIB- s/p EGD on 2/19 showing erosive gastropathy ?Hx tobacco abuse ?PICC-associated DVT BLE- on AC, PICC removed_>on 36moAC ?Hx cervical Ca post nephrectomy and diverting colostomy ?S/P in ER PEA arrest 11/12/21 ?Hx CAD s/p LAD stent 2020 ?DM w/ hyperglycemia ?Depression ?CKD ?Seizure hx ?HTN ?Remote CVA ?Distant hx substance abuse ?Tongue laceration ?Comfort care status (initiated 3/13)  ? ? ?Pulmonary Problem list  ?Ventilator/Tracheostomy Dependence 2/2 MRSA Necrotizing PNA, Bacteremia and brain abscess/ CNS involvement, complicated further by post-arrest anoxia and on-going severe metabolic encephalopathy   ? ?Discussion  ?Looks more comfortable  ?Spoke w/ daughter yesterday. Planning on family meeting today w/ palliative  ? ?Plan ?Dc all rx that doesn't contribute to comfort ?Dc abx ?Cont morphine and titrate as needed ?Dc vent later today  ?Full comfort care  ? ?Best Practice (right click and "Reselect all SmartList Selections" daily)  ? ?Per primary  ? ?PErick ColaceACNP-BC ?[facility]?Pager # [phone] # [phone] no answer ? ? ?

## 2021-12-06 DIAGNOSIS — A419 Sepsis, unspecified organism: Secondary | ICD-10-CM | POA: Diagnosis not present

## 2021-12-06 DIAGNOSIS — Z9911 Dependence on respirator [ventilator] status: Secondary | ICD-10-CM | POA: Diagnosis not present

## 2021-12-06 DIAGNOSIS — G060 Intracranial abscess and granuloma: Secondary | ICD-10-CM | POA: Diagnosis not present

## 2021-12-06 DIAGNOSIS — Z515 Encounter for palliative care: Secondary | ICD-10-CM | POA: Diagnosis not present

## 2021-12-06 DIAGNOSIS — E119 Type 2 diabetes mellitus without complications: Secondary | ICD-10-CM | POA: Diagnosis not present

## 2021-12-06 DIAGNOSIS — I469 Cardiac arrest, cause unspecified: Secondary | ICD-10-CM | POA: Diagnosis not present

## 2021-12-06 LAB — CULTURE, FUNGUS WITHOUT SMEAR: Special Requests: NORMAL

## 2021-12-06 MED ORDER — LIP MEDEX EX OINT
TOPICAL_OINTMENT | CUTANEOUS | Status: DC | PRN
  Filled 2021-12-06: qty 7

## 2021-12-06 MED ORDER — MIDAZOLAM HCL 2 MG/2ML IJ SOLN
2.0000 mg | INTRAMUSCULAR | Status: DC | PRN
  Administered 2021-12-06 – 2021-12-08 (×13): 2 mg via INTRAVENOUS
  Filled 2021-12-06 (×13): qty 2

## 2021-12-06 NOTE — Progress Notes (Signed)
Transported patient from 2H07 to 6N03 with all her belongings and chart.  ?

## 2021-12-06 NOTE — Progress Notes (Signed)
This chaplain responded to unit page for EOL spiritual care.  The chaplain understands the Pt. has transitioned to comfort care. Family is at the bedside and appreciative of the chaplain visit. Family is trying to contact clergy for a visit. ? ?The Pt. preferred name is Kristen Murphy. The chaplain understands the Pt. is loved by her family and a multi season lover of the outdoors. The family finds comfort from the promise of no more suffering or pain for the Pt. Anticipating life without the Pt. is the place of anticipatory grief for the family.  ? ?The family accepted the chaplain invitation for prayer and F/U spiritual care. ? ?Chaplain Sallyanne Kuster ?[phone] ?

## 2021-12-06 NOTE — Progress Notes (Signed)
Patient ID: Kristen Murphy, female   DOB: 10-Jul-1966, 56 y.o.   MRN: [id number] ? ? ? ?Progress Note from the Palliative Medicine Team at [facility] ? ? ?Patient Name: Kristen Murphy        ?Date: 12/06/2021 ?DOB: 12-Feb-1966  Age: 56 y.o. MRN#: [id number] ?Attending Physician: Mendel Corning, MD ?Primary Care Physician: Gweneth Fritter, FNP ?Admit Date: 11/12/2021 ? ? ?Medical records reviewed  ? ?56 year old woman with a history of tobacco use, cervical cancer ([facility]), post nephrectomy and colostomy, CAD with LAD stent 01/2019, diabetes, depression, hypertension, remote CVA without known deficits.  Also with a history of substance abuse.  Brought in by her son to the ED 2/19 with fever, altered mental status and slurred speech. ? ?She had a flulike illness for a week, was found confused on 2/19.  Some suspicion by son that she may have been taking more prescription narcotics, also taking many NSAIDs per his report.  She was disoriented in the ED, febrile 103F, hyperglycemic. ? ?In the ED she apparently experienced acute hypoxemia, had some blood from her mouth, question emesis versus hemoptysis.  Devolved to PEA and required emergent ET intubation and CPR.  ROSC after 2 minutes.  ? ?Significant Hospital Events: ?  ?2/19 Intubated. Head CT  > no acute CT findings, old left basal ganglia lacunar, old left frontal cortical and subcortical infarct ?MRI brain 2/21 numerous lesions with mild associated hemorrhage and edema. Neurosurgery reviewed imaging and patient not a good candidate for lesion aspiration. ?2/24 MR Brain - Numerous lesions with mild associated hemorrhage. Mild edema. Concerned for brain abscesses ?2/22 LP consistent with bacterial meningitis. Respiratory Cx +MRSA ?2/25 MR Liver with no lesions, hypoperfusion to the subcapsular right hepatic lobe, favor benign vascular phenomena, distended gallbladder, persistent region of hypoperfusion. EEG and LTM 2/25 neg for seizures.  ?Repeat head CT 2/27 with no acute  changes  ?2/28 No acute events overnight, LTM read ? Seizure.  Cefepime switched to meropenem. ?3/2 LTM stopped. Meropenem changed to CTX. TEE + mobile mass on MV w/ mild to mod MR EF 60-65% ?3/3 change to IV heparin for possible trach  ?3/4 trach placed ?3/5  Seizure activity ?3/6 remains on LTM- no seizure to correlate with episodes of HR 130s, jaw clenching, urinary incontinence.  Linezolid changed to vanc per ID for longterm therapy, weaned x 8 hours, diuresing ?3/7 hypoglycemic overnight w/ held TF, PEG placed per IR, off propofol, LTM d/c'd, weaned x 8 hrs 8/8, 50% ?3/12 1. Numerous ring-enhancing lesions throughout the brain consistent with abscesses. These demonstrate increased enhancement, mildly increased size, and increased edema compared to the prior MRI. 2. Mild enhancement along the lateral ventricles suggesting persistent ventriculitis.3. Subacute right frontal, right parietal, and right occipital infarcts.4. Widespread cortical T2 hyperintensity and mild enhancement favored to reflect subacute changes of diffuse hypoxic-ischemic injury. ?3/13 morphine gtt started for comfort. Transitioning to comfort care ?  ? ?This NP visited patient at the bedside as a follow up for palliative medicine needs and emotional support.  Family made decision to shift to full comfort yesterday allowing for a natural death, focusing on comfort and dignity. ? ?Son, mother and two friends at the bedside.  ? ?Patient continues on morphine gtt with bolus option and Versed as needed.  Emotional support family at bedside. ? ?Education offered on the natural trajectory and expectations at end-of-life.  ?Family understand the limited prognosis of likely hours to days.     ? ?Questions and  concerns addressed    ? ?Patient transferred to 6 N, continue with end-of-life care.   Contacted by family with concerns that their family is being restricted to having only 3 family members in the room at a time.  This is upsetting and  concerning to them due to the fact that total of maximum 7 family members have held vigil at bedside since shift to comfort took place yesterday afternoon on 2H.    ? ?Discussed with bedside nursing, unit director/ Lanisha and AC/ Loralee Pacas --- visitation has been cleared for this patient at this time.  Family may have a total of 7 members at the bedside at one time.   ? ?Daughter shares that it likely will only be 3 family members through the night.  This family is very reasonable, however  they want the hospital to understand the importance of family presence not only for the patient but for each other/family  in an end-of-life situation.    ? ?Family need to be attending to their grief not concerning themselves with visitation policies. ? ?PMT will continue to support holistically. ? ?Wadie Lessen NP  ?Palliative Medicine Team Team Phone # [phone] ?Pager [phone] ?  ?

## 2021-12-06 NOTE — Progress Notes (Signed)
?      ? ? ? [facility] ?                                                                            ? ?Patient Demographics ? ?Kristen Murphy, is a 56 y.o. female, DOB - 03-22-1966, ENI:[id number] ? ?Admit date - 11/12/2021   Admitting Physician Collene Gobble, MD ? ?Outpatient Primary MD for the patient is Goins, Gillis Santa, FNP ? ?Outpatient specialists:  ? ?LOS - 24  days  ? ?Medical records reviewed and are as summarized below: ? ? ? ?Chief Complaint  ?Patient presents with  ? Altered Mental Status  ?    ? ?Brief summary  ? ?Per admission history by PCCM on 2/19 ?Patient is a 56 year old female with history of tobacco use, cervical CA, post nephrectomy and colostomy, CAD with LAD stenting 01/2019, diabetes, depression, hypertension, remote CVA, substance use, was brought in by her son to the ED on 2/19 with fever, altered mental status and slurred speech. ?Apparently, she had a flulike illness for a week, was found confused on 2/19.  There was some suspicion by son that she may have been taking more prescription narcotics, many NSAIDs.  She was disoriented in the ED, febrile 103 ?F, hyperglycemic.  In ED, she also experienced acute hypoxemia, had some blood from her mouth, emesis versus hemoptysis.  Reported it to PEA and required emergent ED intubation and CPR.  ROSC after 2 minutes, OG tube placed with bright red blood obtained.  Chest x-ray with mild right hilar prominence, more notable on postintubation films, no infiltrates or effusions. ?Received empiric antibiotics, packed RBC transfusion and admitted to ICU. ?2/19 Intubated. Head CT  > no acute CT findings, old left basal ganglia lacunar, old left frontal cortical and subcortical infarct ?MRI brain 2/21 numerous lesions with mild associated hemorrhage and edema. Neurosurgery reviewed imaging and patient not a good candidate for lesion aspiration. ?2/24 MR Brain - Numerous lesions with mild associated hemorrhage. Mild edema. Concerned for brain  abscesses ?2/22 LP consistent with bacterial meningitis. Respiratory Cx +MRSA ?2/25 MR Liver with no lesions, hypoperfusion to the subcapsular right hepatic lobe, favor benign vascular phenomena, distended gallbladder, persistent region of hypoperfusion. EEG and LTM 2/25 neg for seizures.  ?Repeat head CT 2/27 with no acute changes  ?2/28 No acute events overnight, LTM read ? Seizure.  Cefepime switched to meropenem. ?3/2 LTM stopped. Meropenem changed to CTX. TEE + mobile mass on MV w/ mild to mod MR EF 60-65% ?3/3 change to IV heparin for possible trach  ?3/4 trach placed ?3/5 ? Seizure activity, LTM restarted, weaned 8 hrs  ?3/6 remains on LTM- no seizure to correlate with episodes of HR 130s, jaw clenching, urinary incontinence.  Linezolid changed to vanc per ID for longterm therapy, weaned x 8 hours, diuresing ?3/7 hypoglycemic overnight w/ held TF, PEG placed per IR, off propofol, LTM d/c'd, weaned x 8 hrs 8/8, 50% ?3/9: Patient transferred to [facility], assumed care  ?3/14: Patient transitioned to comfort care ?3/15: patient to transfer to palliative floor  ? ? ? ?Assessment & Plan  ? ? ?Principal Problem: ?Sepsis with brain abscess, MRSA bacteremia, MV endocarditis, pulmonary spread ?-Prolonged  antibiotics per ID.  Remains on ceftriaxone/vancomycin (changed from linezolid 3/6 per ID), plan was for 8 weeks through April 16 or if repeat MRI shows clearance sooner. PICC could not be placed due to thrombi in both upper extremities.  ?- Repeat MRI brain on 3/12 showed numerous brain abscesses, increased enhancement, edema compared with previous MRI  ?-Palliative GOC meeting on 3/14, transitioned to full comfort care ? ? ?Active Problems: ?Acute hypoxemic respiratory failure ?-Due to necrotizing MRSA pneumonia ?-Appreciate CCM assistance, transition to full comfort care.   ?- Continue IV morphine and titrate as needed for comfort. ? ?Acute multifactorial encephalopathy ?-Secondary to #1, postarrest and  sepsis. ?-Underwent prolonged EEG, stopped 3/7 due to no seizure activity ?-Neurology following, continue Keppra, Vimpat, gabapentin at current dose.  ?-MRI brain repeated on 3/12 with numerous ring-enhancing lesions throughout the brain consistent with abscesses.  Increased enhancement and mildly increased size and increased edema compared to prior MRI.  Persistent ventriculitis, subacute right frontal right parietal and right occipital infarcts.  Widespread subacute changes of diffuse hypoxic-ischemic injury. ?-Transitioned to full comfort care ? ?PICC associated upper extremity DVT bilaterally ?-Continue peripheral IVs, was on IV heparin, transitioned to Eliquis x 3 months ?-DC'ed all meds not contributing to poor comfort ? ?Nutrition ?- PEG tube was placed on 3/7, patient was placed on tube feeds ?- now full comfort care  ? ? ?Diabetes mellitus, type II ?-comfort care  ? ?Essential hypertension ?-Continue comfort care status ? ?GOALS OF CARE: ?3/13 discussed MRI results and overnight issues with respiratory status with family.  Discussed goals of care, family leaning towards comfort care tomorrow.  ?3/14: Palliative GOC meeting.  Transitioned to comfort care ?3/15: transfer to palliative floor  ? ? ?Code Status: DNR ?DVT Prophylaxis:   ? ? ?Level of Care: Level of care: Palliative Care ?Family Communication: Discussed with patient's daughter, [name], son and another family member at bed side today ?Disposition Plan:     Status is: Inpatient ? ?Procedures:  ?Mechanical ventilation ?PEG tube placement ? ?Consultants:   ?Neurology ?Critical care ?Interventional radiology ?Palliative medicine ? ?Antimicrobials:  ?IV vancomycin ?IV ceftriaxone ? ? ?Medications ? ?Scheduled Meds: ? artificial tears   Both Eyes Q8H  ? mouth rinse  15 mL Mouth Rinse 10 times per day  ? ?Continuous Infusions: ? sodium chloride Stopped (11/21/21 1033)  ? sodium chloride Stopped (11/26/21 1648)  ? lacosamide (VIMPAT) IV Stopped (12/06/21  1053)  ? levETIRAcetam Stopped (12/06/21 4431)  ? morphine 12 mg/hr (12/06/21 1200)  ? ? ?Subjective:  ? ?Chrissa Ades was seen and examined today.  Full comfort care, on morphine drip. Family at bed side.  ? ? ?Objective:  ? ?Vitals:  ? 12/06/21 0540 12/06/21 0755 12/06/21 1221 12/06/21 1617  ?BP: (!) 129/54   113/67  ?Pulse: 85 97  (!) 105  ?Resp: '11 18  10  '$ ?Temp:      ?TempSrc:      ?SpO2: (!) 86% (!) 88% (!) 88% (!) 78%  ?Weight:      ?Height:      ? ? ?Intake/Output Summary (Last 24 hours) at 12/06/2021 1741 ?Last data filed at 12/06/2021 1200 ?Gross per 24 hour  ?Intake 508.94 ml  ?Output 475 ml  ?Net 33.94 ml  ? ? ? ?Wt Readings from Last 3 Encounters:  ?12/04/21 70 kg  ?01/06/21 82 kg  ?12/07/19 90.3 kg  ? ?Physical Exam ?General: unresponsive, comfortable   ?Cardiovascular: S1 S2 clear, RRR. No pedal edema b/l ?Respiratory:  CTAB, no wheezing, rales or rhonchi ?Gastrointestinal: ?Psych: appears comfortable on morphine drip  ? ? ?Radiology Reports ?CT HEAD WO CONTRAST (5MM) ? ?Result Date: 11/20/2021 ?IMPRESSION: 1. No change from recent head CT to explain the new deficit. 2. Known numerous brain lesions and subacute right cerebral infarcts. Electronically Signed   By: Jorje Guild M.D.   On: 11/20/2021 10:58  ? ?CT HEAD WO CONTRAST (5MM) ? ?Result Date: 11/19/2021 ? IMPRESSION: 1. Relatively well-defined focus of hypoattenuation in the superior, medial right frontal lobe and adjacent right parietal lobe, consistent with recent infarction, occurring since the brain MRI dated 11/14/2021. 2. Several small hypoattenuating lesions noted in the basal ganglia on the left, cerebral hemispheres and cerebellar hemispheres, corresponding to the lesions noted on the prior brain MRI. Differential diagnosis consistent with abscesses or metastatic disease. 3. Old left superior frontal lobe infarct stable from the prior head CT. Electronically Signed   By: Lajean Manes M.D.   On: 11/19/2021 14:32  ? ?CT HEAD WO CONTRAST  (5MM) ? IMPRESSION: No acute abnormality noted. No significant change from the prior exam. Electronically Signed   By: Inez Catalina M.D.   On: 11/12/2021 19:14  ? ?CT CHEST WO CONTRAST ? ?Result Date: 11/19/2021 ? IMPRESSION: 1.

## 2021-12-06 NOTE — Progress Notes (Signed)
Patient PR:FFMBWGY Kristen Murphy      DOB: 25-Jul-1966      KZL:[id number] ? ? ? ?  ?Palliative Medicine Team ? ? ? ? ?Subjective: Bedside symptom check. ? ?Physical exam: Multiple family members visiting in patient's room. Patient not interactive at this time, resting in the bed.  ? ?Assessment and plan: Patient appears comfortable, no physical signs of pain, distress, discomfort. Oxygen supplemented with trach collar, breathing even and nonlabored. Daughter Kristen Murphy bedside requests new supplies for ostomy bag exchange and possible transfer to 6N if able to find open bed accepting. This RN brought ostomy supplies back to the room for the staff RN to assist with a bag change. Family present declines any other assistance or needs at this time.  ? ? ? ?Thank you for allowing the Palliative Medicine Team to assist in the care of this patient. ?  ?  ?Damian Leavell, MSN, RN ?Palliative Medicine Team ?Team Phone: [phone]  ?This phone is monitored 7a-7p, please reach out to attending physician outside of these hours for urgent needs.   ?

## 2021-12-07 NOTE — Progress Notes (Addendum)
?      ? ? ? [facility] ?                                                                            ? ?Patient Demographics ? ?Kristen Murphy, is a 56 y.o. female, DOB - 10-Jul-1966, ZOX:[id number] ? ?Admit date - 11/12/2021   Admitting Physician Collene Gobble, MD ? ?Outpatient Primary MD for the patient is Goins, Gillis Santa, FNP ? ?Outpatient specialists:  ? ?LOS - 25  days  ? ?Medical records reviewed and are as summarized below: ? ? ? ?Chief Complaint  ?Patient presents with  ? Altered Mental Status  ?    ? ?Brief summary  ? ?Per admission history by PCCM on 2/19 ?Patient is a 56 year old female with history of tobacco use, cervical CA, post nephrectomy and colostomy, CAD with LAD stenting 01/2019, diabetes, depression, hypertension, remote CVA, substance use, was brought in by her son to the ED on 2/19 with fever, altered mental status and slurred speech. ?Apparently, she had a flulike illness for a week, was found confused on 2/19.  There was some suspicion by son that she may have been taking more prescription narcotics, many NSAIDs.  She was disoriented in the ED, febrile 103 ?F, hyperglycemic.  In ED, she also experienced acute hypoxemia, had some blood from her mouth, emesis versus hemoptysis.  Reported it to PEA and required emergent ED intubation and CPR.  ROSC after 2 minutes, OG tube placed with bright red blood obtained.  Chest x-ray with mild right hilar prominence, more notable on postintubation films, no infiltrates or effusions. ?Received empiric antibiotics, packed RBC transfusion and admitted to ICU. ?2/19 Intubated. Head CT  > no acute CT findings, old left basal ganglia lacunar, old left frontal cortical and subcortical infarct ?MRI brain 2/21 numerous lesions with mild associated hemorrhage and edema. Neurosurgery reviewed imaging and patient not a good candidate for lesion aspiration. ?2/24 MR Brain - Numerous lesions with mild associated hemorrhage. Mild edema. Concerned for brain  abscesses ?2/22 LP consistent with bacterial meningitis. Respiratory Cx +MRSA ?2/25 MR Liver with no lesions, hypoperfusion to the subcapsular right hepatic lobe, favor benign vascular phenomena, distended gallbladder, persistent region of hypoperfusion. EEG and LTM 2/25 neg for seizures.  ?Repeat head CT 2/27 with no acute changes  ?2/28 No acute events overnight, LTM read ? Seizure.  Cefepime switched to meropenem. ?3/2 LTM stopped. Meropenem changed to CTX. TEE + mobile mass on MV w/ mild to mod MR EF 60-65% ?3/3 change to IV heparin for possible trach  ?3/4 trach placed ?3/5 ? Seizure activity, LTM restarted, weaned 8 hrs  ?3/6 remains on LTM- no seizure to correlate with episodes of HR 130s, jaw clenching, urinary incontinence.  Linezolid changed to vanc per ID for longterm therapy, weaned x 8 hours, diuresing ?3/7 hypoglycemic overnight w/ held TF, PEG placed per IR, off propofol, LTM d/c'd, weaned x 8 hrs 8/8, 50% ?3/9: Patient transferred to [facility], assumed care  ?3/14: Patient transitioned to comfort care ?3/15: patient to transfer to palliative floor  ? ? ? ?Assessment & Plan  ? ? ?Principal Problem: ?Sepsis with brain abscess, MRSA bacteremia, MV endocarditis, pulmonary spread ?-Prolonged  antibiotics per ID.  Remains on ceftriaxone/vancomycin (changed from linezolid 3/6 per ID), plan was for 8 weeks through April 16 or if repeat MRI shows clearance sooner. PICC could not be placed due to thrombi in both upper extremities.  ?- Repeat MRI brain on 3/12 showed numerous brain abscesses, increased enhancement, edema compared with previous MRI  ?-Palliative GOC meeting on 3/14, transitioned to full comfort care ? ? ?Active Problems: ?Acute hypoxemic respiratory failure ?-Due to necrotizing MRSA pneumonia ?-Appreciate CCM assistance, transition to full comfort care.   ?- Continue IV morphine and titrate as needed for comfort. ? ?Acute multifactorial encephalopathy ?-Secondary to #1, postarrest and  sepsis. ?-Underwent prolonged EEG, stopped 3/7 due to no seizure activity ?-Neurology following, continue Keppra, Vimpat, gabapentin at current dose.  ?-MRI brain repeated on 3/12 with numerous ring-enhancing lesions throughout the brain consistent with abscesses.  Increased enhancement and mildly increased size and increased edema compared to prior MRI.  Persistent ventriculitis, subacute right frontal right parietal and right occipital infarcts.  Widespread subacute changes of diffuse hypoxic-ischemic injury. ?-Transitioned to full comfort care ? ?PICC associated upper extremity DVT bilaterally ?-Continue peripheral IVs, was on IV heparin, transitioned to Eliquis x 3 months ?-DC'ed all meds not contributing to poor comfort ? ?Nutrition ?- PEG tube was placed on 3/7, patient was placed on tube feeds ?- now full comfort care  ? ? ?Diabetes mellitus, type II ?-comfort care  ? ?Essential hypertension ?-Continue comfort care status ? ?GOALS OF CARE: ?3/13 discussed MRI results and overnight issues with respiratory status with family.  Discussed goals of care, family leaning towards comfort care tomorrow.  ?3/14: Palliative GOC meeting.  Transitioned to comfort care ?3/15: transfer to palliative floor  ? ? ?Code Status: DNR ?DVT Prophylaxis:   ? ? ?Level of Care: Level of care: Palliative Care ?Family Communication: Family at bedside. ?Disposition Plan:    Likely hospital death. ? ?Procedures:  ?Mechanical ventilation ?PEG tube placement ? ?Consultants:   ?Neurology ?Critical care ?Interventional radiology ?Palliative medicine ? ?Antimicrobials:  ?IV vancomycin ?IV ceftriaxone ? ? ?Medications ? ?Scheduled Meds: ? artificial tears   Both Eyes Q8H  ? mouth rinse  15 mL Mouth Rinse 10 times per day  ? ?Continuous Infusions: ? sodium chloride Stopped (11/21/21 1033)  ? sodium chloride Stopped (11/26/21 1648)  ? lacosamide (VIMPAT) IV 100 mg (12/07/21 1013)  ? levETIRAcetam 1,500 mg (12/07/21 6195)  ? morphine 14 mg/hr  (12/07/21 0926)  ? ? ?Subjective:  ? ?Kristen Murphy seen at her bedside.  Family present in the room, stating she is feeling pretty good right now. ? ? ?Objective:  ? ?Vitals:  ? 12/07/21 0222 12/07/21 0500 12/07/21 0814 12/07/21 1511  ?BP:  (!) 85/53    ?Pulse:    (!) 117  ?Resp:    (!) 6  ?Temp: (!) 101.9 ?F (38.8 ?C)  99.1 ?F (37.3 ?C)   ?TempSrc:   Axillary   ?SpO2:    (!) 64%  ?Weight:      ?Height:      ? ? ?Intake/Output Summary (Last 24 hours) at 12/07/2021 1544 ?Last data filed at 12/07/2021 0236 ?Gross per 24 hour  ?Intake 50 ml  ?Output --  ?Net 50 ml  ? ? ? ?Wt Readings from Last 3 Encounters:  ?12/04/21 70 kg  ?01/06/21 82 kg  ?12/07/19 90.3 kg  ? ?Physical Exam: No significant change from prior exam. ?General: unresponsive, comfortable   ?Cardiovascular: S1 S2 clear, RRR. No pedal edema b/l ?  Respiratory: CTAB, no wheezing, rales or rhonchi ?Gastrointestinal: ?Psych: appears comfortable on morphine drip  ? ? ?Radiology Reports ?CT HEAD WO CONTRAST (5MM) ? ?Result Date: 11/20/2021 ?IMPRESSION: 1. No change from recent head CT to explain the new deficit. 2. Known numerous brain lesions and subacute right cerebral infarcts. Electronically Signed   By: Jorje Guild M.D.   On: 11/20/2021 10:58  ? ?CT HEAD WO CONTRAST (5MM) ? ?Result Date: 11/19/2021 ? IMPRESSION: 1. Relatively well-defined focus of hypoattenuation in the superior, medial right frontal lobe and adjacent right parietal lobe, consistent with recent infarction, occurring since the brain MRI dated 11/14/2021. 2. Several small hypoattenuating lesions noted in the basal ganglia on the left, cerebral hemispheres and cerebellar hemispheres, corresponding to the lesions noted on the prior brain MRI. Differential diagnosis consistent with abscesses or metastatic disease. 3. Old left superior frontal lobe infarct stable from the prior head CT. Electronically Signed   By: Lajean Manes M.D.   On: 11/19/2021 14:32  ? ?CT HEAD WO CONTRAST (5MM) ?  IMPRESSION: No acute abnormality noted. No significant change from the prior exam. Electronically Signed   By: Inez Catalina M.D.   On: 11/12/2021 19:14  ? ?CT CHEST WO CONTRAST ? ?Result Date: 11/19/2021 ? IMPRESSION: 1. Since the prior ches

## 2021-12-07 NOTE — Progress Notes (Signed)
Patient ID: Kristen Murphy, female   DOB: 1965/10/04, 56 y.o.   MRN: [id number] ? ? ? ?Progress Note from the Palliative Medicine Team at [facility] ? ? ?Patient Name: Kristen Murphy        ?Date: 12/07/2021 ?DOB: 09-13-1966  Age: 56 y.o. MRN#: [id number] ?Attending Physician: Kayleen Memos, DO ?Primary Care Physician: Gweneth Fritter, FNP ?Admit Date: 11/12/2021 ? ? ?Medical records reviewed  ? ?56 year old woman with a history of tobacco use, cervical cancer ([facility]), post nephrectomy and colostomy, CAD with LAD stent 01/2019, diabetes, depression, hypertension, remote CVA without known deficits.    Brought in by her son to the ED 2/19 with fever, altered mental status and slurred speech. ? ?She had a flulike illness for a week, was found confused on 2/19.  Some suspicion by son that she may have been taking more prescription narcotics, also taking many NSAIDs per his report.  She was disoriented in the ED, febrile 103F, hyperglycemic. ? ?In the ED she apparently experienced acute hypoxemia, had some blood from her mouth, question emesis versus hemoptysis.  Devolved to PEA and required emergent ET intubation and CPR.  ROSC after 2 minutes.  ? ?Significant Hospital Events: ?  ?2/19 Intubated. Head CT  > no acute CT findings, old left basal ganglia lacunar, old left frontal cortical and subcortical infarct ?MRI brain 2/21 numerous lesions with mild associated hemorrhage and edema. Neurosurgery reviewed imaging and patient not a good candidate for lesion aspiration. ?2/24 MR Brain - Numerous lesions with mild associated hemorrhage. Mild edema. Concerned for brain abscesses ?2/22 LP consistent with bacterial meningitis. Respiratory Cx +MRSA ?2/25 MR Liver with no lesions, hypoperfusion to the subcapsular right hepatic lobe, favor benign vascular phenomena, distended gallbladder, persistent region of hypoperfusion. EEG and LTM 2/25 neg for seizures.  ?Repeat head CT 2/27 with no acute changes  ?2/28 No acute events overnight,  LTM read ? Seizure.  Cefepime switched to meropenem. ?3/2 LTM stopped. Meropenem changed to CTX. TEE + mobile mass on MV w/ mild to mod MR EF 60-65% ?3/3 change to IV heparin for possible trach  ?3/4 trach placed ?3/5  Seizure activity ?3/6 remains on LTM- no seizure to correlate with episodes of HR 130s, jaw clenching, urinary incontinence.  Linezolid changed to vanc per ID for longterm therapy, weaned x 8 hours, diuresing ?3/7 hypoglycemic overnight w/ held TF, PEG placed per IR, off propofol, LTM d/c'd, weaned x 8 hrs 8/8, 50% ?3/12 1. Numerous ring-enhancing lesions throughout the brain consistent with abscesses. These demonstrate increased enhancement, mildly increased size, and increased edema compared to the prior MRI. 2. Mild enhancement along the lateral ventricles suggesting persistent ventriculitis.3. Subacute right frontal, right parietal, and right occipital infarcts.4. Widespread cortical T2 hyperintensity and mild enhancement favored to reflect subacute changes of diffuse hypoxic-ischemic injury. ?3/13 morphine gtt started for comfort. Transitioning to comfort care ?3/16 continues  to transition slowly at EOL ?  ? ?This NP visited patient at the bedside as a follow up for palliative medicine needs and emotional support.  Family made decision to shift to full comfort allowing for a natural death, focusing on comfort and dignity. ? ?Daughter at the bedside, she has kept vigil staying at her mother's side. She wonders about patient's transition timing.  Education offered on the unknown of death even in her mother's complex medical situation over the past several  weeks.  It will happen in due time, supporting patient along the journey. ?Education offered on the  natural trajectory and expectations at end-of-life.  ? ?Patient continues on morphine gtt with bolus option and Versed as needed.  Emotional support family at bedside. ? ?Family understand the limited prognosis remains hours to days.      ? ?Questions and concerns addressed    ? ?This nurse practitioner informed  the daughter/Christy  I will be out of the hospital next week.  PMT will continue to follow and support ? ?Call palliative medicine team phone # [phone] with questions or concerns.  ? ? ?Wadie Lessen NP  ?Palliative Medicine Team Team Phone # [phone] ?Pager [phone] ?  ?

## 2021-12-07 NOTE — Progress Notes (Signed)
This chaplain responded to PMT referral for EOL spiritual care. This visit is a F/U. The Pt. daughter-Chrissy and other family are present at the bedside. ? ?The sacred space of storytelling was open to the family as the chaplain assisted the family in making hand prints of the Pt. The family is appreciative of the Pt. bath and shares the Pt. is resting comfortably. The chaplain understands the family is finding comfort in their anticipatory grief through the Pt. peace with death. ? ?The chaplain echoed the Pt. faith in the prayer. ? ?Chaplain Sallyanne Kuster ?[phone] ?

## 2021-12-08 DIAGNOSIS — Z66 Do not resuscitate: Secondary | ICD-10-CM

## 2021-12-08 DIAGNOSIS — R0600 Dyspnea, unspecified: Secondary | ICD-10-CM

## 2021-12-08 DIAGNOSIS — Z789 Other specified health status: Secondary | ICD-10-CM

## 2021-12-08 MED ORDER — GLYCOPYRROLATE 0.2 MG/ML IJ SOLN
0.2000 mg | INTRAMUSCULAR | Status: DC | PRN
  Administered 2021-12-08 – 2021-12-10 (×10): 0.2 mg via INTRAVENOUS
  Filled 2021-12-08 (×10): qty 1

## 2021-12-08 MED ORDER — GLYCOPYRROLATE 0.2 MG/ML IJ SOLN: 0.2000 mg | INTRAMUSCULAR | Status: DC

## 2021-12-08 MED ORDER — MORPHINE BOLUS VIA INFUSION
3.0000 mg | INTRAVENOUS | Status: DC | PRN
  Administered 2021-12-08 – 2021-12-09 (×2): 3 mg via INTRAVENOUS
  Filled 2021-12-08: qty 5

## 2021-12-08 MED ORDER — GLYCOPYRROLATE 0.2 MG/ML IJ SOLN
0.2000 mg | INTRAMUSCULAR | Status: DC
  Administered 2021-12-08 (×2): 0.2 mg via INTRAVENOUS
  Filled 2021-12-08 (×2): qty 1

## 2021-12-08 MED ORDER — GLYCOPYRROLATE 0.2 MG/ML IJ SOLN: 0.2000 mg | INTRAMUSCULAR | Status: DC | PRN

## 2021-12-08 MED ORDER — MIDAZOLAM HCL 2 MG/2ML IJ SOLN
2.0000 mg | INTRAMUSCULAR | Status: DC
  Administered 2021-12-08 (×4): 2 mg via INTRAVENOUS
  Filled 2021-12-08 (×4): qty 2

## 2021-12-08 MED ORDER — GLYCOPYRROLATE 1 MG PO TABS
1.0000 mg | ORAL_TABLET | ORAL | Status: DC
  Filled 2021-12-08 (×4): qty 1

## 2021-12-08 MED ORDER — MIDAZOLAM-SODIUM CHLORIDE 100-0.9 MG/100ML-% IV SOLN
1.0000 mg/h | INTRAVENOUS | Status: DC
  Administered 2021-12-08: 1 mg/h via INTRAVENOUS
  Filled 2021-12-08: qty 100

## 2021-12-08 MED ORDER — GLYCOPYRROLATE 1 MG PO TABS: 1.0000 mg | ORAL_TABLET | ORAL | Status: DC | PRN

## 2021-12-08 NOTE — Progress Notes (Signed)
? ?                                                                                                                                                     ?                                                   ?Daily Progress Note  ? ?Patient Name: Kristen Murphy       Date: 12/08/2021 ?DOB: 08-Sep-1966  Age: 56 y.o. MRN#: [id number] ?Attending Physician: Kayleen Memos, DO ?Primary Care Physician: Gweneth Fritter, FNP ?Admit Date: 11/12/2021 ? ?Reason for Consultation/Follow-up: Disposition, Establishing goals of care, Non pain symptom management, Pain control, Psychosocial/spiritual support, and Terminal Care ? ?Subjective: ?Received notification from PMT RN that family had questions about possibility of residential hospice transfer.  ? ?Chart review performed. Patient is currently receiving continuous morphine at '15mg'$ /hr with scheduled versed '2mg'$  q2h. Received report from primary RN - no acute concerns.  ? ?Went to visit patient at bedside - Linda/mother, Pam/sister, Alecia/sister, Anthony/son, Franklin/brother present. Patient was lying in bed - she does not wake to voice/gentle touch. No signs or non-verbal gestures of pain or discomfort noted. No respiratory distress, increased work of breathing, or secretions noted.  ? ?Answered family's questions about possibility of a residential hospice transfer. Family live in [place] and would prefer patient be closer to them if she is not likely to pass soon. Prognostication reviewed to still be hours-days. At this time, patient does appear stable for transfer, but considering her hight symptom management needs transfer does remain risky. Discussed transfer process to include pre-medicating for transfer to ensure comfort throughout travel. Discussed that residential hospice referral could be cancelled (would anticipate hospital death) at any time if patient's condition changed and it was felt they were too unstable for transfer. Reviewed patient's insurance in context of hospice  coverage to the best of my ability per their request. Family will discuss information with patient's daughter/Christy tonight and request follow up visit tomorrow for their decision. ? ?All questions and concerns addressed. Encouraged to call with questions and/or concerns. PMT number provided. ? ? ? ?Length of Stay: 26 ? ?Current Medications: ?Scheduled Meds:  ? artificial tears   Both Eyes Q8H  ? glycopyrrolate  1 mg Per Tube Q4H  ? Or  ? glycopyrrolate  0.2 mg Subcutaneous Q4H  ? Or  ? glycopyrrolate  0.2 mg Intravenous Q4H  ? mouth rinse  15 mL Mouth Rinse 10 times per day  ? midazolam  2 mg Intravenous Q2H  ? ? ?Continuous Infusions: ? sodium chloride Stopped (11/21/21 1033)  ? sodium chloride Stopped (11/26/21 1648)  ? lacosamide (  VIMPAT) IV 100 mg (12/08/21 1142)  ? levETIRAcetam 1,500 mg (12/08/21 0935)  ? morphine 15 mg/hr (12/08/21 1303)  ? ? ?PRN Meds: ?sodium chloride, [DISCONTINUED] acetaminophen **OR** acetaminophen (TYLENOL) oral liquid 160 mg/5 mL **OR** [DISCONTINUED] acetaminophen, diphenhydrAMINE, lip balm, morphine, polyvinyl alcohol ? ?Physical Exam ?Vitals and nursing note reviewed.  ?Constitutional:   ?   General: She is not in acute distress. ?   Appearance: She is ill-appearing.  ?Pulmonary:  ?   Effort: No respiratory distress.  ?Skin: ?   General: Skin is warm and dry.  ?Neurological:  ?   Mental Status: She is unresponsive.  ?   Motor: Weakness present.  ?Psychiatric:     ?   Speech: She is noncommunicative.  ?         ? ?Vital Signs: BP 98/64 (BP Location: Right Arm)   Pulse (!) 111   Temp 99.1 ?F (37.3 ?C) (Axillary)   Resp (!) 6   Ht '5\' 2"'$  (1.575 m)   Wt 70 kg   SpO2 (!) 76% Comment: comfort care  BMI 28.23 kg/m?  ?SpO2: SpO2: (!) 76 % (comfort care) ?O2 Device: O2 Device: Tracheostomy Collar ?O2 Flow Rate: O2 Flow Rate (L/min): 5 L/min ? ?Intake/output summary:  ?Intake/Output Summary (Last 24 hours) at 12/08/2021 1509 ?Last data filed at 12/08/2021 1345 ?Gross per 24 hour   ?Intake 0 ml  ?Output 900 ml  ?Net -900 ml  ? ?LBM: Last BM Date : 12/06/21 ?Baseline Weight: Weight: 75.6 kg ?Most recent weight: Weight: 70 kg ? ?     ?Palliative Assessment/Data: PPS 10% ? ? ? ? ? ?Patient Active Problem List  ? Diagnosis Date Noted  ? MRSA pneumonia ([place]) 11/15/2021  ? Brain abscess 11/15/2021  ? Cardiac arrest ([place]) 11/12/2021  ? Hematemesis   ? Respiratory failure ([place])   ? Sepsis ([place])   ? Stroke [facility])   ? Seizures ([place])   ? Hypertension   ? Heart murmur   ? Headache   ? Dyspnea   ? Diabetes mellitus without complication ([place])   ? Depression   ? Chronic kidney disease   ? Asthma   ? Coronary artery disease   ? Arthritis   ? Anxiety   ? Anemia   ? Cervical cancer ([place]) 01/26/2020  ? CAD in native artery 02/27/2019  ? Dyslipidemia (high LDL; low HDL) 02/27/2019  ? Non-ST elevation (NSTEMI) myocardial infarction ([place]) 02/18/2019  ? NSTEMI (non-ST elevated myocardial infarction) ([place]) 02/18/2019  ? HTN (hypertension) 02/20/2017  ? DM (diabetes mellitus) ([place]) 02/20/2017  ? Cerebrovascular disease 02/20/2017  ? Tobacco use disorder 02/20/2017  ? Opioid use disorder, severe, dependence ([place]) 02/20/2017  ? Sedative, hypnotic or anxiolytic use disorder, severe, dependence ([place]) 02/20/2017  ? Sedative hypnotic withdrawal ([place]) 02/20/2017  ? Methamphetamine use disorder, moderate ([place]) 02/20/2017  ? Alcohol use disorder, moderate, dependence ([place]) 02/20/2017  ? MDD (major depressive disorder) 02/19/2017  ? ? ?Palliative Care Assessment & Plan  ? ?Patient Profile: ?56 year old woman with a history of tobacco use, cervical cancer ([facility]), post nephrectomy and colostomy, CAD with LAD stent 01/2019, diabetes, depression, hypertension, remote CVA without known deficits.  Also with a history of substance abuse.  Brought in by her son to the ED 2/19 with fever, altered mental status and slurred speech. ? ?She had a flulike illness for a week, was found confused on 2/19.  Some suspicion by son that she may have  been taking more prescription narcotics, also taking many  NSAIDs per his report.  She was disoriented in the ED, febrile 103F, hyperglycemic. ?  ?In the ED she apparently experienced acute hypoxemia, had some blood from her mouth, question emesis versus hemoptysis.  Devolved to PEA and required emergent ET intubation and CPR.  ROSC after 2 minutes.  ? ?Assessment: ?Sepsis with brain abcess ?MRSA bacteremia ?MV endocarditis ?Acute hypoxemic respiratory failure ?Acute multifactorial encephalopathy ?Terminal care ? ?Recommendations/Plan: ?Continue full comfort measures ?Continue DNR/DNI as previously documented ?Family considering transferring patient to residential hospice facility closer to where they live. PMT will follow up with family tomorrow morning 3/18  ?Started versed continuous infusion '1mg'$ /hr for better steady state; discontinued scheduled versed '2mg'$  q2h ?Since patient requiring high continuous dose of morphine, increased bolus dose of '1mg'$  to range 3-'5mg'$  q60mns for breakthrough symptoms ?Continue other comfort focused medication regimen ?PMT will continue to follow and support holistically ? ?Goals of Care and Additional Recommendations: ?Limitations on Scope of Treatment: Full Comfort Care ? ?Code Status: ? ?  ?Code Status Orders  ?(From admission, onward)  ?  ? ? ?  ? ?  Start     Ordered  ? 12/05/21 1218  DNR (Do not attempt resuscitation)  Continuous       ?Question Answer Comment  ?In the event of cardiac or respiratory ARREST Do not call a ?code blue?   ?In the event of cardiac or respiratory ARREST Do not perform Intubation, CPR, defibrillation or ACLS   ?In the event of cardiac or respiratory ARREST Use medication by any route, position, wound care, and other measures to relive pain and suffering. May use oxygen, suction and manual treatment of airway obstruction as needed for comfort.   ?  ? 12/05/21 1218  ? ?  ?  ? ?  ? ?Code Status History   ? ? Date Active Date Inactive Code Status Order ID  Comments User Context  ? 12/03/2021 1433 12/05/2021 1218 DNR [id number] FRosezella Rumpf NP Inpatient  ? 11/12/2021 1839 12/03/2021 1433 Full Code [id number] SArnell Asal NP ED  ? 02/18/2019 193795/29/2020 1547

## 2021-12-08 NOTE — Progress Notes (Addendum)
Patient Kristen Murphy      DOB: 1966-08-07      TKP:[id number] ? ? ? ?  ?Palliative Medicine Team ? ? ? ? ?Subjective: Bedside symptom check. Long time friend Pam bedside with the patient.  ? ? ?Physical exam: Patient comfortably resting in bed with eyes closed. No physical signs of pain, distress, or discomfort observed. Breathing was even, nonlabored, deep. Trach attached to humidified medical air. No excessive secretions noted. Patient warm to the touch in all extremities, palpable pulses distally, no discoloration noted.  ? ? ?Assessment and plan: Will return bedside to speak with daughter when she returns this afternoon. Continue current medications to ensure comfort. No new needs or concerns noted from visitor or bedside nurse at this time.  ? ? ?Addendum: Returned to patient's room to touch base with family at 14:30. Five family members present, all but daughter Kristen Murphy. This RN was available to listen and answer all questions presented. Family brought up the idea of hospice and how it differed from the care they were receiving inpatient. This RN provided information and educated on the process to transfer to residential hospice if that was something they were interested in. They said they were interested in more information as their mother was holding strong on large amounts of medication to support her comfort. This RN reached out to PMT NP for additional support and referral information. Family clear to wait for daughter to be present for all decision making but open to all options and information. Will continue to follow for all needs and advancements. ? ?Thank you for allowing the Palliative Medicine Team to assist in the care of this patient. ?  ?  ?Damian Leavell, MSN, RN ?Palliative Medicine Team ?Team Phone: [phone]  ?This phone is monitored 7a-7p, please reach out to attending physician outside of these hours for urgent needs.   ?

## 2021-12-08 NOTE — Progress Notes (Addendum)
Call received from infection control rep this AM requesting that bactroban ointment, per protocol, be ordered as pt tested positive for MRSA and it was noted anywhere in chart where pt had received treatment. Discussed with pt's daughter at bedside, who stated that she did not want drug ordered for pt.  ?

## 2021-12-08 NOTE — Progress Notes (Signed)
?      ? ? ? [facility] ?                                                                            ? ?Patient Demographics ? ?Kristen Murphy, is a 56 y.o. female, DOB - [birth year], LYY:[id number] ? ?Admit date - 11/12/2021   Admitting Physician Collene Gobble, MD ? ?Outpatient Primary MD for the patient is Goins, Gillis Santa, FNP ? ?Outpatient specialists:  ? ?LOS - 26  days  ? ?Medical records reviewed and are as summarized below: ? ? ? ?Chief Complaint  ?Patient presents with  ? Altered Mental Status  ?    ? ?Brief summary  ? ?Per admission history by PCCM on 2/19 ?Patient is a 56 year old female with history of tobacco use, cervical CA, post nephrectomy and colostomy, CAD with LAD stenting 01/2019, diabetes, depression, hypertension, remote CVA, substance use, was brought in by her son to the ED on 2/19 with fever, altered mental status and slurred speech. ?Apparently, she had a flulike illness for a week, was found confused on 2/19.  There was some suspicion by son that she may have been taking more prescription narcotics, many NSAIDs.  She was disoriented in the ED, febrile 103 ?F, hyperglycemic.  In ED, she also experienced acute hypoxemia, had some blood from her mouth, emesis versus hemoptysis.  Reported it to PEA and required emergent ED intubation and CPR.  ROSC after 2 minutes, OG tube placed with bright red blood obtained.  Chest x-ray with mild right hilar prominence, more notable on postintubation films, no infiltrates or effusions. ?Received empiric antibiotics, packed RBC transfusion and admitted to ICU. ?2/19 Intubated. Head CT  > no acute CT findings, old left basal ganglia lacunar, old left frontal cortical and subcortical infarct ?MRI brain 2/21 numerous lesions with mild associated hemorrhage and edema. Neurosurgery reviewed imaging and patient not a good candidate for lesion aspiration. ?2/24 MR Brain - Numerous lesions with mild associated hemorrhage. Mild edema. Concerned for brain  abscesses ?2/22 LP consistent with bacterial meningitis. Respiratory Cx +MRSA ?2/25 MR Liver with no lesions, hypoperfusion to the subcapsular right hepatic lobe, favor benign vascular phenomena, distended gallbladder, persistent region of hypoperfusion. EEG and LTM 2/25 neg for seizures.  ?Repeat head CT 2/27 with no acute changes  ?2/28 No acute events overnight, LTM read ? Seizure.  Cefepime switched to meropenem. ?3/2 LTM stopped. Meropenem changed to CTX. TEE + mobile mass on MV w/ mild to mod MR EF 60-65% ?3/3 change to IV heparin for possible trach  ?3/4 trach placed ?3/5 ? Seizure activity, LTM restarted, weaned 8 hrs  ?3/6 remains on LTM- no seizure to correlate with episodes of HR 130s, jaw clenching, urinary incontinence.  Linezolid changed to vanc per ID for longterm therapy, weaned x 8 hours, diuresing ?3/7 hypoglycemic overnight w/ held TF, PEG placed per IR, off propofol, LTM d/c'd, weaned x 8 hrs 8/8, 50% ?3/9: Patient transferred to [facility], assumed care  ?3/14: Patient transitioned to comfort care ?3/15: patient to transfer to palliative floor  ? ? ? ?Assessment & Plan  ? ? ?Principal Problem: ?Sepsis with brain abscess, MRSA bacteremia, MV endocarditis, pulmonary spread ?-Prolonged  antibiotics per ID.  Remains on ceftriaxone/vancomycin (changed from linezolid 3/6 per ID), plan was for 8 weeks through April 16 or if repeat MRI shows clearance sooner. PICC could not be placed due to thrombi in both upper extremities.  ?- Repeat MRI brain on 3/12 showed numerous brain abscesses, increased enhancement, edema compared with previous MRI  ?-Palliative GOC meeting on 3/14, transitioned to full comfort care ? ? ?Active Problems: ?Acute hypoxemic respiratory failure ?-Due to necrotizing MRSA pneumonia ?-Appreciate CCM assistance, transition to full comfort care.   ?- Continue IV morphine and titrate as needed for comfort. ? ?Acute multifactorial encephalopathy ?-Secondary to #1, postarrest and  sepsis. ?-Underwent prolonged EEG, stopped 3/7 due to no seizure activity ?-Neurology following, continue Keppra, Vimpat, gabapentin at current dose.  ?-MRI brain repeated on 3/12 with numerous ring-enhancing lesions throughout the brain consistent with abscesses.  Increased enhancement and mildly increased size and increased edema compared to prior MRI.  Persistent ventriculitis, subacute right frontal right parietal and right occipital infarcts.  Widespread subacute changes of diffuse hypoxic-ischemic injury. ?-Transitioned to full comfort care ? ?PICC associated upper extremity DVT bilaterally ?-Continue peripheral IVs, was on IV heparin, transitioned to Eliquis x 3 months ?-DC'ed all meds not contributing to poor comfort ? ?Nutrition ?- PEG tube was placed on 3/7, patient was placed on tube feeds ?- now full comfort care  ? ? ?Diabetes mellitus, type II ?-comfort care  ? ?Essential hypertension ?-Continue comfort care status ? ?GOALS OF CARE: ?3/13 discussed MRI results and overnight issues with respiratory status with family.  Discussed goals of care, family leaning towards comfort care tomorrow.  ?3/14: Palliative GOC meeting.  Transitioned to comfort care ?3/15: transfer to palliative floor  ? ? ?Code Status: DNR ?DVT Prophylaxis:   ? ? ?Level of Care: Level of care: Palliative Care ?Family Communication: Family at bedside. ?Disposition Plan:    Likely hospital death. ? ?Procedures:  ?Mechanical ventilation ?PEG tube placement ? ?Consultants:   ?Neurology ?Critical care ?Interventional radiology ?Palliative medicine ? ?Antimicrobials:  ?IV vancomycin ?IV ceftriaxone ? ? ?Medications ? ?Scheduled Meds: ? artificial tears   Both Eyes Q8H  ? glycopyrrolate  1 mg Per Tube Q4H  ? Or  ? glycopyrrolate  0.2 mg Subcutaneous Q4H  ? Or  ? glycopyrrolate  0.2 mg Intravenous Q4H  ? mouth rinse  15 mL Mouth Rinse 10 times per day  ? ?Continuous Infusions: ? sodium chloride Stopped (11/21/21 1033)  ? sodium chloride  Stopped (11/26/21 1648)  ? lacosamide (VIMPAT) IV 100 mg (12/08/21 1142)  ? levETIRAcetam 1,500 mg (12/08/21 0935)  ? midazolam    ? morphine 15 mg/hr (12/08/21 1303)  ? ? ?Subjective:  ? ?[name] the patient was seen and examined at her bedside this morning.  Her daughter who is also an Therapist, sports was present in the room.  Patient's daughter request that we give her Valium every 2 hours and her Robinul every 4 hours scheduled.  Defer further management to palliative care team. ? ? ?Objective:  ? ?Vitals:  ? 12/07/21 1954 12/07/21 2255 12/08/21 0842 12/08/21 1156  ?BP:  98/64    ?Pulse: (!) 106 (!) 114  (!) 111  ?Resp: (!) 8 (!) 8 (!) 8 (!) 6  ?Temp:      ?TempSrc:  Oral    ?SpO2: (!) 66% (!) 77%  (!) 76%  ?Weight:      ?Height:      ? ? ?Intake/Output Summary (Last 24 hours) at 12/08/2021  1547 ?Last data filed at 12/08/2021 1345 ?Gross per 24 hour  ?Intake 0 ml  ?Output 900 ml  ?Net -900 ml  ? ? ? ?Wt Readings from Last 3 Encounters:  ?12/04/21 70 kg  ?01/06/21 82 kg  ?12/07/19 90.3 kg  ? ?Physical Exam: No significant change from prior exam. ?General: unresponsive, comfortable   ?Cardiovascular: S1 S2 clear, RRR. No pedal edema b/l ?Respiratory: CTAB, no wheezing, rales or rhonchi ?Gastrointestinal: ?Psych: appears comfortable on morphine drip  ? ? ?Radiology Reports ?CT HEAD WO CONTRAST (5MM) ? ?Result Date: 11/20/2021 ?IMPRESSION: 1. No change from recent head CT to explain the new deficit. 2. Known numerous brain lesions and subacute right cerebral infarcts. Electronically Signed   By: Jorje Guild M.D.   On: 11/20/2021 10:58  ? ?CT HEAD WO CONTRAST (5MM) ? ?Result Date: 11/19/2021 ? IMPRESSION: 1. Relatively well-defined focus of hypoattenuation in the superior, medial right frontal lobe and adjacent right parietal lobe, consistent with recent infarction, occurring since the brain MRI dated 11/14/2021. 2. Several small hypoattenuating lesions noted in the basal ganglia on the left, cerebral hemispheres and  cerebellar hemispheres, corresponding to the lesions noted on the prior brain MRI. Differential diagnosis consistent with abscesses or metastatic disease. 3. Old left superior frontal lobe infarct stable from the prior head CT. Electronic

## 2021-12-09 MED ORDER — FENTANYL 2500MCG IN NS 250ML (10MCG/ML) PREMIX INFUSION
0.0000 ug/h | INTRAVENOUS | Status: DC
  Administered 2021-12-09 – 2021-12-10 (×2): 150 ug/h via INTRAVENOUS
  Filled 2021-12-09 (×2): qty 250

## 2021-12-09 MED ORDER — FENTANYL BOLUS VIA INFUSION
75.0000 ug | INTRAVENOUS | Status: DC | PRN
  Filled 2021-12-09: qty 75

## 2021-12-09 MED ORDER — BIOTENE DRY MOUTH MT LIQD
15.0000 mL | Freq: Two times a day (BID) | OROMUCOSAL | Status: DC
  Administered 2021-12-09 – 2021-12-10 (×2): 15 mL via OROMUCOSAL

## 2021-12-09 NOTE — Progress Notes (Signed)
?      ? ? ? [facility] ?                                                                            ? ?Patient Demographics ? ?Kristen [facility], is a 56 y.o. female, DOB - [birth year], JOI:[id number] ? ?Admit date - 11/12/2021   Admitting Physician Kristen Gobble, MD ? ?Outpatient Primary MD for the patient is Goins, Kristen Santa, FNP ? ?Outpatient specialists:  ? ?LOS - 27  days  ? ?Medical records reviewed and are as summarized below: ? ? ? ?Chief Complaint  ?Patient presents with  ? Altered Mental Status  ?    ? ?Brief summary  ? ?Per admission history by PCCM on 11/12/21 ?Patient is a 56 year old female with history of tobacco use, cervical CA, post nephrectomy and colostomy, CAD with LAD stenting 01/2019, type 2 diabetes, depression, hypertension, remote CVA, substance use, was brought in by her son to the ED on 2/19 with fever, altered mental status and slurred speech. ?Apparently, she had a flulike illness for a week, was found confused on 11/12/21.  There was some suspicion by son that she may have been taking more prescription narcotics, many NSAIDs.  She was disoriented in the ED, febrile 103 ?F, hyperglycemic.  In ED, she also experienced acute hypoxemia, had some blood from her mouth, emesis versus hemoptysis.  Reported PEA and required emergent ED intubation and CPR.  ROSC after 2 minutes, OG tube placed with bright red blood obtained.  Chest x-ray with mild right hilar prominence, more notable on postintubation films, no infiltrates or effusions. ?Received empiric antibiotics, packed RBCs transfusion and admitted to ICU. ?2/19 Intubated. Head CT  > no acute CT findings, old left basal ganglia lacunar, old left frontal cortical and subcortical infarct ?MRI brain 2/21 numerous lesions with mild associated hemorrhage and edema. Neurosurgery reviewed imaging and patient not a good candidate for lesion aspiration. ?2/24 MR Brain - Numerous lesions with mild associated hemorrhage. Mild edema. Concerned for brain  abscesses ?2/22 LP consistent with bacterial meningitis. Respiratory Cx +MRSA ?2/25 MR Liver with no lesions, hypoperfusion to the subcapsular right hepatic lobe, favor benign vascular phenomena, distended gallbladder, persistent region of hypoperfusion. EEG and LTM 2/25 neg for seizures.  ?Repeat head CT 2/27 with no acute changes  ?2/28 No acute events overnight, LTM read ? Seizure.  Cefepime switched to meropenem. ?3/2 LTM stopped. Meropenem changed to CTX. TEE + mobile mass on MV w/ mild to mod MR EF 60-65% ?3/3 change to IV heparin for possible trach  ?3/4 trach placed ?3/5 ? Seizure activity, LTM restarted, weaned 8 hrs  ?3/6 remains on LTM- no seizure to correlate with episodes of HR 130s, jaw clenching, urinary incontinence.  Linezolid changed to vanc per ID for longterm therapy, weaned x 8 hours, diuresing ?3/7 hypoglycemic overnight w/ held TF, PEG placed per IR, off propofol, LTM d/c'd, weaned x 8 hrs 8/8, 50% ?3/9: Patient transferred to [facility], assumed care  ?3/14: Patient transitioned to comfort care ?3/18: Family requests transfer to hospice residence, possible discharge on 12/10/2021 ? ? ? ?Assessment & Plan  ? ?Sepsis with brain abscess, MRSA bacteremia, MV endocarditis, pulmonary spread ? ?  Acute hypoxemic respiratory failure ? ?Acute multifactorial encephalopathy ? ?PICC associated upper extremity DVT bilaterally ? ?Nutrition ? ?Diabetes mellitus, type II ? ?Essential hypertension ? ?DNR/comfort care ? ? ?Code Status: DNR ?DVT Prophylaxis:    Comfort care measures. ? ? ?Level of Care: Level of care: Palliative Care ?Family Communication: No family members at bedside this morning. ?Disposition Plan:    Possible residence hospice. ? ?Procedures:  ?Mechanical ventilation ?PEG tube placement ? ?Consultants:   ?Neurology ?Critical care ?Interventional radiology ?Palliative medicine ? ?Antimicrobials:  ?IV vancomycin ?IV ceftriaxone ? ? ?Medications ? ?Scheduled Meds: ? antiseptic oral rinse  15 mL Mouth  Rinse BID  ? artificial tears   Both Eyes Q8H  ? ?Continuous Infusions: ? fentaNYL infusion INTRAVENOUS 150 mcg/hr (12/09/21 1528)  ? lacosamide (VIMPAT) IV 100 mg (12/09/21 1015)  ? levETIRAcetam 1,500 mg (12/09/21 0943)  ? midazolam 1 mg/hr (12/09/21 0800)  ? ? ?Subjective:  ? ?[name] patient unresponsive, in no apparent acute distress. ? ?Objective:  ? ?Vitals:  ? 12/08/21 1156 12/08/21 1535 12/08/21 2112 12/09/21 0400  ?BP:      ?Pulse: (!) 111 (!) 109    ?Resp: (!) 6 (!) '6 10 10  '$ ?Temp:      ?TempSrc:      ?SpO2: (!) 76% (!) 61%    ?Weight:      ?Height:      ? ? ?Intake/Output Summary (Last 24 hours) at 12/09/2021 1608 ?Last data filed at 12/09/2021 0800 ?Gross per 24 hour  ?Intake 1663.88 ml  ?Output 300 ml  ?Net 1363.88 ml  ? ? ? ?Wt Readings from Last 3 Encounters:  ?12/04/21 70 kg  ?01/06/21 82 kg  ?12/07/19 90.3 kg  ? ?Physical Exam: No significant change from prior exam. ?General: Unresponsive, comfortable   ?Cardiovascular: S1 S2 clear, RRR. No pedal edema b/l ?Respiratory: CTAB, no wheezing, rales or rhonchi ?Psych: Appears comfortable on morphine drip  ? ? ?Radiology Reports ?CT HEAD WO CONTRAST (5MM) ? ?Result Date: 11/20/2021 ?IMPRESSION: 1. No change from recent head CT to explain the new deficit. 2. Known numerous brain lesions and subacute right cerebral infarcts. Electronically Signed   By: Jorje Guild M.D.   On: 11/20/2021 10:58  ? ?CT HEAD WO CONTRAST (5MM) ? ?Result Date: 11/19/2021 ? IMPRESSION: 1. Relatively well-defined focus of hypoattenuation in the superior, medial right frontal lobe and adjacent right parietal lobe, consistent with recent infarction, occurring since the brain MRI dated 11/14/2021. 2. Several small hypoattenuating lesions noted in the basal ganglia on the left, cerebral hemispheres and cerebellar hemispheres, corresponding to the lesions noted on the prior brain MRI. Differential diagnosis consistent with abscesses or metastatic disease. 3. Old left superior  frontal lobe infarct stable from the prior head CT. Electronically Signed   By: Lajean Manes M.D.   On: 11/19/2021 14:32  ? ?CT HEAD WO CONTRAST (5MM) ? IMPRESSION: No acute abnormality noted. No significant change from the prior exam. Electronically Signed   By: Inez Catalina M.D.   On: 11/12/2021 19:14  ? ?CT CHEST WO CONTRAST ? ?Result Date: 11/19/2021 ? IMPRESSION: 1. Since the prior chest CT, numerous small ill-defined ground-glass nodular opacities have developed in both lungs. Multifocal infection is suspected. Findings could be inflammatory. 2. Bilateral lower lobe atelectasis, partly obscuring the right lower lobe cavitary consolidative process noted on the prior CT, which otherwise has not changed. Lung base opacities have significantly increased from the previous CT. New trace left pleural effusion. Small areas  of atelectasis or infection noted in the right middle lobe and left upper lobe lingula, also new. 3. Stable coronary artery calcifications and aortic atherosclerosis. 4. Prominent mediastinal lymph nodes increased from the prior CT, reactive. Aortic Atherosclerosis (ICD10-I70.0). Electronically Signed   By: Lajean Manes M.D.   On: 11/19/2021 14:43  ? ?CT Angio Chest PE W and/or Wo Contrast ? ?Result Date: 11/12/2021 ?IMPRESSION: CTA of the chest: No evidence of pulmonary emboli. Large mildly cavitary infiltrate in superior segment of right lower lobe consistent with acute pneumonia. Some minimal infiltrate is noted in the left base as well. Small bleb with air-fluid level within is noted in the medial aspect of the left base. These findings are all new from the prior exam. Resolution of previously seen effusions. 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 CT of the abdomen and pelvis: Postsurgical changes consistent with colostomy. Area of decreased attenuation and enhancement within the lateral segment of the left lobe of the liver with a somewhat mass like appearance along the margin of  the liver. Possible metastatic disease would deserve consideration. Status post right nephrectomy. Electronically Signed   By: Inez Catalina M.D.   On: 11/12/2021 19:33  ? ?MR BRAIN W WO CONTRAST ? ?Result Dat

## 2021-12-09 NOTE — TOC Progression Note (Signed)
Transition of Care (TOC) - Progression Note  ? ? ?Patient Details  ?Name: Kristen [facility] ?MRN: [id number] ?Date of Birth: 1965-11-09 ? ?Transition of Care (TOC) CM/SW Contact  ?Bartholomew Crews, RN ?Phone Number: [phone] ?12/09/2021, 9:23 AM ? ?Clinical Narrative:    ? ?Received call from [place] at [facility]. Family reached out last night and requested patient transition to inpatient bed at [facility]. Hospice needs referral. Morphine will need to be changed to fentanyl d/t national shortage. Noted palliative pending follow up today. TOC following for transition needs.  ? ?Expected Discharge Plan:  (trach snf) ?Barriers to Discharge: Continued Medical Work up ? ?Expected Discharge Plan and Services ?Expected Discharge Plan:  (trach snf) ?In-house Referral: Clinical Social Work ?Discharge Planning Services: CM Consult ?Post Acute Care Choice: [place] ?Living arrangements for the past 2 months: [place] ?                ?  ?  ?  ?  ?  ?  ?  ?  ?  ?  ? ? ?Social Determinants of Health (SDOH) Interventions ?  ? ?Readmission Risk Interventions ?Readmission Risk Prevention Plan 11/23/2021  ?Transportation Screening Complete  ?Medication Review Press photographer) Complete  ?SW Recovery Care/Counseling Consult Complete  ?Palliative Care Screening Complete  ?Some recent data might be hidden  ? ? ?

## 2021-12-09 NOTE — Progress Notes (Signed)
? ?                                                                                                                                                     ?                                                   ?Daily Progress Note  ? ?Patient Name: Kristen [facility]       Date: 12/09/2021 ?DOB: [birth year]  Age: 56 y.o. MRN#: [id number] ?Attending Physician: Kayleen Memos, DO ?Primary Care Physician: Gweneth Fritter, FNP ?Admit Date: 11/12/2021 ? ?Reason for Consultation/Follow-up: Disposition, Establishing goals of care, Non pain symptom management, Pain control, Psychosocial/spiritual support, and Terminal Care ? ?Subjective: ?Received notification from [facility] that family had reached out to [facility] requesting transfer. ? ?Chart review performed. Received report from primary RN - no acute concerns.  ? ?Went to visit patient at bedside - no family present. Patient was lying in bed - she does not wake to voice/gentle touch. No signs or non-verbal gestures of pain or discomfort noted. No respiratory distress, increased work of breathing, or secretions noted. Periods of apnea ranging from 8-15 seconds noted.  ? ?Called daughter/Christy - emotional support provided. Therapeutic listening provided as [name] states the "family feels as if they have been grieving for 27 days." Their goal at this time is for patient transfer to residential hospice home ([place]) closer to family. Discussed risk of patient passing during transfer - family are willing to take this risk. Reviewed symptom management plan to include pre-medicating prior to transfer - once patient arrives to hospice they would continue symptom management. [name] is agreeable for patient transfer - family are hopeful she can transfer today since they were told there is a bed available.  ? ?All questions and concerns addressed. Encouraged to call with questions and/or concerns. PMT number previously provided. ? ?Discussed transfer pre-medication  plan with RN. ? ?Coordinated transfer recommendations with TOC, who has been in contact with hospice liaison.  ? ?12:30 PM ?Received notification from [facility] that hospice is requesting morphine transitioned to fentanyl prior to discharge to ensure medication adjustment will manage patient's symptoms. Hospice reports they are not able to obtain morphine or dilaudid due to national shortages. Will transition morphine to fentanyl in house, monitor overnight, with plan to transfer to hospice tomorrow morning if symptoms are managed and patient remains stable for transfer. Hospice liaison reports discussing this with patient's family and they are agreeable. ? ?Length of Stay: 27 ? ?Current Medications: ?Scheduled Meds:  ? artificial tears   Both Eyes Q8H  ? mouth rinse  15 mL Mouth  Rinse 10 times per day  ? ? ?Continuous Infusions: ? sodium chloride 10 mL/hr at 12/09/21 0941  ? sodium chloride Stopped (11/26/21 1648)  ? lacosamide (VIMPAT) IV 100 mg (12/09/21 1015)  ? levETIRAcetam 1,500 mg (12/09/21 0943)  ? midazolam 1 mg/hr (12/09/21 0800)  ? morphine 15 mg/hr (12/09/21 0827)  ? ? ?PRN Meds: ?sodium chloride, [DISCONTINUED] acetaminophen **OR** acetaminophen (TYLENOL) oral liquid 160 mg/5 mL **OR** [DISCONTINUED] acetaminophen, diphenhydrAMINE, [DISCONTINUED] glycopyrrolate **OR** glycopyrrolate **OR** glycopyrrolate, lip balm, morphine, polyvinyl alcohol ? ?Physical Exam ?Vitals and nursing note reviewed.  ?Constitutional:   ?   General: She is not in acute distress. ?   Appearance: She is ill-appearing.  ?Pulmonary:  ?   Effort: No respiratory distress.  ?Skin: ?   General: Skin is warm and dry.  ?Neurological:  ?   Mental Status: She is unresponsive.  ?   Motor: Weakness present.  ?Psychiatric:     ?   Speech: She is noncommunicative.  ?         ? ?Vital Signs: BP 98/64 (BP Location: Right Arm)   Pulse (!) 109   Temp 99.1 ?F (37.3 ?C) (Axillary)   Resp 10   Ht '5\' 2"'$  (1.575 m)   Wt 70 kg   SpO2 (!) 61%   BMI  28.23 kg/m?  ?SpO2: SpO2: (!) 61 % ?O2 Device: O2 Device: Tracheostomy Collar ?O2 Flow Rate: O2 Flow Rate (L/min): 5 L/min ? ?Intake/output summary:  ?Intake/Output Summary (Last 24 hours) at 12/09/2021 1041 ?Last data filed at 12/09/2021 0800 ?Gross per 24 hour  ?Intake 1663.88 ml  ?Output 300 ml  ?Net 1363.88 ml  ? ?LBM: Last BM Date : 12/06/21 ?Baseline Weight: Weight: 75.6 kg ?Most recent weight: Weight: 70 kg ? ?     ?Palliative Assessment/Data: PPS 10% ? ? ? ? ? ?Patient Active Problem List  ? Diagnosis Date Noted  ? MRSA pneumonia ([place]) 11/15/2021  ? Brain abscess 11/15/2021  ? Cardiac arrest ([place]) 11/12/2021  ? Hematemesis   ? Respiratory failure ([place])   ? Sepsis ([place])   ? Stroke [facility])   ? Seizures ([place])   ? Hypertension   ? Heart murmur   ? Headache   ? Dyspnea   ? Diabetes mellitus without complication ([place])   ? Depression   ? Chronic kidney disease   ? Asthma   ? Coronary artery disease   ? Arthritis   ? Anxiety   ? Anemia   ? Cervical cancer ([place]) 01/26/2020  ? CAD in native artery 02/27/2019  ? Dyslipidemia (high LDL; low HDL) 02/27/2019  ? Non-ST elevation (NSTEMI) myocardial infarction ([place]) 02/18/2019  ? NSTEMI (non-ST elevated myocardial infarction) ([place]) 02/18/2019  ? HTN (hypertension) 02/20/2017  ? DM (diabetes mellitus) ([place]) 02/20/2017  ? Cerebrovascular disease 02/20/2017  ? Tobacco use disorder 02/20/2017  ? Opioid use disorder, severe, dependence ([place]) 02/20/2017  ? Sedative, hypnotic or anxiolytic use disorder, severe, dependence ([place]) 02/20/2017  ? Sedative hypnotic withdrawal ([place]) 02/20/2017  ? Methamphetamine use disorder, moderate ([place]) 02/20/2017  ? Alcohol use disorder, moderate, dependence ([place]) 02/20/2017  ? MDD (major depressive disorder) 02/19/2017  ? ? ?Palliative Care Assessment & Plan  ? ?Patient Profile: ?56 year old woman with a history of tobacco use, cervical cancer ([facility]), post nephrectomy and colostomy, CAD with LAD stent 01/2019, diabetes, depression, hypertension, remote  CVA without known deficits.  Also with a history of substance abuse.  Brought in by her son to the ED 2/19 with fever, altered  mental status and slurred speech. ? ?She had a flulike illness for a week, was found confused on 2/19.  Some suspicion by son that she may have been taking more prescription narcotics, also taking many NSAIDs per his report.  She was disoriented in the ED, febrile 103F, hyperglycemic. ?  ?In the ED she apparently experienced acute hypoxemia, had some blood from her mouth, question emesis versus hemoptysis.  Devolved to PEA and required emergent ET intubation and CPR.  ROSC after 2 minutes.  ? ?Assessment: ?Sepsis with brain abcess ?MRSA bacteremia ?MV endocarditis ?Acute hypoxemic respiratory failure ?Acute multifactorial encephalopathy ?Terminal care ? ?Recommendations/Plan: ?Continue full comfort measures ?Continue DNR/DNI as previously documented ?Hospice is not able to obtain morphine or dilaudid due to national shortages - request morphine transition to fentanyl and monitor overnight to ensure patient's symptoms are managed before transfer ?Discontinued morphine and started fentanyl drip with PRN bolus doses ?Transfer to residential hospice, [facility], tomorrow morning 3/19 if symptoms are managed on fentanyl drip and patient remains stable for transfer  ?Continue other current comfort focused medication regimen - patient appears comfortable ?PMT will continue to follow and support holistically ? ?Goals of Care and Additional Recommendations: ?Limitations on Scope of Treatment: Full Comfort Care ? ?Code Status: ? ?  ?Code Status Orders  ?(From admission, onward)  ?  ? ? ?  ? ?  Start     Ordered  ? 12/05/21 1218  DNR (Do not attempt resuscitation)  Continuous       ?Question Answer Comment  ?In the event of cardiac or respiratory ARREST Do not call a ?code blue?   ?In the event of cardiac or respiratory ARREST Do not perform Intubation, CPR, defibrillation or ACLS   ?In the  event of cardiac or respiratory ARREST Use medication by any route, position, wound care, and other measures to relive pain and suffering. May use oxygen, suction and manual treatment of airway obstruction as need

## 2021-12-10 DIAGNOSIS — R0902 Hypoxemia: Secondary | ICD-10-CM

## 2021-12-10 MED ORDER — FENTANYL BOLUS VIA INFUSION
100.0000 ug | Freq: Once | INTRAVENOUS | Status: AC
Start: 2021-12-10 — End: 2021-12-10
  Administered 2021-12-10: 100 ug via INTRAVENOUS
  Filled 2021-12-10: qty 100

## 2021-12-10 NOTE — Progress Notes (Signed)
Report called to Human resources officer at [facility] at this time. Transport via [facility] [facility] pending. Spoke with pt's daughter [name] regarding DC planning. ? ?Pt remains stable with even, unlabored breathing and no signs of distress or discomfort noted.  ?

## 2021-12-10 NOTE — Progress Notes (Signed)
[facility] at bedside for transport via gurney. Fentanyl 100 mcg bolus given per MAR. Per Palliative NP, no Versed bolus needed. ? ?The following medications were wasted and disposed of via stericycle waste bin with Malva Cogan, RN: ?Fentanyl 1,455mg/145ml ?Versed 37.'5mg'$ /37.573m?

## 2021-12-10 NOTE — Progress Notes (Signed)
? ?                                                                                                                                                     ?                                                   ?Daily Progress Note  ? ?Patient Name: Kristen Murphy       Date: 12/10/2021 ?DOB: 1966-09-06  Age: 56 y.o. MRN#: [id number] ?Attending Physician: Kayleen Memos, DO ?Primary Care Physician: Gweneth Fritter, FNP ?Admit Date: 11/12/2021 ? ?Reason for Consultation/Follow-up: Disposition, Non pain symptom management, Pain control, Psychosocial/spiritual support, and Terminal Care ? ?Subjective: ?Chart review performed. Received report from primary RN - no acute concerns. RN reports patient has remained comfortable on fentanyl drip overnight.  ? ?Went to visit patient at bedside - no family/visitors present. Patient is lying in bed - she does not wake to voice/gentle touch. No signs or non-verbal gestures of pain or discomfort noted. No respiratory distress, increased work of breathing, or secretions noted. 10 second period of apnea noted. ? ?Updated TOC that patient's symptoms have been managed with fentanyl drip. They will coordinate discharge with [facility]. ? ?Discussed transfer pre-medication with RN. ? ?Length of Stay: 28 ? ?Current Medications: ?Scheduled Meds:  ? antiseptic oral rinse  15 mL Mouth Rinse BID  ? artificial tears   Both Eyes Q8H  ? ? ?Continuous Infusions: ? fentaNYL infusion INTRAVENOUS 150 mcg/hr (12/10/21 0160)  ? lacosamide (VIMPAT) IV 100 mg (12/10/21 0913)  ? levETIRAcetam 1,500 mg (12/10/21 0728)  ? midazolam 1 mg/hr (12/09/21 1800)  ? ? ?PRN Meds: ?[DISCONTINUED] acetaminophen **OR** acetaminophen (TYLENOL) oral liquid 160 mg/5 mL **OR** [DISCONTINUED] acetaminophen, diphenhydrAMINE, fentaNYL, [DISCONTINUED] glycopyrrolate **OR** glycopyrrolate **OR** glycopyrrolate, lip balm, polyvinyl alcohol ? ?Physical Exam ?Vitals and nursing note reviewed.  ?Constitutional:   ?   General:  She is not in acute distress. ?   Appearance: She is ill-appearing.  ?Pulmonary:  ?   Effort: No respiratory distress.  ?Skin: ?   General: Skin is warm and dry.  ?Neurological:  ?   Mental Status: She is unresponsive.  ?   Motor: Weakness present.  ?Psychiatric:     ?   Speech: She is noncommunicative.  ?         ? ?Vital Signs: BP (!) 154/70 (BP Location: Left Arm)   Pulse (!) 108   Temp 99.3 ?F (37.4 ?C) (Axillary)   Resp 12   Ht '5\' 2"'$  (1.575 m)   Wt 70 kg   SpO2 (!) 85%   BMI 28.23 kg/m?  ?SpO2:  SpO2: (!) 85 % ?O2 Device: O2 Device: Tracheostomy Collar ?O2 Flow Rate: O2 Flow Rate (L/min): 5 L/min ? ?Intake/output summary:  ?Intake/Output Summary (Last 24 hours) at 12/10/2021 0949 ?Last data filed at 12/10/2021 0736 ?Gross per 24 hour  ?Intake 320.8 ml  ?Output 400 ml  ?Net -79.2 ml  ? ?LBM: Last BM Date : 12/06/21 ?Baseline Weight: Weight: 75.6 kg ?Most recent weight: Weight: 70 kg ? ?     ?Palliative Assessment/Data: PPS 10% ? ? ? ? ? ?Patient Active Problem List  ? Diagnosis Date Noted  ? MRSA pneumonia ([place]) 11/15/2021  ? Brain abscess 11/15/2021  ? Cardiac arrest ([place]) 11/12/2021  ? Hematemesis   ? Respiratory failure ([place])   ? Sepsis ([place])   ? Stroke [facility])   ? Seizures ([place])   ? Hypertension   ? Heart murmur   ? Headache   ? Dyspnea   ? Diabetes mellitus without complication ([place])   ? Depression   ? Chronic kidney disease   ? Asthma   ? Coronary artery disease   ? Arthritis   ? Anxiety   ? Anemia   ? Cervical cancer ([place]) 01/26/2020  ? CAD in native artery 02/27/2019  ? Dyslipidemia (high LDL; low HDL) 02/27/2019  ? Non-ST elevation (NSTEMI) myocardial infarction ([place]) 02/18/2019  ? NSTEMI (non-ST elevated myocardial infarction) ([place]) 02/18/2019  ? HTN (hypertension) 02/20/2017  ? DM (diabetes mellitus) ([place]) 02/20/2017  ? Cerebrovascular disease 02/20/2017  ? Tobacco use disorder 02/20/2017  ? Opioid use disorder, severe, dependence ([place]) 02/20/2017  ? Sedative, hypnotic or anxiolytic use disorder,  severe, dependence ([place]) 02/20/2017  ? Sedative hypnotic withdrawal ([place]) 02/20/2017  ? Methamphetamine use disorder, moderate ([place]) 02/20/2017  ? Alcohol use disorder, moderate, dependence ([place]) 02/20/2017  ? MDD (major depressive disorder) 02/19/2017  ? ? ?Palliative Care Assessment & Plan  ? ?Patient Profile: ?56 year old woman with a history of tobacco use, cervical cancer ([facility]), post nephrectomy and colostomy, CAD with LAD stent 01/2019, diabetes, depression, hypertension, remote CVA without known deficits.  Also with a history of substance abuse.  Brought in by her son to the ED 2/19 with fever, altered mental status and slurred speech. ? ?She had a flulike illness for a week, was found confused on 2/19.  Some suspicion by son that she may have been taking more prescription narcotics, also taking many NSAIDs per his report.  She was disoriented in the ED, febrile 103F, hyperglycemic. ?  ?In the ED she apparently experienced acute hypoxemia, had some blood from her mouth, question emesis versus hemoptysis.  Devolved to PEA and required emergent ET intubation and CPR.  ROSC after 2 minutes.  ? ?Assessment: ?Sepsis with brain abcess ?MRSA bacteremia ?MV endocarditis ?Acute hypoxemic respiratory failure ?Acute multifactorial encephalopathy ?Terminal care ? ?Recommendations/Plan: ?Continue full comfort measures ?Continue DNR/DNI as previously documented ?Discharge to [place] today - patient's symptoms have been managed on fentanyl drip and she has remained stable for transfer ?Care order instruction placed for pre-medication of fentanyl bolus dose prior to transfer ?Continue current comfort focused medication regimen until discharge ?PMT will continue to follow and support holistically ? ?Goals of Care and Additional Recommendations: ?Limitations on Scope of Treatment: Full Comfort Care ? ?Code Status: ? ?  ?Code Status Orders  ?(From admission, onward)  ?  ? ? ?  ? ?  Start     Ordered  ? 12/05/21  1218  DNR (Do not attempt resuscitation)  Continuous       ?  Question Answer Comment  ?In the event of cardiac or respiratory ARREST Do not call a ?code blue?   ?In the event of cardiac or respiratory ARREST Do not perform Intubation, CPR, defibrillation or ACLS   ?In the event of cardiac or respiratory ARREST Use medication by any route, position, wound care, and other measures to relive pain and suffering. May use oxygen, suction and manual treatment of airway obstruction as needed for comfort.   ?  ? 12/05/21 1218  ? ?  ?  ? ?  ? ?Code Status History   ? ? Date Active Date Inactive Code Status Order ID Comments User Context  ? 12/03/2021 1433 12/05/2021 1218 DNR [id number]  Rosezella Rumpf, NP Inpatient  ? 11/12/2021 1839 12/03/2021 1433 Full Code [id number]  Arnell Asal, NP ED  ? 02/18/2019 1927 02/20/2019 1547 Full Code [id number]  Darreld Mclean, PA-C Inpatient  ? 02/19/2017 2022 02/25/2017 1527 Full Code [id number]  Hildred Priest, MD Inpatient  ? ?  ? ? ?Prognosis: ? Hours - Days ? ?Discharge Planning: ?Hospice facility ? ?Care plan was discussed with primary RN, Dr. Nevada Crane, TOC ? ?Thank you for allowing the Palliative Medicine Team to assist in the care of this patient. ? ? ?Lin Landsman, NP ? ?Please contact Palliative Medicine Team phone at [phone] for questions and concerns.  ? ? ? ? ? ?

## 2021-12-10 NOTE — TOC Transition Note (Signed)
Transition of Care (TOC) - CM/SW Discharge Note ? ? ?Patient Details  ?Name: Kristen Murphy ?MRN: [id number] ?Date of Birth: [birth year] ? ?Transition of Care (TOC) CM/SW Contact:  ?Carolin Sicks, LCSWA ?Phone Number: ?12/10/2021, 10:43 AM ? ? ?Clinical Narrative:    ?Patient will Discharge To: [facility] ?Anticipated DC Date:December 10, 2021 ?Family Notified:yes, daughter Kristen Murphy ?Transport UQ:[facility] ? ? ?Per MD patient ready for DC to [facility] . RN, patient, patient's family, and facility notified of DC. Assessment, Fl2/Pas[place], and Discharge Summary sent to facility. RN given number for report [phone]). DC packet on chart. Ambulance transport requested for patient for 1:30pm ? ?CSW signing off. ? ?Reed Breech ?LCSWA ?[phone] ?  ? ? ?Final next level of care: [place] ([facility]) ?Barriers to Discharge: No Barriers Identified ? ? ?Patient Goals and CMS Choice ?  ?CMS Medicare.gov Compare Post Acute Care list provided to:: Patient Represenative (must comment) (Patients daughter Kristen Low) ?Choice offered to / list presented to : Adult Children (Patients daughter) ? ?Discharge Placement ?  ?           ?Patient chooses bed at:  ([facility]) ?Patient to be transferred to facility by: [facility] ?Name of family member notified: Kristen Murphy ?Patient and family notified of of transfer: 12/10/21 ? ?Discharge Plan and Services ?In-house Referral: Clinical Social Work ?Discharge Planning Services: CM Consult ?Post Acute Care Choice: [place]          ?  ?  ?  ?  ?  ?  ?  ?  ?  ?  ? ?Social Determinants of Health (SDOH) Interventions ?  ? ? ?Readmission Risk Interventions ?Readmission Risk Prevention Plan 11/23/2021  ?Transportation Screening Complete  ?Medication Review Press photographer) Complete  ?SW Recovery Care/Counseling Consult Complete  ?Palliative Care Screening Complete  ?Some recent data might be hidden   ? ? ? ? ? ?

## 2021-12-10 NOTE — TOC Transition Note (Signed)
Transition of Care (TOC) - CM/SW Discharge Note ? ? ?Patient Details  ?Name: Kristen Murphy ?MRN: [id number] ?Date of Birth: [birth year] ? ?Transition of Care (TOC) CM/SW Contact:  ?Bartholomew Crews, RN ?Phone Number: [phone] ?12/10/2021, 10:21 AM ? ? ?Clinical Narrative:    ? ?Received call from Lluveras at [facility]. Medication/pump have been ordered for patient and should be delivered to [facility] in [place] 1-1:30. Tharon Aquas has arranged for ambulance transport for 1:30 pick up with transport to [facility].  ? ?Final next level of care: [place] ?Barriers to Discharge: No Barriers Identified ? ? ?Patient Goals and CMS Choice ?  ?CMS Medicare.gov Compare Post Acute Care list provided to:: Patient Represenative (must comment) (Patients daughter [name]) ?Choice offered to / list presented to : Adult Children (Patients daughter) ? ?Discharge Placement ?  ?           ?  ?  ?  ?  ? ?Discharge Plan and Services ?In-house Referral: Clinical Social Work ?Discharge Planning Services: CM Consult ?Post Acute Care Choice: [place]          ?  ?  ?  ?  ?  ?  ?  ?  ?  ?  ? ?Social Determinants of Health (SDOH) Interventions ?  ? ? ?Readmission Risk Interventions ?Readmission Risk Prevention Plan 11/23/2021  ?Transportation Screening Complete  ?Medication Review Press photographer) Complete  ?SW Recovery Care/Counseling Consult Complete  ?Palliative Care Screening Complete  ?Some recent data might be hidden  ? ? ? ? ? ?

## 2021-12-10 NOTE — Discharge Summary (Signed)
?Discharge Summary ? ?Kristen Murphy ZOX:[id number] DOB: 1966/04/18 ? ?PCP: Gweneth Fritter, FNP ? ?Admit date: 11/12/2021 ?Discharge date: 12/10/2021 ? ?Time spent: 35 minutes. ? ?Recommendations for Outpatient Follow-up:  ?Continue hospice care. ? ?Discharge Diagnoses:  ?Active Hospital Problems  ? Diagnosis Date Noted  ? Brain abscess 11/15/2021  ? MRSA pneumonia ([place]) 11/15/2021  ? Cardiac arrest ([place]) 11/12/2021  ? Sepsis ([place])   ? Respiratory failure ([place])   ? Diabetes mellitus without complication ([place])   ? Cervical cancer ([place]) 01/26/2020  ?  ?Resolved Hospital Problems  ?No resolved problems to display.  ? ? ? ?Vitals:  ? 12/10/21 0342 12/10/21 0404  ?BP:    ?Pulse:    ?Resp: 12   ?Temp:  99.3 ?F (37.4 ?C)  ?SpO2:    ? ? ?History of present illness:  ? Per admission history by PCCM on 11/12/21 ?Patient is a 56 year old female with history of tobacco use, cervical CA, post nephrectomy and colostomy, CAD with LAD stenting 01/2019, type 2 diabetes, depression, hypertension, remote CVA, substance use, was brought in by her son to the ED on 2/19 with fever, altered mental status and slurred speech. ?Apparently, she had a flulike illness for a week, was found confused on 11/12/21.  There was some suspicion by son that she may have been taking more prescription narcotics, many NSAIDs.  She was disoriented in the ED, febrile 103 ?F, hyperglycemic.  In ED, she also experienced acute hypoxemia, had some blood from her mouth, emesis versus hemoptysis.  Reported PEA and required emergent ED intubation and CPR.  ROSC after 2 minutes, OG tube placed with bright red blood obtained.  Chest x-ray with mild right hilar prominence, more notable on postintubation films, no infiltrates or effusions. ?Received empiric antibiotics, packed RBCs transfusion and admitted to ICU. ?2/19 Intubated. Head CT  > no acute CT findings, old left basal ganglia lacunar, old left frontal cortical and subcortical infarct ?MRI brain 2/21 numerous  lesions with mild associated hemorrhage and edema. Neurosurgery reviewed imaging and patient not a good candidate for lesion aspiration. ?2/24 MR Brain - Numerous lesions with mild associated hemorrhage. Mild edema. Concerned for brain abscesses ?2/22 LP consistent with bacterial meningitis. Respiratory Cx +MRSA ?2/25 MR Liver with no lesions, hypoperfusion to the subcapsular right hepatic lobe, favor benign vascular phenomena, distended gallbladder, persistent region of hypoperfusion. EEG and LTM 2/25 neg for seizures.  ?Repeat head CT 2/27 with no acute changes  ?2/28 No acute events overnight, LTM read ? Seizure.  Cefepime switched to meropenem. ?3/2 LTM stopped. Meropenem changed to CTX. TEE + mobile mass on MV w/ mild to mod MR EF 60-65% ?3/3 change to IV heparin for possible trach  ?3/4 trach placed ?3/5 ? Seizure activity, LTM restarted, weaned 8 hrs  ?3/6 remains on LTM- no seizure to correlate with episodes of HR 130s, jaw clenching, urinary incontinence.  Linezolid changed to vanc per ID for longterm therapy, weaned x 8 hours, diuresing ?3/7 hypoglycemic overnight w/ held TF, PEG placed per IR, off propofol, LTM d/c'd, weaned x 8 hrs 8/8, 50% ?3/9: Patient transferred to [facility], assumed care  ?3/14: Patient transitioned to comfort care ?3/18: Family requests transfer to hospice residence, possible discharge on 12/10/2021. ?3/19: Patient was seen and examined at bedside.  Unresponsive. ?  ?  ? ?Hospital Course:  ?Principal Problem: ?  Brain abscess ?Active Problems: ?  Cervical cancer ([place]) ?  Diabetes mellitus without complication ([place]) ?  Cardiac arrest [facility]) ?  Respiratory failure ([place]) ?  Sepsis ([place]) ?  MRSA pneumonia ([place]) ? ?Sepsis with brain abscess, MRSA bacteremia, MV endocarditis, pulmonary involvement. ?  ?Acute hypoxemic respiratory failure ?  ?Acute multifactorial encephalopathy ?  ?PICC associated upper extremity DVT bilaterally ?  ?Diabetes mellitus, type II ?  ?Essential hypertension ?   ?DNR/comfort care ?  ?  ?Code Status: DNR ?DVT Prophylaxis:    Comfort care measures. ?  ?  ?Procedures:  ?Mechanical ventilation ?PEG tube placement ?  ?Consultants:   ?Neurology ?Critical care ?Interventional radiology ?Palliative medicine ?  ?Antimicrobials:  ?IV vancomycin ?IV ceftriaxone ?  ? ?Discharge Exam: ?BP (!) 154/70 (BP Location: Left Arm)   Pulse (!) 108   Temp 99.3 ?F (37.4 ?C) (Axillary)   Resp 12   Ht '5\' 2"'$  (1.575 m)   Wt 70 kg   SpO2 (!) 85%   BMI 28.23 kg/m?  ?General: 56 y.o. year-old female frail-appearing.  Unresponsive. ?Respiratory: Shallow breath. ?Abdomen: Hypoactive bowel sounds.   ?Psychiatry: Unresponsive. ? ?Discharge Instructions ?You were cared for by a hospitalist during your hospital stay. If you have any questions about your discharge medications or the care you received while you were in the hospital after you are discharged, you can call the unit and asked to speak with the hospitalist on call if the hospitalist that took care of you is not available. Once you are discharged, your primary care physician will handle any further medical issues. Please note that NO REFILLS for any discharge medications will be authorized once you are discharged, as it is imperative that you return to your primary care physician (or establish a relationship with a primary care physician if you do not have one) for your aftercare needs so that they can reassess your need for medications and monitor your lab values. ? ? ?Allergies as of 12/10/2021   ? ?   Reactions  ? Glimepiride Anaphylaxis  ? Has sulfa in it per pharmacy  ? Sulfa Antibiotics Anaphylaxis, Hives  ? Effexor [venlafaxine] Hives  ? ?  ? ?  ?Medication List  ?  ? ?STOP taking these medications   ? ?albuterol 108 (90 Base) MCG/ACT inhaler ?Commonly known as: VENTOLIN HFA ?  ?ALPRAZolam 1 MG tablet ?Commonly known as: XANAX ?  ?amLODipine 10 MG tablet ?Commonly known as: NORVASC ?  ?aspirin 81 MG EC tablet ?  ?atorvastatin 80 MG  tablet ?Commonly known as: LIPITOR ?  ?Farxiga 10 MG Tabs tablet ?Generic drug: dapagliflozin propanediol ?  ?gabapentin 800 MG tablet ?Commonly known as: NEURONTIN ?  ?lisinopril 40 MG tablet ?Commonly known as: ZESTRIL ?  ?metFORMIN 1000 MG tablet ?Commonly known as: GLUCOPHAGE ?  ?metoprolol succinate 50 MG 24 hr tablet ?Commonly known as: TOPROL-XL ?  ?metoprolol tartrate 50 MG tablet ?Commonly known as: LOPRESSOR ?  ?nitroGLYCERIN 0.4 MG SL tablet ?Commonly known as: NITROSTAT ?  ?pantoprazole 40 MG tablet ?Commonly known as: PROTONIX ?  ?prochlorperazine 10 MG tablet ?Commonly known as: COMPAZINE ?  ?sitaGLIPtin 50 MG tablet ?Commonly known as: JANUVIA ?  ?VITAMIN B-12 PO ?  ? ?  ? ?Allergies  ?Allergen Reactions  ? Glimepiride Anaphylaxis  ?  Has sulfa in it per pharmacy  ? Sulfa Antibiotics Anaphylaxis and Hives  ? Effexor [Venlafaxine] Hives  ? ? ? ? ?The results of significant diagnostics from this hospitalization (including imaging, microbiology, ancillary and laboratory) are listed below for reference.   ? ?Significant Diagnostic Studies: ?CT HEAD WO CONTRAST (5MM) ? ?Result  Date: 11/20/2021 ?CLINICAL DATA:  Mental status change with unknown cause. None reactive left pupil EXAM: CT HEAD WITHOUT CONTRAST TECHNIQUE: Contiguous axial images were obtained from the base of the skull through the vertex without intravenous contrast. RADIATION DOSE REDUCTION: This exam was performed according to the departmental dose-optimization program which includes automated exposure control, adjustment of the mA and/or kV according to patient size and/or use of iterative reconstruction technique. COMPARISON:  Head CT from yesterday FINDINGS: Brain: Numerous brain lesions scattered in the brain, underestimated relative to prior MRI. These have a similar distribution when compared to prior CT. Recent right parietooccipital infarcts without detected progression. Remote high left frontal infarct with dense gliosis. No acute  hemorrhage, hydrocephalus, or collection. No herniation. Vascular: Atheromatous calcification. Skull: Normal. Negative for fracture or focal lesion. Sinuses/Orbits: Patchy sinus opacification and bilateral mastoid opacification i

## 2021-12-23 DEATH — deceased

## 2021-12-28 LAB — ACID FAST CULTURE WITH REFLEXED SENSITIVITIES (MYCOBACTERIA): Acid Fast Culture: NEGATIVE

## 2022-05-29 IMAGING — DX DG CHEST 1V PORT
1 series · 1 of 1 positions shown · non-contrast
Comparison: 11/27/2021 portable chest.

CLINICAL DATA: Follow-up left lower lobe infiltrate.

EXAM:
PORTABLE CHEST 1 VIEW

[chest]
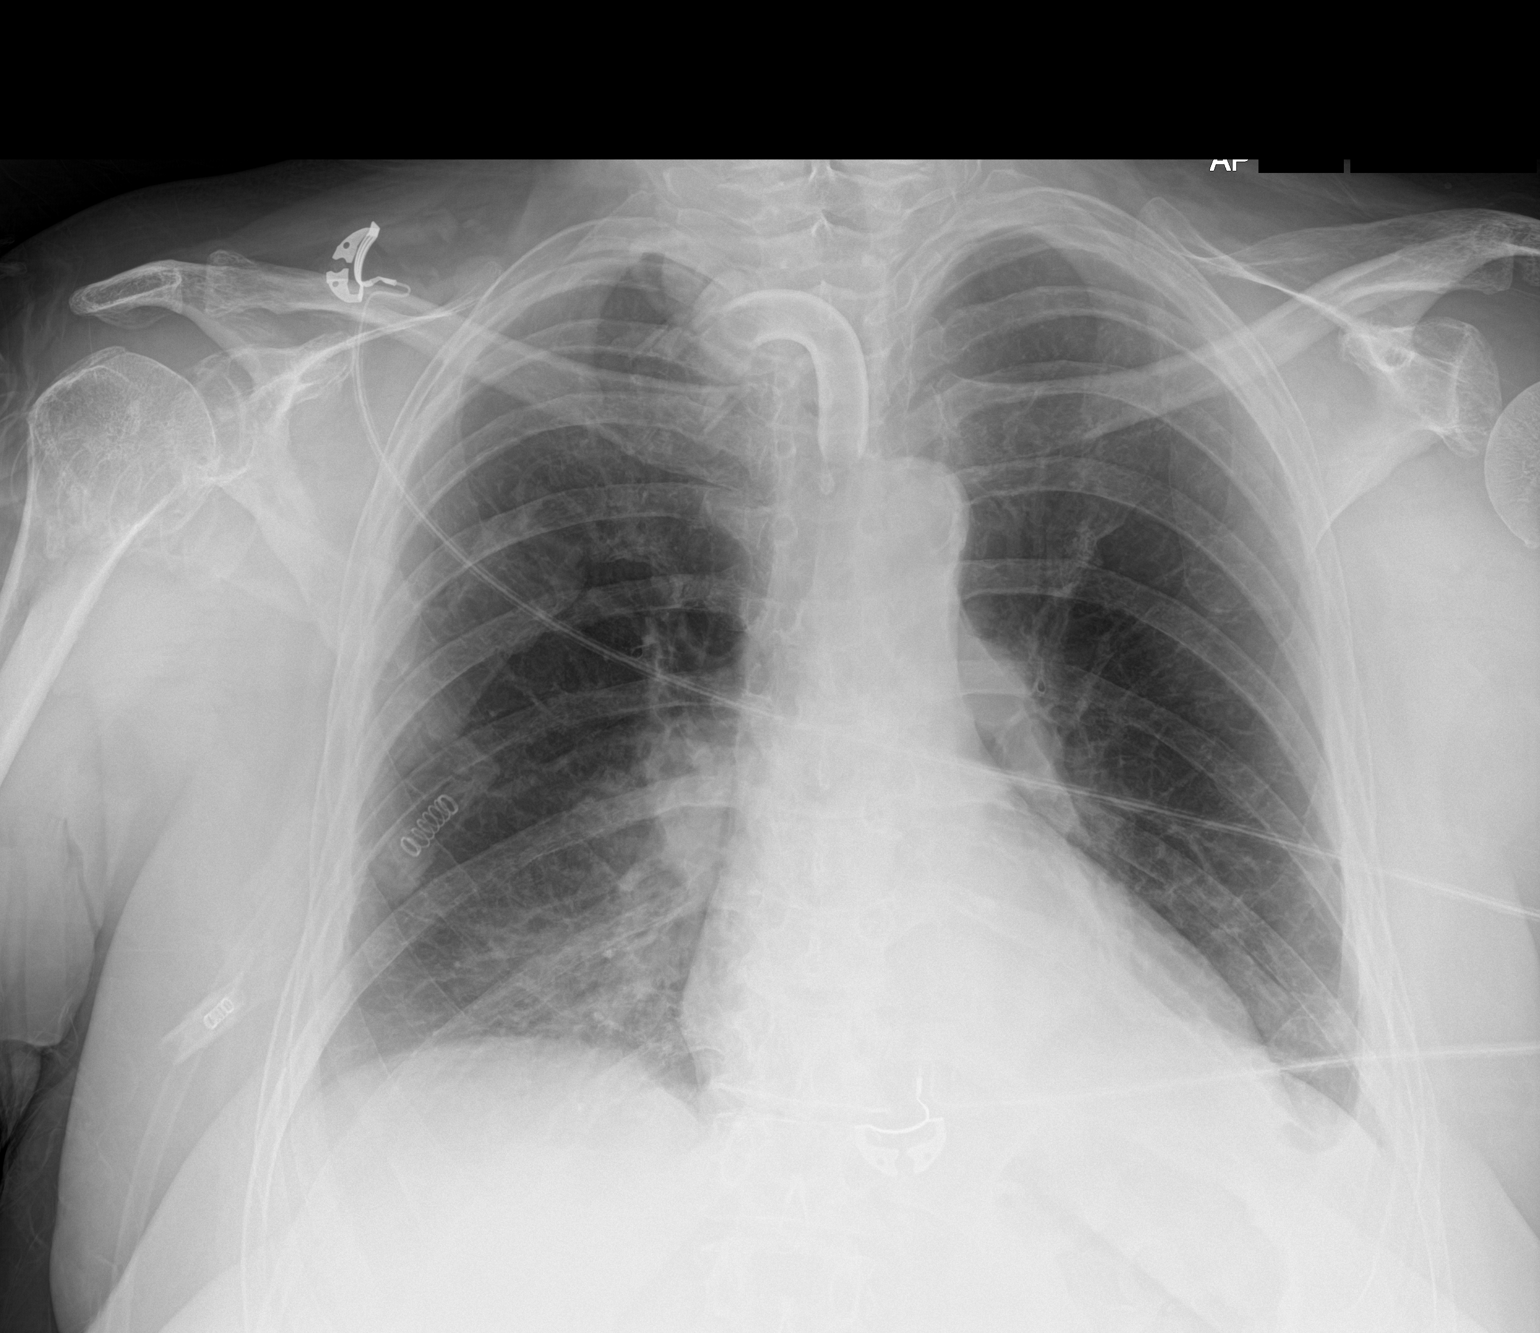

[1 of 1 positions shown; findings below may reference images not displayed]

FINDINGS: Interval removal enteric tube. Tracheostomy cannula positioning is
unaltered.

Cardiac size is normal. The mediastinum is stable, with aortic
atherosclerosis.

There is increased left infrahilar opacity consistent with left
lower lobe consolidation or atelectasis. A small left pleural
effusion is unchanged.

On the right there is no interval change in infrahilar hazy
interstitial consolidation.

The remaining lungs clear.  The thoracic cage intact.
IMPRESSION: 1. Increased left lower lobe opacity consistent with atelectasis or
consolidation. Small stable left pleural effusion.
2. Unchanged right infrahilar opacity.
